# Patient Record
Sex: Male | Born: 1965 | Race: White | Hispanic: No | Marital: Married | Smoking: Current some day smoker
Health system: Southern US, Community
[De-identification: ages and names within clinical notes are randomized; demographics above are authoritative.]

## PROBLEM LIST (undated history)

## (undated) DIAGNOSIS — K759 Inflammatory liver disease, unspecified: Secondary | ICD-10-CM

## (undated) DIAGNOSIS — D709 Neutropenia, unspecified: Secondary | ICD-10-CM

## (undated) DIAGNOSIS — D693 Immune thrombocytopenic purpura: Secondary | ICD-10-CM

## (undated) DIAGNOSIS — K7460 Unspecified cirrhosis of liver: Secondary | ICD-10-CM

## (undated) DIAGNOSIS — E119 Type 2 diabetes mellitus without complications: Secondary | ICD-10-CM

## (undated) HISTORY — PX: LITHOTRIPSY: SUR834

---

## 1999-08-11 ENCOUNTER — Emergency Department (HOSPITAL_COMMUNITY): Admission: EM | Admit: 1999-08-11 | Discharge: 1999-08-11 | Payer: Self-pay | Admitting: Family Medicine

## 2000-01-25 ENCOUNTER — Emergency Department (HOSPITAL_COMMUNITY): Admission: EM | Admit: 2000-01-25 | Discharge: 2000-01-26 | Payer: Self-pay | Admitting: Emergency Medicine

## 2000-01-25 ENCOUNTER — Encounter: Payer: Self-pay | Admitting: Emergency Medicine

## 2004-05-16 ENCOUNTER — Emergency Department (HOSPITAL_COMMUNITY): Admission: EM | Admit: 2004-05-16 | Discharge: 2004-05-16 | Payer: Self-pay | Admitting: Emergency Medicine

## 2014-11-02 DIAGNOSIS — N200 Calculus of kidney: Secondary | ICD-10-CM | POA: Insufficient documentation

## 2016-05-01 ENCOUNTER — Encounter (HOSPITAL_COMMUNITY): Payer: Self-pay

## 2016-05-01 ENCOUNTER — Emergency Department (HOSPITAL_COMMUNITY): Payer: Medicaid Other

## 2016-05-01 ENCOUNTER — Emergency Department (EMERGENCY_DEPARTMENT_HOSPITAL)
Admission: EM | Admit: 2016-05-01 | Discharge: 2016-05-02 | Disposition: A | Discharge status: Home / Self Care | Payer: Medicaid Other | Source: Home / Self Care | Attending: Emergency Medicine | Admitting: Emergency Medicine

## 2016-05-01 DIAGNOSIS — F17200 Nicotine dependence, unspecified, uncomplicated: Secondary | ICD-10-CM

## 2016-05-01 DIAGNOSIS — Z79899 Other long term (current) drug therapy: Secondary | ICD-10-CM | POA: Diagnosis not present

## 2016-05-01 DIAGNOSIS — F1123 Opioid dependence with withdrawal: Secondary | ICD-10-CM | POA: Insufficient documentation

## 2016-05-01 DIAGNOSIS — R109 Unspecified abdominal pain: Secondary | ICD-10-CM | POA: Insufficient documentation

## 2016-05-01 DIAGNOSIS — E119 Type 2 diabetes mellitus without complications: Secondary | ICD-10-CM | POA: Diagnosis not present

## 2016-05-01 DIAGNOSIS — R443 Hallucinations, unspecified: Secondary | ICD-10-CM

## 2016-05-01 DIAGNOSIS — Z7984 Long term (current) use of oral hypoglycemic drugs: Secondary | ICD-10-CM | POA: Diagnosis not present

## 2016-05-01 DIAGNOSIS — F1120 Opioid dependence, uncomplicated: Secondary | ICD-10-CM | POA: Diagnosis present

## 2016-05-01 DIAGNOSIS — Z794 Long term (current) use of insulin: Secondary | ICD-10-CM | POA: Diagnosis not present

## 2016-05-01 DIAGNOSIS — R748 Abnormal levels of other serum enzymes: Secondary | ICD-10-CM | POA: Insufficient documentation

## 2016-05-01 DIAGNOSIS — D696 Thrombocytopenia, unspecified: Secondary | ICD-10-CM | POA: Diagnosis not present

## 2016-05-01 DIAGNOSIS — F1920 Other psychoactive substance dependence, uncomplicated: Secondary | ICD-10-CM | POA: Diagnosis present

## 2016-05-01 DIAGNOSIS — F19951 Other psychoactive substance use, unspecified with psychoactive substance-induced psychotic disorder with hallucinations: Secondary | ICD-10-CM | POA: Diagnosis present

## 2016-05-01 HISTORY — DX: Type 2 diabetes mellitus without complications: E11.9

## 2016-05-01 HISTORY — DX: Unspecified cirrhosis of liver: K74.60

## 2016-05-01 LAB — COMPREHENSIVE METABOLIC PANEL
ALBUMIN: 4.8 g/dL (ref 3.5–5.0)
ALT: 38 U/L (ref 17–63)
AST: 44 U/L — AB (ref 15–41)
Alkaline Phosphatase: 95 U/L (ref 38–126)
Anion gap: 12 (ref 5–15)
BILIRUBIN TOTAL: 1.7 mg/dL — AB (ref 0.3–1.2)
BUN: 22 mg/dL — AB (ref 6–20)
CHLORIDE: 104 mmol/L (ref 101–111)
CO2: 23 mmol/L (ref 22–32)
Calcium: 9.7 mg/dL (ref 8.9–10.3)
Creatinine, Ser: 0.86 mg/dL (ref 0.61–1.24)
GFR calc Af Amer: >60 mL/min (ref >60)
GFR calc non Af Amer: >60 mL/min (ref >60)
GLUCOSE: 99 mg/dL (ref 65–99)
POTASSIUM: 4 mmol/L (ref 3.5–5.1)
Sodium: 139 mmol/L (ref 135–145)
Total Protein: 8.7 g/dL — ABNORMAL HIGH (ref 6.5–8.1)

## 2016-05-01 LAB — CBC
HEMATOCRIT: 36.4 % — AB (ref 39.0–52.0)
Hemoglobin: 12.7 g/dL — ABNORMAL LOW (ref 13.0–17.0)
MCH: 30.8 pg (ref 26.0–34.0)
MCHC: 34.9 g/dL (ref 30.0–36.0)
MCV: 88.3 fL (ref 78.0–100.0)
Platelets: 81 10*3/uL — ABNORMAL LOW (ref 150–400)
RBC: 4.12 MIL/uL — AB (ref 4.22–5.81)
RDW: 13.8 % (ref 11.5–15.5)
WBC: 8.4 10*3/uL (ref 4.0–10.5)

## 2016-05-01 LAB — RAPID URINE DRUG SCREEN, HOSP PERFORMED
AMPHETAMINES: POSITIVE — AB
BARBITURATES: NOT DETECTED
BENZODIAZEPINES: POSITIVE — AB
Cocaine: POSITIVE — AB
Opiates: POSITIVE — AB
TETRAHYDROCANNABINOL: POSITIVE — AB

## 2016-05-01 LAB — ACETAMINOPHEN LEVEL: Acetaminophen (Tylenol), Serum: <10 ug/mL — ABNORMAL LOW (ref 10–30)

## 2016-05-01 LAB — ETHANOL: Alcohol, Ethyl (B): <5 mg/dL (ref <5)

## 2016-05-01 LAB — SALICYLATE LEVEL: Salicylate Lvl: <4 mg/dL (ref 2.8–30.0)

## 2016-05-01 LAB — CBG MONITORING, ED: GLUCOSE-CAPILLARY: 132 mg/dL — AB (ref 65–99)

## 2016-05-01 MED ORDER — LORAZEPAM 1 MG PO TABS
1.0000 mg | ORAL_TABLET | Freq: Once | ORAL | Status: AC
  Administered 2016-05-01: 1 mg via ORAL
  Filled 2016-05-01: qty 1

## 2016-05-01 MED ORDER — IBUPROFEN 200 MG PO TABS
400.0000 mg | ORAL_TABLET | Freq: Once | ORAL | Status: AC
  Administered 2016-05-01: 400 mg via ORAL
  Filled 2016-05-01: qty 2

## 2016-05-01 MED ORDER — INSULIN GLARGINE 100 UNIT/ML ~~LOC~~ SOLN
15.0000 [IU] | Freq: Every day | SUBCUTANEOUS | Status: DC
  Filled 2016-05-01: qty 0.15

## 2016-05-01 MED ORDER — METFORMIN HCL 500 MG PO TABS
1000.0000 mg | ORAL_TABLET | Freq: Two times a day (BID) | ORAL | Status: DC
  Filled 2016-05-01 (×2): qty 2

## 2016-05-01 MED ORDER — ONDANSETRON HCL 4 MG PO TABS
4.0000 mg | ORAL_TABLET | Freq: Once | ORAL | Status: DC
Start: 2016-05-01 — End: 2016-05-02

## 2016-05-01 MED ORDER — BUPROPION HCL 75 MG PO TABS
75.0000 mg | ORAL_TABLET | Freq: Two times a day (BID) | ORAL | Status: DC
Start: 2016-05-01 — End: 2016-05-02
  Administered 2016-05-01 – 2016-05-02 (×2): 75 mg via ORAL
  Filled 2016-05-01 (×2): qty 1

## 2016-05-01 MED ORDER — GABAPENTIN 300 MG PO CAPS: 300.0000 mg | ORAL_CAPSULE | Freq: Every day | ORAL | Status: DC

## 2016-05-01 MED ORDER — ATOMOXETINE HCL 18 MG PO CAPS: 36.0000 mg | ORAL_CAPSULE | Freq: Every day | ORAL | Status: DC

## 2016-05-01 MED ORDER — ATOMOXETINE HCL 18 MG PO CAPS: 18.0000 mg | ORAL_CAPSULE | ORAL | Status: DC

## 2016-05-01 MED ORDER — INSULIN ASPART 100 UNIT/ML ~~LOC~~ SOLN: 2.0000 [IU] | SUBCUTANEOUS | Status: DC

## 2016-05-01 MED ORDER — ATOMOXETINE HCL 18 MG PO CAPS
18.0000 mg | ORAL_CAPSULE | Freq: Every day | ORAL | Status: DC
  Filled 2016-05-01 (×2): qty 1

## 2016-05-01 MED ORDER — ATORVASTATIN CALCIUM 10 MG PO TABS
10.0000 mg | ORAL_TABLET | Freq: Every day | ORAL | Status: DC
  Administered 2016-05-01 – 2016-05-02 (×2): 10 mg via ORAL
  Filled 2016-05-01 (×2): qty 1

## 2016-05-01 MED ORDER — METHOCARBAMOL 500 MG PO TABS
1000.0000 mg | ORAL_TABLET | Freq: Once | ORAL | Status: AC
  Administered 2016-05-01: 1000 mg via ORAL
  Filled 2016-05-01: qty 2

## 2016-05-01 MED ORDER — LINAGLIPTIN 5 MG PO TABS
5.0000 mg | ORAL_TABLET | Freq: Every day | ORAL | Status: DC
  Administered 2016-05-01: 5 mg via ORAL
  Filled 2016-05-01 (×2): qty 1

## 2016-05-01 MED ORDER — DOXEPIN HCL 10 MG PO CAPS
10.0000 mg | ORAL_CAPSULE | Freq: Every day | ORAL | Status: DC
  Administered 2016-05-01: 10 mg via ORAL
  Filled 2016-05-01: qty 1

## 2016-05-01 MED ORDER — METHADONE HCL 10 MG/ML PO CONC
135.0000 mg | Freq: Every day | ORAL | Status: DC
Start: 2016-05-01 — End: 2016-05-01

## 2016-05-01 NOTE — ED Notes (Signed)
Bed: WTR5 Expected date:  Expected time:  Means of arrival:  Comments: EMS-missed methadone-hallucinating
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
## 2016-05-01 NOTE — ED Notes (Signed)
TTS at bedside. 
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
## 2016-05-01 NOTE — ED Notes (Signed)
Pt ambulatory from triage w/o difficulty to room 37
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
## 2016-05-02 ENCOUNTER — Emergency Department (HOSPITAL_COMMUNITY): Payer: Medicaid Other

## 2016-05-02 ENCOUNTER — Encounter (HOSPITAL_COMMUNITY): Payer: Self-pay | Admitting: Emergency Medicine

## 2016-05-02 ENCOUNTER — Emergency Department (HOSPITAL_COMMUNITY)
Admission: EM | Admit: 2016-05-02 | Discharge: 2016-05-03 | Disposition: A | Discharge status: Home / Self Care | Payer: Medicaid Other | Attending: Emergency Medicine | Admitting: Emergency Medicine

## 2016-05-02 DIAGNOSIS — F19951 Other psychoactive substance use, unspecified with psychoactive substance-induced psychotic disorder with hallucinations: Secondary | ICD-10-CM | POA: Diagnosis not present

## 2016-05-02 DIAGNOSIS — F1120 Opioid dependence, uncomplicated: Secondary | ICD-10-CM | POA: Diagnosis present

## 2016-05-02 DIAGNOSIS — F1920 Other psychoactive substance dependence, uncomplicated: Secondary | ICD-10-CM | POA: Diagnosis not present

## 2016-05-02 DIAGNOSIS — F1193 Opioid use, unspecified with withdrawal: Secondary | ICD-10-CM

## 2016-05-02 DIAGNOSIS — F1123 Opioid dependence with withdrawal: Secondary | ICD-10-CM

## 2016-05-02 DIAGNOSIS — R748 Abnormal levels of other serum enzymes: Secondary | ICD-10-CM | POA: Diagnosis present

## 2016-05-02 DIAGNOSIS — D696 Thrombocytopenia, unspecified: Secondary | ICD-10-CM

## 2016-05-02 LAB — COMPREHENSIVE METABOLIC PANEL
ALBUMIN: 4.5 g/dL (ref 3.5–5.0)
ALT: 33 U/L (ref 17–63)
ANION GAP: 11 (ref 5–15)
AST: 43 U/L — ABNORMAL HIGH (ref 15–41)
Alkaline Phosphatase: 88 U/L (ref 38–126)
BILIRUBIN TOTAL: 1.2 mg/dL (ref 0.3–1.2)
BUN: 20 mg/dL (ref 6–20)
CO2: 23 mmol/L (ref 22–32)
Calcium: 9.3 mg/dL (ref 8.9–10.3)
Chloride: 102 mmol/L (ref 101–111)
Creatinine, Ser: 0.71 mg/dL (ref 0.61–1.24)
GFR calc Af Amer: >60 mL/min (ref >60)
GFR calc non Af Amer: >60 mL/min (ref >60)
GLUCOSE: 132 mg/dL — AB (ref 65–99)
POTASSIUM: 3.7 mmol/L (ref 3.5–5.1)
SODIUM: 136 mmol/L (ref 135–145)
TOTAL PROTEIN: 8.1 g/dL (ref 6.5–8.1)

## 2016-05-02 LAB — CBC WITH DIFFERENTIAL/PLATELET
BASOS ABS: 0 10*3/uL (ref 0.0–0.1)
BASOS PCT: 0 %
Eosinophils Absolute: 0 10*3/uL (ref 0.0–0.7)
Eosinophils Relative: 0 %
HEMATOCRIT: 35.9 % — AB (ref 39.0–52.0)
HEMOGLOBIN: 12.6 g/dL — AB (ref 13.0–17.0)
Lymphocytes Relative: 11 %
Lymphs Abs: 0.7 10*3/uL (ref 0.7–4.0)
MCH: 30.5 pg (ref 26.0–34.0)
MCHC: 35.1 g/dL (ref 30.0–36.0)
MCV: 86.9 fL (ref 78.0–100.0)
MONOS PCT: 4 %
Monocytes Absolute: 0.2 10*3/uL (ref 0.1–1.0)
NEUTROS ABS: 5.8 10*3/uL (ref 1.7–7.7)
NEUTROS PCT: 86 %
Platelets: 49 10*3/uL — ABNORMAL LOW (ref 150–400)
RBC: 4.13 MIL/uL — ABNORMAL LOW (ref 4.22–5.81)
RDW: 13.4 % (ref 11.5–15.5)
WBC: 6.8 10*3/uL (ref 4.0–10.5)

## 2016-05-02 LAB — CBG MONITORING, ED
GLUCOSE-CAPILLARY: 93 mg/dL (ref 65–99)
GLUCOSE-CAPILLARY: 139 mg/dL — AB (ref 65–99)

## 2016-05-02 LAB — LIPASE, BLOOD: Lipase: 905 U/L — ABNORMAL HIGH (ref 11–51)

## 2016-05-02 MED ORDER — ONDANSETRON 4 MG PO TBDP
4.0000 mg | ORAL_TABLET | Freq: Once | ORAL | Status: AC
  Administered 2016-05-02: 4 mg via ORAL
  Filled 2016-05-02: qty 1

## 2016-05-02 MED ORDER — SITAGLIPTIN PHOSPHATE 100 MG PO TABS: 100.0000 mg | ORAL_TABLET | Freq: Every day | ORAL | Status: DC

## 2016-05-02 MED ORDER — DICYCLOMINE HCL 10 MG PO CAPS
10.0000 mg | ORAL_CAPSULE | Freq: Once | ORAL | Status: AC
  Administered 2016-05-02: 10 mg via ORAL
  Filled 2016-05-02: qty 1

## 2016-05-02 MED ORDER — INSULIN GLARGINE 100 UNIT/ML ~~LOC~~ SOLN: 15.0000 [IU] | Freq: Every day | SUBCUTANEOUS | Status: DC

## 2016-05-02 MED ORDER — GABAPENTIN 300 MG PO CAPS: 300.0000 mg | ORAL_CAPSULE | Freq: Every day | ORAL | Status: DC

## 2016-05-02 MED ORDER — ATORVASTATIN CALCIUM 10 MG PO TABS: 10.0000 mg | ORAL_TABLET | Freq: Every day | ORAL | Status: DC

## 2016-05-02 MED ORDER — IOPAMIDOL (ISOVUE-300) INJECTION 61%
100.0000 mL | Freq: Once | INTRAVENOUS | Status: AC | PRN
  Administered 2016-05-02: 100 mL via INTRAVENOUS

## 2016-05-02 MED ORDER — METFORMIN HCL 1000 MG PO TABS: 1000.0000 mg | ORAL_TABLET | Freq: Two times a day (BID) | ORAL | Status: DC

## 2016-05-02 MED ORDER — BUPROPION HCL 100 MG PO TABS: 100.0000 mg | ORAL_TABLET | Freq: Every day | ORAL | Status: DC

## 2016-05-02 MED ORDER — SODIUM CHLORIDE 0.9 % IV BOLUS (SEPSIS)
1000.0000 mL | Freq: Once | INTRAVENOUS | Status: AC
  Administered 2016-05-02: 1000 mL via INTRAVENOUS

## 2016-05-02 MED ORDER — BUPROPION HCL 75 MG PO TABS: 75.0000 mg | ORAL_TABLET | Freq: Two times a day (BID) | ORAL | Status: DC

## 2016-05-02 MED ORDER — METHADONE HCL 10 MG/ML PO CONC: 135.0000 mg | Freq: Every day | ORAL | Status: DC

## 2016-05-02 MED ORDER — INSULIN ASPART 100 UNIT/ML ~~LOC~~ SOLN: 2.0000 [IU] | SUBCUTANEOUS | Status: DC

## 2016-05-02 MED ORDER — DOXEPIN HCL 10 MG PO CAPS: 10.0000 mg | ORAL_CAPSULE | Freq: Every day | ORAL | Status: DC

## 2016-05-02 NOTE — ED Notes (Signed)
Pt was given Mal[place]key sandwich and sprite for po challenge.
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
## 2016-05-02 NOTE — ED Provider Notes (Signed)
CSN: [id number]     Arrival date & time 05/02/16  1839 History   First MD Initiated Contact with Patient 05/02/16 1956     Chief Complaint  Patient presents with  . Detox      The history is provided by the patient. No language interpreter was used.   Chase Reed is a 50 y.o. male who presents to the Emergency Department complaining of R[place]. He has a history of taking methadone. His last dose was 3 days ago and he now is in withdrawals. He reports vomiting, abdominal cramping, diarrhea. He tried suboxone today and that made his symptoms worse. He denies any fevers. Denies any SI, HI, hallucinations.  Past Medical History  Diagnosis Date  . Diabetes mellitus without complication (HCC)   . Cirrhosis [facility](HCC)    Past Surgical History  Procedure Laterality Date  . Lithotripsy     No family history on file. Social History  Substance Use Topics  . Smoking status: Current Every Day Smoker  . Smokeless tobacco: None  . Alcohol Use: Yes     Comment: rarely     Review of Systems  All other systems reviewed and are negative.     Allergies  Suboxone  Home Medications   Prior to Admission medications   Medication Sig Start Date End Date Taking? Authorizing Provider  atorvastatin (LIPITOR) 10 MG tablet Take 1 tablet (10 mg total) by mouth daily. 05/02/16  Yes Beau FannyJohn C Withrow, FNP  buprenorphine-naloxone (SUBOXONE) 8-2 MG SUBL SL tablet Place 1 tablet under the tongue daily.   Yes Historical Provider, MD  buPROPion (WELLBUTRIN) 75 MG tablet Take 1 tablet (75 mg total) by mouth 2 (two) times daily. 05/02/16  Yes Beau FannyJohn C Withrow, FNP  doxepin (SINEQUAN) 10 MG capsule Take 1 capsule (10 mg total) by mouth at bedtime. 05/02/16  Yes Beau FannyJohn C Withrow, FNP  gabapentin (NEURONTIN) 300 MG capsule Take 1 capsule (300 mg total) by mouth at bedtime. 05/02/16  Yes Beau FannyJohn C Withrow, FNP  metFORMIN (GLUCOPHAGE) 1000 MG tablet Take 1 tablet (1,000 mg total) by mouth 2 (two) times daily with a meal. 05/02/16   Yes Beau FannyJohn C Withrow, FNP  methadone (DOLOPHINE) 10 MG/ML solution Take 13.5 mLs (135 mg total) by mouth daily. Per methadone clinic 05/02/16  Yes Beau FannyJohn C Withrow, FNP  sitaGLIPtin (JANUVIA) 100 MG tablet Take 1 tablet (100 mg total) by mouth daily. 05/02/16  Yes Beau FannyJohn C Withrow, FNP  insulin aspart (NOVOLOG) 100 UNIT/ML injection Inject 2-8 Units into the skin See admin instructions. Sliding scale.over 250--2 units over 300--4 units over 350--6 units over 400--8 units Up to 50 units daily. 05/02/16   Beau FannyJohn C Withrow, FNP  insulin glargine (LANTUS) 100 UNIT/ML injection Inject 0.15 mLs (15 Units total) into the skin daily. (Maximum of 45 units daily.) 05/02/16   Beau FannyJohn C Withrow, FNP   BP 143/67 mmHg  Pulse 60  Temp(Src) 98.6 F (37 C) (Oral)  Resp 18  SpO2 99% Physical Exam  Constitutional: He is oriented to person, place, and time. He appears well-developed and well-nourished.  HENT:  Head: Normocephalic and atraumatic.  Cardiovascular: Normal rate and regular rhythm.   No murmur heard. Pulmonary/Chest: Effort normal and breath sounds normal. No respiratory distress.  Abdominal: Soft. There is no rebound and no guarding.  Mild abdominal tenderness  Musculoskeletal: He exhibits no edema or tenderness.  Neurological: He is alert and oriented to person, place, and time.  Skin: Skin is warm and dry.  Psychiatric: He has a normal mood and affect. His behavior is normal.  Nursing note and vitals reviewed.   ED Course  Procedures (including critical care time) Labs Review Labs Reviewed  COMPREHENSIVE METABOLIC PANEL - Abnormal; Notable for the following:    Glucose, Bld 132 (*)    AST 43 (*)    All other components within normal limits  CBC WITH DIFFERENTIAL/PLATELET - Abnormal; Notable for the following:    RBC 4.13 (*)    Hemoglobin 12.6 (*)    HCT 35.9 (*)    Platelets 49 (*)    All other components within normal limits  LIPASE, BLOOD    Imaging Review Ct Head Wo  Contrast  05/01/2016  CLINICAL DATA:  Confusion.  Hallucinations. EXAM: CT HEAD WITHOUT CONTRAST TECHNIQUE: Contiguous axial images were obtained from the base of the skull through the vertex without intravenous contrast. COMPARISON:  None. FINDINGS: Paranasal sinuses, mastoid air cells, and bones are normal. Extracranial soft tissues are within normal limits. No subdural, epidural, or subarachnoid hemorrhage. The cerebellum, brainstem, and basal cisterns are normal. Ventricles and sulci are normal. No acute cortical ischemia or infarct. No mass, mass effect, or midline shift. IMPRESSION: Normal.  No cause for hallucinations identified. Electronically Signed   By: Gerome Samavid  Williams III M.D   On: 05/01/2016 19:57   I have personally reviewed and evaluated these images and lab results as part of my medical decision-making.   EKG Interpretation None      MDM   Final diagnoses:  Narcotic withdrawal (HCC)  Elevated lipase  Thrombocytopenia (HCC)    Patient here for evaluation of symptoms of withdrawal from methadone. He has been having vomiting, abdominal cramping, diarrhea. He was recently seen and discharged from behavioral health today. He denies any SI, HI, hallucinations. He has thrombocytopenia. He states he has a history of such but does not know his baseline platelet count. Today's platelet count is slightly less than yesterday. He has no active bleeding on examination or by history. He denies any rashes, hematochezia, melena.  Lipase is elevated concerning for acute pancreatitis. Plan to obtain CT abdomen given his thrombocytopenia and elevated lipase.   CT scan without any pancreatic inflammation and pt without significant abdominal tenderness, tolerating oral fluids in the department.  Plan to d/c home with close outpatient follow up and return precautions.      Tilden FossaElizabeth Everest Hacking, MD 05/03/16 1123

## 2016-05-02 NOTE — ED Notes (Signed)
Pt is alert and oriented x4. Denies SI/HI. Denies AVH; does not appear to be responding to internal stimuli and is not delusional. States that he wants to go home, and has a friend picking him up with whom he will reside for now. Escorted by staff to the exit. All belongings returned to pt who signed for same.

## 2016-05-02 NOTE — Discharge Instructions (Signed)
For your ongoing mental health needs, you are advised to follow up with [facility].  If you do not currently have an appointment, new and returning patients are seen at their walk-in clinic.  Walk-in hours are Monday - Friday from 8:00 am - 3:00 pm.  Walk-in patients are seen on a first come, first served basis.  Try to arrive as early as possible for he best chance of being seen the same day: ° °     [facility] °     [address] °     [place], [place] [address] °     [phone] °

## 2016-05-02 NOTE — BH Assessment (Signed)
[facility] Assessment Progress Note  Per Thedore MinsMojeed Akintayo, MD, this pt does not require psychiatric hospitalization at this time.  [facility]  This has been included in pt's discharge instructions.  Pt's nurse, Diane, has been notified.  Doylene Canninghomas Latreshia Beauchaine, MA Triage Specialist [phone]

## 2016-05-02 NOTE — ED Notes (Addendum)
Per EMS pt request evaluation from methadone withdrawal last use 3 days ago; discharged today for same. Pt reports "threw up suboxone."

## 2016-05-02 NOTE — Progress Notes (Addendum)
Medicaid [place] access response hx indicates the [facility] [address] [place] [phone]   Entered in d/c instructions  [facility] Schedule an appointment as soon as possible for a visit This is your assigned Medicaid [place] access doctor If you prefer to see another Medicaid doctor other than the one on your Medicaid card PLEASE CALL YOUR DSS   Medicaid  access response hx indicates the [facility] [address] [place], [place] [phone]   Medicaid  Access Covered Patient On 05/02/2016 [email or web address]/ Use this website to assist with understanding your coverage & to renew application As a Medicaid client you MUST contact DSS/SSI each time you change address, move to another [place] county or another state to keep your address updated  [place] Medicaid Transportation to Dr appts if you are have full Medicaid: call your local DSS office

## 2016-05-02 NOTE — ED Notes (Signed)
This morning pt's CBG = 94 and he said that is low for him. Therefore he declined to take the metformin at this time. He said that he knows when is sugar is high and will take it then. He felt that he needs sugar at this time and asked for 4-5 sugar packs for his coffee. He is focused at present on his need to have methadone. Deneis SI/HI. Denies AVH. Has not expressed any paranoid thoughts.

## 2016-05-03 MED ORDER — PROMETHAZINE HCL 25 MG PO TABS: 25.0000 mg | ORAL_TABLET | Freq: Four times a day (QID) | ORAL | Status: DC | PRN

## 2016-05-03 MED ORDER — DICYCLOMINE HCL 20 MG PO TABS: 20.0000 mg | ORAL_TABLET | Freq: Two times a day (BID) | ORAL | Status: DC

## 2016-05-03 NOTE — Discharge Instructions (Signed)
Your lipase was elevated today (a pancreatic enzyme).  This will need to be rechecked by your family doctor in the next week.  Get rechecked if you develop any fever, severe abdominal pain, or new/worrisome symptoms.  Chemical Dependency Chemical dependency is an addiction to drugs or alcohol. It is characterized by the repeated behavior of seeking out and using drugs and alcohol despite harmful consequences to the health and safety of ones self and others.  RISK FACTORS There are certain situations or behaviors that increase a person's risk for chemical dependency. These include:  A family history of chemical dependency.  A history of mental health issues, including depression and anxiety.  A home environment where drugs and alcohol are easily available to you.  Drug or alcohol use at a young age. SYMPTOMS  The following symptoms can indicate chemical dependency:  Inability to limit the use of drugs or alcohol.  Nausea, sweating, shakiness, and anxiety that occurs when alcohol or drugs are not being used.  An increase in amount of drugs or alcohol that is necessary to get drunk or high. People who experience these symptoms can assess their use of drugs and alcohol by asking themselves the following questions:  Have you been told by friends or family that they are worried about your use of alcohol or drugs?  Do friends and family ever tell you about things you did while drinking alcohol or using drugs that you do not remember?  Do you lie about using alcohol or drugs or about the amounts you use?  Do you have difficulty completing daily tasks unless you use alcohol or drugs?  Is the level of your work or school performance lower because of your drug or alcohol use?  Do you get sick from using drugs or alcohol but keep using anyway?  Do you feel uncomfortable in social situations unless you use alcohol or drugs?  Do you use drugs or alcohol to help forget problems? An answer of  yes to any of these questions may indicate chemical dependency. Professional evaluation is suggested.   This information is not intended to replace advice given to you by your health care provider. Make sure you discuss any questions you have with your health care provider.   Document Released: 10/14/2001 Document Revised: 01/12/2012 Document Reviewed: 12/26/2010 Elsevier Interactive Patient Education 2016 [employer]  Thrombocytopenia Thrombocytopenia is a condition in which there is an abnormally small number of platelets in your blood. Platelets are also called thrombocytes. Platelets are needed for blood clotting. CAUSES Thrombocytopenia is caused by:   Decreased production of platelets. This can be caused by:  Aplastic anemia in which your bone marrow quits making blood cells.  Cancer in the bone marrow.  Use of certain medicines, including chemotherapy.  Infection in the bone marrow.  Heavy alcohol consumption.  Increased destruction of platelets. This can be caused by:  Certain immune diseases.  Use of certain drugs.  Certain blood clotting disorders.  Certain inherited disorders.  Certain bleeding disorders.  Pregnancy.  Having an enlarged spleen (hypersplenism). In hypersplenism, the spleen gathers up platelets from circulation. This means the platelets are not available to help with blood clotting. The spleen can enlarge due to cirrhosis or other conditions. SYMPTOMS  The symptoms of thrombocytopenia are side effects of poor blood clotting. Some of these are:  Abnormal bleeding.  Nosebleeds.  Heavy menstrual periods.  Blood in the urine or stools.  Purpura. This is a purplish discoloration in the skin produced by  small bleeding vessels near the surface of the skin.  Bruising.  A rash that may be petechial. This looks like pinpoint, purplish-red spots on the skin and mucous membranes. It is caused by bleeding from small blood vessels  (capillaries). DIAGNOSIS  Your caregiver will make this diagnosis based on your exam and blood tests. Sometimes, a bone marrow study is done to look for the original cells (megakaryocytes) that make platelets. TREATMENT  Treatment depends on the cause of the condition.  Medicines may be given to help protect your platelets from being destroyed.  In some cases, a replacement (transfusion) of platelets may be required to stop or prevent bleeding.  Sometimes, the spleen must be surgically removed. HOME CARE INSTRUCTIONS   Check the skin and linings inside your mouth for bruising or bleeding as directed by your caregiver.  Check your sputum, urine, and stool for blood as directed by your caregiver.  Do not return to any activities that could cause bumps or bruises until your caregiver says it is okay.  Take extra care not to cut yourself when shaving or when using scissors, needles, knives, and other tools.  Take extra care not to burn yourself when ironing or cooking.  Ask your caregiver if it is okay for you to drink alcohol.  Only take over-the-counter or prescription medicines as directed by your caregiver.  Notify all your caregivers, including dentists and eye doctors, about your condition. SEEK IMMEDIATE MEDICAL CARE IF:   You develop active bleeding from anywhere in your body.  You develop unexplained bruising or bleeding.  You have blood in your sputum, urine, or stool. MAKE SURE YOU:  Understand these instructions.  Will watch your condition.  Will get help right away if you are not doing well or get worse.   This information is not intended to replace advice given to you by your health care provider. Make sure you discuss any questions you have with your health care provider.   Document Released: 10/20/2005 Document Revised: 01/12/2012 Document Reviewed: 04/23/2015 Elsevier Interactive Patient Education [employer] Elsevier Inc.

## 2016-05-03 NOTE — ED Notes (Signed)
Pt able to tolerate po challenge---- able to drink sprite and eat graham crackers without vomiting.

## 2016-05-12 MED FILL — SUBOXONE 8 MG-2 MG SL FILM: 8-2 | 7 days supply | Qty: 14 | Fill #0

## 2016-05-19 MED FILL — SUBOXONE 8 MG-2 MG SL FILM: 8-2 | 14 days supply | Qty: 42 | Fill #0

## 2016-06-02 MED FILL — SUBOXONE 8 MG-2 MG SL FILM: 8-2 | 14 days supply | Qty: 42 | Fill #0

## 2016-06-13 MED FILL — SUBOXONE 8 MG-2 MG SL FILM: 8-2 | 14 days supply | Qty: 42 | Fill #0

## 2016-06-27 MED FILL — SUBOXONE 8 MG-2 MG SL FILM: 8-2 | 7 days supply | Qty: 21 | Fill #0

## 2016-07-04 MED FILL — SUBOXONE 8 MG-2 MG SL FILM: 8-2 | 14 days supply | Qty: 42 | Fill #0

## 2016-07-18 MED FILL — SUBOXONE 8 MG-2 MG SL FILM: 8-2 | 7 days supply | Qty: 21 | Fill #0

## 2016-07-25 MED FILL — SUBOXONE 8 MG-2 MG SL FILM: 8-2 | 7 days supply | Qty: 21 | Fill #0

## 2016-08-01 MED FILL — SUBOXONE 8 MG-2 MG SL FILM: 8-2 | 7 days supply | Qty: 21 | Fill #0

## 2016-08-08 MED FILL — NARCAN 4 MG NASAL SPRAY: 4 | 2 days supply | Qty: 2 | Fill #0

## 2016-08-08 MED FILL — SUBOXONE 8 MG-2 MG SL FILM: 8-2 | 7 days supply | Qty: 21 | Fill #0

## 2016-08-14 MED FILL — SUBOXONE 8 MG-2 MG SL FILM: 8-2 | 7 days supply | Qty: 21 | Fill #0

## 2016-08-21 MED FILL — SUBOXONE 8 MG-2 MG SL FILM: 8-2 | 7 days supply | Qty: 21 | Fill #0

## 2016-08-28 MED FILL — SUBOXONE 8 MG-2 MG SL FILM: 8-2 | 7 days supply | Qty: 21 | Fill #0

## 2016-09-04 MED FILL — SUBOXONE 8 MG-2 MG SL FILM: 8-2 | 7 days supply | Qty: 21 | Fill #0

## 2016-09-11 MED FILL — SUBOXONE 8 MG-2 MG SL FILM: 8-2 | 7 days supply | Qty: 21 | Fill #0

## 2016-12-20 ENCOUNTER — Encounter (HOSPITAL_COMMUNITY): Payer: Self-pay | Admitting: Emergency Medicine

## 2016-12-20 ENCOUNTER — Emergency Department (HOSPITAL_COMMUNITY)
Admission: EM | Admit: 2016-12-20 | Discharge: 2016-12-21 | Disposition: A | Discharge status: Home / Self Care | Payer: MEDICAID | Attending: Emergency Medicine | Admitting: Emergency Medicine

## 2016-12-20 DIAGNOSIS — Z794 Long term (current) use of insulin: Secondary | ICD-10-CM | POA: Diagnosis not present

## 2016-12-20 DIAGNOSIS — E119 Type 2 diabetes mellitus without complications: Secondary | ICD-10-CM | POA: Insufficient documentation

## 2016-12-20 DIAGNOSIS — Z79899 Other long term (current) drug therapy: Secondary | ICD-10-CM | POA: Diagnosis not present

## 2016-12-20 DIAGNOSIS — F17200 Nicotine dependence, unspecified, uncomplicated: Secondary | ICD-10-CM | POA: Diagnosis not present

## 2016-12-20 DIAGNOSIS — F329 Major depressive disorder, single episode, unspecified: Secondary | ICD-10-CM

## 2016-12-20 DIAGNOSIS — F32A Depression, unspecified: Secondary | ICD-10-CM

## 2016-12-20 LAB — COMPREHENSIVE METABOLIC PANEL
ALT: 20 U/L (ref 17–63)
AST: 22 U/L (ref 15–41)
Albumin: 3.6 g/dL (ref 3.5–5.0)
Alkaline Phosphatase: 93 U/L (ref 38–126)
Anion gap: 8 (ref 5–15)
BUN: 17 mg/dL (ref 6–20)
CHLORIDE: 100 mmol/L — AB (ref 101–111)
CO2: 27 mmol/L (ref 22–32)
CREATININE: 0.97 mg/dL (ref 0.61–1.24)
Calcium: 9.2 mg/dL (ref 8.9–10.3)
GFR calc Af Amer: >60 mL/min (ref >60)
Glucose, Bld: 382 mg/dL — ABNORMAL HIGH (ref 65–99)
Potassium: 4.4 mmol/L (ref 3.5–5.1)
Sodium: 135 mmol/L (ref 135–145)
Total Bilirubin: 0.6 mg/dL (ref 0.3–1.2)
Total Protein: 7 g/dL (ref 6.5–8.1)

## 2016-12-20 LAB — RAPID URINE DRUG SCREEN, HOSP PERFORMED
Amphetamines: NOT DETECTED
Barbiturates: NOT DETECTED
Benzodiazepines: NOT DETECTED
Cocaine: POSITIVE — AB
Opiates: POSITIVE — AB
TETRAHYDROCANNABINOL: NOT DETECTED

## 2016-12-20 LAB — CBC
HEMATOCRIT: 36.4 % — AB (ref 39.0–52.0)
HEMOGLOBIN: 12.9 g/dL — AB (ref 13.0–17.0)
MCH: 30.3 pg (ref 26.0–34.0)
MCHC: 35.4 g/dL (ref 30.0–36.0)
MCV: 85.4 fL (ref 78.0–100.0)
PLATELETS: 48 10*3/uL — AB (ref 150–400)
RBC: 4.26 MIL/uL (ref 4.22–5.81)
RDW: 13.1 % (ref 11.5–15.5)
WBC: 3 10*3/uL — ABNORMAL LOW (ref 4.0–10.5)

## 2016-12-20 LAB — ACETAMINOPHEN LEVEL: Acetaminophen (Tylenol), Serum: <10 ug/mL — ABNORMAL LOW (ref 10–30)

## 2016-12-20 LAB — SALICYLATE LEVEL: Salicylate Lvl: <7 mg/dL (ref 2.8–30.0)

## 2016-12-20 LAB — CBG MONITORING, ED: Glucose-Capillary: 225 mg/dL — ABNORMAL HIGH (ref 65–99)

## 2016-12-20 LAB — ETHANOL

## 2016-12-20 MED ORDER — CLONIDINE HCL 0.1 MG PO TABS
0.1000 mg | ORAL_TABLET | Freq: Once | ORAL | Status: AC
  Administered 2016-12-20: 0.1 mg via ORAL
  Filled 2016-12-20: qty 1

## 2016-12-20 MED ORDER — ONDANSETRON 4 MG PO TBDP
4.0000 mg | ORAL_TABLET | Freq: Once | ORAL | Status: AC
  Administered 2016-12-20: 4 mg via ORAL
  Filled 2016-12-20: qty 1

## 2016-12-20 MED ORDER — METHOCARBAMOL 500 MG PO TABS
750.0000 mg | ORAL_TABLET | Freq: Once | ORAL | Status: AC
  Administered 2016-12-20: 750 mg via ORAL
  Filled 2016-12-20: qty 2

## 2016-12-20 NOTE — ED Notes (Signed)
[facility] Expected date:  Expected time:  Means of arrival:  [facility]
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
## 2016-12-20 NOTE — ED Triage Notes (Signed)
Pt here for MEDICAL [facility]. Pt denies SI/HI or A/VH.

## 2016-12-20 NOTE — ED Notes (Signed)
Pt ambulated to bathroom without assistance 

## 2016-12-20 NOTE — ED Notes (Signed)
Pt ambulated to the bathroom, to attempt to get a urine  specimen

## 2016-12-20 NOTE — H&P (Signed)
Behavioral Health Medical Screening Exam  Chase Reed is an 51 y.o. male.  Total Time spent with patient: 20 minutes  Psychiatric Specialty Exam: Physical Exam  Constitutional: He is oriented to person, place, and time. He appears well-developed and well-nourished.  HENT:  Head: Normocephalic.  Eyes: Conjunctivae and EOM are normal.  Neck: Normal range of motion.  Cardiovascular: Normal rate and normal heart sounds.   Respiratory: Effort normal and breath sounds normal.  GI: Soft. Bowel sounds are normal.  Musculoskeletal: Normal range of motion.  Neurological: He is alert and oriented to person, place, and time.  Skin: Skin is warm and dry.    Review of Systems  Psychiatric/Behavioral: Positive for depression and substance abuse. Negative for hallucinations and memory loss. The patient is not nervous/anxious and does not have insomnia.   All other systems reviewed and are negative.   Blood pressure 132/85, pulse 87, temperature 97.8 F (36.6 C), temperature source Oral, resp. rate 16, SpO2 98 %.There is no height or weight on file to calculate BMI.  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  without intent/plan  Homicidal Thoughts:  No  Memory:  Immediate;   Good Recent;   Good Remote;   Fair  Judgement:  Good  Insight:  Good  Psychomotor Activity:  Normal  Concentration: Concentration: Good and Attention Span: Good  Recall:  Good  Fund of Knowledge:Good  Language: Good  Akathisia:  No  Handed:  Right  AIMS (if indicated):     Assets:  Communication Skills Desire for  Improvement Financial Resources/Insurance Resilience Social Support Vocational/Educational  Sleep:       Musculoskeletal: Strength & Muscle Tone: within normal limits Gait & Station: normal Patient leans: N/A  Blood pressure 132/85, pulse 87, temperature 97.8 F (36.6 C), temperature source Oral, resp. rate 16, SpO2 98 %.  Recommendations:  Based on my evaluation the patient does not appear to have an emergency medical condition.  Pt meets criteria for inpatient psychiatric admission pending medical clearance.  Laveda AbbeLaurie Britton Parks, NP 12/20/2016, 3:35 PM

## 2016-12-20 NOTE — Discharge Instructions (Signed)
[facility]

## 2016-12-20 NOTE — ED Provider Notes (Signed)
[facility]-EMERGENCY DEPT Provider Note   CSN: [id number] Arrival date & time: 12/20/16  1644     History   Chief Complaint Chief Complaint  Patient presents with  . Medical Clearance     HPI   Blood pressure 133/91, pulse 85, temperature 97.4 F (36.3 C), temperature source Oral, resp. rate 16, weight 90.7 kg, SpO2 98 %.  Chase Reed is a 51 y.o. male resenting for medical clearance. Patient was sent from behavioral health and they recommend inpatient treatment for heroin abuse and depression. Patient states he last injected heroin this morning. He states he feels slightly stiff but no other issues at this time. He is an insulin-dependent diabetic and has been compliant with his medications. He denies any suicidal ideation, homicidal ideation, auditory or visual hallucinations, history of alcohol abuse. Chart says there is a history of cirrhosis but patient denies this. Denies nausea, vomiting, focal chest or abdominal pain, change in bowel or bladder habits or muscle aches and pains.  Past Medical History:  Diagnosis Date  . Cirrhosis (HCC)   . Diabetes mellitus without complication [facility])     Patient Active Problem List   Diagnosis Date Noted  . Polysubstance (including opioids) dependence w/o physiol dependence (HCC) 05/02/2016  . 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 Elevated lipase 05/02/2016    Past Surgical History:  Procedure Laterality Date  . LITHOTRIPSY         Home Medications    Prior to Admission medications   Medication Sig Start Date End Date Taking? Authorizing Provider  atorvastatin (LIPITOR) 10 MG tablet Take 1 tablet (10 mg total) by mouth daily. 05/02/16   Beau Fanny, FNP  buprenorphine-naloxone (SUBOXONE) 8-2 MG SUBL SL tablet Place 1 tablet under the tongue daily.    Historical Provider, MD  buPROPion (WELLBUTRIN) 75 MG tablet Take 1 tablet (75 mg total) by mouth 2 (two) times daily. 05/02/16   Beau Fanny, FNP  dicyclomine (BENTYL) 20 MG tablet Take 1 tablet (20 mg total) by mouth 2 (two) times daily. 05/03/16   Tilden Fossa, MD  doxepin (SINEQUAN) 10 MG capsule Take 1 capsule (10 mg total) by mouth at bedtime. 05/02/16   Beau Fanny, FNP  gabapentin (NEURONTIN) 300 MG capsule Take 1 capsule (300 mg total) by mouth at bedtime. 05/02/16   Beau Fanny, FNP  insulin aspart (NOVOLOG) 100 UNIT/ML injection Inject 2-8 Units into the skin See admin instructions. Sliding scale.over 250--2 units over 300--4 units over 350--6 units over 400--8 units Up to 50 units daily. 05/02/16   Beau Fanny, FNP  insulin glargine (LANTUS) 100 UNIT/ML injection Inject 0.15 mLs (15 Units total) into the skin daily. (Maximum of 45 units daily.) 05/02/16   Beau Fanny, FNP  metFORMIN (GLUCOPHAGE) 1000 MG tablet Take 1 tablet (1,000 mg total) by mouth 2 (two) times daily with a meal. 05/02/16   Beau Fanny, FNP  methadone (DOLOPHINE) 10 MG/ML solution Take 13.5 mLs (135 mg total) by mouth daily. Per methadone clinic 05/02/16   Beau Fanny, FNP  promethazine (PHENERGAN) 25 MG tablet Take 1 tablet (25 mg total) by mouth every 6 (six) hours as needed for nausea or vomiting. 05/03/16   Tilden Fossa, MD  sitaGLIPtin (JANUVIA) 100 MG tablet Take 1 tablet (100 mg total) by mouth daily. 05/02/16   Beau Fanny, FNP    Family History No family history on file.  Social History Social History  Substance  Use Topics  . Smoking status: Current Every Day Smoker  . Smokeless tobacco: Not on file  . Alcohol use Yes     Comment: rarely      Allergies   Suboxone [buprenorphine hcl-naloxone hcl]   Review of Systems Review of Systems  10 systems reviewed and found to be negative, except as noted in the HPI.  Physical Exam Updated Vital Signs BP 133/91 (BP Location: Left Arm)   Pulse 85   Temp 97.4 F (36.3 C) (Oral)   Resp 16   Wt 90.7 kg   SpO2 98%   Physical Exam  Constitutional: He is oriented  to person, place, and time. He appears well-developed and well-nourished. No distress.  HENT:  Head: Normocephalic and atraumatic.  Mouth/Throat: Oropharynx is clear and moist.  Eyes: Conjunctivae and EOM are normal. Pupils are equal, round, and reactive to light.  Neck: Normal range of motion.  Cardiovascular: Normal rate, regular rhythm and intact distal pulses.   Pulmonary/Chest: Effort normal and breath sounds normal. No respiratory distress. He has no wheezes. He has no rales. He exhibits no tenderness.  Abdominal: Soft. He exhibits no distension and no mass. There is no tenderness. There is no rebound and no guarding. No hernia.  Musculoskeletal: Normal range of motion.  Neurological: He is alert and oriented to person, place, and time.  Skin: Capillary refill takes less than 2 seconds. He is not diaphoretic.  Psychiatric: He has a normal mood and affect.  Nursing note and vitals reviewed.    ED Treatments / Results  Labs (all labs ordered are listed, but only abnormal results are displayed) Labs Reviewed  COMPREHENSIVE METABOLIC PANEL - Abnormal; Notable for the following:       Result Value   Chloride 100 (*)    Glucose, Bld 382 (*)    All other components within normal limits  ACETAMINOPHEN LEVEL - Abnormal; Notable for the following:    Acetaminophen (Tylenol), Serum <10 (*)    All other components within normal limits  CBC - Abnormal; Notable for the following:    WBC 3.0 (*)    Hemoglobin 12.9 (*)    HCT 36.4 (*)    Platelets 48 (*)    All other components within normal limits  RAPID URINE DRUG SCREEN, HOSP PERFORMED - Abnormal; Notable for the following:    Opiates POSITIVE (*)    Cocaine POSITIVE (*)    All other components within normal limits  CBG MONITORING, ED - Abnormal; Notable for the following:    Glucose-Capillary 225 (*)    All other components within normal limits  ETHANOL  SALICYLATE LEVEL    EKG  EKG Interpretation None        Radiology No results found.  Procedures Procedures (including critical care time)  Medications Ordered in ED Medications  cloNIDine (CATAPRES) tablet 0.1 mg (not administered)  ondansetron (ZOFRAN-ODT) disintegrating tablet 4 mg (not administered)  methocarbamol (ROBAXIN) tablet 750 mg (not administered)     Initial Impression / Assessment and Plan / ED Course  I have reviewed the triage vital signs and the nursing notes.  Pertinent labs & imaging results that were available during my care of the patient were reviewed by me and considered in my medical decision making (see chart for details).     Vitals:   12/20/16 1652  BP: 133/91  Pulse: 85  Resp: 16  Temp: 97.4 F (36.3 C)  TempSrc: Oral  SpO2: 98%  Weight: 90.7 kg  Medications  cloNIDine (CATAPRES) tablet 0.1 mg (not administered)  ondansetron (ZOFRAN-ODT) disintegrating tablet 4 mg (not administered)  methocarbamol (ROBAXIN) tablet 750 mg (not administered)    Chase Reed is 51 y.o. male presenting with Depression and IV heroin use. [facility], patient is not grossly psychotic. Not actively withdrawing.  Glucose is elevated at 382 however he has a normal anion gap, Recheck of blood sugar has improved to 225.  Patient is medically cleared for psychiatric evaluation [facility]    At transfer it appears the before meals is not comfortable with managing her blood sugar of 225 via sliding scale, Dr. Freida BusmanAllen to discuss with psychiatrist on call. Patient feels that he may be withdrawing, will give him clonidine, Zofran and Robaxin.   Final Clinical Impressions(s) / ED Diagnoses   Final diagnoses:  Depression, unspecified depression type     New Prescriptions New Prescriptions   No medications on file     Wynetta Emeryicole Eliya Bubar, PA-C 12/20/16 2056    [facility]ole Sarye Kath, PA-C 12/20/16 2201    Lorre NickAnthony Allen, MD 12/20/16 [phone]

## 2016-12-20 NOTE — ED Notes (Signed)
[facility] dispatch called, [facility]

## 2016-12-20 NOTE — ED Notes (Addendum)
[facility] [place] [facility] Pt unable to transfer tonight due to elevated glucose of 382. Due to policy he is unable to be admitted at this time, may be eligible for transfer tomorrow.

## 2016-12-21 ENCOUNTER — Inpatient Hospital Stay (HOSPITAL_COMMUNITY)
Admission: RE | Admit: 2016-12-21 | Discharge: 2016-12-25 | DRG: 897 | Disposition: A | Discharge status: Home / Self Care | Payer: MEDICAID | Attending: Psychiatry | Admitting: Psychiatry

## 2016-12-21 ENCOUNTER — Encounter (HOSPITAL_COMMUNITY): Payer: Self-pay

## 2016-12-21 DIAGNOSIS — E1142 Type 2 diabetes mellitus with diabetic polyneuropathy: Secondary | ICD-10-CM | POA: Diagnosis present

## 2016-12-21 DIAGNOSIS — Z590 Homelessness: Secondary | ICD-10-CM | POA: Diagnosis not present

## 2016-12-21 DIAGNOSIS — E7800 Pure hypercholesterolemia, unspecified: Secondary | ICD-10-CM | POA: Diagnosis present

## 2016-12-21 DIAGNOSIS — F1429 Cocaine dependence with unspecified cocaine-induced disorder: Secondary | ICD-10-CM | POA: Diagnosis present

## 2016-12-21 DIAGNOSIS — I10 Essential (primary) hypertension: Secondary | ICD-10-CM | POA: Diagnosis present

## 2016-12-21 DIAGNOSIS — Z888 Allergy status to other drugs, medicaments and biological substances status: Secondary | ICD-10-CM | POA: Diagnosis not present

## 2016-12-21 DIAGNOSIS — Z811 Family history of alcohol abuse and dependence: Secondary | ICD-10-CM | POA: Diagnosis not present

## 2016-12-21 DIAGNOSIS — I7090 Unspecified atherosclerosis: Secondary | ICD-10-CM | POA: Diagnosis present

## 2016-12-21 DIAGNOSIS — F1193 Opioid use, unspecified with withdrawal: Secondary | ICD-10-CM | POA: Diagnosis present

## 2016-12-21 DIAGNOSIS — Z638 Other specified problems related to primary support group: Secondary | ICD-10-CM

## 2016-12-21 DIAGNOSIS — E1165 Type 2 diabetes mellitus with hyperglycemia: Secondary | ICD-10-CM | POA: Diagnosis present

## 2016-12-21 DIAGNOSIS — F17210 Nicotine dependence, cigarettes, uncomplicated: Secondary | ICD-10-CM | POA: Diagnosis present

## 2016-12-21 DIAGNOSIS — D61818 Other pancytopenia: Secondary | ICD-10-CM | POA: Diagnosis present

## 2016-12-21 DIAGNOSIS — G4700 Insomnia, unspecified: Secondary | ICD-10-CM | POA: Diagnosis present

## 2016-12-21 DIAGNOSIS — F1114 Opioid abuse with opioid-induced mood disorder: Secondary | ICD-10-CM | POA: Diagnosis present

## 2016-12-21 DIAGNOSIS — Z560 Unemployment, unspecified: Secondary | ICD-10-CM | POA: Diagnosis not present

## 2016-12-21 DIAGNOSIS — F314 Bipolar disorder, current episode depressed, severe, without psychotic features: Secondary | ICD-10-CM

## 2016-12-21 DIAGNOSIS — Z79899 Other long term (current) drug therapy: Secondary | ICD-10-CM

## 2016-12-21 DIAGNOSIS — Z794 Long term (current) use of insulin: Secondary | ICD-10-CM | POA: Diagnosis not present

## 2016-12-21 DIAGNOSIS — F1994 Other psychoactive substance use, unspecified with psychoactive substance-induced mood disorder: Secondary | ICD-10-CM | POA: Diagnosis present

## 2016-12-21 DIAGNOSIS — F1123 Opioid dependence with withdrawal: Secondary | ICD-10-CM | POA: Diagnosis present

## 2016-12-21 DIAGNOSIS — K7460 Unspecified cirrhosis of liver: Secondary | ICD-10-CM | POA: Diagnosis present

## 2016-12-21 DIAGNOSIS — B356 Tinea cruris: Secondary | ICD-10-CM | POA: Diagnosis present

## 2016-12-21 DIAGNOSIS — F411 Generalized anxiety disorder: Secondary | ICD-10-CM | POA: Diagnosis present

## 2016-12-21 HISTORY — DX: Inflammatory liver disease, unspecified: K75.9

## 2016-12-21 LAB — GLUCOSE, CAPILLARY
GLUCOSE-CAPILLARY: 165 mg/dL — AB (ref 65–99)
GLUCOSE-CAPILLARY: 205 mg/dL — AB (ref 65–99)
GLUCOSE-CAPILLARY: 259 mg/dL — AB (ref 65–99)
GLUCOSE-CAPILLARY: 195 mg/dL — AB (ref 65–99)
Glucose-Capillary: 188 mg/dL — ABNORMAL HIGH (ref 65–99)

## 2016-12-21 MED ORDER — INFLUENZA VAC SPLIT QUAD 0.5 ML IM SUSY
0.5000 mL | PREFILLED_SYRINGE | INTRAMUSCULAR | Status: DC
  Filled 2016-12-21: qty 0.5

## 2016-12-21 MED ORDER — NAPROXEN 500 MG PO TABS
500.0000 mg | ORAL_TABLET | Freq: Two times a day (BID) | ORAL | Status: DC | PRN
  Administered 2016-12-21 – 2016-12-24 (×5): 500 mg via ORAL
  Filled 2016-12-21 (×6): qty 1

## 2016-12-21 MED ORDER — CLONIDINE HCL 0.1 MG PO TABS
0.1000 mg | ORAL_TABLET | Freq: Every day | ORAL | Status: DC
  Filled 2016-12-21 (×2): qty 1

## 2016-12-21 MED ORDER — TRAZODONE HCL 50 MG PO TABS
50.0000 mg | ORAL_TABLET | Freq: Every evening | ORAL | Status: DC | PRN
  Administered 2016-12-23: 50 mg via ORAL
  Filled 2016-12-21 (×4): qty 1

## 2016-12-21 MED ORDER — DICYCLOMINE HCL 20 MG PO TABS
20.0000 mg | ORAL_TABLET | Freq: Four times a day (QID) | ORAL | Status: DC | PRN
  Administered 2016-12-21 – 2016-12-22 (×7): 20 mg via ORAL
  Filled 2016-12-21 (×7): qty 1

## 2016-12-21 MED ORDER — INSULIN ASPART 100 UNIT/ML ~~LOC~~ SOLN
0.0000 [IU] | Freq: Three times a day (TID) | SUBCUTANEOUS | Status: DC
  Administered 2016-12-21: 8 [IU] via SUBCUTANEOUS
  Administered 2016-12-21: 3 [IU] via SUBCUTANEOUS

## 2016-12-21 MED ORDER — CLONIDINE HCL 0.1 MG PO TABS
0.1000 mg | ORAL_TABLET | Freq: Four times a day (QID) | ORAL | Status: AC
  Administered 2016-12-21 – 2016-12-23 (×9): 0.1 mg via ORAL
  Filled 2016-12-21 (×10): qty 1

## 2016-12-21 MED ORDER — LOPERAMIDE HCL 2 MG PO CAPS
2.0000 mg | ORAL_CAPSULE | ORAL | Status: DC | PRN
  Administered 2016-12-21: 4 mg via ORAL
  Filled 2016-12-21: qty 2

## 2016-12-21 MED ORDER — MAGNESIUM HYDROXIDE 400 MG/5ML PO SUSP: 30.0000 mL | Freq: Every day | ORAL | Status: DC | PRN

## 2016-12-21 MED ORDER — ACETAMINOPHEN 325 MG PO TABS
650.0000 mg | ORAL_TABLET | Freq: Four times a day (QID) | ORAL | Status: DC | PRN
  Filled 2016-12-21: qty 2

## 2016-12-21 MED ORDER — METHOCARBAMOL 500 MG PO TABS
500.0000 mg | ORAL_TABLET | Freq: Three times a day (TID) | ORAL | Status: DC | PRN
  Administered 2016-12-21 – 2016-12-24 (×5): 500 mg via ORAL
  Filled 2016-12-21 (×7): qty 1

## 2016-12-21 MED ORDER — ALUM & MAG HYDROXIDE-SIMETH 200-200-20 MG/5ML PO SUSP: 30.0000 mL | ORAL | Status: DC | PRN

## 2016-12-21 MED ORDER — TERBINAFINE HCL 1 % EX CREA
TOPICAL_CREAM | Freq: Two times a day (BID) | CUTANEOUS | Status: DC
  Administered 2016-12-21 – 2016-12-25 (×7): via TOPICAL
  Filled 2016-12-21: qty 12

## 2016-12-21 MED ORDER — HYDROXYZINE HCL 25 MG PO TABS: 25.0000 mg | ORAL_TABLET | Freq: Four times a day (QID) | ORAL | Status: DC | PRN

## 2016-12-21 MED ORDER — CLONIDINE HCL 0.1 MG PO TABS
0.1000 mg | ORAL_TABLET | ORAL | Status: AC
  Administered 2016-12-23 – 2016-12-25 (×4): 0.1 mg via ORAL
  Filled 2016-12-21 (×4): qty 1

## 2016-12-21 MED ORDER — TERBINAFINE HCL 1 % EX CREA
TOPICAL_CREAM | Freq: Two times a day (BID) | CUTANEOUS | Status: DC
  Filled 2016-12-21: qty 12

## 2016-12-21 MED ORDER — NICOTINE 21 MG/24HR TD PT24
21.0000 mg | MEDICATED_PATCH | Freq: Every day | TRANSDERMAL | Status: DC
  Administered 2016-12-21 – 2016-12-25 (×5): 21 mg via TRANSDERMAL
  Filled 2016-12-21 (×7): qty 1

## 2016-12-21 MED ORDER — HYDROXYZINE HCL 25 MG PO TABS
25.0000 mg | ORAL_TABLET | Freq: Four times a day (QID) | ORAL | Status: DC | PRN
  Administered 2016-12-21 – 2016-12-24 (×8): 25 mg via ORAL
  Filled 2016-12-21 (×10): qty 1

## 2016-12-21 MED ORDER — ONDANSETRON 4 MG PO TBDP
4.0000 mg | ORAL_TABLET | Freq: Four times a day (QID) | ORAL | Status: DC | PRN
  Administered 2016-12-21 – 2016-12-22 (×5): 4 mg via ORAL
  Filled 2016-12-21 (×5): qty 1

## 2016-12-21 NOTE — Progress Notes (Addendum)
Patient ID: Chase Reed, male   DOB: 1966-03-24, 51 y.o.   MRN: [id number]   Erythematic rash noted on patients buttocks and R groin area. Pt states this is a common recurrence. States "I normally use what you treat jock itch with." Pt reports itching. Provider notified. See orders. Attempted to contact infection prevention, diagnosis needed. Will call back for further instructions.

## 2016-12-21 NOTE — H&P (Signed)
Psychiatric Admission Assessment Adult  Patient Identification: Chase Reed MRN:  [id number] Date of Evaluation:  12/21/2016 Chief Complaint:  Bipolar Substance Abuse Disorder Principal Diagnosis: Opiate withdrawals                                         Substance Induced Mood Disorder Diagnosis:   Patient Active Problem List   Diagnosis Date Noted  . Bipolar 1 disorder, depressed, severe (HCC) [F31.4] 12/21/2016  . Tinea cruris [B35.6] 12/21/2016  . 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 Elevated lipase [R74.8] 05/02/2016   History of Present Illness:  51 yo Caucasian male, separated, homeless and unemployed. Background history of Substance Use Disorder,  Mood Disorder and medical comorbidity as listed below. Self presented to the unit yesterday seeking help. Reports recent relapse of drug use. Reports worsening depression, poor sleep and lack of interest in usual activities. Reports losing about 40 pounds. Not expressing suicidal or homicidal thoughts. Significant labs at presentation includes UDS positive for cocaine and opiates. CBC shows pancytopenia.  Nursing staff reports that his blood pressure has been elevated. Likely due to cocaine use. At interview, patient is curled up in bed. Says he is not feeling good. Just wants help with withdrawals. Patient denies pounding headache. No hallucination in any modality. Says he is sick in his stomach and aching all over. No thoughts of suicide. No homicidal thoughts. Not expressing any stressors. No persecutory delusion. Not expressing any other delusion. Says he wants to get better and maybe get into rehab.  Very irritable as he feels he is being asked the same thing over and over.    Associated Signs/Symptoms: Depression Symptoms:  as above (Hypo) Manic Symptoms:  as above Anxiety Symptoms:   As above Psychotic Symptoms:  As above PTSD Symptoms: NA Total Time spent with patient: 45 minutes  Past Psychiatric History: Patient denies any past history of Bipolar Disorder as documented. Says he has never been admitted in the past. Says he has never been on psychotropic medications. Reports history of substance use disorder. Says did well on Methadone. He is no longer on Methadone. He denies any past suicidal behavior. Denies any past accidental Od. Says he was in rehab once many years ago  Is the patient at risk to self? No.  Has the patient been a risk to self in the past 6 months? No.  Has the patient been a risk to self within the distant past? No.  Is the patient a risk to others? No.  Has the patient been a risk to others in the past 6 months? No.  Has the patient been a risk to others within the distant past? No.   Prior Inpatient Therapy: Prior Inpatient Therapy: Yes Prior Therapy Dates:  (years ago) Prior Therapy Facilty/Provider(s): [facility] Reason for Treatment: SA Prior Outpatient Therapy: Prior Outpatient Therapy: Yes Prior Therapy Dates: ongoing Prior Therapy Facilty/Provider(s): [facility] [place], [place] Reason for Treatment: SA, Bipolar Does patient have an ACCT team?: No Does patient have Intensive In-House Services?  : No Does patient have [facility] services? : Yes Does patient have P4CC services?: No  Alcohol Screening: Patient refused Alcohol Screening Tool: Yes (I don't drink) Substance Abuse History in the last 12 months:  Yes.   Consequences of Substance Abuse: Medical Consequences:  Reported  history of liver disease. Hypertension likely related to cocaine use.  Family Consequences:  Has lost his job and housing. His family is living away from him and he is homeless.  Withdrawal Symptoms:   Cramps aches, nausea, inward restlessness.  Previous Psychotropic Medications: No  Psychological Evaluations: Yes  Past Medical History:  Past Medical  History:  Diagnosis Date  . Cirrhosis (HCC)   . Diabetes mellitus without complication (HCC)   . Hepatitis     Past Surgical History:  Procedure Laterality Date  . LITHOTRIPSY     Family History: History reviewed. No pertinent family history. Family Psychiatric  History: Addiction on both sides of his family.  Tobacco Screening: Have you used any form of tobacco in the last 30 days? (Cigarettes, Smokeless Tobacco, Cigars, and/or Pipes): Yes Tobacco use, Select all that apply: 5 or more cigarettes per day Are you interested in Tobacco Cessation Medications?: Yes, will notify MD for an order Counseled patient on smoking cessation including recognizing danger situations, developing coping skills and basic information about quitting provided: Refused/Declined practical counseling Social History:  History  Alcohol Use  . Yes    Comment: rarely      History  Drug Use  . Types: Heroin    Additional Social History: Marital status: Married    Pain Medications: denies Prescriptions: Novolog, Neurontin Over the Counter: denies History of alcohol / drug use?: Yes Longest period of sobriety (when/how long): 13 years Negative Consequences of Use: Financial, Personal relationships, Work / Programmer, multimedia Withdrawal Symptoms: Agitation, Diarrhea, Nausea / Vomiting, Tremors Name of Substance 1: heroin 1 - Age of First Use: 20s 1 - Amount (size/oz): 1/2-1 gram daily 1 - Frequency: daily 1 - Duration: since july 2017 1 - Last Use / Amount: 12/20/2016       Allergies:   Allergies  Allergen Reactions  . Suboxone [Buprenorphine Hcl-Naloxone Hcl] Nausea And Vomiting    withdrawals   Lab Results:  Results for orders placed or performed during the hospital encounter of 12/21/16 (from the past 48 hour(s))  Glucose, capillary     Status: Abnormal   Collection Time: 12/21/16  1:04 AM  Result Value Ref Range   Glucose-Capillary 165 (H) 65 - 99 mg/dL  Glucose, capillary     Status: Abnormal    Collection Time: 12/21/16  6:31 AM  Result Value Ref Range   Glucose-Capillary 205 (H) 65 - 99 mg/dL    Blood Alcohol level:  Lab Results  Component Value Date   ETH <5 12/20/2016   ETH <5 05/01/2016    Metabolic Disorder Labs:  No results found for: HGBA1C, MPG No results found for: PROLACTIN No results found for: CHOL, TRIG, HDL, CHOLHDL, VLDL, LDLCALC  Current Medications: Current Facility-Administered Medications  Medication Dose Route Frequency Provider Last Rate Last Dose  . alum & mag hydroxide-simeth (MAALOX/MYLANTA) 200-200-20 MG/5ML suspension 30 mL  30 mL Oral Q4H PRN Laveda Abbe, NP      . cloNIDine (CATAPRES) tablet 0.1 mg  0.1 mg Oral QID Laveda Abbe, NP   0.1 mg at 12/21/16 0834   Followed by  . [START ON 12/23/2016] cloNIDine (CATAPRES) tablet 0.1 mg  0.1 mg Oral BH-qamhs Laveda Abbe, NP       Followed by  . [START ON 12/26/2016] cloNIDine (CATAPRES) tablet 0.1 mg  0.1 mg Oral QAC breakfast Laveda Abbe, NP      . dicyclomine (BENTYL) tablet 20 mg  20 mg Oral Q6H PRN Dorise Hiss  Arville Care, NP   20 mg at 12/21/16 0834  . hydrOXYzine (ATARAX/VISTARIL) tablet 25 mg  25 mg Oral Q6H PRN Laveda Abbe, NP   25 mg at 12/21/16 0834  . [START ON 12/22/2016] Influenza vac split quadrivalent PF (FLUARIX) injection 0.5 mL  0.5 mL Intramuscular Tomorrow-1000 Jackelyn Poling, NP      . insulin aspart (novoLOG) injection 0-15 Units  0-15 Units Subcutaneous TID WC Laveda Abbe, NP   Stopped at 12/21/16 [phone]  . loperamide (IMODIUM) capsule 2-4 mg  2-4 mg Oral PRN Laveda Abbe, NP   4 mg at 12/21/16 0216  . magnesium hydroxide (MILK OF MAGNESIA) suspension 30 mL  30 mL Oral Daily PRN Laveda Abbe, NP      . methocarbamol (ROBAXIN) tablet 500 mg  500 mg Oral Q8H PRN Laveda Abbe, NP      . naproxen (NAPROSYN) tablet 500 mg  500 mg Oral BID PRN Laveda Abbe, NP      . nicotine (NICODERM CQ - dosed in mg/24  hours) patch 21 mg  21 mg Transdermal Daily Jackelyn Poling, NP   21 mg at 12/21/16 2956  . ondansetron (ZOFRAN-ODT) disintegrating tablet 4 mg  4 mg Oral Q6H PRN Laveda Abbe, NP   4 mg at 12/21/16 0834  . terbinafine (LAMISIL) 1 % cream   Topical BID Jackelyn Poling, NP      . traZODone (DESYREL) tablet 50 mg  50 mg Oral QHS PRN Laveda Abbe, NP       PTA Medications: Prescriptions Prior to Admission  Medication Sig Dispense Refill Last Dose  . atorvastatin (LIPITOR) 10 MG tablet Take 1 tablet (10 mg total) by mouth daily.   Past Week at Unknown time  . buprenorphine-naloxone (SUBOXONE) 8-2 MG SUBL SL tablet Place 1 tablet under the tongue daily.   05/02/2016 at Unknown time  . buPROPion (WELLBUTRIN) 75 MG tablet Take 1 tablet (75 mg total) by mouth 2 (two) times daily.   Past Week at Unknown time  . dicyclomine (BENTYL) 20 MG tablet Take 1 tablet (20 mg total) by mouth 2 (two) times daily. 20 tablet 0   . doxepin (SINEQUAN) 10 MG capsule Take 1 capsule (10 mg total) by mouth at bedtime.   Past Week at Unknown time  . gabapentin (NEURONTIN) 300 MG capsule Take 1 capsule (300 mg total) by mouth at bedtime.   Past Week at Unknown time  . insulin aspart (NOVOLOG) 100 UNIT/ML injection Inject 2-8 Units into the skin See admin instructions. Sliding scale.over 250--2 units over 300--4 units over 350--6 units over 400--8 units Up to 50 units daily. 1 mL 0 over a month ago  . insulin glargine (LANTUS) 100 UNIT/ML injection Inject 0.15 mLs (15 Units total) into the skin daily. (Maximum of 45 units daily.) 1 mL 0 over a month ago  . metFORMIN (GLUCOPHAGE) 1000 MG tablet Take 1 tablet (1,000 mg total) by mouth 2 (two) times daily with a meal.   Past Week at Unknown time  . methadone (DOLOPHINE) 10 MG/ML solution Take 13.5 mLs (135 mg total) by mouth daily. Per methadone clinic   Past Week at Unknown time  . promethazine (PHENERGAN) 25 MG tablet Take 1 tablet (25 mg total) by mouth every 6  (six) hours as needed for nausea or vomiting. 12 tablet 0   . sitaGLIPtin (JANUVIA) 100 MG tablet Take 1 tablet (100 mg total) by mouth daily.  Past Week at Unknown time    Musculoskeletal: Strength & Muscle Tone: Unable to assess at this time.  Gait & Station: Unable to assess at this time.  Patient leans: Unable to assess at this time.    Psychiatric Specialty Exam: Physical Exam Unable to assess at this time.   ROS as above  Blood pressure (!) 139/91, pulse 93, temperature 97.4 F (36.3 C), temperature source Oral, resp. rate 17, height 5\' 11"  (1.803 m), weight 88.9 kg (196 lb), SpO2 99 %.Body mass index is 27.34 kg/m.  General Appearance: In bed, in distress, poor grooming, irritable and dysphoric. Interview shortened as patient could not tolerate probes at this time. He was on edge and could have easily become explosive.   Eye Contact:  Absent  Speech:  Clear and Coherent. Not pressured. Normal prosody  Volume:  Normal  Mood:  Dysphoric  Affect:  Blunt and Congruent  Thought Process:  Coherent  Orientation:  Full (Time, Place, and Person)  Thought Content:  Focused on getting better. No delusional preoccupation. No thoughts of violence. No hallucination in any modality.   Suicidal Thoughts:  No  Homicidal Thoughts:  No  Memory:  Unable to assess at this time.   Judgement:  Impaired  Insight:  Good  Psychomotor Activity:  Restlessness  Concentration:  limited  Recall:  Unable to assess at this time.   Fund of Knowledge:  Unable to assess at this time.   Language:  Good  Akathisia:  NA  Handed:    AIMS (if indicated):     Assets:  Desire for Improvement  ADL's:  Impaired  Cognition:  Impaired,  Moderate  Sleep:  Number of Hours: 3.25    Treatment Plan Summary: Patient is coming off opiates and cocaine. He is very dysphoric and irritable. Psychosocial  stressors could not be explored at this time as he was about to get explosive. We agreed on symptomatic detox for now.  We would explore for any underlying major mental illness after detox.   Psychiatric: SUD ( opiates and cocaine). Currently intoxicated with cocaine and withdrawing from opiates.  Substance Induced mood disorder  Medical: DM :poor control. Off oral hypoglycemics. States that he sporadically uses sliding scale at home. Would continue with sliding scale and explore use of long              acting insulin tomorrow.  Cirrhosis : Normal LFT Diverticulitis Atherosclerosis Peripheral neuropathy.   Psychosocial:  Homelessness Unemployed Chaotic family dynamics   PLAN: 1. Symptomatic opiate withdrawal protocol 2. Would monitor hypertension and manage with Clonidine.  3. Not suitable for groups and unit activities at this time 4. Monitor mood, behavior and interaction with peers 5. Continue home medical medications.  6. SW would explore locally available resources with patient (when  he gets better)    Observation Level/Precautions:  Detox  Laboratory:    Psychotherapy:    Medications:    Consultations:    Discharge Concerns:    Estimated LOS:  Other:     Physician Treatment Plan for Primary Diagnosis: Bipolar 1 disorder, depressed, severe (HCC) Long Term Goal(s): Improvement in symptoms so as ready for discharge  Short Term Goals: Ability to identify changes in lifestyle to reduce recurrence of condition will improve, Ability to verbalize feelings will improve, Ability to disclose and discuss suicidal ideas, Ability to demonstrate self-control will improve, Ability to identify and develop effective coping behaviors will improve, Ability to maintain clinical measurements within normal limits will improve,  Compliance with prescribed medications will improve and Ability to identify triggers associated with substance abuse/mental health issues will improve  Physician Treatment Plan for Secondary Diagnosis: Principal Problem:   Bipolar 1 disorder, depressed, severe (HCC) Active  Problems:   Tinea cruris  Long Term Goal(s): Improvement in symptoms so as ready for discharge  Short Term Goals: Ability to identify changes in lifestyle to reduce recurrence of condition will improve, Ability to verbalize feelings will improve, Ability to disclose and discuss suicidal ideas, Ability to demonstrate self-control will improve, Ability to identify and develop effective coping behaviors will improve, Ability to maintain clinical measurements within normal limits will improve, Compliance with prescribed medications will improve and Ability to identify triggers associated with substance abuse/mental health issues will improve  I certify that inpatient services furnished can reasonably be expected to improve the patient's condition.    Georgiann CockerVincent A Norell Brisbin, MD 2/18/20189:54 AM

## 2016-12-21 NOTE — Progress Notes (Signed)
Patient ID: Chase Reed, male   DOB: [birth year], 51 y.o.   MRN: [id number]  Infection prevention RN contacted post diagnosis of tinea cruris. Instructions that after first treatment of cream, pt is able to have a roommate. Pt treated with Lamisil cream, see MAR. Tolerated well. Provided with and educated on using paper toilet seat covers while toileting, verbal understanding expressed. Will continue to monitor.

## 2016-12-21 NOTE — Progress Notes (Signed)
Patient reports reoccurring rash in groin area and buttocks that he treats with medication for "jock itch." Erythematous ringed lesions noted in groin area and on buttocks. Will treat for Tinea Cruris with terbinafine cream BID.

## 2016-12-21 NOTE — Tx Team (Addendum)
Initial Treatment Plan 12/21/2016 3:04 AM Chase Lisravis E Cattell XBJ:[id number]    PATIENT STRESSORS: Financial difficulties Loss of housing, family Occupational concerns Substance abuse   PATIENT STRENGTHS: Ability for insight Capable of independent living Communication skills Motivation for treatment/growth Supportive family/friends   PATIENT IDENTIFIED PROBLEMS: Substance abuse- "using heroin every day"  depression  Homelessness  "want to get into a long term treatment place like [facility]"  Ineffective individual coping             DISCHARGE CRITERIA:  Ability to meet basic life and health needs Improved stabilization in mood, thinking, and/or behavior Verbal commitment to aftercare and medication compliance Withdrawal symptoms are absent or subacute and managed without 24-hour nursing intervention  PRELIMINARY DISCHARGE PLAN: Attend aftercare/continuing care group Attend PHP/IOP Placement in alternative living arrangements  PATIENT/FAMILY INVOLVEMENT: This treatment plan has been presented to and reviewed with the patient, Chase Reed . The patient and family have been given the opportunity to ask questions and make suggestions.  Aurora Maskwyman, Branden Vine E, RN 12/21/2016, 3:04 AM

## 2016-12-21 NOTE — Assessment & Plan Note (Signed)
Erythematous ringed lesions noted on left groin and buttocks. Will treat with terbinafine cream BID.

## 2016-12-21 NOTE — Progress Notes (Addendum)
Patient ID: Chase Reed, male   DOB: [birth year], 51 y.o.   MRN: [id number]  51 [facility] requesting detox from heroin. Pt affect is blunted, speech is logical, behavior restless/fidgety. Past medical history of Hep C, cirrhosis, kidney/urethral stones, diabetes type 2 and bipolar disorder. 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 Reports that he reads lips to compensate for hearing loss. Pt refused any other disability aids. Pt has an erythematic rash on buttocks and right groin area. Endorses itching. Reports he has been taking Novolog and Neurontin as needed for the past couple of months.   Pt has a long ongoing history of heroin abuse, longest period of sobriety was for 13 years. Pt relapsed in June of 2017. Pt is now homeless, his car has broken down and has been missing the support of his wife and daughter. Pt requests "to go to a long term facility", suggest [facility] as it "worked well for me before." Pt reports increased anxiety about withdrawing. Endorses current withdrawal symptoms including restlessness/tremors, soft stools, generalized cold chills and agitation. Endorses symptoms of depression including anhedonia, worthlessness, fatigue, anxiety and crying spells. Pt currently denies SI/HI and A/V hallucinations. Pt verbally agrees to seek staff if SI/HI or A/VH occurs and to consult with staff before acting on any harmful thoughts.   Consents signed, skin/belongings search completed and pt oriented to unit. Pt stable at this time. Pt given the opportunity to express concerns and ask questions. Pt given toiletries, meal and medications. Will continue to monitor.

## 2016-12-21 NOTE — [facility] Group Notes (Signed)
[facility] LCSW Group Therapy  12/21/2016  At 10:15 AM  Type of Therapy:  Group Therapy ~ Preparing for Success  Participation Level:  Did Not Attend -invited to participate yet did not despite overhead announcement and encouragement by staff    Chase Reed Chase Aurther Harlin, LCSW    

## 2016-12-21 NOTE — [facility] Suicide Risk Assessment (Signed)
[facility]   Nursing information obtained from:    Demographic factors:    Current Mental Status:    Loss Factors:    Historical Factors:    Risk Reduction Factors:     Total Time spent with patient: 15 minutes Principal Problem: Opiate withdrawal (HCC) Diagnosis:   Patient Active Problem List   Diagnosis Date Noted  . Opiate withdrawal (HCC) [F11.23] 12/21/2016  . Tinea cruris [B35.6] 12/21/2016  . Substance induced mood disorder (HCC) [F19.94] 12/21/2016  . 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 Elevated lipase [R74.8] 05/02/2016   Subjective Data:  51 yo Caucasian male, separated, homeless and unemployed. Background history of Substance Use Disorder,  Mood Disorder and medical comorbidity as listed below. Self presented to the unit yesterday seeking help. Reports recent relapse of drug use. Reports worsening depression, poor sleep and lack of interest in usual activities. Reports losing about 40 pounds. Not expressing suicidal or homicidal thoughts. Significant labs at presentation includes UDS positive for cocaine and opiates. CBC shows pancytopenia.  Nursing staff reports that his blood pressure has been elevated. Likely due to cocaine use. At interview, patient is curled up in bed. Says he is not feeling good. Just wants help with withdrawals. Patient denies pounding headache. No hallucination in any modality. Says he is sick in his stomach and aching all over. No thoughts of suicide. No homicidal thoughts. Not expressing any stressors. No persecutory delusion. Not expressing any other delusion. Says he wants to get better and maybe get into rehab.  Very irritable as he feels he is being asked the same thing over and over.   Continued Clinical Symptoms:    The "Alcohol Use Disorders Identification Test",  Guidelines for Use in Primary Care, Second Edition.  World Science writerHealth Organization [facility](WHO). Score between 0-7:  no or low risk or alcohol related problems. Score between 8-15:  moderate risk of alcohol related problems. Score between 16-19:  high risk of alcohol related problems. Score 20 or above:  warrants further diagnostic evaluation for alcohol dependence and treatment.   CLINICAL FACTORS:  Substance use disorder Opiate withdrawals Cocaine intoxication Depression Agitation Insomnia.    Musculoskeletal: Strength & Muscle Tone: As in H&P Gait & Station: As in H&P Patient leans: As in H&P  Psychiatric Specialty Exam: Physical Exam As in H&P  ROS  Blood pressure (!) 139/91, pulse 93, temperature 97.4 F (36.3 C), temperature source Oral, resp. rate 17, height 5\' 11"  (1.803 m), weight 88.9 kg (196 lb), SpO2 99 %.Body mass index is 27.34 kg/m.  General Appearance: As in H&P  Eye Contact:  As in H&P  Speech:  As in H&P  Volume:  As in H&P  Mood:  As in H&P  Affect: As in H&P  Thought Process:  As in H&P  Orientation:  As in H&P  Thought Content:  As in H&P  Suicidal Thoughts:  As in H&P  Homicidal Thoughts:  As in H&P  Memory:  As in H&P  Judgement:  As in H&P  Insight:  As in H&P  Psychomotor Activity:  As in H&P  Concentration:  As in H&P  Recall:  As in H&P  Fund of Knowledge:  As in H&P  Language:  As in H&P  Akathisia:  As in H&P   Handed:    AIMS (if indicated):     Assets:  As in H&P  ADL's:  Impaired  Cognition:  Impaired,  Moderate  Sleep:  Number of Hours: 3.25      COGNITIVE FEATURES THAT CONTRIBUTE TO RISK:  Loss of executive function    SUICIDE RISK:   Moderate:  Frequent suicidal ideation with limited intensity, and duration, some specificity in terms of plans, no associated intent, good self-control, limited dysphoria/symptomatology, some risk factors present, and identifiable protective factors, including available and accessible social  support.  PLAN OF CARE:  As in H&P   I certify that inpatient services furnished can reasonably be expected to improve the patient's condition.   Georgiann Cocker, MD 12/21/2016, 10:49 AM

## 2016-12-21 NOTE — Progress Notes (Signed)
D: Feliz Beamravis reports multiple S&S of withdrawal, including chills, body aches, nausea and anxiety. Most recent COWS 14. He did not eat breakfast but ate some lunch. He's been hypertensive, which clonidine has been helping (see flowsheets). He's remained in his room due to feeling generally unwell.  A: Meds given as ordered, including multiple PRNs to manage withdrawal symptoms (see MAR). Q15 safety checks maintained. Support/encouragement offered. Reviewed fall precautions. Urged him to alert staff with any concerns/needs.  R: Pt remains free from harm and continues with treatment. Will continue to monitor for needs/safety.

## 2016-12-22 DIAGNOSIS — F1994 Other psychoactive substance use, unspecified with psychoactive substance-induced mood disorder: Secondary | ICD-10-CM

## 2016-12-22 DIAGNOSIS — Z79899 Other long term (current) drug therapy: Secondary | ICD-10-CM

## 2016-12-22 DIAGNOSIS — Z794 Long term (current) use of insulin: Secondary | ICD-10-CM

## 2016-12-22 DIAGNOSIS — Z811 Family history of alcohol abuse and dependence: Secondary | ICD-10-CM

## 2016-12-22 DIAGNOSIS — F1123 Opioid dependence with withdrawal: Principal | ICD-10-CM

## 2016-12-22 DIAGNOSIS — Z888 Allergy status to other drugs, medicaments and biological substances status: Secondary | ICD-10-CM

## 2016-12-22 DIAGNOSIS — B356 Tinea cruris: Secondary | ICD-10-CM

## 2016-12-22 LAB — GLUCOSE, CAPILLARY
GLUCOSE-CAPILLARY: 262 mg/dL — AB (ref 65–99)
GLUCOSE-CAPILLARY: 256 mg/dL — AB (ref 65–99)
GLUCOSE-CAPILLARY: 228 mg/dL — AB (ref 65–99)
Glucose-Capillary: 189 mg/dL — ABNORMAL HIGH (ref 65–99)

## 2016-12-22 MED ORDER — LINAGLIPTIN 5 MG PO TABS
5.0000 mg | ORAL_TABLET | Freq: Every day | ORAL | Status: DC
Start: 2016-12-22 — End: 2016-12-25
  Administered 2016-12-22 – 2016-12-25 (×4): 5 mg via ORAL
  Filled 2016-12-22 (×6): qty 1

## 2016-12-22 MED ORDER — INSULIN ASPART 100 UNIT/ML ~~LOC~~ SOLN: 2.0000 [IU] | SUBCUTANEOUS | Status: DC

## 2016-12-22 MED ORDER — ATORVASTATIN CALCIUM 10 MG PO TABS
10.0000 mg | ORAL_TABLET | Freq: Every day | ORAL | Status: DC
  Administered 2016-12-22 – 2016-12-24 (×3): 10 mg via ORAL
  Filled 2016-12-22 (×5): qty 1

## 2016-12-22 MED ORDER — INSULIN ASPART 100 UNIT/ML ~~LOC~~ SOLN: 0.0000 [IU] | Freq: Three times a day (TID) | SUBCUTANEOUS | Status: DC

## 2016-12-22 MED ORDER — LORAZEPAM 1 MG PO TABS
ORAL_TABLET | ORAL | Status: AC
  Filled 2016-12-22: qty 1

## 2016-12-22 MED ORDER — INSULIN ASPART 100 UNIT/ML ~~LOC~~ SOLN
0.0000 [IU] | Freq: Three times a day (TID) | SUBCUTANEOUS | Status: DC
  Administered 2016-12-22: 2 [IU] via SUBCUTANEOUS
  Administered 2016-12-23: 6 [IU] via SUBCUTANEOUS

## 2016-12-22 MED ORDER — LORAZEPAM 1 MG PO TABS
1.0000 mg | ORAL_TABLET | Freq: Once | ORAL | Status: AC
  Administered 2016-12-22: 1 mg via ORAL

## 2016-12-22 MED ORDER — BUPROPION HCL ER (SR) 150 MG PO TB12
150.0000 mg | ORAL_TABLET | Freq: Every day | ORAL | Status: DC
  Administered 2016-12-22 – 2016-12-25 (×4): 150 mg via ORAL
  Filled 2016-12-22 (×6): qty 1

## 2016-12-22 NOTE — Progress Notes (Signed)
Recreation Therapy Notes  Date: 12/22/16 Time: 0930 Location: 300 Hall Dayroom  Group Topic: Stress Management  Goal Area(s) Addresses:  Patient will verbalize importance of using healthy stress management.  Patient will identify positive emotions associated with healthy stress management.   Intervention: Stress Management  Activity :  Guided Imagery.  LRT introduced to thebstress management technique guided imagery.  LRT read a script that allowed patients to take a "mental vacation" from their surroundings.  Patients were to follow along as LRT read script to engage in the technique.   Education:  Stress Management, Discharge Planning.   Education Outcome: Acknowledges edcuation/In group clarification offered/Needs additional education  Clinical Observations/Feedback: Pt did not attend group.    Dakia Schifano, LRT/CTRS         Cillian Gwinner A 12/22/2016 12:13 PM 

## 2016-12-22 NOTE — Tx Team (Signed)
Interdisciplinary Treatment and Diagnostic Plan Update 12/22/2016 Time of Session: 9:30am  Chase Reed  MRN: [id number]  Principal Diagnosis: Opiate withdrawal [facility])  Secondary Diagnoses: Principal Problem:   Opiate withdrawal ([place]) Active Problems:   Tinea cruris   Substance induced mood disorder ([place])   Current Medications:  Current Facility-Administered Medications  Medication Dose Route Frequency Provider Last Rate Last Dose  . alum & mag hydroxide-simeth (MAALOX/MYLANTA) 200-200-20 MG/5ML suspension 30 mL  30 mL Oral Q4H PRN Ethelene Hal, NP      . cloNIDine (CATAPRES) tablet 0.1 mg  0.1 mg Oral QID Ethelene Hal, NP   0.1 mg at 12/22/16 0802   Followed by  . [START ON 12/23/2016] cloNIDine (CATAPRES) tablet 0.1 mg  0.1 mg Oral BH-qamhs Ethelene Hal, NP       Followed by  . [START ON 12/26/2016] cloNIDine (CATAPRES) tablet 0.1 mg  0.1 mg Oral QAC breakfast Ethelene Hal, NP      . dicyclomine (BENTYL) tablet 20 mg  20 mg Oral Q6H PRN Ethelene Hal, NP   20 mg at 12/22/16 1011  . hydrOXYzine (ATARAX/VISTARIL) tablet 25 mg  25 mg Oral Q6H PRN Ethelene Hal, NP   25 mg at 12/22/16 1011  . Influenza vac split quadrivalent PF (FLUARIX) injection 0.5 mL  0.5 mL Intramuscular Tomorrow-1000 Rozetta Nunnery, NP      . insulin aspart (novoLOG) injection 0-15 Units  0-15 Units Subcutaneous TID WC Ethelene Hal, NP   Stopped at 12/22/16 0700  . loperamide (IMODIUM) capsule 2-4 mg  2-4 mg Oral PRN Ethelene Hal, NP   4 mg at 12/21/16 0216  . magnesium hydroxide (MILK OF MAGNESIA) suspension 30 mL  30 mL Oral Daily PRN Ethelene Hal, NP      . methocarbamol (ROBAXIN) tablet 500 mg  500 mg Oral Q8H PRN Ethelene Hal, NP   500 mg at 12/22/16 5102  . naproxen (NAPROSYN) tablet 500 mg  500 mg Oral BID PRN Ethelene Hal, NP   500 mg at 12/22/16 0322  . nicotine (NICODERM CQ - dosed in mg/24 hours) patch 21 mg  21 mg  Transdermal Daily Rozetta Nunnery, NP   21 mg at 12/22/16 0807  . ondansetron (ZOFRAN-ODT) disintegrating tablet 4 mg  4 mg Oral Q6H PRN Ethelene Hal, NP   4 mg at 12/22/16 1011  . terbinafine (LAMISIL) 1 % cream   Topical BID Rozetta Nunnery, NP      . traZODone (DESYREL) tablet 50 mg  50 mg Oral QHS PRN Ethelene Hal, NP        PTA Medications: Prescriptions Prior to Admission  Medication Sig Dispense Refill Last Dose  . atorvastatin (LIPITOR) 10 MG tablet Take 1 tablet (10 mg total) by mouth daily.   Past Week at Unknown time  . buprenorphine-naloxone (SUBOXONE) 8-2 MG SUBL SL tablet Place 1 tablet under the tongue daily.   05/02/2016 at Unknown time  . buPROPion (WELLBUTRIN) 75 MG tablet Take 1 tablet (75 mg total) by mouth 2 (two) times daily.   Past Week at Unknown time  . dicyclomine (BENTYL) 20 MG tablet Take 1 tablet (20 mg total) by mouth 2 (two) times daily. 20 tablet 0   . doxepin (SINEQUAN) 10 MG capsule Take 1 capsule (10 mg total) by mouth at bedtime.   Past Week at Unknown time  . gabapentin (NEURONTIN) 300 MG capsule Take 1 capsule (300  mg total) by mouth at bedtime.   Past Week at Unknown time  . insulin aspart (NOVOLOG) 100 UNIT/ML injection Inject 2-8 Units into the skin See admin instructions. Sliding scale.over 250--2 units over 300--4 units over 350--6 units over 400--8 units Up to 50 units daily. 1 mL 0 over a month ago  . insulin glargine (LANTUS) 100 UNIT/ML injection Inject 0.15 mLs (15 Units total) into the skin daily. (Maximum of 45 units daily.) 1 mL 0 over a month ago  . metFORMIN (GLUCOPHAGE) 1000 MG tablet Take 1 tablet (1,000 mg total) by mouth 2 (two) times daily with a meal.   Past Week at Unknown time  . methadone (DOLOPHINE) 10 MG/ML solution Take 13.5 mLs (135 mg total) by mouth daily. Per methadone clinic   Past Week at Unknown time  . promethazine (PHENERGAN) 25 MG tablet Take 1 tablet (25 mg total) by mouth every 6 (six) hours as needed for  nausea or vomiting. 12 tablet 0   . sitaGLIPtin (JANUVIA) 100 MG tablet Take 1 tablet (100 mg total) by mouth daily.   Past Week at Unknown time    Treatment Modalities: Medication Management, Group therapy, Case management,  1 to 1 session with clinician, Psychoeducation, Recreational therapy.  Patient Stressors: Financial difficulties Loss of housing, family Occupational concerns Substance abuse Patient Strengths: Ability for insight Capable of independent living Agricultural engineer for treatment/growth Supportive family/friends  Physician Treatment Plan for Primary Diagnosis: Opiate withdrawal ([place]) Long Term Goal(s): Improvement in symptoms so as ready for discharge Short Term Goals: Ability to identify changes in lifestyle to reduce recurrence of condition will improve Ability to verbalize feelings will improve Ability to disclose and discuss suicidal ideas Ability to demonstrate self-control will improve Ability to identify and develop effective coping behaviors will improve Ability to maintain clinical measurements within normal limits will improve Compliance with prescribed medications will improve Ability to identify triggers associated with substance abuse/mental health issues will improve Ability to identify changes in lifestyle to reduce recurrence of condition will improve Ability to verbalize feelings will improve Ability to disclose and discuss suicidal ideas Ability to demonstrate self-control will improve Ability to identify and develop effective coping behaviors will improve Ability to maintain clinical measurements within normal limits will improve Compliance with prescribed medications will improve Ability to identify triggers associated with substance abuse/mental health issues will improve  Medication Management: Evaluate patient's response, side effects, and tolerance of medication regimen.  Therapeutic Interventions: 1 to 1 sessions, Unit  Group sessions and Medication administration.  Evaluation of Outcomes: Not Met  Physician Treatment Plan for Secondary Diagnosis: Principal Problem:   Opiate withdrawal ([place]) Active Problems:   Tinea cruris   Substance induced mood disorder ([place])  Long Term Goal(s): Improvement in symptoms so as ready for discharge  Short Term Goals: Ability to identify changes in lifestyle to reduce recurrence of condition will improve Ability to verbalize feelings will improve Ability to disclose and discuss suicidal ideas Ability to demonstrate self-control will improve Ability to identify and develop effective coping behaviors will improve Ability to maintain clinical measurements within normal limits will improve Compliance with prescribed medications will improve Ability to identify triggers associated with substance abuse/mental health issues will improve Ability to identify changes in lifestyle to reduce recurrence of condition will improve Ability to verbalize feelings will improve Ability to disclose and discuss suicidal ideas Ability to demonstrate self-control will improve Ability to identify and develop effective coping behaviors will improve Ability to maintain clinical measurements  within normal limits will improve Compliance with prescribed medications will improve Ability to identify triggers associated with substance abuse/mental health issues will improve  Medication Management: Evaluate patient's response, side effects, and tolerance of medication regimen.  Therapeutic Interventions: 1 to 1 sessions, Unit Group sessions and Medication administration.  Evaluation of Outcomes: Not Met  RN Treatment Plan for Primary Diagnosis: Opiate withdrawal ([place]) Long Term Goal(s): Knowledge of disease and therapeutic regimen to maintain health will improve  Short Term Goals: Ability to verbalize frustration and anger appropriately will improve, Ability to identify and develop effective  coping behaviors will improve and Compliance with prescribed medications will improve  Medication Management: RN will administer medications as ordered by provider, will assess and evaluate patient's response and provide education to patient for prescribed medication. RN will report any adverse and/or side effects to prescribing provider.  Therapeutic Interventions: 1 on 1 counseling sessions, Psychoeducation, Medication administration, Evaluate responses to treatment, Monitor vital signs and CBGs as ordered, Perform/monitor CIWA, COWS, AIMS and Fall Risk screenings as ordered, Perform wound care treatments as ordered.  Evaluation of Outcomes: Not Met  LCSW Treatment Plan for Primary Diagnosis: Opiate withdrawal ([place]) Long Term Goal(s): Safe transition to appropriate next level of care at discharge, Engage patient in therapeutic group addressing interpersonal concerns. Short Term Goals: Engage patient in aftercare planning with referrals and resources, Facilitate patient progression through stages of change regarding substance use diagnoses and concerns, Identify triggers associated with mental health/substance abuse issues and Increase skills for wellness and recovery  Therapeutic Interventions: Assess for all discharge needs, 1 to 1 time with Social worker, Explore available resources and support systems, Assess for adequacy in community support network, Educate family and significant other(s) on suicide prevention, Complete Psychosocial Assessment, Interpersonal group therapy.  Evaluation of Outcomes: Not Met  Progress in Treatment: Attending groups: Pt is new to milieu, continuing to assess  Participating in groups: Pt is new to milieu, continuing to assess  Taking medication as prescribed: Yes, MD continues to assess for medication changes as needed Toleration medication: Yes, no side effects reported at this time Family/Significant other contact made: No, pt declined at this time. Patient  understands diagnosis: Continuing to assess Discussing patient identified problems/goals with staff: Yes Medical problems stabilized or resolved: Yes Denies suicidal/homicidal ideation:  Issues/concerns per patient self-inventory: None Other: N/A  New problem(s) identified: None identified at this time.   New Short Term/Long Term Goal(s): None identified at this time.   Discharge Plan or Barriers:   Reason for Continuation of Hospitalization:  Depression Medication stabilization Withdrawal symptoms  Estimated Length of Stay: 3-5 days  Attendees: Patient: 12/22/2016 11:42 AM  Physician: Dr. Mallie Darting 12/22/2016 11:42 AM  Nursing: Linna Hoff RN; Gari Crown, RN 12/22/2016 11:42 AM  RN Care Manager: Lars Pinks, RN 12/22/2016 11:42 AM  Social Worker: Maxie Better, LCSW; Matthew Saras, [place] 12/22/2016 11:42 AM  Recreational Therapist:  12/22/2016 11:42 AM  Other: Lindell Spar, NP; Samuel Jester, NP 12/22/2016 11:42 AM  Other:  12/22/2016 11:42 AM  Other: 12/22/2016 11:42 AM  Scribe for Treatment Team: Georga Kaufmann, MSW,LCSWA 12/22/2016 11:42 AM

## 2016-12-22 NOTE — [facility] Suicide Risk Assessment (Signed)
[facility] Admission Suicide Risk [facility]   Nursing information obtained from:    Demographic factors:    Current Mental Status:    Loss Factors:    Historical Factors:    Risk Reduction Factors:     Total Time spent with patient: 45 minutes Principal Problem: Opiate withdrawal (HCC) Diagnosis:   Patient Active Problem List   Diagnosis Date Noted  . Opiate withdrawal (HCC) [F11.23] 12/21/2016  . Tinea cruris [B35.6] 12/21/2016  . Substance induced mood disorder (HCC) [F19.94] 12/21/2016  . 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 Elevated lipase [R74.8] 05/02/2016   Subjective Data: 51 YO male with opiate use disorder, severe  Continued Clinical Symptoms: restlessness, decreased mood, anxiety, insomnia, nausea   The "Alcohol Use Disorders Identification Test", Guidelines for Use in Primary Care, Second Edition.  World Science writerHealth Organization [facility](WHO). Score between 0-7:  no or low risk or alcohol related problems. Score between 8-15:  moderate risk of alcohol related problems. Score between 16-19:  high risk of alcohol related problems. Score 20 or above:  warrants further diagnostic evaluation for alcohol dependence and treatment.   CLINICAL FACTORS:   Alcohol/Substance Abuse/Dependencies   Musculoskeletal: Strength & Muscle Tone: abnormal Gait & Station: unsteady Patient leans: Right  Psychiatric Specialty Exam: Physical Exam  ROS  Blood pressure (!) 135/91, pulse 63, temperature 98 F (36.7 C), temperature source Oral, resp. rate 18, height 5\' 11"  (1.803 m), weight 88.9 kg (196 lb), SpO2 99 %.Body mass index is 27.34 kg/m.  General Appearance: Disheveled  Eye Contact:  Fair  Speech:  Clear and Coherent  Volume:  Normal  Mood:  Anxious  Affect:  Appropriate  Thought Process:  Coherent  Orientation:  Full (Time, Place, and Person)   Thought Content:  Logical  Suicidal Thoughts:  No  Homicidal Thoughts:  No  Memory:  Immediate;   Good  Judgement:  Impaired  Insight:  Fair  Psychomotor Activity:  Restlessness  Concentration:  Concentration: Fair  Recall:  Fis[employer]Fair  Fund of Knowledge:  Fair  Language:  Fair  Akathisia:  Negative  Handed:  Right  AIMS (if indicated):     Assets:  Communication Skills Desire for Improvement Financial Resources/Insurance Resilience  ADL's:  Intact  Cognition:  WNL  Sleep:  Number of Hours: 4.5      COGNITIVE FEATURES THAT CONTRIBUTE TO RISK:  None    SUICIDE RISK:   Minimal: No identifiable suicidal ideation.  Patients presenting with no risk factors but with morbid ruminations; may be classified as minimal risk based on the severity of the depressive symptoms  PLAN OF CARE: 1) detox protocol, 2) stabilize blood sugars, 3) seek out patient SA treatment after discharge  I certify that inpatient services furnished can reasonably be expected to improve the patient's condition.   Antonieta PertGreg Lawson Rawlin Reaume, MD 12/22/2016, 1:12 PM

## 2016-12-22 NOTE — H&P (Signed)
Psychiatric Admission Assessment Adult  Patient Identification: Chase Reed MRN:  [id number] Date of Evaluation:  12/22/2016 Chief Complaint:  Bipolar Substance Abuse Disorder Principal Diagnosis: Opiate withdrawal (HCC) Diagnosis:  Opiate use disorder, severe Patient Active Problem List   Diagnosis Date Noted  . Opiate withdrawal (HCC) [F11.23] 12/21/2016  . Tinea cruris [B35.6] 12/21/2016  . Substance induced mood disorder (HCC) [F19.94] 12/21/2016  . 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 Elevated lipase [R74.8] 05/02/2016   History of Present Illness: Patient is seen and examined. Patient is a 51 YO male with a long standing history of opiate dependence who was admitted for detox. The patient stated that he has a bused opiates for many years. He had been in a methadone program and remained sober x 12-13 years. He relapsed in July 2017 and has been using since that time. He stated that he has been using approximately $100 a day of IV heroin. He has wanted to get into a program, and decided that it was a good time now. He currently appears only to be having mild withdrawal symptoms. Biggest issue at this point is his poorly controlled diabetes mellitus 2. Associated Signs/Symptoms: Depression Symptoms:  depressed mood, anhedonia, insomnia, psychomotor agitation, fatigue, difficulty concentrating, hopelessness, anxiety, loss of energy/fatigue, disturbed sleep, weight loss, decreased appetite, (Hypo) Manic Symptoms:  Impulsivity, Anxiety Symptoms:  Excessive Worry, Psychotic Symptoms:  none PTSD Symptoms: Negative Total Time spent with patient: 45 minutes  Past Psychiatric History: substance abuse - opiates, cocaine, depression, anxiety  Is the patient at risk to self? No.  Has the patient been a risk to self in the past 6  months? No.  Has the patient been a risk to self within the distant past? No.  Is the patient a risk to others? No.  Has the patient been a risk to others in the past 6 months? No.  Has the patient been a risk to others within the distant past? No.   Prior Inpatient Therapy: Prior Inpatient Therapy: Yes Prior Therapy Dates:  (years ago) Prior Therapy Facilty/Provider(s): [facility] Reason for Treatment: SA Prior Outpatient Therapy: Prior Outpatient Therapy: Yes Prior Therapy Dates: ongoing Prior Therapy Facilty/Provider(s): [facility] [place] [place] Reason for Treatment: SA, Bipolar Does patient have an ACCT team?: No Does patient have Intensive In-House Services?  : No Does patient have [facility] services? : Yes Does patient have P4[facility] services?: No  Alcohol Screening: Patient refused Alcohol Screening Tool: Yes (I don't drink) Substance Abuse History in the last 12 months:  Yes.   Consequences of Substance Abuse: Family Consequences:  sent wife and daughter to live with family because of his homelessness Previous Psychotropic Medications: Yes  Psychological Evaluations: Yes  Past Medical History:  Past Medical History:  Diagnosis Date  . Cirrhosis (HCC)   . Diabetes mellitus without complication (HCC)   . Hepatitis     Past Surgical History:  Procedure Laterality Date  . LITHOTRIPSY     Family History: History reviewed. No pertinent family history. Family Psychiatric  History: alcoholism in father Tobacco Screening: Have you used any form of tobacco in the last 30 days? (Cigarettes, Smokeless Tobacco, Cigars, and/or Pipes): Yes Tobacco use, Select all that apply: 5 or more cigarettes per day Are you interested in Tobacco Cessation Medications?: Yes, will notify MD for an order Counseled patient on smoking cessation including recognizing danger situations, developing coping skills and basic  information about quitting provided: Refused/Declined practical  counseling Social History:  History  Alcohol Use  . Yes    Comment: rarely      History  Drug Use  . Types: Heroin    Additional Social History: Marital status: Married Number of Years Married: 16 What types of issues is patient dealing with in the relationship?: Pt is not staying with his wife at the moment because they lost their house and needed to split the family up to be able to be accomdated at friend's houses  Does patient have children?: Yes How many children?: 5 (4 step children and 1 of pt's own) How is patient's relationship with their children?: Fantastic with his youngest two but the other children do not like the pt and pt does not care for them either     Pain Medications: denies Prescriptions: Novolog, Neurontin Over the Counter: denies History of alcohol / drug use?: Yes Longest period of sobriety (when/how long): 13 years Negative Consequences of Use: Financial, Personal relationships, Work / Programmer, multimediachool Withdrawal Symptoms: Agitation, Diarrhea, Nausea / Vomiting, Tremors Name of Substance 1: heroin 1 - Age of First Use: 20s 1 - Amount (size/oz): 1/2-1 gram daily 1 - Frequency: daily 1 - Duration: since july 2017 1 - Last Use / Amount: 12/20/2016                  Allergies:   Allergies  Allergen Reactions  . Suboxone [Buprenorphine Hcl-Naloxone Hcl] Nausea And Vomiting    withdrawals   Lab Results:  Results for orders placed or performed during the hospital encounter of 12/21/16 (from the past 48 hour(s))  Glucose, capillary     Status: Abnormal   Collection Time: 12/21/16  1:04 AM  Result Value Ref Range   Glucose-Capillary 165 (H) 65 - 99 mg/dL  Glucose, capillary     Status: Abnormal   Collection Time: 12/21/16  6:31 AM  Result Value Ref Range   Glucose-Capillary 205 (H) 65 - 99 mg/dL  Glucose, capillary     Status: Abnormal   Collection Time: 12/21/16 11:43 AM  Result Value Ref Range   Glucose-Capillary 259 (H) 65 - 99 mg/dL  Glucose,  capillary     Status: Abnormal   Collection Time: 12/21/16  4:35 PM  Result Value Ref Range   Glucose-Capillary 195 (H) 65 - 99 mg/dL  Glucose, capillary     Status: Abnormal   Collection Time: 12/21/16  9:37 PM  Result Value Ref Range   Glucose-Capillary 188 (H) 65 - 99 mg/dL  Glucose, capillary     Status: Abnormal   Collection Time: 12/22/16  5:52 AM  Result Value Ref Range   Glucose-Capillary 189 (H) 65 - 99 mg/dL   Comment 1 Notify RN   Glucose, capillary     Status: Abnormal   Collection Time: 12/22/16 11:54 AM  Result Value Ref Range   Glucose-Capillary 262 (H) 65 - 99 mg/dL    Blood Alcohol level:  Lab Results  Component Value Date   ETH <5 12/20/2016   ETH <5 05/01/2016    Metabolic Disorder Labs:  No results found for: HGBA1C, MPG No results found for: PROLACTIN No results found for: CHOL, TRIG, HDL, CHOLHDL, VLDL, LDLCALC  Current Medications: Current Facility-Administered Medications  Medication Dose Route Frequency Provider Last Rate Last Dose  . alum & mag hydroxide-simeth (MAALOX/MYLANTA) 200-200-20 MG/5ML suspension 30 mL  30 mL Oral Q4H PRN Laveda AbbeLaurie Britton Parks, NP      . cloNIDine (  CATAPRES) tablet 0.1 mg  0.1 mg Oral QID Laveda Abbe, NP   0.1 mg at 12/22/16 1217   Followed by  . [START ON 12/23/2016] cloNIDine (CATAPRES) tablet 0.1 mg  0.1 mg Oral BH-qamhs Laveda Abbe, NP       Followed by  . [START ON 12/26/2016] cloNIDine (CATAPRES) tablet 0.1 mg  0.1 mg Oral QAC breakfast Laveda Abbe, NP      . dicyclomine (BENTYL) tablet 20 mg  20 mg Oral Q6H PRN Laveda Abbe, NP   20 mg at 12/22/16 1011  . hydrOXYzine (ATARAX/VISTARIL) tablet 25 mg  25 mg Oral Q6H PRN Laveda Abbe, NP   25 mg at 12/22/16 1011  . Influenza vac split quadrivalent PF (FLUARIX) injection 0.5 mL  0.5 mL Intramuscular Tomorrow-1000 Jackelyn Poling, NP      . insulin aspart (novoLOG) injection 0-15 Units  0-15 Units Subcutaneous TID WC Laveda Abbe, NP   Stopped at 12/22/16 0700  . loperamide (IMODIUM) capsule 2-4 mg  2-4 mg Oral PRN Laveda Abbe, NP   4 mg at 12/21/16 0216  . magnesium hydroxide (MILK OF MAGNESIA) suspension 30 mL  30 mL Oral Daily PRN Laveda Abbe, NP      . methocarbamol (ROBAXIN) tablet 500 mg  500 mg Oral Q8H PRN Laveda Abbe, NP   500 mg at 12/22/16 1216  . naproxen (NAPROSYN) tablet 500 mg  500 mg Oral BID PRN Laveda Abbe, NP   500 mg at 12/22/16 0322  . nicotine (NICODERM CQ - dosed in mg/24 hours) patch 21 mg  21 mg Transdermal Daily Jackelyn Poling, NP   21 mg at 12/22/16 0807  . ondansetron (ZOFRAN-ODT) disintegrating tablet 4 mg  4 mg Oral Q6H PRN Laveda Abbe, NP   4 mg at 12/22/16 1011  . terbinafine (LAMISIL) 1 % cream   Topical BID Jackelyn Poling, NP      . traZODone (DESYREL) tablet 50 mg  50 mg Oral QHS PRN Laveda Abbe, NP       PTA Medications: Prescriptions Prior to Admission  Medication Sig Dispense Refill Last Dose  . atorvastatin (LIPITOR) 10 MG tablet Take 1 tablet (10 mg total) by mouth daily.   Past Week at Unknown time  . buprenorphine-naloxone (SUBOXONE) 8-2 MG SUBL SL tablet Place 1 tablet under the tongue daily.   05/02/2016 at Unknown time  . buPROPion (WELLBUTRIN) 75 MG tablet Take 1 tablet (75 mg total) by mouth 2 (two) times daily.   Past Week at Unknown time  . dicyclomine (BENTYL) 20 MG tablet Take 1 tablet (20 mg total) by mouth 2 (two) times daily. 20 tablet 0   . doxepin (SINEQUAN) 10 MG capsule Take 1 capsule (10 mg total) by mouth at bedtime.   Past Week at Unknown time  . gabapentin (NEURONTIN) 300 MG capsule Take 1 capsule (300 mg total) by mouth at bedtime.   Past Week at Unknown time  . insulin aspart (NOVOLOG) 100 UNIT/ML injection Inject 2-8 Units into the skin See admin instructions. Sliding scale.over 250--2 units over 300--4 units over 350--6 units over 400--8 units Up to 50 units daily. 1 mL 0 over a month ago  .  insulin glargine (LANTUS) 100 UNIT/ML injection Inject 0.15 mLs (15 Units total) into the skin daily. (Maximum of 45 units daily.) 1 mL 0 over a month ago  . metFORMIN (GLUCOPHAGE) 1000 MG tablet Take 1  tablet (1,000 mg total) by mouth 2 (two) times daily with a meal.   Past Week at Unknown time  . methadone (DOLOPHINE) 10 MG/ML solution Take 13.5 mLs (135 mg total) by mouth daily. Per methadone clinic   Past Week at Unknown time  . promethazine (PHENERGAN) 25 MG tablet Take 1 tablet (25 mg total) by mouth every 6 (six) hours as needed for nausea or vomiting. 12 tablet 0   . sitaGLIPtin (JANUVIA) 100 MG tablet Take 1 tablet (100 mg total) by mouth daily.   Past Week at Unknown time    Musculoskeletal: Strength & Muscle Tone: decreased Gait & Station: unsteady Patient leans: Left  Psychiatric Specialty Exam: Physical Exam  ROS  Blood pressure (!) 135/91, pulse 63, temperature 98 F (36.7 C), temperature source Oral, resp. rate 18, height 5\' 11"  (1.803 m), weight 88.9 kg (196 lb), SpO2 99 %.Body mass index is 27.34 kg/m.  General Appearance: Disheveled  Eye Contact:  Fair  Speech:  Normal Rate  Volume:  Normal  Mood:  Depressed  Affect:  Blunt  Thought Process:  Coherent  Orientation:  Full (Time, Place, and Person)  Thought Content:  Logical  Suicidal Thoughts:  No  Homicidal Thoughts:  No  Memory:  Immediate;   Good  Judgement:  Impaired  Insight:  Fair  Psychomotor Activity:  Restlessness  Concentration:  Concentration: Fair  Recall:  Fair  Fund of Knowledge:  Fair  Language:  Good  Akathisia:  No  Handed:  Right  AIMS (if indicated):     Assets:  Communication Skills Desire for Improvement Financial Resources/Insurance Resilience  ADL's:  Impaired  Cognition:  WNL  Sleep:  Number of Hours: 4.5    Treatment Plan Summary: Daily contact with patient to assess and evaluate symptoms and progress in treatment, Medication management and Plan 1) detox from opiates by  protocol, 2) stablize blood sugar, 3) therapeutic mileau  Observation Level/Precautions:  Detox 15 minute checks  Laboratory:  CBC Chemistry Profile Folic Acid GGT HbAIC UDS UA  Psychotherapy:    Medications:    Consultations:    Discharge Concerns:    Estimated LOS:  Other:     Physician Treatment Plan for Primary Diagnosis: Opiate withdrawal (HCC) Long Term Goal(s): Improvement in symptoms so as ready for discharge  Short Term Goals: Ability to identify changes in lifestyle to reduce recurrence of condition will improve, Ability to verbalize feelings will improve, Ability to demonstrate self-control will improve, Ability to identify and develop effective coping behaviors will improve, Ability to maintain clinical measurements within normal limits will improve, Compliance with prescribed medications will improve and Ability to identify triggers associated with substance abuse/mental health issues will improve  Physician Treatment Plan for Secondary Diagnosis: Principal Problem:   Opiate withdrawal (HCC) Active Problems:   Tinea cruris   Substance induced mood disorder (HCC)  Long Term Goal(s): Improvement in symptoms so as ready for discharge  Short Term Goals: Ability to identify changes in lifestyle to reduce recurrence of condition will improve, Ability to verbalize feelings will improve, Ability to demonstrate self-control will improve, Ability to identify and develop effective coping behaviors will improve, Ability to maintain clinical measurements within normal limits will improve, Compliance with prescribed medications will improve and Ability to identify triggers associated with substance abuse/mental health issues will improve  I certify that inpatient services furnished can reasonably be expected to improve the patient's condition.    Antonieta Pert, MD 2/19/201812:59 PM

## 2016-12-22 NOTE — Progress Notes (Signed)
Nursing Note 12/22/2016 [id number]  Data Reports sleeping poorly.  Did not complete self-inventory.  Patient reports nausea, stomach cramping, muscle aches, runny nose, etc.   Affect depressed.  Denies HI, SI, AVH.    Action Spoke with patient 1:1, nurse offered support to patient throughout shift.  Patient given PRN's throughout day for above symptoms.  Diabetic orders changed to match home meds by MD.  Caren Hazyontinues to be monitored on 15 minute checks for safety.  Response PRN's effective.  Spent AM in room, resting, afternoon patient up in day area reporting improvement.

## 2016-12-22 NOTE — Progress Notes (Signed)
D.  Pt has remained in bed for duration of shift due to withdrawal symptoms.  Pt did not feel well enough to attend evening AA group.  Pt denies SI/HI/hallucinations at this time.  A. Support and encouragement offered, medication given as ordered for withdrawal.  Ginger ale provided as well.  R.  Pt remains safe on the unit, will continue to monitor.

## 2016-12-22 NOTE — Progress Notes (Signed)
D: Pt isolated to room majority of the evening.  A: Pt was offered support and encouragement. Pt was given scheduled medications.were done for safety.  R: safety maintained on unit.

## 2016-12-22 NOTE — Plan of Care (Signed)
Problem: Safety: Goal: Ability to remain free from injury will improve Outcome: Progressing Pt safe on the unit at this time   

## 2016-12-22 NOTE — [facility] Suicide Risk Assessment (Signed)
[facility] INPATIENT:  Family/Significant Other Suicide Prevention Education  Suicide Prevention Education:  Patient Refusal for Family/Significant Other Suicide Prevention Education: The patient Chase Reed has refused to provide written consent for family/significant other to be provided Family/Significant Other Suicide Prevention Education during admission and/or prior to discharge.  Physician notified.  Jonathon JordanLynn B Toryn Dewalt 12/22/2016, 11:46 AM

## 2016-12-22 NOTE — [facility] Counselor (Signed)
Adult Comprehensive Assessment  Patient ID: Chase Reed, male   DOB: October 30, 1966, 51 y.o.   MRN: [id number]  Information Source: Information source: Patient  Current Stressors:  Educational / Learning stressors: None reported  Employment / Job issues: Pt is currently unemployed  Family Relationships: Conflictual relationship with some family Engineer, drillingmemebrs  Financial / Lack of resources (include bankruptcy): Limited resources  Housing / Lack of housing: Pt does not have a stable place to stay. Pt stays with different friends  Physical health (include injuries & life threatening diseases): Pt has chronic pain and diabetes  Social relationships: Pt is currently not living with his spouse and is hoping to be reunited soon Substance abuse: Daily opiod use  Bereavement / Loss: None reported   Living/Environment/Situation:  Living Arrangements: Non-relatives/Friends Living conditions (as described by patient or guardian): Pt staysat different friend's houses  How long has patient lived in current situation?: About 6 mo What is atmosphere in current home: Temporary  Family History:  Marital status: Married Number of Years Married: 16 What types of issues is patient dealing with in the relationship?: Pt is not staying with his wife at the moment because they lost their house and needed to split the family up to be able to be accomdated at friend's houses  Does patient have children?: Yes How many children?: 5 (4 step children and 1 of pt's own) How is patient's relationship with their children?: Fantastic with his youngest two but the other children do not like the pt and pt does not care for them either   Childhood History:  By whom was/is the patient raised?: Mother/father and step-parent (Mother and stepfather ) Additional childhood history information: "Normal" Description of patient's relationship with caregiver when they were a child: "Good" Patient's description of current relationship  with people who raised him/her: "Good" How were you disciplined when you got in trouble as a child/adolescent?: Spanking and grouding  Does patient have siblings?: Yes Number of Siblings: 4 (1 brother and 3 sisters ) Description of patient's current relationship with siblings: Not close  Did patient suffer any verbal/emotional/physical/sexual abuse as a child?: No Did patient suffer from severe childhood neglect?: No Has patient ever been sexually abused/assaulted/raped as an adolescent or adult?: No Was the patient ever a victim of a crime or a disaster?: No Witnessed domestic violence?: No Has patient been effected by domestic violence as an adult?: No  Education:  Highest grade of school patient has completed: Graduated high school Currently a student?: No Learning disability?: No  Employment/Work Situation:   Employment situation: Unemployed (Pt has applied for disability) Patient's job has been impacted by current illness:  (NA) What is the longest time patient has a held a job?: 15 yrs  Where was the patient employed at that time?: [employer]  Has patient ever been in the Eli Lilly and Companymilitary?: No Has patient ever served in combat?: No Did You Receive Any Psychiatric Treatment/Services While in Equities traderthe Military?: No Are There Guns or Other Weapons in Your Home?: No Are These ComptrollerWeapons Safely Secured?:  (NA)  Financial Resources:   Financial resources: No income (Panhandling, borrowing etc.) Does patient have a Lawyerrepresentative payee or guardian?: No  Alcohol/Substance Abuse:   What has been your use of drugs/alcohol within the last 12 months?: Opiod use daily since June amount varies-up to a gram a day  If attempted suicide, did drugs/alcohol play a role in this?: No Alcohol/Substance Abuse Treatment Hx: Past Tx, Inpatient, Past Tx, Outpatient If yes, describe  treatment: [facility], and methadone program in [place] for 12 years (ended past Jan) Has alcohol/substance abuse ever caused legal  problems?: Yes (Drug possesion charges in 1997)  Social Support System:   Patient's Community Support System: Fair Museum/gallery exhibitions officer System: "No one knows it yet, but they're going to be a part of it." Type of faith/religion: Pt does not identify with a faith or religion  How does patient's faith help to cope with current illness?: NA  Leisure/Recreation:   Leisure and Hobbies: Play music   Strengths/Needs:   What things does the patient do well?: Playing music In what areas does patient struggle / problems for patient: Addiction "This is the biggest thing on me right now. It's horrible. Turns me into a robot"  Discharge Plan:   Does patient have access to transportation?: Yes Will patient be returning to same living situation after discharge?: Yes Currently receiving community mental health services: Yes (From Whom) (Was going to [facility] ) Does patient have financial barriers related to discharge medications?: Yes Patient description of barriers related to discharge medications: Limited resources   Summary/Recommendations:     Patient is a 51 yo male with a diagnosis of substance induced mood disorder. Pt presented to the hospital voluntarily for help with opoid use. Pt reports primary triggers for admission are family issues and financial concerns. Pt reports that he does not currently have a stable place for him and his family to stay which is a cause of stress for him. Pt now denies SI/HI/AVH. Pt has a wife and kids who he describes as supportive. Patient will benefit from crisis stabilization, medication evaluation, group therapy, and psycho education in addition to case management for discharge planning. At discharge it is recommended that patient remain compliant with established plan and continue treatment.  Joylene Draft .12/22/2016

## 2016-12-23 ENCOUNTER — Encounter (HOSPITAL_COMMUNITY): Payer: Self-pay | Admitting: Psychiatry

## 2016-12-23 LAB — CBC WITH DIFFERENTIAL/PLATELET
BASOS PCT: 0 %
Basophils Absolute: 0 10*3/uL (ref 0.0–0.1)
EOS PCT: 1 %
Eosinophils Absolute: 0.1 10*3/uL (ref 0.0–0.7)
HEMATOCRIT: 42.7 % (ref 39.0–52.0)
HEMOGLOBIN: 15.2 g/dL (ref 13.0–17.0)
LYMPHS ABS: 1.6 10*3/uL (ref 0.7–4.0)
LYMPHS PCT: 25 %
MCH: 30.3 pg (ref 26.0–34.0)
MCHC: 35.6 g/dL (ref 30.0–36.0)
MCV: 85.1 fL (ref 78.0–100.0)
MONOS PCT: 6 %
Monocytes Absolute: 0.4 10*3/uL (ref 0.1–1.0)
NEUTROS ABS: 4.2 10*3/uL (ref 1.7–7.7)
Neutrophils Relative %: 68 %
Platelets: 68 10*3/uL — ABNORMAL LOW (ref 150–400)
RBC: 5.02 MIL/uL (ref 4.22–5.81)
RDW: 13.2 % (ref 11.5–15.5)
WBC: 6.3 10*3/uL (ref 4.0–10.5)

## 2016-12-23 LAB — PROTIME-INR
INR: 0.99
PROTHROMBIN TIME: 13.1 s (ref 11.4–15.2)

## 2016-12-23 LAB — GLUCOSE, CAPILLARY
GLUCOSE-CAPILLARY: 173 mg/dL — AB (ref 65–99)
Glucose-Capillary: 368 mg/dL — ABNORMAL HIGH (ref 65–99)
Glucose-Capillary: 247 mg/dL — ABNORMAL HIGH (ref 65–99)
Glucose-Capillary: 248 mg/dL — ABNORMAL HIGH (ref 65–99)
Glucose-Capillary: 217 mg/dL — ABNORMAL HIGH (ref 65–99)

## 2016-12-23 LAB — AMYLASE: Amylase: 57 U/L (ref 28–100)

## 2016-12-23 LAB — LIPASE, BLOOD: LIPASE: 41 U/L (ref 11–51)

## 2016-12-23 MED ORDER — TOPIRAMATE 25 MG PO TABS
50.0000 mg | ORAL_TABLET | Freq: Two times a day (BID) | ORAL | Status: DC
  Administered 2016-12-23 – 2016-12-25 (×4): 50 mg via ORAL
  Filled 2016-12-23 (×10): qty 2

## 2016-12-23 MED ORDER — INSULIN ASPART 100 UNIT/ML ~~LOC~~ SOLN: 8.0000 [IU] | Freq: Once | SUBCUTANEOUS | Status: DC

## 2016-12-23 MED ORDER — RISPERIDONE 1 MG PO TBDP
1.0000 mg | ORAL_TABLET | Freq: Every day | ORAL | Status: DC
  Administered 2016-12-23 – 2016-12-24 (×2): 1 mg via ORAL
  Filled 2016-12-23 (×5): qty 1

## 2016-12-23 MED ORDER — DIPHENHYDRAMINE HCL 50 MG PO CAPS
50.0000 mg | ORAL_CAPSULE | Freq: Once | ORAL | Status: AC
  Administered 2016-12-23: 50 mg via ORAL
  Filled 2016-12-23: qty 1
  Filled 2016-12-23: qty 2

## 2016-12-23 MED ORDER — LORAZEPAM 2 MG/ML IJ SOLN
2.0000 mg | Freq: Once | INTRAMUSCULAR | Status: AC
  Administered 2016-12-23: 2 mg via INTRAMUSCULAR
  Filled 2016-12-23: qty 1

## 2016-12-23 MED ORDER — QUETIAPINE FUMARATE 100 MG PO TABS
100.0000 mg | ORAL_TABLET | Freq: Every day | ORAL | Status: DC
  Administered 2016-12-23: 100 mg via ORAL
  Filled 2016-12-23 (×2): qty 1

## 2016-12-23 MED ORDER — INSULIN ASPART 100 UNIT/ML ~~LOC~~ SOLN
0.0000 [IU] | Freq: Three times a day (TID) | SUBCUTANEOUS | Status: DC
  Administered 2016-12-24 (×2): 2 [IU] via SUBCUTANEOUS
  Administered 2016-12-24: 4 [IU] via SUBCUTANEOUS
  Administered 2016-12-25: 6 [IU] via SUBCUTANEOUS

## 2016-12-23 NOTE — Progress Notes (Signed)
D: Patient seen in his room lying in bed awake. Patient presents with irritable mood. Stated "My blood sugar is high. I can feel it. The other nurse didn't want to give me insulin". Reports symptoms of withdrawals like irritability, anxiety and restlessness. Denies pain, SI/HI, AH/VH at this time.  A: Blood sugar was 217 mg/dl. No coverage per sliding scale. Patient was later seen on day room playing card and interacting with peers. Accepted his bedtime med. Staff encouraged patient to verbalize needs to staff and to continue with the treatment plan. Routine safety checks maintained. Will continue to monitor patient.  R: Patient remains safe on unit.

## 2016-12-23 NOTE — [facility] Group Notes (Signed)
[facility] 12/23/2016 1:15pm  Type of Therapy: Mental Health Association Presentation  Participation Level: Pt invited. Did not attend.  Vito BackersLynn B. Beverely PaceBryant, MSW, [facility]WA 12/23/2016 2:49 PM

## 2016-12-23 NOTE — Progress Notes (Signed)
[facility] MD Progress Note  12/23/2016 11:29 AM TSUTOMU BARFOOT  MRN:  [id number] Subjective:  Patient is a 51 YO male with depression, anxiety and substance abuse Principal Problem: Opiate withdrawal (HCC) Diagnosis:   Patient Active Problem List   Diagnosis Date Noted  . Opiate withdrawal (HCC) [F11.23] 12/21/2016  . Tinea cruris [B35.6] 12/21/2016  . Substance induced mood disorder (HCC) [F19.94] 12/21/2016  . 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 Elevated lipase [R74.8] 05/02/2016   Total Time spent with patient: 30 minutes  Past Psychiatric History: see admission H&P  Past Medical History:  Past Medical History:  Diagnosis Date  . Cirrhosis (HCC)   . Diabetes mellitus without complication (HCC)   . Hepatitis     Past Surgical History:  Procedure Laterality Date  . LITHOTRIPSY     Family History: History reviewed. No pertinent family history. Family Psychiatric  History: see H&P Social History:  History  Alcohol Use  . Yes    Comment: rarely      History  Drug Use  . Types: Heroin    Social History   Social History  . Marital status: Married    Spouse name: N/A  . Number of children: N/A  . Years of education: N/A   Social History Main Topics  . Smoking status: Current Every Day Smoker    Packs/day: 0.50    Years: 30.00  . Smokeless tobacco: Former Neurosurgeon  . Alcohol use Yes     Comment: rarely   . Drug use: Yes    Types: Heroin  . Sexual activity: No   Other Topics Concern  . None   Social History Narrative  . None   Additional Social History:    Pain Medications: denies Prescriptions: Novolog, Neurontin Over the Counter: denies History of alcohol / drug use?: Yes Longest period of sobriety (when/how long): 13 years Negative Consequences of Use: Financial, Personal relationships, Work /  Programmer, multimedia Withdrawal Symptoms: Agitation, Diarrhea, Nausea / Vomiting, Tremors Name of Substance 1: heroin 1 - Age of First Use: 20s 1 - Amount (size/oz): 1/2-1 gram daily 1 - Frequency: daily 1 - Duration: since july 2017 1 - Last Use / Amount: 12/20/2016                  Sleep: Poor  Appetite:  Fair  Current Medications: Current Facility-Administered Medications  Medication Dose Route Frequency Provider Last Rate Last Dose  . alum & mag hydroxide-simeth (MAALOX/MYLANTA) 200-200-20 MG/5ML suspension 30 mL  30 mL Oral Q4H PRN Laveda Abbe, NP      . atorvastatin (LIPITOR) tablet 10 mg  10 mg Oral q1800 Antonieta Pert, MD   10 mg at 12/22/16 1717  . buPROPion (WELLBUTRIN SR) 12 hr tablet 150 mg  150 mg Oral Daily Antonieta Pert, MD   150 mg at 12/23/16 0813  . cloNIDine (CATAPRES) tablet 0.1 mg  0.1 mg Oral QID Laveda Abbe, NP   0.1 mg at 12/23/16 0150   Followed by  . cloNIDine (CATAPRES) tablet 0.1 mg  0.1 mg Oral BH-qamhs Laveda Abbe, NP       Followed by  . [START ON 12/26/2016] cloNIDine (CATAPRES) tablet 0.1 mg  0.1 mg Oral QAC breakfast Laveda Abbe, NP      . dicyclomine (BENTYL) tablet 20 mg  20 mg Oral Q6H PRN Laveda Abbe, NP  20 mg at 12/22/16 1722  . hydrOXYzine (ATARAX/VISTARIL) tablet 25 mg  25 mg Oral Q6H PRN Laveda AbbeLaurie Britton Parks, NP   25 mg at 12/23/16 0816  . Influenza vac split quadrivalent PF (FLUARIX) injection 0.5 mL  0.5 mL Intramuscular Tomorrow-1000 Jackelyn PolingJason A Berry, NP      . insulin aspart (novoLOG) injection 0-8 Units  0-8 Units Subcutaneous TID WC & HS Sanjuana KavaAgnes I Nwoko, NP   6 Units at 12/23/16 0818  . insulin aspart (novoLOG) injection 8 Units  8 Units Subcutaneous Once Antonieta PertGreg Lawson Trystyn Sitts, MD   Stopped at 12/23/16 0830  . linagliptin (TRADJENTA) tablet 5 mg  5 mg Oral Daily Antonieta PertGreg Lawson Teya Otterson, MD   5 mg at 12/23/16 0813  . loperamide (IMODIUM) capsule 2-4 mg  2-4 mg Oral PRN Laveda AbbeLaurie Britton Parks, NP   4 mg at  12/21/16 0216  . magnesium hydroxide (MILK OF MAGNESIA) suspension 30 mL  30 mL Oral Daily PRN Laveda AbbeLaurie Britton Parks, NP      . methocarbamol (ROBAXIN) tablet 500 mg  500 mg Oral Q8H PRN Laveda AbbeLaurie Britton Parks, NP   500 mg at 12/22/16 1216  . naproxen (NAPROSYN) tablet 500 mg  500 mg Oral BID PRN Laveda AbbeLaurie Britton Parks, NP   500 mg at 12/22/16 1722  . nicotine (NICODERM CQ - dosed in mg/24 hours) patch 21 mg  21 mg Transdermal Daily Jackelyn PolingJason A Berry, NP   21 mg at 12/23/16 [id number]  . ondansetron (ZOFRAN-ODT) disintegrating tablet 4 mg  4 mg Oral Q6H PRN Laveda AbbeLaurie Britton Parks, NP   4 mg at 12/22/16 1722  . QUEtiapine (SEROQUEL) tablet 100 mg  100 mg Oral QHS Antonieta PertGreg Lawson Olie Dibert, MD      . terbinafine (LAMISIL) 1 % cream   Topical BID Jackelyn PolingJason A Berry, NP      . traZODone (DESYREL) tablet 50 mg  50 mg Oral QHS PRN Laveda AbbeLaurie Britton Parks, NP   50 mg at 12/23/16 0158    Lab Results:  Results for orders placed or performed during the hospital encounter of 12/21/16 (from the past 48 hour(s))  Glucose, capillary     Status: Abnormal   Collection Time: 12/21/16 11:43 AM  Result Value Ref Range   Glucose-Capillary 259 (H) 65 - 99 mg/dL  Glucose, capillary     Status: Abnormal   Collection Time: 12/21/16  4:35 PM  Result Value Ref Range   Glucose-Capillary 195 (H) 65 - 99 mg/dL  Glucose, capillary     Status: Abnormal   Collection Time: 12/21/16  9:37 PM  Result Value Ref Range   Glucose-Capillary 188 (H) 65 - 99 mg/dL  Glucose, capillary     Status: Abnormal   Collection Time: 12/22/16  5:52 AM  Result Value Ref Range   Glucose-Capillary 189 (H) 65 - 99 mg/dL   Comment 1 Notify RN   Glucose, capillary     Status: Abnormal   Collection Time: 12/22/16 11:54 AM  Result Value Ref Range   Glucose-Capillary 262 (H) 65 - 99 mg/dL  Glucose, capillary     Status: Abnormal   Collection Time: 12/22/16  4:59 PM  Result Value Ref Range   Glucose-Capillary 256 (H) 65 - 99 mg/dL   Comment 1 Notify RN    Comment 2  Document in Chart   Glucose, capillary     Status: Abnormal   Collection Time: 12/22/16  8:20 PM  Result Value Ref Range   Glucose-Capillary 228 (H) 65 - 99 mg/dL  Glucose, capillary     Status: Abnormal   Collection Time: 12/23/16  8:08 AM  Result Value Ref Range   Glucose-Capillary 368 (H) 65 - 99 mg/dL  Glucose, capillary     Status: Abnormal   Collection Time: 12/23/16  9:18 AM  Result Value Ref Range   Glucose-Capillary 247 (H) 65 - 99 mg/dL   Comment 1 Notify RN     Blood Alcohol level:  Lab Results  Component Value Date   ETH <5 12/20/2016   ETH <5 05/01/2016    Metabolic Disorder Labs: No results found for: HGBA1C, MPG No results found for: PROLACTIN No results found for: CHOL, TRIG, HDL, CHOLHDL, VLDL, LDLCALC  Physical Findings: AIMS: Facial and Oral Movements Muscles of Facial Expression: None, normal Lips and Perioral Area: None, normal Jaw: None, normal Tongue: None, normal,Extremity Movements Upper (arms, wrists, hands, fingers): None, normal Lower (legs, knees, ankles, toes): None, normal, Trunk Movements Neck, shoulders, hips: None, normal, Overall Severity Severity of abnormal movements (highest score from questions above): None, normal Incapacitation due to abnormal movements: None, normal Patient's awareness of abnormal movements (rate only patient's report): No Awareness, Dental Status Current problems with teeth and/or dentures?: No Does patient usually wear dentures?: No  CIWA:  CIWA-Ar Total: 11 COWS:  COWS Total Score: 3  Musculoskeletal: Strength & Muscle Tone: within normal limits Gait & Station: normal Patient leans: N/A  Psychiatric Specialty Exam: Physical Exam  ROS  Blood pressure (!) 143/98, pulse 79, temperature 98.4 F (36.9 C), temperature source Oral, resp. rate 20, height 5\' 11"  (1.803 m), weight 88.9 kg (196 lb), SpO2 99 %.Body mass index is 27.34 kg/m.  General Appearance: Disheveled  Eye Contact:  Fair  Speech:  Clear  and Coherent  Volume:  Normal  Mood:  Anxious and Dysphoric  Affect:  Congruent  Thought Process:  Goal Directed  Orientation:  Full (Time, Place, and Person)  Thought Content:  Logical  Suicidal Thoughts:  No  Homicidal Thoughts:  No  Memory:  Immediate;   Fair  Judgement:  Impaired  Insight:  Fair  Psychomotor Activity:  Restlessness  Concentration:  Concentration: Fair  Recall:  [employer] of Knowledge:  Fair  Language:  Fair  Akathisia:  No  Handed:  Right  AIMS (if indicated):     Assets:  Desire for Improvement  ADL's:  Intact  Cognition:  WNL  Sleep:  Number of Hours: 4.5     Treatment Plan Summary: Daily contact with patient to assess and evaluate symptoms and progress in treatment, Medication management and Plan 1) patient at this point continues a stable withdrawal, does request additional medication for sleep. Will add seroquel 100 mg po q hs given liver issues, 2) no other changes to his psych meds at this point, 3) physical issues are also of concern. Amylase was significantly elevated on admission. Patient unaware of any history of pancreatitis. Previous CT in 2017 showed cirrhorsis, no mention of pancreatic disease. May be related to DM2 poor control. Also thrombocytopenia with platelets dropping from 85K to 48 K recently. Will check PT/INR and repeat platelet count. Patient had Harvoni for Hep C a couple of years ago and was reportedly a clinical cure. He has used IV drugs since then so will repeat liver panel. Will discuss possiblity of adding an HIV. BP is elevated but probably due to withdrawal at this point. Continue clonidine. BS control is slowly improving  Antonieta Pert, MD 12/23/2016, 11:29 AM

## 2016-12-23 NOTE — Progress Notes (Signed)
Recreation Therapy Notes  Animal-Assisted Activity (AAA) Program Checklist/Progress Notes Patient Eligibility Criteria Checklist & Daily Group note for Rec TxIntervention  Date: 02.20.2018 Time: 2:45pm [address]    AAA/T Program Assumption of Risk Form signed by Patient/ or Parent Legal Guardian Yes  Patient is free of allergies or sever asthma Yes  Patient reports no fear of animals Yes  Patient reports no history of cruelty to animals Yes  Patient understands his/her participation is voluntary Yes  Patient washes hands before animal contact Yes  Patient washes hands after animal contact Yes  Behavioral Response: Engaged, Appropriate   Education:Hand Washing, Appropriate Animal Interaction   Education Outcome: Acknowledges education.   Clinical Observations/Feedback: Patient attended session and interacted appropriately with therapy dog and peers.    Marykay Lexenise L Poet Hineman, LRT/CTRS        Pamelyn Bancroft L 12/23/2016 3:00 PM

## 2016-12-23 NOTE — Progress Notes (Signed)
Inpatient Diabetes Program Recommendations  AACE/ADA: New Consensus Statement on Inpatient Glycemic Control (2015)  Target Ranges:  Prepandial:   less than 140 mg/dL      Peak postprandial:   less than 180 mg/dL (1-2 hours)      Critically ill patients:  140 - 180 mg/dL   Lab Results  Component Value Date   GLUCAP 368 (H) 12/23/2016    Review of Glycemic Control  Diabetes history: DM2 Outpatient Diabetes medications: Novolog 2-8 units tidwc, previously on metformin, tradjenta and [place]. Current orders for Inpatient glycemic control: Novolog 0-8 units tidwc, tradjenta 5 mg QD HgbA1C pending  Inpatient Diabetes Program Recommendations:    Add Lantus 12 units QHS Change Novolog s/s to the Glycemic Control Orders - Novolog 0-9 units tidwc and hs.  Add meal coverage insulin if post-prandials > 180 mg/dL - Novolog 3 units tidwc  Will continue to follow.  Thank you. Ailene Ardshonda Messiah Rovira, RD, LDN, CDE Inpatient Diabetes Coordinator [phone]

## 2016-12-23 NOTE — Progress Notes (Signed)
Pt mentioned he did not have a good day because he got bad medical news. Pt wants to get sleep tonight because he have not been able to sleep the past 4 days.

## 2016-12-23 NOTE — [facility] Group Notes (Signed)
Adult Psychoeducational Group Note  Date:  12/23/2016 Time:  4:19 AM  Group Topic/Focus:  Wrap-Up Group:   The focus of this group is to help patients review their daily goal of treatment and discuss progress on daily workbooks.  Participation Level:  Did Not Attend  Participation Quality:  DNA  Affect:  DNA  Cognitive:  DNA  Insight: None  Engagement in Group:  DNA  Modes of Intervention:  DNA  Additional Comments:  DNA  Jamas LavBailey, Reeya Bound W 12/23/2016, 4:19 AM

## 2016-12-23 NOTE — Progress Notes (Signed)
DAR NOTE: Patient presents with irritable affect and mood.  Denies pain, auditory and visual hallucinations.  Reports poor night sleep due to withdrawal symptoms.  Rates depression at 8, hopelessness at 0, and anxiety at 8.  Maintained on routine safety checks.  Medications given as prescribed.  Support and encouragement offered as needed.  Patient withdrawn and isolate to his room most of this shift.  Reports withdrawal symptoms of cravings, irritability and agitation.  Vistaril 25 mg given for complain of anxiety with good effect.  Food and fluid intake encouraged due to poor appetite.

## 2016-12-24 ENCOUNTER — Encounter (HOSPITAL_COMMUNITY): Payer: Self-pay | Admitting: Psychiatry

## 2016-12-24 LAB — GLUCOSE, CAPILLARY
GLUCOSE-CAPILLARY: 273 mg/dL — AB (ref 65–99)
GLUCOSE-CAPILLARY: 293 mg/dL — AB (ref 65–99)
Glucose-Capillary: 331 mg/dL — ABNORMAL HIGH (ref 65–99)
Glucose-Capillary: 207 mg/dL — ABNORMAL HIGH (ref 65–99)

## 2016-12-24 LAB — HEMOGLOBIN A1C
Hgb A1c MFr Bld: 8 % — ABNORMAL HIGH (ref 4.8–5.6)
MEAN PLASMA GLUCOSE: 183

## 2016-12-24 MED ORDER — LORAZEPAM 2 MG/ML IJ SOLN
2.0000 mg | Freq: Once | INTRAMUSCULAR | Status: AC
  Administered 2016-12-24: 2 mg via INTRAMUSCULAR
  Filled 2016-12-24: qty 1

## 2016-12-24 MED ORDER — DIPHENHYDRAMINE HCL 50 MG PO CAPS
50.0000 mg | ORAL_CAPSULE | Freq: Once | ORAL | Status: AC
  Administered 2016-12-24: 50 mg via ORAL
  Filled 2016-12-24: qty 2
  Filled 2016-12-24: qty 1

## 2016-12-24 NOTE — Progress Notes (Signed)
Recreation Therapy Notes  Date: 12/24/16 Time: 0930 Location: [address] Group Room  Group Topic: Stress Management  Goal Area(s) Addresses:  Patient will verbalize importance of using healthy stress management.  Patient will identify positive emotions associated with healthy stress management.   Intervention: Stress Management  Activity :  Meditation.  LRT introduced the stress management technique of meditation.  LRT played a meditation from the Calm app to allows patients to engage and learn the benefits of meditation.  Patiens were to follow along as the meditation played to engage in the technique.  Education:  Stress Management, Discharge Planning.   Education Outcome: Acknowledges edcuation/In group clarification offered/Needs additional education  Clinical Observations/Feedback: Pt did not attend group.    Chase Reed, LRT/CTRS         Chase Reed A 12/24/2016 12:41 PM 

## 2016-12-24 NOTE — Progress Notes (Addendum)
D:  Patient's self inventory sheet, patient has poor sleep, sleep medication not helpful.  Poor appetite, low energy level, poor concentration.  Rated depression 8, rated hopeless and anxiety #10.  Withdrawals, tremors, sedation, diarrhea, chilling, cravings, cramping, agitation, nausea, runny nose, irritability, sleep deprivation, evil.  Denied SI.  Physical problems, diabetes, crihossis, pancreatitis.  Low platelets. A:  Medications administered per MD orders.  Emotional support and encouragement given patient. R:  Patient stated he does sees swizzley things, not really SI and HI, contracts for safety.  Denied voices.  Safety maintained with 15 minute checks.

## 2016-12-24 NOTE — Progress Notes (Signed)
  [facility] :  Will you be returning to the same living situation after discharge:  Yes,  returning home with friend. given [facility] as well. not interested in inpatient treatment. At discharge, do you have transportation home?: Yes,  bus or friend--pt scheduled for discharge on Thursday 12/25/16 after lunch. Do you have the ability to pay for your medications: Yes,  [employer] Medicaid  Release of information consent forms completed and submitted to medical records by CSW.  Patient to Follow up at: Follow-up Information    [facility]. Go to.   Specialty:  Behavioral Health Why:  Please go for a walk-in appointment within 1-3 days of discharge to be established for outpatient services. Walk-in hours are Mon-Fri 8-3:30p. Please arrive as early as possible to be sure you are seen. Contact information: [address] [place] [address] [phone]           Next level of care provider has [facility] Link:no  Safety Planning and Suicide Prevention discussed: Yes,  SPE completed with pt; pt declined to consent to family contact. SPI pamphlet and Mobile Crisis information provided to pt.   Have you used any form of tobacco in the last 30 days? (Cigarettes, Smokeless Tobacco, Cigars, and/or Pipes): Yes  Has patient been referred to the Quitline?: Patient refused referral  Patient has been referred for addiction treatment: Yes  Austyn Perriello N Smart LCSW 12/24/2016, 4:06 PM

## 2016-12-24 NOTE — [facility] Group Notes (Signed)
[facility] LCSW Group Therapy  12/24/2016 4:05 PM  Type of Therapy:  Group Therapy  Participation Level:  Did Not Attend-pt invited. Chose to remain in bed.   Summary of Progress/Problems: Today's Topic: Overcoming Obstacles. Patients identified one short term goal and potential obstacles in reaching this goal. Patients processed barriers involved in overcoming these obstacles. Patients identified steps necessary for overcoming these obstacles and explored motivation (internal and external) for facing these difficulties head on.   Chase Schetter N Smart LCSW 12/24/2016, 4:05 PM

## 2016-12-24 NOTE — Tx Team (Signed)
Interdisciplinary Treatment and Diagnostic Plan Update 12/24/2016 Time of Session: 9:30am  Chase Reed  MRN: [id number]  Principal Diagnosis: Opiate withdrawal [facility])  Secondary Diagnoses: Principal Problem:   Opiate withdrawal ([place]) Active Problems:   Tinea cruris   Substance induced mood disorder ([place])   Current Medications:  Current Facility-Administered Medications  Medication Dose Route Frequency Provider Last Rate Last Dose  . alum & mag hydroxide-simeth (MAALOX/MYLANTA) 200-200-20 MG/5ML suspension 30 mL  30 mL Oral Q4H PRN Ethelene Hal, NP      . atorvastatin (LIPITOR) tablet 10 mg  10 mg Oral q1800 Sharma Covert, MD   10 mg at 12/23/16 1823  . buPROPion (WELLBUTRIN SR) 12 hr tablet 150 mg  150 mg Oral Daily Sharma Covert, MD   150 mg at 12/24/16 0759  . cloNIDine (CATAPRES) tablet 0.1 mg  0.1 mg Oral BH-qamhs Ethelene Hal, NP   0.1 mg at 12/24/16 0801   Followed by  . [START ON 12/26/2016] cloNIDine (CATAPRES) tablet 0.1 mg  0.1 mg Oral QAC breakfast Ethelene Hal, NP      . dicyclomine (BENTYL) tablet 20 mg  20 mg Oral Q6H PRN Ethelene Hal, NP   20 mg at 12/22/16 1722  . hydrOXYzine (ATARAX/VISTARIL) tablet 25 mg  25 mg Oral Q6H PRN Ethelene Hal, NP   25 mg at 12/24/16 1055  . Influenza vac split quadrivalent PF (FLUARIX) injection 0.5 mL  0.5 mL Intramuscular Tomorrow-1000 Rozetta Nunnery, NP      . insulin aspart (novoLOG) injection 0-8 Units  0-8 Units Subcutaneous TID PC & HS Sharma Covert, MD   2 Units at 12/24/16 1317  . linagliptin (TRADJENTA) tablet 5 mg  5 mg Oral Daily Sharma Covert, MD   5 mg at 12/24/16 0800  . loperamide (IMODIUM) capsule 2-4 mg  2-4 mg Oral PRN Ethelene Hal, NP   4 mg at 12/21/16 0216  . magnesium hydroxide (MILK OF MAGNESIA) suspension 30 mL  30 mL Oral Daily PRN Ethelene Hal, NP      . methocarbamol (ROBAXIN) tablet 500 mg  500 mg Oral Q8H PRN Ethelene Hal, NP   500  mg at 12/24/16 1055  . naproxen (NAPROSYN) tablet 500 mg  500 mg Oral BID PRN Ethelene Hal, NP   500 mg at 12/24/16 1053  . nicotine (NICODERM CQ - dosed in mg/24 hours) patch 21 mg  21 mg Transdermal Daily Rozetta Nunnery, NP   21 mg at 12/24/16 0802  . ondansetron (ZOFRAN-ODT) disintegrating tablet 4 mg  4 mg Oral Q6H PRN Ethelene Hal, NP   4 mg at 12/22/16 1722  . risperiDONE (RISPERDAL M-TABS) disintegrating tablet 1 mg  1 mg Oral QHS Laverle Hobby, PA-C   1 mg at 12/23/16 2325  . terbinafine (LAMISIL) 1 % cream   Topical BID Rozetta Nunnery, NP      . topiramate (TOPAMAX) tablet 50 mg  50 mg Oral BID Laverle Hobby, PA-C   50 mg at 12/24/16 0803    PTA Medications: Prescriptions Prior to Admission  Medication Sig Dispense Refill Last Dose  . gabapentin (NEURONTIN) 300 MG capsule Take 1 capsule (300 mg total) by mouth at bedtime. (Patient taking differently: Take 300 mg by mouth 4 (four) times daily as needed. )   unknown  . insulin aspart (NOVOLOG) 100 UNIT/ML injection Inject 2-8 Units into the skin See admin instructions. Sliding scale.over  250--2 units over 300--4 units over 350--6 units over 400--8 units Up to 50 units daily. 1 mL 0 unknown  . atorvastatin (LIPITOR) 10 MG tablet Take 1 tablet (10 mg total) by mouth daily. (Patient not taking: Reported on 12/22/2016)   Not Taking at Unknown time  . dicyclomine (BENTYL) 20 MG tablet Take 1 tablet (20 mg total) by mouth 2 (two) times daily. (Patient not taking: Reported on 12/22/2016) 20 tablet 0 Not Taking at Unknown time  . doxepin (SINEQUAN) 10 MG capsule Take 1 capsule (10 mg total) by mouth at bedtime. (Patient not taking: Reported on 12/22/2016)   Not Taking at Unknown time  . insulin glargine (LANTUS) 100 UNIT/ML injection Inject 0.15 mLs (15 Units total) into the skin daily. (Maximum of 45 units daily.) (Patient not taking: Reported on 12/22/2016) 1 mL 0 Not Taking at Unknown time  . metFORMIN (GLUCOPHAGE) 1000 MG  tablet Take 1 tablet (1,000 mg total) by mouth 2 (two) times daily with a meal. (Patient not taking: Reported on 12/22/2016)   Not Taking at Unknown time  . methadone (DOLOPHINE) 10 MG/ML solution Take 13.5 mLs (135 mg total) by mouth daily. Per methadone clinic (Patient not taking: Reported on 12/22/2016)   Not Taking at Unknown time  . promethazine (PHENERGAN) 25 MG tablet Take 1 tablet (25 mg total) by mouth every 6 (six) hours as needed for nausea or vomiting. (Patient not taking: Reported on 12/22/2016) 12 tablet 0 Not Taking at Unknown time  . sitaGLIPtin (JANUVIA) 100 MG tablet Take 1 tablet (100 mg total) by mouth daily. (Patient not taking: Reported on 12/22/2016)   Not Taking at Unknown time    Treatment Modalities: Medication Management, Group therapy, Case management,  1 to 1 session with clinician, Psychoeducation, Recreational therapy.  Patient Stressors: Financial difficulties Loss of housing, family Occupational concerns Substance abuse Patient Strengths: Ability for insight Capable of independent living Agricultural engineer for treatment/growth Supportive family/friends  Physician Treatment Plan for Primary Diagnosis: Opiate withdrawal ([place]) Long Term Goal(s): Improvement in symptoms so as ready for discharge Short Term Goals: Ability to identify changes in lifestyle to reduce recurrence of condition will improve Ability to verbalize feelings will improve Ability to demonstrate self-control will improve Ability to identify and develop effective coping behaviors will improve Ability to maintain clinical measurements within normal limits will improve Compliance with prescribed medications will improve Ability to identify triggers associated with substance abuse/mental health issues will improve Ability to identify changes in lifestyle to reduce recurrence of condition will improve Ability to verbalize feelings will improve Ability to demonstrate self-control will  improve Ability to identify and develop effective coping behaviors will improve Ability to maintain clinical measurements within normal limits will improve Compliance with prescribed medications will improve Ability to identify triggers associated with substance abuse/mental health issues will improve  Medication Management: Evaluate patient's response, side effects, and tolerance of medication regimen.  Therapeutic Interventions: 1 to 1 sessions, Unit Group sessions and Medication administration.  Evaluation of Outcomes: Not Met  Physician Treatment Plan for Secondary Diagnosis: Principal Problem:   Opiate withdrawal ([place]) Active Problems:   Tinea cruris   Substance induced mood disorder ([place])  Long Term Goal(s): Improvement in symptoms so as ready for discharge  Short Term Goals: Ability to identify changes in lifestyle to reduce recurrence of condition will improve Ability to verbalize feelings will improve Ability to demonstrate self-control will improve Ability to identify and develop effective coping behaviors will improve Ability to maintain clinical  measurements within normal limits will improve Compliance with prescribed medications will improve Ability to identify triggers associated with substance abuse/mental health issues will improve Ability to identify changes in lifestyle to reduce recurrence of condition will improve Ability to verbalize feelings will improve Ability to demonstrate self-control will improve Ability to identify and develop effective coping behaviors will improve Ability to maintain clinical measurements within normal limits will improve Compliance with prescribed medications will improve Ability to identify triggers associated with substance abuse/mental health issues will improve  Medication Management: Evaluate patient's response, side effects, and tolerance of medication regimen.  Therapeutic Interventions: 1 to 1 sessions, Unit Group sessions  and Medication administration.  Evaluation of Outcomes: Met/Adequate for discharge   RN Treatment Plan for Primary Diagnosis: Opiate withdrawal ([place]) Long Term Goal(s): Knowledge of disease and therapeutic regimen to maintain health will improve  Short Term Goals: Ability to verbalize frustration and anger appropriately will improve, Ability to identify and develop effective coping behaviors will improve and Compliance with prescribed medications will improve  Medication Management: RN will administer medications as ordered by provider, will assess and evaluate patient's response and provide education to patient for prescribed medication. RN will report any adverse and/or side effects to prescribing provider.  Therapeutic Interventions: 1 on 1 counseling sessions, Psychoeducation, Medication administration, Evaluate responses to treatment, Monitor vital signs and CBGs as ordered, Perform/monitor CIWA, COWS, AIMS and Fall Risk screenings as ordered, Perform wound care treatments as ordered.  Evaluation of Outcomes: Met  LCSW Treatment Plan for Primary Diagnosis: Opiate withdrawal ([place]) Long Term Goal(s): Safe transition to appropriate next level of care at discharge, Engage patient in therapeutic group addressing interpersonal concerns. Short Term Goals: Engage patient in aftercare planning with referrals and resources, Facilitate patient progression through stages of change regarding substance use diagnoses and concerns, Identify triggers associated with mental health/substance abuse issues and Increase skills for wellness and recovery  Therapeutic Interventions: Assess for all discharge needs, 1 to 1 time with Social worker, Explore available resources and support systems, Assess for adequacy in community support network, Educate family and significant other(s) on suicide prevention, Complete Psychosocial Assessment, Interpersonal group therapy.  Evaluation of Outcomes: Met  Progress in  Treatment: Attending groups: No  Participating in groups: No  Taking medication as prescribed: Yes,  Toleration medication: Yes, no side effects reported at this time Family/Significant other contact made: No, pt declined at this time. Patient understands diagnosis: Continuing to assess Discussing patient identified problems/goals with staff: Yes Medical problems stabilized or resolved: Yes Denies suicidal/homicidal ideation: Yes Issues/concerns per patient self-inventory: None Other: N/A  New problem(s) identified: None identified at this time.   New Short Term/Long Term Goal(s): None identified at this time.   Discharge Plan or Barriers: Pt plans to return to friend's home and has [facility] list. Pt declined referral for inpatient treatment and chose to follow-up at [facility]. Given list of NA/AA meetings and [place] information.   Reason for Continuation of Hospitalization:  Medication management   Estimated Length of Stay: 1 day (pt scheduled for discharge on Thursday 12/25/16 after lunch).   Attendees: Patient: 12/24/2016 4:08 PM  Physician: Dr. Mallie Darting MD 12/24/2016 4:08 PM  Nursing: Quintin Alto RN 12/24/2016 4:08 PM  RN Care Manager: Lars Pinks, RN 12/24/2016 4:08 PM  Social Worker: Press photographer, LCSW; Matthew Saras, [place] 12/24/2016 4:08 PM  Recreational Therapist:  12/24/2016 4:08 PM  Other: Lindell Spar, NP 12/24/2016 4:08 PM  Other:  12/24/2016 4:08 PM  Other: 12/24/2016  4:08 PM  Scribe for Treatment Team: Georga Kaufmann, MSW,LCSWA 12/24/2016 4:08 PM

## 2016-12-24 NOTE — Progress Notes (Signed)
[facility] MD Progress Note  12/24/2016 12:00 PM Chase Reed  MRN:  [id number] Subjective:  Patient is a 51 YO male Principal Problem: Opiate withdrawal (HCC) Diagnosis:   Patient Active Problem List   Diagnosis Date Noted  . Opiate withdrawal (HCC) [F11.23] 12/21/2016  . Tinea cruris [B35.6] 12/21/2016  . Substance induced mood disorder (HCC) [F19.94] 12/21/2016  . 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 Elevated lipase [R74.8] 05/02/2016   Total Time spent with patient: 20 minutes  Past Psychiatric History: see H&P  Past Medical History:  Past Medical History:  Diagnosis Date  . Cirrhosis (HCC)   . Diabetes mellitus without complication (HCC)   . Hepatitis     Past Surgical History:  Procedure Laterality Date  . LITHOTRIPSY     Family History: History reviewed. No pertinent family history. Family Psychiatric  History: see H&P Social History:  History  Alcohol Use  . Yes    Comment: rarely      History  Drug Use  . Types: Heroin    Social History   Social History  . Marital status: Married    Spouse name: N/A  . Number of children: N/A  . Years of education: N/A   Social History Main Topics  . Smoking status: Current Every Day Smoker    Packs/day: 0.50    Years: 30.00  . Smokeless tobacco: Former Neurosurgeon  . Alcohol use Yes     Comment: rarely   . Drug use: Yes    Types: Heroin  . Sexual activity: No   Other Topics Concern  . None   Social History Narrative  . None   Additional Social History:    Pain Medications: denies Prescriptions: Novolog, Neurontin Over the Counter: denies History of alcohol / drug use?: Yes Longest period of sobriety (when/how long): 13 years Negative Consequences of Use: Financial, Personal relationships, Work / Programmer, multimedia Withdrawal Symptoms: Agitation, Diarrhea, Nausea / Vomiting,  Tremors Name of Substance 1: heroin 1 - Age of First Use: 20s 1 - Amount (size/oz): 1/2-1 gram daily 1 - Frequency: daily 1 - Duration: since july 2017 1 - Last Use / Amount: 12/20/2016                  Sleep: Fair  Appetite:  Fair  Current Medications: Current Facility-Administered Medications  Medication Dose Route Frequency Provider Last Rate Last Dose  . alum & mag hydroxide-simeth (MAALOX/MYLANTA) 200-200-20 MG/5ML suspension 30 mL  30 mL Oral Q4H PRN Laveda Abbe, NP      . atorvastatin (LIPITOR) tablet 10 mg  10 mg Oral q1800 Antonieta Pert, MD   10 mg at 12/23/16 1823  . buPROPion (WELLBUTRIN SR) 12 hr tablet 150 mg  150 mg Oral Daily Antonieta Pert, MD   150 mg at 12/24/16 0759  . cloNIDine (CATAPRES) tablet 0.1 mg  0.1 mg Oral BH-qamhs Laveda Abbe, NP   0.1 mg at 12/24/16 0801   Followed by  . [START ON 12/26/2016] cloNIDine (CATAPRES) tablet 0.1 mg  0.1 mg Oral QAC breakfast Laveda Abbe, NP      . dicyclomine (BENTYL) tablet 20 mg  20 mg Oral Q6H PRN Laveda Abbe, NP   20 mg at 12/22/16 1722  . hydrOXYzine (ATARAX/VISTARIL) tablet 25 mg  25 mg Oral Q6H PRN Laveda Abbe, NP   25 mg at 12/24/16 1055  .  Influenza vac split quadrivalent PF (FLUARIX) injection 0.5 mL  0.5 mL Intramuscular Tomorrow-1000 Jackelyn Poling, NP      . insulin aspart (novoLOG) injection 0-8 Units  0-8 Units Subcutaneous TID PC & HS Antonieta Pert, MD   4 Units at 12/24/16 0900  . linagliptin (TRADJENTA) tablet 5 mg  5 mg Oral Daily Antonieta Pert, MD   5 mg at 12/24/16 0800  . loperamide (IMODIUM) capsule 2-4 mg  2-4 mg Oral PRN Laveda Abbe, NP   4 mg at 12/21/16 0216  . magnesium hydroxide (MILK OF MAGNESIA) suspension 30 mL  30 mL Oral Daily PRN Laveda Abbe, NP      . methocarbamol (ROBAXIN) tablet 500 mg  500 mg Oral Q8H PRN Laveda Abbe, NP   500 mg at 12/24/16 1055  . naproxen (NAPROSYN) tablet 500 mg  500 mg Oral BID  PRN Laveda Abbe, NP   500 mg at 12/24/16 1053  . nicotine (NICODERM CQ - dosed in mg/24 hours) patch 21 mg  21 mg Transdermal Daily Jackelyn Poling, NP   21 mg at 12/24/16 0802  . ondansetron (ZOFRAN-ODT) disintegrating tablet 4 mg  4 mg Oral Q6H PRN Laveda Abbe, NP   4 mg at 12/22/16 1722  . risperiDONE (RISPERDAL M-TABS) disintegrating tablet 1 mg  1 mg Oral QHS Kerry Hough, PA-C   1 mg at 12/23/16 2325  . terbinafine (LAMISIL) 1 % cream   Topical BID Jackelyn Poling, NP      . topiramate (TOPAMAX) tablet 50 mg  50 mg Oral BID Kerry Hough, PA-C   50 mg at 12/24/16 1610    Lab Results:  Results for orders placed or performed during the hospital encounter of 12/21/16 (from the past 48 hour(s))  Glucose, capillary     Status: Abnormal   Collection Time: 12/22/16  4:59 PM  Result Value Ref Range   Glucose-Capillary 256 (H) 65 - 99 mg/dL   Comment 1 Notify RN    Comment 2 Document in Chart   Hemoglobin A1c     Status: Abnormal   Collection Time: 12/22/16  6:23 PM  Result Value Ref Range   Hgb A1c MFr Bld 8.0 (H) 4.8 - 5.6 %    Comment: (NOTE)         Pre-diabetes: 5.7 - 6.4         Diabetes: >6.4         Glycemic control for adults with diabetes: <7.0    Mean Plasma Glucose 183     Comment: (NOTE) Performed At: [facility] [address] [place], [place] [id number] Mila Homer MD [id number] Performed at [facility], [address] [place], [place] [address]   Glucose, capillary     Status: Abnormal   Collection Time: 12/22/16  8:20 PM  Result Value Ref Range   Glucose-Capillary 228 (H) 65 - 99 mg/dL  Glucose, capillary     Status: Abnormal   Collection Time: 12/23/16  8:08 AM  Result Value Ref Range   Glucose-Capillary 368 (H) 65 - 99 mg/dL  Glucose, capillary     Status: Abnormal   Collection Time: 12/23/16  9:18 AM  Result Value Ref Range   Glucose-Capillary 247 (H) 65 - 99 mg/dL   Comment 1 Notify RN   Glucose,  capillary     Status: Abnormal   Collection Time: 12/23/16 12:06 PM  Result Value Ref Range  Glucose-Capillary 173 (H) 65 - 99 mg/dL   Comment 1 Notify RN    Comment 2 Document in Chart   Glucose, capillary     Status: Abnormal   Collection Time: 12/23/16  4:51 PM  Result Value Ref Range   Glucose-Capillary 248 (H) 65 - 99 mg/dL  CBC with Differential/Platelet     Status: Abnormal   Collection Time: 12/23/16  6:17 PM  Result Value Ref Range   WBC 6.3 4.0 - 10.5 K/uL   RBC 5.02 4.22 - 5.81 MIL/uL   Hemoglobin 15.2 13.0 - 17.0 g/dL   HCT 16.142.7 09.639.0 - 04.552.0 %   MCV 85.1 78.0 - 100.0 fL   MCH 30.3 26.0 - 34.0 pg   MCHC 35.6 30.0 - 36.0 g/dL   RDW 40.913.2 81.111.5 - 91.415.5 %   Platelets 68 (L) 150 - 400 K/uL    Comment: REPEATED TO VERIFY SPECIMEN CHECKED FOR CLOTS PLATELET COUNT CONFIRMED BY SMEAR    Neutrophils Relative % 68 %   Lymphocytes Relative 25 %   Monocytes Relative 6 %   Eosinophils Relative 1 %   Basophils Relative 0 %   Neutro Abs 4.2 1.7 - 7.7 K/uL   Lymphs Abs 1.6 0.7 - 4.0 K/uL   Monocytes Absolute 0.4 0.1 - 1.0 K/uL   Eosinophils Absolute 0.1 0.0 - 0.7 K/uL   Basophils Absolute 0.0 0.0 - 0.1 K/uL   Smear Review MORPHOLOGY UNREMARKABLE     Comment: Performed at [facility], [address] [place], [place] [address]  Amylase     Status: None   Collection Time: 12/23/16  6:17 PM  Result Value Ref Range   Amylase 57 28 - 100 U/L    Comment: Performed at [facility], [address] [place], [place] [address]  Lipase, blood     Status: None   Collection Time: 12/23/16  6:17 PM  Result Value Ref Range   Lipase 41 11 - 51 U/L    Comment: Performed at [facility], [address] [place], [place] [address]  Protime-INR     Status: None   Collection Time: 12/23/16  6:17 PM  Result Value Ref Range   Prothrombin Time 13.1 11.4 - 15.2 seconds   INR 0.99     Comment: Performed at [facility],  [address] [place], [place] [address]  Glucose, capillary     Status: Abnormal   Collection Time: 12/23/16  8:44 PM  Result Value Ref Range   Glucose-Capillary 217 (H) 65 - 99 mg/dL   Comment 1 Notify RN   Glucose, capillary     Status: Abnormal   Collection Time: 12/24/16  8:54 AM  Result Value Ref Range   Glucose-Capillary 331 (H) 65 - 99 mg/dL   Comment 1 Notify RN     Blood Alcohol level:  Lab Results  Component Value Date   ETH <5 12/20/2016   ETH <5 05/01/2016    Metabolic Disorder Labs: Lab Results  Component Value Date   HGBA1C 8.0 (H) 12/22/2016   MPG 183 12/22/2016   No results found for: PROLACTIN No results found for: CHOL, TRIG, HDL, CHOLHDL, VLDL, LDLCALC  Physical Findings: AIMS: Facial and Oral Movements Muscles of Facial Expression: None, normal Lips and Perioral Area: None, normal Jaw: None, normal Tongue: None, normal,Extremity Movements Upper (arms, wrists, hands, fingers): None, normal Lower (legs, knees, ankles, toes): None, normal, Trunk Movements Neck, shoulders, hips: None, normal, Overall  Severity Severity of abnormal movements (highest score from questions above): None, normal Incapacitation due to abnormal movements: None, normal Patient's awareness of abnormal movements (rate only patient's report): No Awareness, Dental Status Current problems with teeth and/or dentures?: No Does patient usually wear dentures?: No  CIWA:  CIWA-Ar Total: 5 COWS:  COWS Total Score: 3  Musculoskeletal: Strength & Muscle Tone: within normal limits Gait & Station: normal Patient leans: Right and N/A  Psychiatric Specialty Exam: Physical Exam  ROS  Blood pressure (!) 140/94, pulse 68, temperature 98.2 F (36.8 C), temperature source Oral, resp. rate 17, height 5\' 11"  (1.803 m), weight 88.9 kg (196 lb), SpO2 99 %.Body mass index is 27.34 kg/m.  General Appearance: Disheveled  Eye Contact:  Fair  Speech:  Clear and Coherent  Volume:  Normal   Mood:  Anxious  Affect:  Appropriate  Thought Process:  Coherent  Orientation:  Full (Time, Place, and Person)  Thought Content:  Logical  Suicidal Thoughts:  No  Homicidal Thoughts:  No  Memory:  Immediate;   Fair  Judgement:  Fair  Insight:  Fair  Psychomotor Activity:  Normal  Concentration:  Concentration: Good  Recall:  Good  Fund of Knowledge:  Fair  Language:  Good  Akathisia:  No  Handed:  Right  AIMS (if indicated):     Assets:  Communication Skills Desire for Improvement  ADL's:  Intact  Cognition:  WNL  Sleep:  Number of Hours: 0     Treatment Plan Summary: Daily contact with patient to assess and evaluate symptoms and progress in treatment, Medication management and Plan 1) patient's mood is improving and he reports minimal withdrawal symptoms. Blood sugar is improving and his amylase and lipase are going down as well as his platelets increasing. No change in his current medications at this point and hopefully he will be able to be discharged in the next 1-2 days.  Antonieta Pert, MD 12/24/2016, 12:00 PM

## 2016-12-24 NOTE — Plan of Care (Signed)
Problem: Education: Goal: Utilization of techniques to improve thought processes will improve Outcome: Progressing Nurse discussed depression/coping skills with patient.    

## 2016-12-24 NOTE — Progress Notes (Signed)
Patient became more irritable and agitated during bedtime. Patient yelled out a loud sound which attracted the provider on call. The provider talked with the patient and ordered new med for the patient. (See MAR). Medication administered with good effect. Patient sleeping at this time. Will continue to monitor patient for safety and stability.

## 2016-12-25 LAB — GLUCOSE, CAPILLARY
GLUCOSE-CAPILLARY: 222 mg/dL — AB (ref 65–99)
Glucose-Capillary: 386 mg/dL — ABNORMAL HIGH (ref 65–99)
Glucose-Capillary: 276 mg/dL — ABNORMAL HIGH (ref 65–99)

## 2016-12-25 LAB — HEPATITIS PANEL, ACUTE
HEP A IGM: NEGATIVE
HEP B S AG: NEGATIVE
Hep B C IgM: NEGATIVE

## 2016-12-25 MED ORDER — INSULIN ASPART 100 UNIT/ML ~~LOC~~ SOLN: 2.0000 [IU] | SUBCUTANEOUS | 0 refills | Status: DC

## 2016-12-25 MED ORDER — GABAPENTIN 300 MG PO CAPS
300.0000 mg | ORAL_CAPSULE | Freq: Three times a day (TID) | ORAL | 0 refills | Status: DC
Start: 2016-12-25 — End: 2017-05-11

## 2016-12-25 MED ORDER — QUETIAPINE FUMARATE 200 MG PO TABS: 200.0000 mg | ORAL_TABLET | Freq: Every day | ORAL | 0 refills | Status: DC

## 2016-12-25 MED ORDER — SITAGLIPTIN PHOSPHATE 100 MG PO TABS: 100.0000 mg | ORAL_TABLET | Freq: Every day | ORAL | 0 refills | Status: DC

## 2016-12-25 MED ORDER — ATORVASTATIN CALCIUM 10 MG PO TABS
10.0000 mg | ORAL_TABLET | Freq: Every day | ORAL | 0 refills | Status: DC
Start: 2016-12-25 — End: 2016-12-25

## 2016-12-25 MED ORDER — TERBINAFINE HCL 1 % EX CREA: TOPICAL_CREAM | Freq: Two times a day (BID) | CUTANEOUS | 0 refills | Status: DC

## 2016-12-25 MED ORDER — GABAPENTIN 300 MG PO CAPS
ORAL_CAPSULE | ORAL | Status: DC
Start: 2016-12-25 — End: 2016-12-25
  Filled 2016-12-25: qty 1

## 2016-12-25 MED ORDER — TOPIRAMATE 50 MG PO TABS: 50.0000 mg | ORAL_TABLET | Freq: Two times a day (BID) | ORAL | 0 refills | Status: DC

## 2016-12-25 MED ORDER — GABAPENTIN 300 MG PO CAPS
300.0000 mg | ORAL_CAPSULE | Freq: Three times a day (TID) | ORAL | Status: DC
  Filled 2016-12-25 (×7): qty 1

## 2016-12-25 MED ORDER — HYDROXYZINE HCL 25 MG PO TABS: 25.0000 mg | ORAL_TABLET | Freq: Four times a day (QID) | ORAL | 0 refills | Status: DC | PRN

## 2016-12-25 MED ORDER — QUETIAPINE FUMARATE 200 MG PO TABS
200.0000 mg | ORAL_TABLET | Freq: Every day | ORAL | Status: DC
  Filled 2016-12-25 (×2): qty 1

## 2016-12-25 MED ORDER — ATORVASTATIN CALCIUM 10 MG PO TABS: 10.0000 mg | ORAL_TABLET | Freq: Every day | ORAL | 0 refills | Status: DC

## 2016-12-25 MED ORDER — NICOTINE 21 MG/24HR TD PT24: 21.0000 mg | MEDICATED_PATCH | Freq: Every day | TRANSDERMAL | 0 refills | Status: DC

## 2016-12-25 MED ORDER — BUPROPION HCL ER (SR) 150 MG PO TB12
150.0000 mg | ORAL_TABLET | Freq: Every day | ORAL | 0 refills | Status: DC
Start: 2016-12-26 — End: 2017-02-07

## 2016-12-25 NOTE — [facility] Group Notes (Signed)
The focus of this group is to educate the patient on the purpose and policies of crisis stabilization and provide a format to answer questions about their admission.  The group details unit policies and expectations of patients while admitted.  Patient did not attend group. 

## 2016-12-25 NOTE — Discharge Summary (Signed)
Physician Discharge Summary Note  Patient:  Chase Reed is an 51 y.o., male MRN:  [id number] DOB:  Sep 16, 1966 Patient phone:  [phone] (home)  Patient address:   [address] [place] [address],  Total Time spent with patient: Greater than 30 minutes  Date of Admission:  12/21/2016 Date of Discharge: 12-25-16  Reason for Admission: Opioid withdrawal symptoms.  Principal Problem: Polysubstance (including opioid drugs) dependence.  Discharge Diagnoses: Patient Active Problem List   Diagnosis Date Noted  . Opiate withdrawal (HCC) [F11.23] 12/21/2016  . Tinea cruris [B35.6] 12/21/2016  . Substance induced mood disorder (HCC) [F19.94] 12/21/2016  . 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 Elevated lipase [R74.8] 05/02/2016   Past Psychiatric History: Polysubstance dependence, Substance induced mood disorder.  Past Medical History:  Past Medical History:  Diagnosis Date  . Cirrhosis (HCC)   . Diabetes mellitus without complication (HCC)   . Hepatitis     Past Surgical History:  Procedure Laterality Date  . LITHOTRIPSY     Family History: History reviewed. No pertinent family history.  Family Psychiatric  History: See H&P  Social History:  History  Alcohol Use  . Yes    Comment: rarely      History  Drug Use  . Types: Heroin    Social History   Social History  . Marital status: Married    Spouse name: N/A  . Number of children: N/A  . Years of education: N/A   Social History Main Topics  . Smoking status: Current Every Day Smoker    Packs/day: 0.50    Years: 30.00  . Smokeless tobacco: Former Neurosurgeon  . Alcohol use Yes     Comment: rarely   . Drug use: Yes    Types: Heroin  . Sexual activity: No   Other Topics Concern  . None   Social History Narrative  . None   Hospital Course:  Patient is a 51 YO male with a long standing history of opiate dependence who was admitted for detox. The patient stated that he has a bused opiates for many years. He had been in a methadone program and remained sober x 12-13 years. He relapsed in July 2017 and has been using since that time. He stated that he has been using approximately $100 a day of IV heroin. He has wanted to get into a program, and decided that it was a good time now. He currently appears only to be having mild withdrawal symptoms. Biggest issue at this point is his poorly controlled diabetes mellitus 2.  Orvile was admitted to the [facility] adult unit with his UDS test results positive for opioid & Cocaine. He did admit to having been using IV heroin. He was also presenting with symptoms of depression, possibly substance induced. He was in need of opioid detox as well as mood stabilization treatments.   After admission assessment/ evaluation, Ottis's presenting symptoms were identified. The medication regimen targeting those symptoms were discussed & initiated. He received Clonidine detoxification treatment protocols to combat the withdrawal symptoms of opioid. He was also medicated & discharged on; Wellbutrin SR 150 mg for depression, Gabapentin 300 mg for agitation/substance withdrawal symptoms, Hydroxyzine 25 mg prn for anxiety, Nicotine 21 mg patch for smoking cessation, Seroquel 200 mg for mood control & Topamax 50 mg for mood stabilization. He was also enrolled & participated in the group counseling sessions being offered &  held on this unit. He learned coping skills that should help him further to cope better & manage his depression/substance abuse issues after discharge. Part of his treatment regimen & discharge plans is for a placement to a long term substance abuse treatment center for further substance abuse treatment. But, Feliz Beamravis chose to go to his friend's home after discharge. He was provided with information on [facility] in  the event that he changes his mind.  Feliz Beamravis has completed detox treatment & his mood is now stable. This is evidenced by his reports of improved mood, absence of suicidal ideations & or substance withdrawal symptoms. He is currently being discharged to continue further substance abuse treatment on outpatient basis as noted below. He is provided with all the pertinent information needed to make this appointment without problems.  Upon discharge, Feliz Beamravis adamantly denies any SIHI, AVH, delusional thoughts, paranoia or substance withdrawal symptoms. He is provided with a 7 days worth, supply [facility]. [facility] in no apparent distress. Transportation per friend .  Physical Findings: AIMS: Facial and Oral Movements Muscles of Facial Expression: None, normal Lips and Perioral Area: None, normal Jaw: None, normal Tongue: None, normal,Extremity Movements Upper (arms, wrists, hands, fingers): None, normal Lower (legs, knees, ankles, toes): None, normal, Trunk Movements Neck, shoulders, hips: None, normal, Overall Severity Severity of abnormal movements (highest score from questions above): None, normal Incapacitation due to abnormal movements: None, normal Patient's awareness of abnormal movements (rate only patient's report): No Awareness, Dental Status Current problems with teeth and/or dentures?: No Does patient usually wear dentures?: No  CIWA:  CIWA-Ar Total: 5 COWS:  COWS Total Score: 0  Musculoskeletal: Strength & Muscle Tone: within normal limits Gait & Station: normal Patient leans: N/A  Psychiatric Specialty Exam: Physical Exam  Constitutional: He is oriented to person, place, and time. He appears well-developed.  HENT:  Head: Normocephalic.  Eyes: Pupils are equal, round, and reactive to light.  Neck: Normal range of motion.  Cardiovascular: Normal rate.   Hx. High blood pressure  Respiratory: Effort normal.  GI:  Soft.  Genitourinary:  Genitourinary Comments: Deferred  Musculoskeletal: Normal range of motion.  Neurological: He is alert and oriented to person, place, and time.  Skin: Skin is warm and dry.    Review of Systems  Constitutional: Negative.   HENT: Negative.   Eyes: Negative.   Respiratory: Negative.   Cardiovascular: Negative.   Gastrointestinal: Negative.   Genitourinary: Negative.   Musculoskeletal: Negative.   Skin: Negative.   Neurological: Negative.   Endo/Heme/Allergies: Negative.   Psychiatric/Behavioral: Positive for depression (Stable) and substance abuse (Hx. Opioid & Cocaine dependence). Negative for hallucinations, memory loss and suicidal ideas. The patient has insomnia (Stable). The patient is not nervous/anxious.     Blood pressure (!) 136/100, pulse 94, temperature 98.1 F (36.7 C), temperature source Oral, resp. rate 16, height 5\' 11"  (1.803 m), weight 88.9 kg (196 lb), SpO2 99 %.Body mass index is 27.34 kg/m.  See Md's SRA   Have you used any form of tobacco in the last 30 days? (Cigarettes, Smokeless Tobacco, Cigars, and/or Pipes): Yes  Has this patient used any form of tobacco in the last 30 days? (Cigarettes, Smokeless Tobacco, Cigars, and/or Pipes): Yes, provided with Nicotine patch prescription upon discharge.  Blood Alcohol level:  Lab Results  Component Value Date   [facility] 12/20[facility]   ETH <5 05/01/2016   Metabolic Disorder Labs:  Lab Results  Component Value Date   HGBA1C 8.0 (H) 12/22/2016   MPG 183 12/22/2016   No results found for: PROLACTIN No results found for: CHOL, TRIG, HDL, CHOLHDL, VLDL, LDLCALC  See Psychiatric Specialty Exam and Suicide Risk Assessment completed by Attending Physician prior to discharge.  Discharge destination:  Home  Is patient on multiple antipsychotic therapies at discharge:  No   Has Patient had three or more failed trials of antipsychotic monotherapy by history:  No  Recommended Plan for Multiple  Antipsychotic Therapies: NA  Allergies as of 12/25/2016      Reactions   Suboxone [buprenorphine Hcl-naloxone Hcl] Nausea And Vomiting   Hyper- withdrawals      Medication List    STOP taking these medications   dicyclomine 20 MG tablet Commonly known as:  BENTYL   doxepin 10 MG capsule Commonly known as:  SINEQUAN   insulin glargine 100 UNIT/ML injection Commonly known as:  LANTUS   metFORMIN 1000 MG tablet Commonly known as:  GLUCOPHAGE   methadone 10 MG/ML solution Commonly known as:  DOLOPHINE   promethazine 25 MG tablet Commonly known as:  PHENERGAN     TAKE these medications     Indication  atorvastatin 10 MG tablet Commonly known as:  LIPITOR Take 1 tablet (10 mg total) by mouth daily. For high cholesterol What changed:  additional instructions  Indication:  Elevation of Both Cholesterol and Triglycerides in Blood   buPROPion 150 MG 12 hr tablet Commonly known as:  WELLBUTRIN SR Take 1 tablet (150 mg total) by mouth daily. For depression Start taking on:  12/26/2016  Indication:  Major Depressive Disorder   gabapentin 300 MG capsule Commonly known as:  NEURONTIN Take 1 capsule (300 mg total) by mouth 3 (three) times daily. For agitation What changed:  when to take this  additional instructions  Indication:  Agitation   hydrOXYzine 25 MG tablet Commonly known as:  ATARAX/VISTARIL Take 1 tablet (25 mg total) by mouth every 6 (six) hours as needed for anxiety.  Indication:  Anxiety Neurosis   insulin aspart 100 UNIT/ML injection Commonly known as:  novoLOG Inject 2-8 Units into the skin See admin instructions. Sliding scale.over 250--2 units over 300--4 units over 350--6 units over 400--8 units Up to 50 units daily: For diabetes management What changed:  additional instructions  Indication:  Type 2 Diabetes, Per outpatient provider only   nicotine 21 mg/24hr patch Commonly known as:  NICODERM CQ - dosed in mg/24 hours Place 1 patch (21 mg total)  onto the skin daily. For smoking cessation Start taking on:  12/26/2016  Indication:  Nicotine Addiction   QUEtiapine 200 MG tablet Commonly known as:  SEROQUEL Take 1 tablet (200 mg total) by mouth at bedtime. For mood control  Indication:  Mood control   sitaGLIPtin 100 MG tablet Commonly known as:  JANUVIA Take 1 tablet (100 mg total) by mouth daily. For diabetes management What changed:  additional instructions  Indication:  Type 2 Diabetes   terbinafine 1 % cream Commonly known as:  LAMISIL Apply topically 2 (two) times daily. For fungal infection  Indication:  Ringworm of the Body   topiramate 50 MG tablet Commonly known as:  TOPAMAX Take 1 tablet (50 mg total) by mouth 2 (two) times daily. For mood stabilization  Indication:  Mood control      Follow-up Information    [facility]. Go to.   Specialty:  Behavioral Health Why:  Please go for a walk-in appointment within 1-3 days  of discharge to be established for outpatient services. Walk-in hours are Mon-Fri 8-3:30p. Please arrive as early as possible to be sure you are seen. Contact information: [address] [place] [address] [phone]        [facility] Follow up.   Why:  Please this is a WALK-IN APPOINTMENT: Go to to this FREE medical clinic on any week days Monday thru Friday for the treatment of your diabetes & high blood pressure Contact information: [address] [place] [address] [phone]         Follow-up recommendations: Activity:  As tolerated Diet: As recommended by your primary care doctor. Keep all scheduled follow-up appointments as recommended.   Comments: Patient is instructed prior to discharge to: Take all medications as prescribed by his/her mental healthcare provider. Report any adverse effects and or reactions from the medicines to his/her outpatient provider promptly. Patient has been instructed & cautioned: To not engage  in alcohol and or illegal drug use while on prescription medicines. In the event of worsening symptoms, patient is instructed to call the crisis hotline, 911 and or go to the nearest ED for appropriate evaluation and treatment of symptoms. To follow-up with his/her primary care provider for your other medical issues, concerns and or health care needs.   Signed: Sanjuana Kava, NP, PMHNP, FNP-BC 12/25/2016, 11:33 AM

## 2016-12-25 NOTE — Progress Notes (Signed)
D: Pt at the time of assessment was alert and oriented x 4. Pt at this time endorses moderate depression, anxiety and insomnia; states, "I have not been sleeping, I get very anxious at night because I feel I may not be able to sleep; I will be going home tomorrow; I feel a little anxious about that too." Pt however denied pain, SI/HI or AVH. Pt remained flat and some somewhat agitated. A: Medications offered as prescribed.  Support, encouragement, and safe environment provided.  15-minute safety checks continue. R: Pt was med compliant.  Pt attended AA group. Safety checks continue.

## 2016-12-25 NOTE — [facility] Suicide Risk Assessment (Signed)
[facility]   Principal Problem: Opiate withdrawal [facility](HCC) Discharge Diagnoses:  Patient Active Problem List   Diagnosis Date Noted  . Opiate withdrawal (HCC) [F11.23] 12/21/2016  . Tinea cruris [B35.6] 12/21/2016  . Substance induced mood disorder (HCC) [F19.94] 12/21/2016  . 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 Elevated lipase [R74.8] 05/02/2016    Total Time spent with patient: 20 minutes  Musculoskeletal: Strength & Muscle Tone: within normal limits Gait & Station: normal Patient leans: N/A  Psychiatric Specialty Exam: ROS  Blood pressure (!) 136/100, pulse 94, temperature 98.1 F (36.7 C), temperature source Oral, resp. rate 16, height 5\' 11"  (1.803 m), weight 88.9 kg (196 lb), SpO2 99 %.Body mass index is 27.34 kg/m.  General Appearance: Casual  Eye Contact::  Good  Speech:  Clear and Coherent409  Volume:  Normal  Mood:  Euthymic  Affect:  Appropriate  Thought Process:  Coherent  Orientation:  Full (Time, Place, and Person)  Thought Content:  Logical  Suicidal Thoughts:  No  Homicidal Thoughts:  No  Memory:  Immediate;   Good  Judgement:  Intact  Insight:  Lacking  Psychomotor Activity:  Normal  Concentration:  Fair  Recall:  Fair  Fund of Knowledge:Good  Language: Good  Akathisia:  No  Handed:  Right  AIMS (if indicated):     Assets:  Communication Skills Desire for Improvement  Sleep:  Number of Hours: 4.75  Cognition: WNL  ADL's:  Intact   Mental Status Per Nursing Assessment::   On Admission:     Demographic Factors:  Male, Low socioeconomic status, Living alone and Unemployed  Loss Factors: Decline in physical health and Financial problems/change in socioeconomic status  Historical Factors: Impulsivity  Risk Reduction Factors:   Responsible for children under 18 years of  age and Sense of responsibility to family  Continued Clinical Symptoms:  Depression:   Impulsivity Alcohol/Substance Abuse/Dependencies  Cognitive Features That Contribute To Risk:  None    Suicide Risk:  Minimal: No identifiable suicidal ideation.  Patients presenting with no risk factors but with morbid ruminations; may be classified as minimal risk based on the severity of the depressive symptoms  [facility] Information    [facility]   Specialty:  Behavioral Health Why:  Please go for a walk-in appointment within 1-3 days of discharge to be established for outpatient services. Walk-in hours are Mon-Fri 8-3:30p. Please arrive as early as possible to be sure you are seen. Contact information: [address] [place] [address] [phone]           Plan Of Care/Follow-up recommendations:  Activity:  ad lib  Antonieta PertGreg Lawson Page Lancon, MD 12/25/2016, 10:10 AM

## 2016-12-25 NOTE — Progress Notes (Signed)
Patient ID: Chase Reed, male   DOB: [birth year], 51 y.o.   MRN: [id number] Patient was discharged to home/self care in the presence of his wife.   Patient acknowledged understanding of discharge instructions and follow up care.  Patient also acknowledged the receipt of his personal belongings upon discharge.  Patient was in a bright mood and stated that he was ready to maintain his care in a community setting.  Patient was able to report a reduction in symptoms and a desire for sobriety upon discharge.

## 2017-01-31 ENCOUNTER — Encounter (HOSPITAL_COMMUNITY): Payer: Self-pay | Admitting: Emergency Medicine

## 2017-01-31 ENCOUNTER — Inpatient Hospital Stay (HOSPITAL_COMMUNITY)
Admission: RE | Admit: 2017-01-31 | Discharge: 2017-02-07 | DRG: 885 | Disposition: A | Discharge status: Home / Self Care | Payer: MEDICAID | Attending: Psychiatry | Admitting: Psychiatry

## 2017-01-31 ENCOUNTER — Emergency Department (HOSPITAL_COMMUNITY)
Admission: EM | Admit: 2017-01-31 | Discharge: 2017-01-31 | Disposition: A | Discharge status: Other Acute Inpatient Hospital | Payer: MEDICAID | Attending: Emergency Medicine | Admitting: Emergency Medicine

## 2017-01-31 ENCOUNTER — Encounter (HOSPITAL_COMMUNITY): Payer: Self-pay

## 2017-01-31 DIAGNOSIS — F332 Major depressive disorder, recurrent severe without psychotic features: Secondary | ICD-10-CM | POA: Diagnosis not present

## 2017-01-31 DIAGNOSIS — F1123 Opioid dependence with withdrawal: Secondary | ICD-10-CM | POA: Diagnosis present

## 2017-01-31 DIAGNOSIS — R451 Restlessness and agitation: Secondary | ICD-10-CM | POA: Diagnosis present

## 2017-01-31 DIAGNOSIS — F1920 Other psychoactive substance dependence, uncomplicated: Secondary | ICD-10-CM | POA: Diagnosis present

## 2017-01-31 DIAGNOSIS — F1124 Opioid dependence with opioid-induced mood disorder: Secondary | ICD-10-CM | POA: Diagnosis present

## 2017-01-31 DIAGNOSIS — F3130 Bipolar disorder, current episode depressed, mild or moderate severity, unspecified: Secondary | ICD-10-CM | POA: Diagnosis not present

## 2017-01-31 DIAGNOSIS — E7800 Pure hypercholesterolemia, unspecified: Secondary | ICD-10-CM | POA: Diagnosis present

## 2017-01-31 DIAGNOSIS — D649 Anemia, unspecified: Secondary | ICD-10-CM | POA: Insufficient documentation

## 2017-01-31 DIAGNOSIS — F1110 Opioid abuse, uncomplicated: Secondary | ICD-10-CM | POA: Insufficient documentation

## 2017-01-31 DIAGNOSIS — Z590 Homelessness: Secondary | ICD-10-CM

## 2017-01-31 DIAGNOSIS — F339 Major depressive disorder, recurrent, unspecified: Secondary | ICD-10-CM

## 2017-01-31 DIAGNOSIS — R110 Nausea: Secondary | ICD-10-CM | POA: Diagnosis present

## 2017-01-31 DIAGNOSIS — Z635 Disruption of family by separation and divorce: Secondary | ICD-10-CM

## 2017-01-31 DIAGNOSIS — D696 Thrombocytopenia, unspecified: Secondary | ICD-10-CM | POA: Diagnosis not present

## 2017-01-31 DIAGNOSIS — Z794 Long term (current) use of insulin: Secondary | ICD-10-CM | POA: Insufficient documentation

## 2017-01-31 DIAGNOSIS — E119 Type 2 diabetes mellitus without complications: Secondary | ICD-10-CM | POA: Diagnosis not present

## 2017-01-31 DIAGNOSIS — Z638 Other specified problems related to primary support group: Secondary | ICD-10-CM

## 2017-01-31 DIAGNOSIS — G629 Polyneuropathy, unspecified: Secondary | ICD-10-CM | POA: Diagnosis not present

## 2017-01-31 DIAGNOSIS — E1142 Type 2 diabetes mellitus with diabetic polyneuropathy: Secondary | ICD-10-CM | POA: Diagnosis present

## 2017-01-31 DIAGNOSIS — F411 Generalized anxiety disorder: Secondary | ICD-10-CM | POA: Diagnosis present

## 2017-01-31 DIAGNOSIS — E1165 Type 2 diabetes mellitus with hyperglycemia: Secondary | ICD-10-CM | POA: Diagnosis present

## 2017-01-31 DIAGNOSIS — K7460 Unspecified cirrhosis of liver: Secondary | ICD-10-CM | POA: Diagnosis present

## 2017-01-31 DIAGNOSIS — F1910 Other psychoactive substance abuse, uncomplicated: Secondary | ICD-10-CM

## 2017-01-31 DIAGNOSIS — F314 Bipolar disorder, current episode depressed, severe, without psychotic features: Secondary | ICD-10-CM | POA: Diagnosis present

## 2017-01-31 DIAGNOSIS — Z008 Encounter for other general examination: Secondary | ICD-10-CM

## 2017-01-31 DIAGNOSIS — G43909 Migraine, unspecified, not intractable, without status migrainosus: Secondary | ICD-10-CM | POA: Diagnosis present

## 2017-01-31 DIAGNOSIS — Z560 Unemployment, unspecified: Secondary | ICD-10-CM

## 2017-01-31 DIAGNOSIS — Z79899 Other long term (current) drug therapy: Secondary | ICD-10-CM

## 2017-01-31 DIAGNOSIS — Z888 Allergy status to other drugs, medicaments and biological substances status: Secondary | ICD-10-CM

## 2017-01-31 DIAGNOSIS — I10 Essential (primary) hypertension: Secondary | ICD-10-CM | POA: Diagnosis present

## 2017-01-31 DIAGNOSIS — F17210 Nicotine dependence, cigarettes, uncomplicated: Secondary | ICD-10-CM | POA: Diagnosis present

## 2017-01-31 DIAGNOSIS — G4700 Insomnia, unspecified: Secondary | ICD-10-CM | POA: Diagnosis present

## 2017-01-31 DIAGNOSIS — F1120 Opioid dependence, uncomplicated: Secondary | ICD-10-CM | POA: Diagnosis present

## 2017-01-31 DIAGNOSIS — R45851 Suicidal ideations: Secondary | ICD-10-CM | POA: Diagnosis present

## 2017-01-31 DIAGNOSIS — F17200 Nicotine dependence, unspecified, uncomplicated: Secondary | ICD-10-CM | POA: Diagnosis not present

## 2017-01-31 DIAGNOSIS — I7090 Unspecified atherosclerosis: Secondary | ICD-10-CM | POA: Diagnosis present

## 2017-01-31 DIAGNOSIS — F1193 Opioid use, unspecified with withdrawal: Secondary | ICD-10-CM

## 2017-01-31 DIAGNOSIS — Z720 Tobacco use: Secondary | ICD-10-CM

## 2017-01-31 LAB — RAPID URINE DRUG SCREEN, HOSP PERFORMED
AMPHETAMINES: NOT DETECTED
BARBITURATES: NOT DETECTED
BENZODIAZEPINES: NOT DETECTED
Cocaine: NOT DETECTED
Opiates: POSITIVE — AB
Tetrahydrocannabinol: NOT DETECTED

## 2017-01-31 LAB — CBC WITH DIFFERENTIAL/PLATELET
Basophils Absolute: 0 10*3/uL (ref 0.0–0.1)
Basophils Relative: 0 %
Eosinophils Absolute: 0 10*3/uL (ref 0.0–0.7)
Eosinophils Relative: 2 %
HEMATOCRIT: 36.8 % — AB (ref 39.0–52.0)
HEMOGLOBIN: 12.8 g/dL — AB (ref 13.0–17.0)
LYMPHS PCT: 29 %
Lymphs Abs: 0.7 10*3/uL (ref 0.7–4.0)
MCH: 29.7 pg (ref 26.0–34.0)
MCHC: 34.8 g/dL (ref 30.0–36.0)
MCV: 85.4 fL (ref 78.0–100.0)
MONOS PCT: 10 %
Monocytes Absolute: 0.2 10*3/uL (ref 0.1–1.0)
NEUTROS ABS: 1.4 10*3/uL — AB (ref 1.7–7.7)
Neutrophils Relative %: 59 %
Platelets: 46 10*3/uL — ABNORMAL LOW (ref 150–400)
RBC: 4.31 MIL/uL (ref 4.22–5.81)
RDW: 13.5 % (ref 11.5–15.5)
WBC: 2.3 10*3/uL — AB (ref 4.0–10.5)

## 2017-01-31 LAB — GLUCOSE, CAPILLARY
GLUCOSE-CAPILLARY: 225 mg/dL — AB (ref 65–99)
GLUCOSE-CAPILLARY: 182 mg/dL — AB (ref 65–99)

## 2017-01-31 LAB — ACETAMINOPHEN LEVEL

## 2017-01-31 LAB — SALICYLATE LEVEL

## 2017-01-31 LAB — COMPREHENSIVE METABOLIC PANEL
ALK PHOS: 108 U/L (ref 38–126)
ALT: 22 U/L (ref 17–63)
ANION GAP: 8 (ref 5–15)
AST: 28 U/L (ref 15–41)
Albumin: 4.1 g/dL (ref 3.5–5.0)
BILIRUBIN TOTAL: 0.7 mg/dL (ref 0.3–1.2)
BUN: 13 mg/dL (ref 6–20)
CALCIUM: 9.4 mg/dL (ref 8.9–10.3)
CO2: 25 mmol/L (ref 22–32)
CREATININE: 0.82 mg/dL (ref 0.61–1.24)
Chloride: 100 mmol/L — ABNORMAL LOW (ref 101–111)
GFR calc non Af Amer: >60 mL/min (ref >60)
Glucose, Bld: 311 mg/dL — ABNORMAL HIGH (ref 65–99)
Potassium: 4.3 mmol/L (ref 3.5–5.1)
Sodium: 133 mmol/L — ABNORMAL LOW (ref 135–145)
TOTAL PROTEIN: 7.9 g/dL (ref 6.5–8.1)

## 2017-01-31 LAB — ETHANOL: Alcohol, Ethyl (B): <5 mg/dL (ref <5)

## 2017-01-31 LAB — CBG MONITORING, ED: GLUCOSE-CAPILLARY: 282 mg/dL — AB (ref 65–99)

## 2017-01-31 MED ORDER — HYDROXYZINE HCL 25 MG PO TABS: 25.0000 mg | ORAL_TABLET | Freq: Four times a day (QID) | ORAL | Status: DC | PRN

## 2017-01-31 MED ORDER — METHOCARBAMOL 500 MG PO TABS
500.0000 mg | ORAL_TABLET | Freq: Three times a day (TID) | ORAL | Status: AC | PRN
  Administered 2017-01-31 – 2017-02-02 (×5): 500 mg via ORAL
  Filled 2017-01-31 (×5): qty 1

## 2017-01-31 MED ORDER — TRAZODONE HCL 50 MG PO TABS
50.0000 mg | ORAL_TABLET | Freq: Every evening | ORAL | Status: DC | PRN
  Administered 2017-01-31 – 2017-02-02 (×3): 50 mg via ORAL
  Filled 2017-01-31 (×3): qty 1

## 2017-01-31 MED ORDER — LINAGLIPTIN 5 MG PO TABS
5.0000 mg | ORAL_TABLET | Freq: Every day | ORAL | Status: DC
  Administered 2017-01-31: 5 mg via ORAL
  Filled 2017-01-31: qty 1

## 2017-01-31 MED ORDER — QUETIAPINE FUMARATE 200 MG PO TABS
200.0000 mg | ORAL_TABLET | Freq: Every day | ORAL | Status: DC
  Administered 2017-01-31 – 2017-02-03 (×4): 200 mg via ORAL
  Filled 2017-01-31 (×9): qty 1

## 2017-01-31 MED ORDER — DICYCLOMINE HCL 10 MG PO CAPS
20.0000 mg | ORAL_CAPSULE | Freq: Two times a day (BID) | ORAL | Status: DC | PRN
  Filled 2017-01-31: qty 2

## 2017-01-31 MED ORDER — ATORVASTATIN CALCIUM 10 MG PO TABS
10.0000 mg | ORAL_TABLET | Freq: Every day | ORAL | Status: DC
Start: 2017-01-31 — End: 2017-01-31
  Administered 2017-01-31: 10 mg via ORAL
  Filled 2017-01-31: qty 1

## 2017-01-31 MED ORDER — ACETAMINOPHEN 325 MG PO TABS: 650.0000 mg | ORAL_TABLET | ORAL | Status: DC | PRN

## 2017-01-31 MED ORDER — LOPERAMIDE HCL 2 MG PO CAPS: 2.0000 mg | ORAL_CAPSULE | ORAL | Status: AC | PRN

## 2017-01-31 MED ORDER — LINAGLIPTIN 5 MG PO TABS
5.0000 mg | ORAL_TABLET | Freq: Every day | ORAL | Status: DC
  Administered 2017-02-01 – 2017-02-07 (×6): 5 mg via ORAL
  Filled 2017-01-31 (×10): qty 1

## 2017-01-31 MED ORDER — IBUPROFEN 200 MG PO TABS: 600.0000 mg | ORAL_TABLET | Freq: Three times a day (TID) | ORAL | Status: DC | PRN

## 2017-01-31 MED ORDER — ZOLPIDEM TARTRATE 5 MG PO TABS: 5.0000 mg | ORAL_TABLET | Freq: Every evening | ORAL | Status: DC | PRN

## 2017-01-31 MED ORDER — ONDANSETRON 8 MG PO TBDP
8.0000 mg | ORAL_TABLET | Freq: Once | ORAL | Status: AC
  Administered 2017-01-31: 8 mg via ORAL
  Filled 2017-01-31: qty 1

## 2017-01-31 MED ORDER — CLONIDINE HCL 0.1 MG PO TABS
0.1000 mg | ORAL_TABLET | Freq: Four times a day (QID) | ORAL | Status: AC
  Administered 2017-01-31 – 2017-02-02 (×6): 0.1 mg via ORAL
  Filled 2017-01-31 (×11): qty 1

## 2017-01-31 MED ORDER — INSULIN ASPART 100 UNIT/ML ~~LOC~~ SOLN
2.0000 [IU] | SUBCUTANEOUS | Status: DC
  Administered 2017-01-31: 2 [IU] via SUBCUTANEOUS
  Filled 2017-01-31: qty 1

## 2017-01-31 MED ORDER — DICYCLOMINE HCL 20 MG PO TABS
20.0000 mg | ORAL_TABLET | Freq: Four times a day (QID) | ORAL | Status: AC | PRN
  Administered 2017-01-31 – 2017-02-02 (×3): 20 mg via ORAL
  Filled 2017-01-31 (×3): qty 1

## 2017-01-31 MED ORDER — NICOTINE 21 MG/24HR TD PT24
21.0000 mg | MEDICATED_PATCH | Freq: Every day | TRANSDERMAL | Status: DC
  Administered 2017-02-01 – 2017-02-06 (×6): 21 mg via TRANSDERMAL
  Filled 2017-01-31 (×11): qty 1

## 2017-01-31 MED ORDER — BUPROPION HCL ER (SR) 150 MG PO TB12
150.0000 mg | ORAL_TABLET | Freq: Every day | ORAL | Status: DC
  Administered 2017-01-31: 150 mg via ORAL
  Filled 2017-01-31: qty 1

## 2017-01-31 MED ORDER — CLONIDINE HCL 0.1 MG PO TABS
0.1000 mg | ORAL_TABLET | Freq: Every day | ORAL | Status: AC
  Administered 2017-02-05: 0.1 mg via ORAL
  Filled 2017-01-31: qty 2
  Filled 2017-01-31 (×3): qty 1

## 2017-01-31 MED ORDER — GABAPENTIN 300 MG PO CAPS
300.0000 mg | ORAL_CAPSULE | Freq: Three times a day (TID) | ORAL | Status: DC
Start: 2017-01-31 — End: 2017-01-31

## 2017-01-31 MED ORDER — BUPROPION HCL ER (XL) 150 MG PO TB24
150.0000 mg | ORAL_TABLET | Freq: Every day | ORAL | Status: DC
  Administered 2017-02-01 – 2017-02-04 (×4): 150 mg via ORAL
  Filled 2017-01-31 (×8): qty 1

## 2017-01-31 MED ORDER — QUETIAPINE FUMARATE 100 MG PO TABS: 200.0000 mg | ORAL_TABLET | Freq: Every day | ORAL | Status: DC

## 2017-01-31 MED ORDER — TOPIRAMATE 25 MG PO TABS
50.0000 mg | ORAL_TABLET | Freq: Two times a day (BID) | ORAL | Status: DC
  Administered 2017-01-31: 50 mg via ORAL
  Filled 2017-01-31: qty 2

## 2017-01-31 MED ORDER — ATORVASTATIN CALCIUM 10 MG PO TABS
10.0000 mg | ORAL_TABLET | Freq: Every day | ORAL | Status: DC
Start: 2017-01-31 — End: 2017-02-07
  Administered 2017-02-01 – 2017-02-06 (×6): 10 mg via ORAL
  Filled 2017-01-31 (×10): qty 1

## 2017-01-31 MED ORDER — GABAPENTIN 300 MG PO CAPS
300.0000 mg | ORAL_CAPSULE | Freq: Three times a day (TID) | ORAL | Status: DC
  Administered 2017-01-31 – 2017-02-05 (×15): 300 mg via ORAL
  Filled 2017-01-31 (×28): qty 1

## 2017-01-31 MED ORDER — HYDROXYZINE HCL 25 MG PO TABS
25.0000 mg | ORAL_TABLET | Freq: Four times a day (QID) | ORAL | Status: AC | PRN
  Administered 2017-01-31 – 2017-02-05 (×7): 25 mg via ORAL
  Filled 2017-01-31 (×7): qty 1

## 2017-01-31 MED ORDER — TOPIRAMATE 25 MG PO TABS
50.0000 mg | ORAL_TABLET | Freq: Two times a day (BID) | ORAL | Status: DC
  Administered 2017-01-31 – 2017-02-07 (×13): 50 mg via ORAL
  Filled 2017-01-31 (×13): qty 2
  Filled 2017-01-31: qty 1
  Filled 2017-01-31 (×6): qty 2

## 2017-01-31 MED ORDER — ONDANSETRON HCL 4 MG PO TABS
4.0000 mg | ORAL_TABLET | Freq: Three times a day (TID) | ORAL | Status: DC | PRN
Start: 2017-01-31 — End: 2017-01-31

## 2017-01-31 MED ORDER — ALUM & MAG HYDROXIDE-SIMETH 200-200-20 MG/5ML PO SUSP: 30.0000 mL | ORAL | Status: DC | PRN

## 2017-01-31 MED ORDER — INSULIN ASPART 100 UNIT/ML ~~LOC~~ SOLN
2.0000 [IU] | Freq: Three times a day (TID) | SUBCUTANEOUS | Status: DC
  Administered 2017-02-01 – 2017-02-02 (×2): 6 [IU] via SUBCUTANEOUS

## 2017-01-31 MED ORDER — NAPROXEN 500 MG PO TABS
500.0000 mg | ORAL_TABLET | Freq: Two times a day (BID) | ORAL | Status: AC | PRN
  Administered 2017-01-31 – 2017-02-04 (×2): 500 mg via ORAL
  Filled 2017-01-31 (×3): qty 1

## 2017-01-31 MED ORDER — MAGNESIUM HYDROXIDE 400 MG/5ML PO SUSP
30.0000 mL | Freq: Every day | ORAL | Status: DC | PRN
  Administered 2017-02-03 – 2017-02-04 (×2): 30 mL via ORAL
  Filled 2017-01-31 (×2): qty 30

## 2017-01-31 MED ORDER — ACETAMINOPHEN 325 MG PO TABS: 650.0000 mg | ORAL_TABLET | Freq: Four times a day (QID) | ORAL | Status: DC | PRN

## 2017-01-31 MED ORDER — CLONIDINE HCL 0.1 MG PO TABS
0.1000 mg | ORAL_TABLET | ORAL | Status: AC
  Administered 2017-02-02 – 2017-02-04 (×4): 0.1 mg via ORAL
  Filled 2017-01-31 (×5): qty 1

## 2017-01-31 MED ORDER — LORAZEPAM 1 MG PO TABS: 1.0000 mg | ORAL_TABLET | Freq: Three times a day (TID) | ORAL | Status: DC | PRN

## 2017-01-31 MED ORDER — NICOTINE 21 MG/24HR TD PT24
21.0000 mg | MEDICATED_PATCH | Freq: Every day | TRANSDERMAL | Status: DC
  Administered 2017-01-31: 21 mg via TRANSDERMAL
  Filled 2017-01-31: qty 1

## 2017-01-31 MED ORDER — ONDANSETRON 4 MG PO TBDP
4.0000 mg | ORAL_TABLET | Freq: Four times a day (QID) | ORAL | Status: AC | PRN
  Administered 2017-01-31 – 2017-02-02 (×4): 4 mg via ORAL
  Filled 2017-01-31 (×4): qty 1

## 2017-01-31 MED ORDER — INSULIN ASPART 100 UNIT/ML ~~LOC~~ SOLN: 0.0000 [IU] | Freq: Three times a day (TID) | SUBCUTANEOUS | Status: DC

## 2017-01-31 MED ORDER — ENSURE ENLIVE PO LIQD: 237.0000 mL | Freq: Two times a day (BID) | ORAL | Status: DC

## 2017-01-31 NOTE — ED Provider Notes (Signed)
[facility]-EMERGENCY DEPT Provider Note   CSN: [id number] Arrival date & time: 01/31/17  1200     History   Chief Complaint Chief Complaint  Patient presents with  . Medical Clearance    HPI Chase Reed is a 51 y.o. male with a PMHx of depression, substance abuse, cirrhosis, DM2, and hepatitis, who presents to the ED from [facility] for medical clearance and then has a bed at behavioral health for inpatient treatment. Their notes are listed below, and summarized as follows: pt is requesting psychiatric admission for relapse on heroin and severe depression, reporting he relapsed on drugs after having been clean and in a methadone clinic until ~8 months ago and hasn't been able to get into a detox facility; last use of IV fentanyl and heroin was at 10p last night, and he is now having withdrawal symptoms of chills, generalized abd cramps, tremors, body aches, nausea, and diarrhea (1 episode of nonbloody looser than normal stool, not watery). States these symptoms are currently mild, and similar to prior withdrawal symptoms; no tx tried, no known alleviating factors, aggravated by not using drugs. He is having suicidal ideations with a plan to run into traffic to harm himself; doesn't feel safe going home. Denies HI/AVH, EtOH use, or other illicit drug use. +Smoker. He confirms his medications as listed in the [facility], states he last took most of them ~2 days ago, except for his novolog which he is compliant with (sliding scale). Additionally he reports he's also on Bentyl, but can't recall the dosage. He denies fevers, CP, SOB, vomiting, constipation, obstipation, melena, hematochezia, hematuria, dysuria, arthralgias, numbness, tingling, focal weakness, or any other complaints at this time. Here voluntarily; has a bed at [facility] after he's medically cleared.   PER [facility] NOTES: Chase Reed is an 51 y.o. male who came to [facility] as a walk in seeking inpatient admission for relapse on heroin and severe depressive  symptoms. Pt states that he was on a methadone program for 12 years until a relapse 9 months ago. Pt states that he started using heroin and fentanyl intervenously daily 4 days after he discharged from [facility] last month. He states that he was going to follow up with an [facility] but that "fell through" because he was living in [place] without transportation. Pt states that he wants to get clean so that he can be a part of his wife and daughters life who currently are estranged from him and living with his sister. Pt was tearful and states that he we to [facility] in [place] to detox but was turned away due to his insurance not being accepted. He also went to [facility] and they said they couldn't detox him because they didn't see anything in his drug screen. He states he doesn't know how that is possible unless the heroin he has been using is actually "synthetic opiates". He states that they have been calling it "grey ghost" and he feels it may not show up on all drug panels. Pt states that he last used at 10p last night and is starting to have cramps, tremors, chills, body aches and loose stool. He states that he feels if he were to leave here he would "run into traffic" and kill himself because he can't live like this anymore. Pt was tearful and states that he would not be safe if he left. He denies HI or AVH and no history of this. No other drug use reported at this time. Pt will  be sent to [facility] ED for medical clearance and will be admitted to [facility] when medically clear per [facility]. [name] NP did a medical screening exam and states that he meets inpatient criteria.    The history is provided by the patient and medical records. No language interpreter was used.  Mental Health Problem  Presenting symptoms: depression and suicidal thoughts   Presenting symptoms: no hallucinations and no homicidal ideas   Onset quality:  Gradual Duration:  8 months Timing:  Constant Progression:   Worsening Chronicity:  Recurrent Context: drug abuse and noncompliance   Treatment compliance:  Some of the time Time since last psychoactive medication taken:  2 days Relieved by:  None tried Worsened by:  Drugs Ineffective treatments:  None tried Associated symptoms: abdominal pain (cramps)   Associated symptoms: no chest pain   Risk factors: hx of mental illness and recent psychiatric admission     Past Medical History:  Diagnosis Date  . Cirrhosis (HCC)   . Diabetes mellitus without complication (HCC)   . Hepatitis     Patient Active Problem List   Diagnosis Date Noted  . Opiate withdrawal (HCC) 12/21/2016  . Tinea cruris 12/21/2016  . Substance induced mood disorder (HCC) 12/21/2016  . Polysubstance (including opioids) dependence w/o physiol dependence (HCC) 05/02/2016  . 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 Elevated lipase 05/02/2016    Past Surgical History:  Procedure Laterality Date  . LITHOTRIPSY         Home Medications    Prior to Admission medications   Medication Sig Start Date End Date Taking? Authorizing Provider  atorvastatin (LIPITOR) 10 MG tablet Take 1 tablet (10 mg total) by mouth daily. For high cholesterol 12/25/16   Sanjuana Kava, NP  buPROPion [facility] SR) 150 MG 12 hr tablet Take 1 tablet (150 mg total) by mouth daily. For depression 12/26/16   Sanjuana Kava, NP  gabapentin (NEURONTIN) 300 MG capsule Take 1 capsule (300 mg total) by mouth 3 (three) times daily. For agitation 12/25/16   Sanjuana Kava, NP  hydrOXYzine (ATARAX/VISTARIL) 25 MG tablet Take 1 tablet (25 mg total) by mouth every 6 (six) hours as needed for anxiety. 12/25/16   Sanjuana Kava, NP  insulin aspart (NOVOLOG) 100 UNIT/ML injection Inject 2-8 Units into the skin See admin instructions. Sliding scale.over 250--2 units over 300--4 units over 350--6 units over 400--8 units Up to 50 units daily: For diabetes management 12/25/16   Sanjuana Kava, NP  nicotine (NICODERM CQ - DOSED IN MG/24 HOURS) 21 mg/24hr patch Place 1 patch (21 mg total) onto the skin daily. For smoking cessation 12/26/16   Sanjuana Kava, NP  QUEtiapine (SEROQUEL) 200 MG tablet Take 1 tablet (200 mg total) by mouth at bedtime. For mood control 12/25/16   Sanjuana Kava, NP  sitaGLIPtin (JANUVIA) 100 MG tablet Take 1 tablet (100 mg total) by mouth daily. For diabetes management 12/25/16   Sanjuana Kava, NP  terbinafine (LAMISIL) 1 % cream Apply topically 2 (two) times daily. For fungal infection 12/25/16   Sanjuana Kava, NP  topiramate (TOPAMAX) 50 MG tablet Take 1 tablet (50 mg total) by mouth 2 (two) times daily. For mood stabilization 12/25/16   Sanjuana Kava, NP    Family History No family history on file.  Social History Social History  Substance Use Topics  . Smoking status: Current Every Day Smoker    Packs/day: 0.50  Years: 30.00  . Smokeless tobacco: Former Neurosurgeon  . Alcohol use Yes     Comment: rarely      Allergies   Suboxone [buprenorphine hcl-naloxone hcl]   Review of Systems Review of Systems  Constitutional: Positive for chills. Negative for fever.  Respiratory: Negative for shortness of breath.   Cardiovascular: Negative for chest pain.  Gastrointestinal: Positive for abdominal pain (cramps), diarrhea (once this morning, looser than nl, nonbloody and not watery) and nausea. Negative for blood in stool, constipation and vomiting.  Genitourinary: Negative for dysuria and hematuria.  Musculoskeletal: Positive for myalgias (body aches). Negative for arthralgias.  Skin: Negative for color change.  Allergic/Immunologic: Positive for immunocompromised state (DM2).  Neurological: Positive for tremors. Negative for weakness and numbness.  Psychiatric/Behavioral: Positive for suicidal ideas. Negative for confusion, hallucinations and homicidal ideas.   10 Systems reviewed and are negative for acute change except as noted in the HPI.    Physical Exam Updated Vital Signs BP 137/88 (BP Location: Left Arm)   Pulse 70   Temp 98.4 F (36.9 C) (Oral)   Resp 18   Ht 6' (1.829 m)   Wt 90.3 kg   SpO2 98%   BMI 26.99 kg/m   Physical Exam  Constitutional: He is oriented to person, place, and time. Vital signs are normal. He appears well-developed and well-nourished.  Non-toxic appearance. No distress.  Afebrile, nontoxic, NAD  HENT:  Head: Normocephalic and atraumatic.  Mouth/Throat: Oropharynx is clear and moist and mucous membranes are normal.  Eyes: Conjunctivae and EOM are normal. Right eye exhibits no discharge. Left eye exhibits no discharge.  Neck: Normal range of motion. Neck supple.  Cardiovascular: Normal rate, regular rhythm, normal heart sounds and intact distal pulses.  Exam reveals no gallop and no friction rub.   No murmur heard. Pulmonary/Chest: Effort normal and breath sounds normal. No respiratory distress. He has no decreased breath sounds. He has no wheezes. He has no rhonchi. He has no rales.  Abdominal: Soft. Normal appearance and bowel sounds are normal. He exhibits no distension. There is tenderness in the right lower quadrant, suprapubic area and left lower quadrant. There is no rigidity, no rebound, no guarding, no CVA tenderness, no tenderness at McBurney's point and negative Murphy's sign.  Soft, nondistended, +BS throughout, with very mild lower abd TTP, no r/g/r, neg murphy's, neg mcburney's, no CVA TTP   Musculoskeletal: Normal range of motion.  Neurological: He is alert and oriented to person, place, and time. He has normal strength. No sensory deficit.  Skin: Skin is warm, dry and intact. No rash noted.  Psychiatric: His speech is normal and behavior is normal. He is not actively hallucinating. He exhibits a depressed mood. He expresses suicidal ideation. He expresses no homicidal ideation. He expresses suicidal plans. He expresses no homicidal plans.  Depressed affect, pleasant and  cooperative. Reporting SI with a plan, denies HI/AVH. No tremors noted. Not seeming to respond to internal stimuli.   Nursing note and vitals reviewed.    ED Treatments / Results  Labs (all labs ordered are listed, but only abnormal results are displayed) Labs Reviewed  CBC WITH DIFFERENTIAL/PLATELET - Abnormal; Notable for the following:       Result Value   WBC 2.3 (*)    Hemoglobin 12.8 (*)    HCT 36.8 (*)    Platelets 46 (*)    Neutro Abs 1.4 (*)    All other components within normal limits  COMPREHENSIVE METABOLIC PANEL - Abnormal; Notable  for the following:    Sodium 133 (*)    Chloride 100 (*)    Glucose, Bld 311 (*)    All other components within normal limits  ACETAMINOPHEN LEVEL - Abnormal; Notable for the following:    Acetaminophen (Tylenol), Serum <10 (*)    All other components within normal limits  RAPID URINE DRUG SCREEN, HOSP PERFORMED - Abnormal; Notable for the following:    Opiates POSITIVE (*)    All other components within normal limits  CBG MONITORING, ED - Abnormal; Notable for the following:    Glucose-Capillary 282 (*)    All other components within normal limits  ETHANOL  SALICYLATE LEVEL    EKG  EKG Interpretation None       Radiology No results found.  Procedures Procedures (including critical care time)  Medications Ordered in ED Medications  atorvastatin (LIPITOR) tablet 10 mg (not administered)  buPROPion (WELLBUTRIN SR) 12 hr tablet 150 mg (150 mg Oral Given 01/31/17 1304)  gabapentin (NEURONTIN) capsule 300 mg (not administered)  hydrOXYzine (ATARAX/VISTARIL) tablet 25 mg (not administered)  insulin aspart (novoLOG) injection 2-8 Units (2 Units Subcutaneous Given 01/31/17 1304)  nicotine (NICODERM CQ - dosed in mg/24 hours) patch 21 mg (21 mg Transdermal Patch Applied 01/31/17 1305)  QUEtiapine (SEROQUEL) tablet 200 mg (not administered)  linagliptin (TRADJENTA) tablet 5 mg (not administered)  topiramate (TOPAMAX) tablet 50 mg  (50 mg Oral Given 01/31/17 1304)  alum & mag hydroxide-simeth (MAALOX/MYLANTA) 200-200-20 MG/5ML suspension 30 mL (not administered)  ondansetron (ZOFRAN) tablet 4 mg (not administered)  zolpidem (AMBIEN) tablet 5 mg (not administered)  ibuprofen (ADVIL,MOTRIN) tablet 600 mg (not administered)  acetaminophen (TYLENOL) tablet 650 mg (not administered)  LORazepam (ATIVAN) tablet 1 mg (not administered)  dicyclomine (BENTYL) capsule 20 mg (not administered)  ondansetron (ZOFRAN-ODT) disintegrating tablet 8 mg (8 mg Oral Given 01/31/17 1303)     Initial Impression / Assessment and Plan / ED Course  I have reviewed the triage vital signs and the nursing notes.  Pertinent labs & imaging results that were available during my care of the patient were reviewed by me and considered in my medical decision making (see chart for details).     51 y.o. male here for med clearance, has a bed at [facility]; needing detox from heroin and fentanyl, and having SI with plan to run into traffic. Denies HI/AVH, other drug use, EtOH use. +Smoker, cessation encouraged. Having some withdrawal symptoms of abd cramps, 1 episode of diarrhea, nausea, chills/aches/tremors. On exam, very mild lower abd tenderness, nonperitoneal. Will obtain clearance labs, give zofran, and reassess. Doubt need for imaging, abd tenderness/pain is likely from withdrawal (pt reports similar symptoms in the past).  1:34 PM EtOH undetectable. CBC w/diff showing chronic anemia at baseline, and chronic thrombocytopenia consistent with baseline; also with mild leukopenia, also at baseline; no evidence of bleeding on exam, doubt need for acute intervention of these chronic findings; pt will need to f/up outpatient for these findings, pt made aware-- states he hasn't followed up with his GI specialist in a while so he will follow through once out of here. CMP with gluc 311 but otherwise unremarkable. Salicylate and acetaminophen levels WNL. UDS with +opiates as  expected. Nausea improved with zofran, symptoms improving; pt medically cleared at this time. Psych hold orders and home med orders placed. Please see TTS notes for further documentation of care/dispo. PLEASE NOTE THAT PT IS HERE VOLUNTARILY AT THIS TIME, IF PT TRIES TO LEAVE THEY WOULD NEED  IVC PAPERWORK TAKEN OUT. Pt stable at time of med clearance.     Final Clinical Impressions(s) / ED Diagnoses   Final diagnoses:  Polysubstance abuse  Suicidal ideation  Medical clearance for psychiatric admission  Episode of recurrent major depressive disorder, unspecified depression episode severity (HCC)  Opioid withdrawal (HCC)  Tobacco user  Chronic anemia  Thrombocytopenia (HCC)  Type 2 diabetes mellitus with hyperglycemia, with long-term current use of insulin [facility])    New Prescriptions New Prescriptions   No medications on file     [address] Yaasir Menken, PA-C 01/31/17 1339    Tilden Fossa, MD 02/01/17 [phone]

## 2017-01-31 NOTE — BH Assessment (Signed)
Tele Assessment Note   Chase Reed is an 51 y.o. male who came to [facility] as a walk in seeking inpatient admission for relapse on heroin and severe depressive symptoms. Pt states that he was on a methadone program for 12 years until a relapse 9 months ago. Pt states that he started using heroin and fentanyl intervenously daily 4 days after he discharged from [facility] last month. He states that he was going to follow up with an [facility] but that "fell through" because he was living in [place] without transportation. Pt states that he wants to get clean so that he can be a part of his wife and daughters life who currently are estranged from him and living with his sister. Pt was tearful and states that he we to [facility] in [place] to detox but was turned away due to his insurance not being accepted. He also went to [facility] and they said they couldn't detox him because they didn't see anything in his drug screen. He states he doesn't know how that is possible unless the heroin he has been using is actually "synthetic opiates". He states that they have been calling it "grey ghost" and he feels it may not show up on all drug panels. Pt states that he last used at 10p last night and is starting to have cramps, tremors, chills, body aches and loose stool. He states that he feels if he were to leave here he would "run into traffic" and kill himself because he can't live like this anymore. Pt was tearful and states that he would not be safe if he left. He denies HI or AVH and no history of this. No other drug use reported at this time. Pt will be sent to [facility] ED for medical clearance and will be admitted to [facility] when medically clear per [facility]. [name] NP did a medical screening exam and states that he meets inpatient criteria.   Diagnosis: Bipolar 1 Disorder most recent episode depressed, Opiate use disorder severe    Past Medical History:  Past Medical History:  Diagnosis Date  .  Cirrhosis (HCC)   . Diabetes mellitus without complication (HCC)   . Hepatitis     Past Surgical History:  Procedure Laterality Date  . LITHOTRIPSY      Family History: No family history on file.  Social History:  reports that he has been smoking.  He has a 15.00 pack-year smoking history. He has quit using smokeless tobacco. He reports that he drinks alcohol. He reports that he uses drugs, including Heroin.  Additional Social History:  Alcohol / Drug Use History of alcohol / drug use?: Yes Longest period of sobriety (when/how long): On Methadone for 12 years before 9 months ago Negative Consequences of Use: Financial, Personal relationships, Legal Withdrawal Symptoms: Tremors, Diarrhea, Fever / Chills Substance #1 Name of Substance 1: Heroin 1 - Age of First Use: unknown 1 - Amount (size/oz): unspecified (IV use)  1 - Frequency: daily 1 - Duration: 9 months  1 - Last Use / Amount: yesterday at 10p  CIWA: CIWA-Ar BP: 133/87 Pulse Rate: 72 COWS:    PATIENT STRENGTHS: (choose at least two) Average or above average intelligence Motivation for treatment/growth  Allergies:  Allergies  Allergen Reactions  . Suboxone [Buprenorphine Hcl-Naloxone Hcl] Nausea And Vomiting    Hyper- withdrawals    Home Medications:  (Not in a hospital admission)  OB/GYN Status:  No LMP for male patient.  General Assessment Data Location of Assessment: [facility] Assessment Services TTS Assessment: In system Is this a Tele or Face-to-Face Assessment?: Face-to-Face Is this an Initial Assessment or a Re-assessment for this encounter?: Initial Assessment Marital status: Married Living Arrangements: Non-relatives/Friends Can pt return to current living arrangement?: Yes Admission Status: Voluntary Is patient capable of signing voluntary admission?: Yes Referral Source: Self/Family/Friend Insurance type: MCD  Medical Screening Exam [facility] Walk-in ONLY) Medical Exam completed: Yes [name] NP  )  Crisis Care Plan Living Arrangements: Non-relatives/Friends Name of Psychiatrist: Transport planner Name of Therapist: Transport planner-  Education Status Is patient currently in school?: No Highest grade of school patient has completed: Graduated HS   Risk to self with the past 6 months Suicidal Ideation: Yes-Currently Present Has patient been a risk to self within the past 6 months prior to admission? : Yes Suicidal Intent: No Has patient had any suicidal intent within the past 6 months prior to admission? : No Is patient at risk for suicide?: Yes Suicidal Plan?: Yes-Currently Present Has patient had any suicidal plan within the past 6 months prior to admission? : Yes Specify Current Suicidal Plan: run into traffic Access to Means: Yes Specify Access to Suicidal Means: access to cars What has been your use of drugs/alcohol within the last 12 months?: using heroin daily Previous Attempts/Gestures: No How many times?: 0 Intentional Self Injurious Behavior: None Family Suicide History: No Recent stressful life event(s): Job Loss, Financial Problems, Recent negative physical changes Persecutory voices/beliefs?: No Depression: Yes Depression Symptoms: Despondent, Insomnia, Tearfulness, Isolating, Loss of interest in usual pleasures, Feeling worthless/self pity Substance abuse history and/or treatment for substance abuse?: Yes Suicide prevention information given to non-admitted patients: Not applicable  Risk to Others within the past 6 months Homicidal Ideation: No Does patient have any lifetime risk of violence toward others beyond the six months prior to admission? : No Thoughts of Harm to Others: No Current Homicidal Intent: No Current Homicidal Plan: No Access to Homicidal Means: No Identified Victim: none History of harm to others?: No Assessment of Violence: None Noted Violent Behavior Description: none Does patient have access to weapons?: No Criminal Charges Pending?: No Does  patient have a court date: No Is patient on probation?: No  Psychosis Hallucinations: None noted Delusions: None noted  Mental Status Report Appearance/Hygiene: Unremarkable Eye Contact: Fair Motor Activity: Unremarkable Speech: Logical/coherent Level of Consciousness: Alert Mood: Depressed Affect: Appropriate to circumstance Anxiety Level: Moderate Thought Processes: Coherent Judgement: Unimpaired Orientation: Person, Place, Time, Situation Obsessive Compulsive Thoughts/Behaviors: None  Cognitive Functioning Concentration: Decreased Memory: Recent Intact, Remote Intact IQ: Average Insight: Good Impulse Control: Good Appetite: Poor Weight Loss: 40 Weight Gain: 0 Sleep: Decreased Total Hours of Sleep: 4 Vegetative Symptoms: Decreased grooming  ADLScreening [facility] Assessment Services) Patient's cognitive ability adequate to safely complete daily activities?: Yes Patient able to express need for assistance with ADLs?: Yes Independently performs ADLs?: Yes (appropriate for developmental age)  Prior Inpatient Therapy Prior Inpatient Therapy: Yes Prior Therapy Dates: Feb 2018  Prior Therapy Facilty/Provider(s): [facility] Reason for Treatment: SA  Prior Outpatient Therapy Prior Outpatient Therapy: Yes Prior Therapy Dates: ongoing Prior Therapy Facilty/Provider(s): [facility] Reason for Treatment: SA, Bipolar Does patient have an ACCT team?: No Does patient have Intensive In-House Services?  : No Does patient have [facility] services? : Yes Does patient have P4CC services?: No  ADL Screening (condition at time of admission) Patient's cognitive ability adequate to safely complete daily activities?: Yes Is the patient deaf or have difficulty hearing?: No  Does the patient have difficulty seeing, even when wearing glasses/contacts?: No Does the patient have difficulty concentrating, remembering, or making decisions?: No Patient able to express need for assistance with ADLs?:  Yes Does the patient have difficulty dressing or bathing?: No Independently performs ADLs?: Yes (appropriate for developmental age) Does the patient have difficulty walking or climbing stairs?: No Weakness of Legs: None Weakness of Arms/Hands: None  Home Assistive Devices/Equipment Home Assistive Devices/Equipment: None  Therapy Consults (therapy consults require a physician order) PT Evaluation Needed: No OT Evalulation Needed: No SLP Evaluation Needed: No Abuse/Neglect Assessment (Assessment to be complete while patient is alone) Physical Abuse: Denies Verbal Abuse: Denies Sexual Abuse: Denies Exploitation of patient/patient's resources: Denies Self-Neglect: Denies Values / Beliefs Cultural Requests During Hospitalization: None Spiritual Requests During Hospitalization: None Consults Spiritual Care Consult Needed: No Social Work Consult Needed: No Merchant navy officer (For Healthcare) Does Patient Have a Medical Advance Directive?: No Nutrition Screen- [facility] Adult/WL/AP Patient's home diet: Regular Has the patient recently lost weight without trying?: No Has the patient been eating poorly because of a decreased appetite?: No Malnutrition Screening Tool Score: 0  Additional Information 1:1 In Past 12 Months?: No CIRT Risk: No Elopement Risk: No Does patient have medical clearance?: No     Disposition:  Disposition Initial Assessment Completed for this Encounter: Yes Disposition of Patient: Inpatient treatment program Type of inpatient treatment program: Adult  Hershy Flenner 01/31/2017 11:58 AM

## 2017-01-31 NOTE — ED Notes (Signed)
Patient deemed medically stable and ready for discharge and has bed available at [facility].  Reported called and questions answered. [facility] notified for transport.

## 2017-01-31 NOTE — H&P (Signed)
Behavioral Health Medical Screening Exam  Chase Reed is an 51 y.o. male.  Total Time spent with patient: 20 minutes  Psychiatric Specialty Exam: Physical Exam  Constitutional: He is oriented to person, place, and time. He appears well-developed and well-nourished. He appears distressed.  HENT:  Head: Normocephalic.  Right Ear: External ear normal.  Left Ear: External ear normal.  Neck: Normal range of motion.  Cardiovascular: Normal rate, normal heart sounds and intact distal pulses.   Respiratory: Effort normal and breath sounds normal.  GI: Soft. Bowel sounds are normal.  Musculoskeletal: Normal range of motion.  Neurological: He is alert and oriented to person, place, and time.  Skin: Skin is warm and dry.    Review of Systems  Constitutional: Negative.   HENT: Negative.   Respiratory: Negative.   Cardiovascular: Negative.   Gastrointestinal: Positive for nausea.  Genitourinary: Negative.   Musculoskeletal: Positive for myalgias.  Skin: Negative.   Neurological: Negative.   Endo/Heme/Allergies: Negative.   Psychiatric/Behavioral: Positive for depression, substance abuse and suicidal ideas. Negative for hallucinations and memory loss. The patient is not nervous/anxious and does not have insomnia.     BP 133/87 (BP Location: Left Arm)   Pulse 72   Temp 98.4 F (36.9 C) (Oral)   Resp 18   SpO2 100%    General Appearance: Disheveled  Eye Contact:  Good  Speech:  Clear and Coherent  Volume:  Normal  Mood:  Anxious, Depressed, Hopeless and Worthless  Affect:  Congruent and Depressed  Thought Process:  Coherent, Goal Directed and Linear  Orientation:  Full (Time, Place, and Person)  Thought Content:  Logical  Suicidal Thoughts:  Yes.  with intent/plan  Homicidal Thoughts:  No  Memory:  Immediate;   Good Recent;   Fair Remote;   Fair  Judgement:  Fair  Insight:  Good  Psychomotor Activity:  Normal  Concentration: Concentration: Fair and Attention Span: Good   Recall:  Good  Fund of Knowledge:Good  Language: Good  Akathisia:  No  Handed:  Right  AIMS (if indicated):     Assets:  Communication Skills Desire for Improvement Financial Resources/Insurance Resilience Social Support Vocational/Educational  Sleep:       Musculoskeletal: Strength & Muscle Tone: within normal limits Gait & Station: normal Patient leans: N/A  BP 133/87 (BP Location: Left Arm)   Pulse 72   Temp 98.4 F (36.9 C) (Oral)   Resp 18   SpO2 100%   Recommendations:  Based on my evaluation the patient appears to have an emergency medical condition for which I recommend the patient be transferred to the emergency department for further evaluation.  Pt meets criteria for inpatient psychiatric admission.   Laveda Abbe, NP 01/31/2017, 11:00 AM

## 2017-01-31 NOTE — BHH Counselor (Signed)
Adult Comprehensive Assessment  Patient ID: Chase Reed, male   DOB: 10/01/1966, 51 y.o.   MRN: [id number]  Information Source: Information source: Patient  Current Stressors:  Educational / Learning stressors: Denies stressors Employment / Job issues: Pt is currently unemployed  Family Relationships: Conflictual relationship with some family members.   Pt is currently not living with his spouse and is hoping to be reunited soon. Financial / Lack of resources (include bankruptcy): Limited resources, has been denied disability, but is in appeal process Housing / Lack of housing: Pt does not have a stable place to stay. Pt stays with various acquaintances. Physical health (include injuries & life threatening diseases): Pt has chronic pain and diabetes  Social relationships: Denies stressors Substance abuse: Daily opiod use, stayed sober only 2-3 days after last discharge in 12/2016. Bereavement / Loss: Denies stressors  Living/Environment/Situation:  Living Arrangements: Non-relatives/Friends Living conditions (as described by patient or guardian): Pt staysat different people's houses  How long has patient lived in current situation?: About 6 mo What is atmosphere in current home: Temporary  Family History:  Marital status: Married Number of Years Married: 16 What types of issues is patient dealing with in the relationship?: Pt is not staying with his wife at the moment because they lost their house and needed to split the family up to be able to be accommodated at friend's houses  Does patient have children?: Yes How many children?: 5 (4 step children and 1 of pt's own) How is patient's relationship with their children?: Fantastic with his youngest two but the other children do not like the pt and pt does not care for them either   Childhood History:  By whom was/is the patient raised?: Mother/father and step-parent (Mother and stepfather ) Additional childhood history  information: "Normal" Description of patient's relationship with caregiver when they were a child: "Good" Patient's description of current relationship with people who raised him/her: "Good" How were you disciplined when you got in trouble as a child/adolescent?: Spanking and grouding  Does patient have siblings?: Yes Number of Siblings: 4 (1 brother and 3 sisters ) Description of patient's current relationship with siblings: Not close  Did patient suffer any verbal/emotional/physical/sexual abuse as a child?: No Did patient suffer from severe childhood neglect?: No Has patient ever been sexually abused/assaulted/raped as an adolescent or adult?: No Was the patient ever a victim of a crime or a disaster?: No Witnessed domestic violence?: No Has patient been effected by domestic violence as an adult?: No  Education:  Highest grade of school patient has completed: Graduated high school Currently a student?: No Learning disability?: No  Employment/Work Situation:   Employment situation: Unemployed (Pt has applied for disability) Patient's job has been impacted by current illness:  (NA) What is the longest time patient has a held a job?: 15 yrs  Where was the patient employed at that time?: [employer]  Has patient ever been in the [employer]?: No Has patient ever served in combat?: No Did You Receive Any Psychiatric Treatment/Services While in Equities trader?: No Are There Guns or Other Weapons in Your Home?: No Are These Comptroller?:  (NA)  Financial Resources:   Financial resources: No income (Panhandling, borrowing etc.), has Medicaid, has appealed his disability case. Does patient have a representative payee or guardian?: No  Alcohol/Substance Abuse:   What has been your use of drugs/alcohol within the last 12 months?: IntravenousFentanyl use daily since June amount varies-up to a gram a day -  Relapsed 2-3 days after last discharge in 12/2016 If attempted suicide, did  drugs/alcohol play a role in this?: No Alcohol/Substance Abuse Treatment Hx: Past Tx, Inpatient, Past Tx, Outpatient If yes, describe treatment: [facility] in 90[facility], and methadone program in [place] for 12 years (ended past Jan) Has alcohol/substance abuse ever caused legal problems?: Yes (Drug possesion charges in 1997)  Social Support System:   Patient's Community Support System: Fair Museum/gallery exhibitions officer System: "No one knows it yet, but they're going to be a part of it." Type of faith/religion: Pt does not identify with a faith or religion  How does patient's faith help to cope with current illness?: NA  Leisure/Recreation:   Leisure and Hobbies: Play music   Strengths/Needs:   What things does the patient do well?: Playing music In what areas does patient struggle / problems for patient:  States he is struggling with "everything in my life right now.  Getting through the day."  Discharge Plan:   Does patient have access to transportation?: Yes Will patient be returning to same living situation after discharge?: No What is plan?  Would like to go to either a long-term program such as [facility] and/or [facility] directly from hospital.  Wants to stay in [place] area. Currently receiving community mental health services: Yes (From Whom) (Is open to [facility] in [place] - does medication management, has a Engineer, petroleum, will want to transfer to [facility]) Does patient have financial barriers related to discharge medications?: Yes Patient description of barriers related to discharge medications: Limited resources, has Medicaid  Summary/Recommendations:   Patient is a 51yo male readmitted with overdose on opiates, severe depression, suicidal ideation with a plan to run into traffic.  Primary stressors include relapse 4 days after last hospital discharge in Feb 2018, using heroin and fentanyl intravenously.  His follow-up plans fell through and he remains estranged  from wife and daughters.  Patient will benefit from crisis stabilization, medication evaluation, group therapy and psychoeducation, in addition to case management for discharge planning. At discharge it is recommended that Patient adhere to the established discharge plan and continue in treatment.  Ambrose Mantle, LCSW 01/31/2017, 4:08 PM

## 2017-01-31 NOTE — Tx Team (Signed)
Initial Treatment Plan 01/31/2017 4:06 PM TIRRELL BUCHBERGER ZOX:[id number]    PATIENT STRESSORS: Financial difficulties Substance abuse   PATIENT STRENGTHS: Average or above average intelligence Capable of independent living   PATIENT IDENTIFIED PROBLEMS: Substance Abuse  Ineffectual Individual Coping  "Mental Stability"  "Emotional Stability"               DISCHARGE CRITERIA:  Ability to meet basic life and health needs Adequate post-discharge living arrangements Improved stabilization in mood, thinking, and/or behavior Medical problems require only outpatient monitoring  PRELIMINARY DISCHARGE PLAN: Attend 12-step recovery group  PATIENT/FAMILY INVOLVEMENT: This treatment plan has been presented to and reviewed with the patient, WHIT BRUNI.  The patient and family have been given the opportunity to ask questions and make suggestions.  Almira Bar, RN 01/31/2017, 4:06 PM

## 2017-01-31 NOTE — ED Triage Notes (Signed)
Patient arrived to be medically cleared to go to [facility] where he reports he already has a bed.  Patient is going for detox from opiates and depression.  Specifically he takes Fentanyl and last used last night at 10pm.  Patient denies SI or HI.  Alert, calm, and cooperative he is a diabetic and is on a sliding scale.

## 2017-01-31 NOTE — BHH Suicide Risk Assessment (Signed)
BHH INPATIENT:  Family/Significant Other Suicide Prevention Education  Suicide Prevention Education:  Patient Refusal for Family/Significant Other Suicide Prevention Education: The patient Chase Reed has refused to provide written consent for family/significant other to be provided Family/Significant Other Suicide Prevention Education during admission and/or prior to discharge.  Physician notified.  Carloyn Jaeger Grossman-Orr 01/31/2017, 4:09 PM

## 2017-01-31 NOTE — Progress Notes (Signed)
Patient admitted voluntarily, after walking in to [facility] and then going to [facility] for medical clearance.  Patient states his, "Relapsed on heroine about three days after I left the last time."Was last at [facility]  12/25/16. He reports going from facility to facility since that time, often stating, "But they wouldn't keep me, because I had been there recently." Reports feeling depressed and having thoughts of, "Running into traffic."  Patient reports he has not been taking care of his diabetes. On skin assessment bruises noted to bilateral lower abdomen from insulin injections, per patient. Bilateral feet are dry, flaky and patient complained of neuropathy. Bruises to right inner upper arm and bilateral back of hands. PAtient denies AVH/HI. Paperwork completed. Personal belongings secured in locker. Oriented to unit. Q 15 minute checks initiated

## 2017-02-01 DIAGNOSIS — Z794 Long term (current) use of insulin: Secondary | ICD-10-CM

## 2017-02-01 DIAGNOSIS — Z79899 Other long term (current) drug therapy: Secondary | ICD-10-CM

## 2017-02-01 DIAGNOSIS — F1123 Opioid dependence with withdrawal: Secondary | ICD-10-CM

## 2017-02-01 DIAGNOSIS — F3130 Bipolar disorder, current episode depressed, mild or moderate severity, unspecified: Secondary | ICD-10-CM

## 2017-02-01 LAB — GLUCOSE, CAPILLARY
GLUCOSE-CAPILLARY: 199 mg/dL — AB (ref 65–99)
GLUCOSE-CAPILLARY: 365 mg/dL — AB (ref 65–99)
GLUCOSE-CAPILLARY: 172 mg/dL — AB (ref 65–99)

## 2017-02-01 NOTE — H&P (Signed)
Psychiatric Admission Assessment Adult  Patient Identification: Chase Reed MRN:  [id number] Date of Evaluation:  02/01/2017 Chief Complaint:  Bipolar Principal Diagnosis: Opiate withdrawals                                         Substance Induced Mood Disorder Diagnosis:   Patient Active Problem List   Diagnosis Date Noted  . Bipolar I disorder, most recent episode depressed ([place]) [F31.30] 01/31/2017  . Bipolar 1 disorder, depressed, severe ([place]) [F31.4] 01/31/2017  . Opiate withdrawal ([place]) [F11.23] 12/21/2016  . Tinea cruris [B35.6] 12/21/2016  . Substance induced mood disorder ([place]) [F19.94] 12/21/2016  . Polysubstance (including opioids) dependence w/o physiol dependence ([place]) [F19.20] 05/02/2016  . Substance-induced psychotic disorder with hallucinations ([place]) [F19.951] 05/02/2016  . Elevated lipase [R74.8] 05/02/2016   History of Present Illness:Per assessment note-Chase Reed is an 51 y.o. male who came to [facility] as a walk in seeking inpatient admission for relapse on heroin and severe depressive symptoms. Pt states that he was on a methadone program for 12 years until a relapse 9 months ago. Pt states that he started using heroin and fentanyl intervenously daily 4 days after he discharged from [facility] last month. He states that he was going to follow up with an [facility] but that "fell through" because he was living in [place] without transportation. Pt states that he wants to get clean so that he can be a part of his wife and daughters life who currently are estranged from him and living with his sister. Pt was tearful and states that he we to [facility] in [place] to detox but was turned away due to his insurance not being accepted. He also went to [facility] and they said they couldn't detox him because they didn't see anything in his drug screen. He states he doesn't know how that is possible unless the heroin he has been using is actually "synthetic opiates". He  states that they have been calling it "grey ghost" and he feels it may not show up on all drug panels. Pt states that he last used at 10p last night and is starting to have cramps, tremors, chills, body aches and loose stool. He states that he feels if he were to leave here he would "run into traffic" and kill himself because he can't live like this anymore. Pt was tearful and states that he would not be safe if he left. He denies HI or AVH and no history of this. No other drug use reported at this time.   Associated Signs/Symptoms: Depression Symptoms:  depressed mood, feelings of worthlessness/guilt, difficulty concentrating, (Hypo) Manic Symptoms:  Distractibility, Impulsivity, Anxiety Symptoms:  As above Psychotic Symptoms:  Hallucinations: None PTSD Symptoms: NA Total Time spent with patient: 45 minutes  Past Psychiatric History:noted on 12/2015 admission- Patient denies any past history of Bipolar Disorder as documented. Says he has never been admitted in the past. Says he has never been on psychotropic medications. Reports history of substance use disorder. Says did well on Methadone. He is no longer on Methadone. He denies any past suicidal behavior. Denies any past accidental Od. Says he was in rehab once many years ago  Is the patient at risk to self? No.  Has the patient been a risk to self in the past 6 months? No.  Has the patient been a risk to self  within the distant past? No.  Is the patient a risk to others? No.  Has the patient been a risk to others in the past 6 months? No.  Has the patient been a risk to others within the distant past? No.   Prior Inpatient Therapy: Prior Inpatient Therapy: Yes Prior Therapy Dates: Feb 2018  Prior Therapy Facilty/Provider(s): [facility] Reason for Treatment: SA Prior Outpatient Therapy: Prior Outpatient Therapy: Yes Prior Therapy Dates: ongoing Prior Therapy Facilty/Provider(s): [facility] Reason for Treatment: SA, Bipolar Does patient have an  ACCT team?: No Does patient have Intensive In-House Services?  : No Does patient have [facility] services? : Yes Does patient have P4CC services?: No  Alcohol Screening: Patient refused Alcohol Screening Tool: Yes 1. How often do you have a drink containing alcohol?: Never 9. Have you or someone else been injured as a result of your drinking?: No 10. Has a relative or friend or a doctor or another health worker been concerned about your drinking or suggested you cut down?: No Alcohol Use Disorder Identification Test Final Score (AUDIT): 0 Brief Intervention: AUDIT score less than 7 or less-screening does not suggest unhealthy drinking-brief intervention not indicated Substance Abuse History in the last 12 months:  Yes.   Consequences of Substance Abuse: Medical Consequences:  Reported history of liver disease. Hypertension likely related to cocaine use.  Family Consequences:  Has lost his job and housing. His family is living away from him and he is homeless.  Withdrawal Symptoms:   Cramps aches, nausea, inward restlessness.  Previous Psychotropic Medications: No  Psychological Evaluations: Yes  Past Medical History:  Past Medical History:  Diagnosis Date  . Cirrhosis ([place])   . Diabetes mellitus without complication ([place])   . Hepatitis     Past Surgical History:  Procedure Laterality Date  . LITHOTRIPSY     Family History: History reviewed. No pertinent family history. Family Psychiatric  History: Addiction on both sides of his family.  Tobacco Screening: Have you used any form of tobacco in the last 30 days? (Cigarettes, Smokeless Tobacco, Cigars, and/or Pipes): Yes Tobacco use, Select all that apply: 5 or more cigarettes per day Are you interested in Tobacco Cessation Medications?: Yes, will notify MD for an order Counseled patient on smoking cessation including recognizing danger situations, developing coping skills and basic information about quitting provided: Refused/Declined  practical counseling Social History:  History  Alcohol Use  . Yes    Comment: rarely      History  Drug Use  . Types: Heroin    Additional Social History: Marital status: Married    History of alcohol / drug use?: Yes Longest period of sobriety (when/how long): On Methadone for 12 years before 9 months ago Negative Consequences of Use: Financial, Personal relationships, Legal Withdrawal Symptoms: Tremors, Diarrhea, Fever / Chills Name of Substance 1: Heroin 1 - Age of First Use: unknown 1 - Amount (size/oz): unspecified (IV use)  1 - Frequency: daily 1 - Duration: 9 months  1 - Last Use / Amount: yesterday at 10p       Allergies:   Allergies  Allergen Reactions  . Suboxone [Buprenorphine Hcl-Naloxone Hcl] Nausea And Vomiting    Hyper- withdrawals   Lab Results:  Results for orders placed or performed during the hospital encounter of 01/31/17 (from the past 48 hour(s))  Glucose, capillary     Status: Abnormal   Collection Time: 01/31/17  4:47 PM  Result Value Ref Range   Glucose-Capillary 225 (H) 65 - 99  mg/dL  Glucose, capillary     Status: Abnormal   Collection Time: 01/31/17  9:22 PM  Result Value Ref Range   Glucose-Capillary 182 (H) 65 - 99 mg/dL  Glucose, capillary     Status: Abnormal   Collection Time: 02/01/17  6:02 AM  Result Value Ref Range   Glucose-Capillary 199 (H) 65 - 99 mg/dL  Glucose, capillary     Status: Abnormal   Collection Time: 02/01/17 12:09 PM  Result Value Ref Range   Glucose-Capillary 365 (H) 65 - 99 mg/dL   Comment 1 Notify RN     Blood Alcohol level:  Lab Results  Component Value Date   ETH <5 01/31/2017   ETH <5 78/46/9629    Metabolic Disorder Labs:  Lab Results  Component Value Date   HGBA1C 8.0 (H) 12/22/2016   MPG 183 12/22/2016   No results found for: PROLACTIN No results found for: CHOL, TRIG, HDL, CHOLHDL, VLDL, LDLCALC  Current Medications: Current Facility-Administered Medications  Medication Dose Route  Frequency Provider Last Rate Last Dose  . acetaminophen (TYLENOL) tablet 650 mg  650 mg Oral Q6H PRN Ethelene Hal, NP      . alum & mag hydroxide-simeth (MAALOX/MYLANTA) 200-200-20 MG/5ML suspension 30 mL  30 mL Oral Q4H PRN Ethelene Hal, NP      . atorvastatin (LIPITOR) tablet 10 mg  10 mg Oral q1800 Ethelene Hal, NP      . buPROPion (WELLBUTRIN XL) 24 hr tablet 150 mg  150 mg Oral Daily Ethelene Hal, NP   150 mg at 02/01/17 0847  . cloNIDine (CATAPRES) tablet 0.1 mg  0.1 mg Oral QID Ethelene Hal, NP   0.1 mg at 02/01/17 0846   Followed by  . [START ON 02/02/2017] cloNIDine (CATAPRES) tablet 0.1 mg  0.1 mg Oral BH-qamhs Ethelene Hal, NP       Followed by  . [START ON 02/05/2017] cloNIDine (CATAPRES) tablet 0.1 mg  0.1 mg Oral QAC breakfast Ethelene Hal, NP      . dicyclomine (BENTYL) tablet 20 mg  20 mg Oral Q6H PRN Ethelene Hal, NP   20 mg at 02/01/17 0851  . gabapentin (NEURONTIN) capsule 300 mg  300 mg Oral TID Ethelene Hal, NP   300 mg at 02/01/17 1153  . hydrOXYzine (ATARAX/VISTARIL) tablet 25 mg  25 mg Oral Q6H PRN Ethelene Hal, NP   25 mg at 02/01/17 0851  . insulin aspart (novoLOG) injection 2-8 Units  2-8 Units Subcutaneous TID WC Ethelene Hal, NP   6 Units at 02/01/17 1220  . linagliptin (TRADJENTA) tablet 5 mg  5 mg Oral Daily Ethelene Hal, NP   5 mg at 02/01/17 0846  . loperamide (IMODIUM) capsule 2-4 mg  2-4 mg Oral PRN Ethelene Hal, NP      . magnesium hydroxide (MILK OF MAGNESIA) suspension 30 mL  30 mL Oral Daily PRN Ethelene Hal, NP      . methocarbamol (ROBAXIN) tablet 500 mg  500 mg Oral Q8H PRN Ethelene Hal, NP   500 mg at 02/01/17 0851  . naproxen (NAPROSYN) tablet 500 mg  500 mg Oral BID PRN Ethelene Hal, NP   500 mg at 01/31/17 1725  . nicotine (NICODERM CQ - dosed in mg/24 hours) patch 21 mg  21 mg Transdermal Q0600 Ethelene Hal, NP   21 mg at  02/01/17 0800  . ondansetron (ZOFRAN-ODT) disintegrating tablet 4  mg  4 mg Oral Q6H PRN Ethelene Hal, NP   4 mg at 02/01/17 0851  . QUEtiapine (SEROQUEL) tablet 200 mg  200 mg Oral QHS Ethelene Hal, NP   200 mg at 01/31/17 2132  . topiramate (TOPAMAX) tablet 50 mg  50 mg Oral BID Ethelene Hal, NP   50 mg at 02/01/17 0846  . traZODone (DESYREL) tablet 50 mg  50 mg Oral QHS PRN Ethelene Hal, NP   50 mg at 01/31/17 2132   PTA Medications: Prescriptions Prior to Admission  Medication Sig Dispense Refill Last Dose  . atorvastatin (LIPITOR) 10 MG tablet Take 1 tablet (10 mg total) by mouth daily. For high cholesterol 30 tablet 0 Past Week at Unknown time  . buPROPion (WELLBUTRIN SR) 150 MG 12 hr tablet Take 1 tablet (150 mg total) by mouth daily. For depression 30 tablet 0 Past Week at Unknown time  . gabapentin (NEURONTIN) 300 MG capsule Take 1 capsule (300 mg total) by mouth 3 (three) times daily. For agitation 90 capsule 0 Past Week at Unknown time  . insulin aspart (NOVOLOG) 100 UNIT/ML injection Inject 2-8 Units into the skin See admin instructions. Sliding scale.over 250--2 units over 300--4 units over 350--6 units over 400--8 units Up to 50 units daily: For diabetes management 10 mL 0 01/31/2017 at Unknown time  . nicotine (NICODERM CQ - DOSED IN MG/24 HOURS) 21 mg/24hr patch Place 1 patch (21 mg total) onto the skin daily. For smoking cessation 28 patch 0 Past Month at Unknown time  . sitaGLIPtin (JANUVIA) 100 MG tablet Take 1 tablet (100 mg total) by mouth daily. For diabetes management 30 tablet 0 Past Week at Unknown time    Musculoskeletal: Strength & Muscle Tone: Unable to assess at this time.  Gait & Station: Unable to assess at this time.  Patient leans: Unable to assess at this time.    Psychiatric Specialty Exam: Physical Exam  Vitals reviewed. Constitutional: He is oriented to person, place, and time.  Cardiovascular: Normal rate.    Neurological: He is alert and oriented to person, place, and time.  Psychiatric: He has a normal mood and affect. His behavior is normal.   Unable to assess at this time.   Review of Systems  Psychiatric/Behavioral: Positive for depression and substance abuse.   as above  Blood pressure (!) 97/59, pulse 70, temperature 98 F (36.7 C), temperature source Oral, resp. rate 16, height 6' (1.829 m), weight 85.3 kg (188 lb), SpO2 100 %.Body mass index is 25.5 kg/m.  General Appearance: Disheveled and Guarded  Eye Contact:  Minimal  Speech:  Clear and Coherent  Volume:  Normal  Mood:  Dysphoric  Affect:  Blunt and Congruent  Thought Process:  Coherent  Orientation:  Full (Time, Place, and Person)  Thought Content:  Paranoid Ideation  Suicidal Thoughts:  No  Homicidal Thoughts:  No  Memory:  Remote: fair, Recent: fair  Judgement:  Poor  Insight:  Good  Psychomotor Activity:  Restlessness  Concentration:  limited  Recall:  afa  Fund of Knowledge:  fair  Language:  Good  Akathisia:  NA  Handed:  right  AIMS (if indicated):     Assets:  Communication Skills Desire for Improvement Resilience  ADL's:  Impaired  Cognition:  Impaired,  Moderate  Sleep:  Number of Hours: 6.75     I agree with current treatment plan on 02/01/2017, Patient seen face-to-face for psychiatric evaluation follow-up, chart reviewed and case  discussed with the MD Izediuno.Reviewed the information documented and agree with the treatment plan.  Treatment Plan Summary: Daily contact with patient to assess and evaluate symptoms and progress in treatment and Medication management  Continue with Wellbutrin 150 mg, Neurontin 300 mg, Seroquel 200 mg and Topamax 50 mg  for mood stabilization. Continue with Trazodone 50 mg for insomnia Started on COWs [place] Protocol Will continue to monitor vitals ,medication compliance and treatment side effects while patient is here.  Reviewed labs Glucose 199 elevated ,BAL -  , UDS - pos for opiates CSW will start working on disposition.  Patient to participate in therapeutic milieu   Observation Level/Precautions:  Detox  Laboratory:  CBC, CMP, TSH, lipid and UDS  Psychotherapy:  Individual and group session  Medications:  See Above  Consultations:  Psychiatry   Discharge Concerns:  Safety, stabilization, and risk of access to medication and medication stabilization   Estimated LOS:5-7days  Other:     Physician Treatment Plan for Primary Diagnosis: Bipolar I disorder, most recent episode depressed ([place]) Long Term Goal(s): Improvement in symptoms so as ready for discharge  Short Term Goals: Ability to identify changes in lifestyle to reduce recurrence of condition will improve and Ability to verbalize feelings will improve  Physician Treatment Plan for Secondary Diagnosis: Principal Problem:   Bipolar I disorder, most recent episode depressed ([place]) Active Problems:   Polysubstance (including opioids) dependence w/o physiol dependence ([place])   Bipolar 1 disorder, depressed, severe ([place])  Long Term Goal(s): Improvement in symptoms so as ready for discharge  Short Term Goals: Ability to identify changes in lifestyle to reduce recurrence of condition will improve, Ability to verbalize feelings will improve and Ability to identify and develop effective coping behaviors will improve  I certify that inpatient services furnished can reasonably be expected to improve the patient's condition.    Derrill Center, NP 4/1/20182:16 PM

## 2017-02-01 NOTE — Plan of Care (Signed)
Problem: Safety: Goal: Ability to remain free from injury will improve Outcome: Progressing Pt denies self harm thoughts. Pt denies suicidal thoughts. No self harm behaviors observed. Pt verbally contracts for safety. Pt safety maintained.

## 2017-02-01 NOTE — [facility] Group Notes (Signed)
[facility] LCSW Group Therapy Note   02/01/2017  10:10 - 11:10 AM   Type of Therapy and Topic: Group Therapy: Feelings Around Returning Home & Establishing a Supportive Framework and Activity to Identify signs of Improvement or Decompensation   Participation Level: Did Not Attend; invited to participate yet did not despite overhead announcement and encouragement by staff   Description of Group:  Patients first processed thoughts and feelings about up coming discharge. These included fears of upcoming changes, lack of change, new living environments, judgements and expectations from others and overall stigma of MH issues. We then discussed what is a supportive framework? What does it look like feel like and how do I discern it from and unhealthy non-supportive network? Learn how to cope when supports are not helpful and don't support you. Discuss what to do when your family/friends are not supportive.   Catherine C Harrill, LCSW    

## 2017-02-01 NOTE — Progress Notes (Signed)
Date:  02/01/2017 Time:  1130 am  Group Topic/Focus:  Coping With Mental Health Crisis:   The purpose of this group is to help patients identify strategies for coping with mental health crisis.  Group discusses possible causes of crisis and ways to manage them effectively.  Participation Level:  Did Not Attend  Participation Quality:    Affect:    Cognitive:    Insight:   Engagement in Group:    Modes of Intervention:    Additional Comments:    [name] L 02/01/2017, 3:02 PM  

## 2017-02-01 NOTE — [facility] Group Notes (Signed)
Patient did not attend AA group. 

## 2017-02-01 NOTE — Progress Notes (Addendum)
Nursing Progress Note 7p-7a  D) Patient presents calm and cooperative but is isolative to his room. Patient appears diaphoretic and nauseous. Patient provided with a pitcher of water, ginger ale and snacks. Patient states he "feels awful" but is cooperative with Clinical research associate. COW score 8. Patient denies SI/HI/AVH or pain. Patient contracts for safety on the unit. Patient did not attend group.  A) Emotional support given. 1:1 interaction and active listening provided. Patient medicated with PM orders as prescribed. Medications reviewed with patient. Patient verbalized understanding of medications without further questions. Opportunities for questions or concerns presented to patient. Patient encouraged to continue to work on treatment goals. Labs, vital signs and patient behavior monitored throughout shift. Patient safety maintained with q15 min safety checks.  R) Patient receptive to interaction with nurse. Patient remains safe on the unit at this time. Patient denies any adverse medication reactions at this time. Patient is resting in bed without complaints. Will continue to monitor.

## 2017-02-01 NOTE — [facility] Group Notes (Signed)
Adult Psychoeducational Group Note  Date:01/31/2017 Time:  8:00 PM  Group Topic/Focus:  Wrap-Up Group:   The focus of this group is to help patients review their daily goal of treatment and discuss progress on daily workbooks.  Participation Level:  Did Not Attend  Participation Quality:  Did not attend.  Affect:  Did not attend.  Cognitive:  Did not attend.  Insight: None  Engagement in Group:  Did not attend.  Modes of Intervention:  Did not attend.  Additional Comments:  Did not attend.  Elmore Guise N 02/01/2017, 12:59 AM

## 2017-02-01 NOTE — Progress Notes (Signed)
Pt declined CBG 

## 2017-02-01 NOTE — Plan of Care (Signed)
Problem: Safety: Goal: Periods of time without injury will increase Outcome: Progressing Patient contracts for safety on the unit. Patient is on q15 minute safety checks.   

## 2017-02-01 NOTE — [facility] Suicide Risk Assessment (Signed)
[facility] Admission Suicide Risk Assessment   Nursing information obtained from:    Demographic factors:    Current Mental Status:    Loss Factors:    Historical Factors:    Risk Reduction Factors:     Total Time spent with patient: 15 minutes Principal Problem: Bipolar I disorder, most recent episode depressed ([facility]) Diagnosis:   Patient Active Problem List   Diagnosis Date Noted  . Bipolar I disorder, most recent episode depressed ([facility]) [F31.30] 01/31/2017  . Bipolar 1 disorder, depressed, severe ([facility]) [F31.4] 01/31/2017  . Opiate withdrawal ([facility]) [F11.23] 12/21/2016  . Tinea cruris [B35.6] 12/21/2016  . Substance induced mood disorder ([facility]) [F19.94] 12/21/2016  . 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 Elevated lipase [R74.8] 05/02/2016   Subjective Data:  51 yo Caucasian male, separated, homeless and unemployed. Background history of Substance Use Disorder and Mood Disorder. Recently discharged from our unit. Self presented to the ER seeking detox. Expressed suicidal thoughts but tells me that he just wants help. UDS is positive for opiates.  No current thoughts of suicide. No homicidal thoughts. Not expressing any stressors. No persecutory delusion. Not expressing any other delusion. Has been in bed as he is "dope sick"   Continued Clinical Symptoms:  Alcohol Use Disorder Identification Test Final Score (AUDIT): 0 The "Alcohol Use Disorders Identification Test", Guidelines for Use in Primary Care, Second Edition.  World Science writer [facility]). Score between 0-7:  no or low risk or alcohol related problems. Score between 8-15:  moderate risk of alcohol related problems. Score between 16-19:  high risk of alcohol related problems. Score 20 or above:  warrants further diagnostic evaluation for alcohol dependence and treatment.   CLINICAL  FACTORS:  Substance use disorder Opiate withdrawals Depression    Musculoskeletal: Strength & Muscle Tone: Normal Gait & Station: Normal Patient leans: Normal  Psychiatric Specialty Exam: Physical Exam  As in H&P  ROS   Blood pressure (!) 97/59, pulse 70, temperature 98 F (36.7 C), temperature source Oral, resp. rate 16, height 6' (1.829 m), weight 85.3 kg (188 lb), SpO2 100 %.Body mass index is 25.5 kg/m.  General Appearance: In bed, mild distress. Minimal engagement. Not internally distracted.   Eye Contact:  Minimal  Speech:  Soft spoken. Not pressured.   Volume: low  Mood:  In pain and feels down  Affect: Blunted and mood congruent  Thought Process:  Logical and linear  Orientation:  Fully oriented to person, place and time.   Thought Content: No delusional theme. No preoccupation with violent thoughts. No negative ruminations. No obsession.  No hallucination in any modality.   Suicidal Thoughts:  None  Homicidal Thoughts:  None  Memory:  Unable to assess at this time  Judgement:  Poor  Insight:  Limited  Psychomotor Activity:  Decreased   Concentration:  Impaired  Recall:  Unable to assess at this time  Fund of Knowledge:  Fair  Language:  Good  Akathisia:  None   Handed:    AIMS (if indicated):     Assets:  As in H&P  ADL's:  Impaired  Cognition:  Impaired,  Moderate  Sleep:  Number of Hours: 6.75      COGNITIVE FEATURES THAT CONTRIBUTE TO RISK:  Loss of executive function    SUICIDE RISK:   Moderate:  Frequent suicidal ideation with limited intensity, and duration, some specificity in terms of plans, no associated intent,  good self-control, limited dysphoria/symptomatology, some risk factors present, and identifiable protective factors, including available and accessible social support.  PLAN OF CARE:  1. Detox regimen 2. Recommence home medications   I certify that inpatient services furnished can reasonably be expected to improve the patient's  condition.   Georgiann Cocker, MD 02/01/2017, 3:27 PM

## 2017-02-01 NOTE — Progress Notes (Signed)
D: Pt presents with flat affect. Pt denies depression and anxiety. Pt denies suicidal thoughts. Pt reports withdrawal symptoms of sweats, tremors, abd cramps, muscle spasms, crawling skin sensation, anxiety and irritability. Pt given clonidine per protocol and prns at his request for discomfort. Pt have been unable to attend meals and groups due to withdrawal symptoms.  A: Medications reviewed with pt. Medications administered as ordered per MD. Verbal support provided. Pt encouraged to attend groups. 15 minute checks performed for safety.  R: Pt receptive to tx.

## 2017-02-02 LAB — GLUCOSE, CAPILLARY
GLUCOSE-CAPILLARY: 357 mg/dL — AB (ref 65–99)
Glucose-Capillary: 211 mg/dL — ABNORMAL HIGH (ref 65–99)
Glucose-Capillary: 188 mg/dL — ABNORMAL HIGH (ref 65–99)
Glucose-Capillary: 235 mg/dL — ABNORMAL HIGH (ref 65–99)

## 2017-02-02 MED ORDER — ENSURE ENLIVE PO LIQD
237.0000 mL | Freq: Two times a day (BID) | ORAL | Status: DC | PRN
  Administered 2017-02-02: 237 mL via ORAL

## 2017-02-02 NOTE — Progress Notes (Signed)
NUTRITION ASSESSMENT  Pt identified as at risk on the Malnutrition Screen Tool  INTERVENTION: 1. Supplements: Provide Ensure Enlive po PRN, each supplement provides 350 kcal and 20 grams of protein  NUTRITION DIAGNOSIS: Unintentional weight loss related to sub-optimal intake as evidenced by pt report.   Goal: Pt to meet >/= 90% of their estimated nutrition needs.  Monitor:  PO intake  Assessment:  Pt admitted with polysubstance abuse (heroin). Pt has lost 12 lb since 2/17 (6% wt loss x 1.5 months ,significant for time frame). Pt would benefit from nutritional supplementation if pt is not eating well at [facility]. Will order Ensure supplements PRN.   Height: Ht Readings from Last 1 Encounters:  01/31/17 6' (1.829 m)    Weight: Wt Readings from Last 1 Encounters:  01/31/17 188 lb (85.3 kg)    Weight Hx: Wt Readings from Last 10 Encounters:  01/31/17 188 lb (85.3 kg)  01/31/17 199 lb (90.3 kg)  12/21/16 196 lb (88.9 kg)  12/20/16 200 lb (90.7 kg)    BMI:  Body mass index is 25.5 kg/m. Pt meets criteria for overweight based on current BMI.  Estimated Nutritional Needs: Kcal: 25-30 kcal/kg Protein: > 1 gram protein/kg Fluid: 1 ml/kcal  Diet Order: Diet Carb Modified Fluid consistency: Thin; Room service appropriate? No Pt is also offered choice of unit snacks mid-morning and mid-afternoon.  Pt is eating as desired.   Lab results and medications reviewed.   Tilda Franco, MS, RD, LDN Pager: [phone] After Hours Pager: [phone]

## 2017-02-02 NOTE — Progress Notes (Signed)
D: Patient is isolative to his room.  He complains of severe withdrawal symptoms such as chills, body aches and overall malaise.  Patient declines to go to group, stating he "doesn't feel well."  He denies any thoughts of self harm.  He denies HI and does not appear to be responding to internal stimuli.  Patient refused to fill out self inventory for staff.  He presents with flat, blunted affect and depressed mood.  He is pleasant upon approach. A: Continue to monitor medication management and MD orders.  Safety checks completed every 15 minutes per protocol.  Offer support and encouragement as needed. R: Patient is receptive to staff; his behavior is appropriate.

## 2017-02-02 NOTE — [facility] Group Notes (Signed)
[facility] LCSW Group Therapy  02/02/2017 2:35 PM  Type of Therapy:  Group Therapy  Participation Level:  Did Not Attend-pt invited. Chose to remain in bed. Pt reports withdrawals/drowsiness.   Summary of Progress/Problems: Today's Topic: Overcoming Obstacles. Patients identified one short term goal and potential obstacles in reaching this goal. Patients processed barriers involved in overcoming these obstacles. Patients identified steps necessary for overcoming these obstacles and explored motivation (internal and external) for facing these difficulties head on.   Chase Reed N Smart LCSW 02/02/2017, 2:35 PM

## 2017-02-02 NOTE — Tx Team (Signed)
Interdisciplinary Treatment and Diagnostic Plan Update  02/02/2017 Time of Session: 0930 Chase Reed MRN: [id number]  Principal Diagnosis: Bipolar I disorder, most recent episode depressed (HCC)  Secondary Diagnoses: Principal Problem:   Bipolar I disorder, most recent episode depressed (HCC) Active Problems:   Polysubstance (including opioids) dependence w/o physiol dependence (HCC)   Bipolar 1 disorder, depressed, severe (HCC)   Current Medications:  Current Facility-Administered Medications  Medication Dose Route Frequency Provider Last Rate Last Dose  . acetaminophen (TYLENOL) tablet 650 mg  650 mg Oral Q6H PRN Laveda Abbe, NP      . alum & mag hydroxide-simeth (MAALOX/MYLANTA) 200-200-20 MG/5ML suspension 30 mL  30 mL Oral Q4H PRN Laveda Abbe, NP      . atorvastatin (LIPITOR) tablet 10 mg  10 mg Oral q1800 Laveda Abbe, NP   10 mg at 02/01/17 1719  . buPROPion (WELLBUTRIN XL) 24 hr tablet 150 mg  150 mg Oral Daily Laveda Abbe, NP   150 mg at 02/01/17 0847  . cloNIDine (CATAPRES) tablet 0.1 mg  0.1 mg Oral QID Laveda Abbe, NP   0.1 mg at 02/01/17 2147   Followed by  . cloNIDine (CATAPRES) tablet 0.1 mg  0.1 mg Oral BH-qamhs Laveda Abbe, NP       Followed by  . [START ON 02/05/2017] cloNIDine (CATAPRES) tablet 0.1 mg  0.1 mg Oral QAC breakfast Laveda Abbe, NP      . dicyclomine (BENTYL) tablet 20 mg  20 mg Oral Q6H PRN Laveda Abbe, NP   20 mg at 02/01/17 0851  . gabapentin (NEURONTIN) capsule 300 mg  300 mg Oral TID Laveda Abbe, NP   300 mg at 02/01/17 1719  . hydrOXYzine (ATARAX/VISTARIL) tablet 25 mg  25 mg Oral Q6H PRN Laveda Abbe, NP   25 mg at 02/01/17 1722  . insulin aspart (novoLOG) injection 2-8 Units  2-8 Units Subcutaneous TID WC Laveda Abbe, NP   6 Units at 02/01/17 1220  . linagliptin (TRADJENTA) tablet 5 mg  5 mg Oral Daily Laveda Abbe, NP   5 mg at 02/01/17 0846   . loperamide (IMODIUM) capsule 2-4 mg  2-4 mg Oral PRN Laveda Abbe, NP      . magnesium hydroxide (MILK OF MAGNESIA) suspension 30 mL  30 mL Oral Daily PRN Laveda Abbe, NP      . methocarbamol (ROBAXIN) tablet 500 mg  500 mg Oral Q8H PRN Laveda Abbe, NP   500 mg at 02/01/17 1722  . naproxen (NAPROSYN) tablet 500 mg  500 mg Oral BID PRN Laveda Abbe, NP   500 mg at 01/31/17 1725  . nicotine (NICODERM CQ - dosed in mg/24 hours) patch 21 mg  21 mg Transdermal Q0600 Laveda Abbe, NP   21 mg at 02/01/17 0800  . ondansetron (ZOFRAN-ODT) disintegrating tablet 4 mg  4 mg Oral Q6H PRN Laveda Abbe, NP   4 mg at 02/01/17 1722  . QUEtiapine (SEROQUEL) tablet 200 mg  200 mg Oral QHS Laveda Abbe, NP   200 mg at 02/01/17 2147  . topiramate (TOPAMAX) tablet 50 mg  50 mg Oral BID Laveda Abbe, NP   50 mg at 02/01/17 1719  . traZODone (DESYREL) tablet 50 mg  50 mg Oral QHS PRN Laveda Abbe, NP   50 mg at 02/01/17 2147   PTA Medications: Prescriptions Prior to Admission  Medication Sig Dispense  Refill Last Dose  . atorvastatin (LIPITOR) 10 MG tablet Take 1 tablet (10 mg total) by mouth daily. For high cholesterol 30 tablet 0 Past Week at Unknown time  . buPROPion (WELLBUTRIN SR) 150 MG 12 hr tablet Take 1 tablet (150 mg total) by mouth daily. For depression 30 tablet 0 Past Week at Unknown time  . gabapentin (NEURONTIN) 300 MG capsule Take 1 capsule (300 mg total) by mouth 3 (three) times daily. For agitation 90 capsule 0 Past Week at Unknown time  . insulin aspart (NOVOLOG) 100 UNIT/ML injection Inject 2-8 Units into the skin See admin instructions. Sliding scale.over 250--2 units over 300--4 units over 350--6 units over 400--8 units Up to 50 units daily: For diabetes management 10 mL 0 01/31/2017 at Unknown time  . nicotine (NICODERM CQ - DOSED IN MG/24 HOURS) 21 mg/24hr patch Place 1 patch (21 mg total) onto the skin daily. For  smoking cessation 28 patch 0 Past Month at Unknown time  . sitaGLIPtin (JANUVIA) 100 MG tablet Take 1 tablet (100 mg total) by mouth daily. For diabetes management 30 tablet 0 Past Week at Unknown time    Patient Stressors: Financial difficulties Substance abuse  Patient Strengths: Average or above average intelligence Capable of independent living  Treatment Modalities: Medication Management, Group therapy, Case management,  1 to 1 session with clinician, Psychoeducation, Recreational therapy.   Physician Treatment Plan for Primary Diagnosis: Bipolar I disorder, most recent episode depressed (HCC) Long Term Goal(s): Improvement in symptoms so as ready for discharge Improvement in symptoms so as ready for discharge   Short Term Goals: Ability to identify changes in lifestyle to reduce recurrence of condition will improve Ability to verbalize feelings will improve Ability to identify changes in lifestyle to reduce recurrence of condition will improve Ability to verbalize feelings will improve Ability to identify and develop effective coping behaviors will improve  Medication Management: Evaluate patient's response, side effects, and tolerance of medication regimen.  Therapeutic Interventions: 1 to 1 sessions, Unit Group sessions and Medication administration.  Evaluation of Outcomes: Progressing  Physician Treatment Plan for Secondary Diagnosis: Principal Problem:   Bipolar I disorder, most recent episode depressed (HCC) Active Problems:   Polysubstance (including opioids) dependence w/o physiol dependence (HCC)   Bipolar 1 disorder, depressed, severe (HCC)  Long Term Goal(s): Improvement in symptoms so as ready for discharge Improvement in symptoms so as ready for discharge   Short Term Goals: Ability to identify changes in lifestyle to reduce recurrence of condition will improve Ability to verbalize feelings will improve Ability to identify changes in lifestyle to reduce  recurrence of condition will improve Ability to verbalize feelings will improve Ability to identify and develop effective coping behaviors will improve     Medication Management: Evaluate patient's response, side effects, and tolerance of medication regimen.  Therapeutic Interventions: 1 to 1 sessions, Unit Group sessions and Medication administration.  Evaluation of Outcomes: Progressing   RN Treatment Plan for Primary Diagnosis: Bipolar I disorder, most recent episode depressed (HCC) Long Term Goal(s): Knowledge of disease and therapeutic regimen to maintain health will improve  Short Term Goals: Ability to remain free from injury will improve, Ability to verbalize feelings will improve and Ability to disclose and discuss suicidal ideas  Medication Management: RN will administer medications as ordered by provider, will assess and evaluate patient's response and provide education to patient for prescribed medication. RN will report any adverse and/or side effects to prescribing provider.  Therapeutic Interventions: 1 on 1 counseling  sessions, Psychoeducation, Medication administration, Evaluate responses to treatment, Monitor vital signs and CBGs as ordered, Perform/monitor CIWA, COWS, AIMS and Fall Risk screenings as ordered, Perform wound care treatments as ordered.  Evaluation of Outcomes: Progressing   LCSW Treatment Plan for Primary Diagnosis: Bipolar I disorder, most recent episode depressed (HCC) Long Term Goal(s): Safe transition to appropriate next level of care at discharge, Engage patient in therapeutic group addressing interpersonal concerns.  Short Term Goals: Engage patient in aftercare planning with referrals and resources, Facilitate patient progression through stages of change regarding substance use diagnoses and concerns and Identify triggers associated with mental health/substance abuse issues  Therapeutic Interventions: Assess for all discharge needs, 1 to 1 time  with Social worker, Explore available resources and support systems, Assess for adequacy in community support network, Educate family and significant other(s) on suicide prevention, Complete Psychosocial Assessment, Interpersonal group therapy.  Evaluation of Outcomes: Progressing   Progress in Treatment: Attending groups: Yes. Participating in groups: Yes. Taking medication as prescribed: Yes. Toleration medication: Yes. Family/Significant other contact made: SPE completed with pt; pt declined to consent to family contact.  Patient understands diagnosis: Yes. Discussing patient identified problems/goals with staff: Yes. Medical problems stabilized or resolved: Yes. Denies suicidal/homicidal ideation: Yes. Issues/concerns per patient self-inventory: No. Other: n/a  New problem(s) identified: No, Describe:  n/a  New Short Term/Long Term Goal(s): detox; medication stabilizaton; development of comprehensive mental wellness/sobriety plan.   Discharge Plan or Barriers: CSW assessing for appropriate referrals. Pt interested in long term rehab or [facility].   Reason for Continuation of Hospitalization: Anxiety Depression Medication stabilization Withdrawal symptoms   Estimated Length of Stay: 3-5 days   Attendees: Patient: 02/02/2017 8:39 AM  Physician: Dr. Jackquline Berlin MD 02/02/2017 8:39 AM  Nursing: Wilhelmenia Blase RN 02/02/2017 8:39 AM  RN Care Manager: Onnie Boer CM 02/02/2017 8:39 AM  Social Worker: Trula Slade, LCSW 02/02/2017 8:39 AM  Recreational Therapist: Juliann Pares 02/02/2017 8:39 AM  Other: Armandina Stammer NP; Hillery Jacks NP 02/02/2017 8:39 AM  Other:  02/02/2017 8:39 AM  Other: 02/02/2017 8:39 AM    Scribe for Treatment Team: [name] Smart, LCSW 02/02/2017 8:39 AM

## 2017-02-02 NOTE — Progress Notes (Signed)
D: Patient pleasant and cooperative with care this shift, but he remained isolative to his room and with no peer interaction. The patient states he is tired and just wants to rest; he declined to go to group session. A: Encourage staff/peer interaction, medication compliance, and group participation. Administer medications as ordered, maintain Q 15 minute safety checks.R: Pt compliant with medications this shift. Pt denies SI at this time and verbally contracts for safety. No signs/symptoms of distress noted.

## 2017-02-02 NOTE — [facility] Group Notes (Addendum)
Chase Reed was invited to attend group.  He did not attend.       spiritual care group on grief and loss facilitated by chaplain Chase Reed   Group opened with brief discussion and psycho-social ed around grief and loss in relationships and in relation to self - identifying life patterns, circumstances, changes that cause losses. Established group norm of speaking from own life experience. Group goal of establishing open and affirming space for members to share loss and experience with grief, normalize grief experience and provide psycho social education and grief support.       Belva Crome, MDiv

## 2017-02-02 NOTE — Progress Notes (Signed)
[facility] referral faxed per pt request.   Trula Slade, MSW, LCSW Clinical Social Worker 02/02/2017 1:11 PM

## 2017-02-02 NOTE — Progress Notes (Signed)
Recreation Therapy Notes  Date: 02/02/17 Time: 0930 Location: [address] Dayroom  Group Topic: Stress Management  Goal Area(s) Addresses:  Patient will verbalize importance of using healthy stress management.  Patient will identify positive emotions associated with healthy stress management.   Intervention: Stress Management  Activity :  LRT introduced the stress management technique of guided imagery.  LRT read a script to engage patients in guided imagery.  Patients were to follow along as the script was read in order to fully participate in the technique.  Education:  Stress Management, Discharge Planning.   Education Outcome: Acknowledges edcuation/In group clarification offered/Needs additional education  Clinical Observations/Feedback: Pt did not attend group.   Ceria Suminski, LRT/CTRS         Chase Reed A 02/02/2017 12:03 PM 

## 2017-02-02 NOTE — Progress Notes (Signed)
[facility] MD Progress Note  02/02/2017 3:21 PM Chase Reed  MRN:  [id number] Subjective:   51 yo Caucasian male, separated, homeless and unemployed. Background history of Substance Use Disorder and Mood Disorder. Recently discharged from our unit. Self presented to the ER seeking detox. Expressed suicidal thoughts but tells me that he just wants help. UDS is positive for opiates.   Nursing staff reports that he has been isolating self in his room. Minimal interaction with pairs. He complains of aches and pains. He has been having nausea. He has not expressed any futility thoughts.   Seen today. Still in withdrawals. No hallucinations. No suicidal thoughts. No thoughts of violence. He is tolerating his medications well. He is hopeful.   Principal Problem: Bipolar I disorder, most recent episode depressed (HCC) Diagnosis:   Patient Active Problem List   Diagnosis Date Noted  . Bipolar I disorder, most recent episode depressed (HCC) [F31.30] 01/31/2017  . Bipolar 1 disorder, depressed, severe (HCC) [F31.4] 01/31/2017  . Opiate withdrawal (HCC) [F11.23] 12/21/2016  . Tinea cruris [B35.6] 12/21/2016  . Substance induced mood disorder (HCC) [F19.94] 12/21/2016  . 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 Elevated lipase [R74.8] 05/02/2016   Total Time spent with patient: 30 minutes  Past Psychiatric History: As in H&P  Past Medical History:  Past Medical History:  Diagnosis Date  . Cirrhosis (HCC)   . Diabetes mellitus without complication (HCC)   . Hepatitis     Past Surgical History:  Procedure Laterality Date  . LITHOTRIPSY     Family History: History reviewed. No pertinent family history. Family Psychiatric  History: As in H&P Social History:  History  Alcohol Use  . Yes    Comment: rarely      History  Drug Use  . Types: Heroin     Social History   Social History  . Marital status: Married    Spouse name: N/A  . Number of children: N/A  . Years of education: N/A   Social History Main Topics  . Smoking status: Current Every Day Smoker    Packs/day: 0.50    Years: 30.00  . Smokeless tobacco: Former Neurosurgeon  . Alcohol use Yes     Comment: rarely   . Drug use: Yes    Types: Heroin  . Sexual activity: No   Other Topics Concern  . None   Social History Narrative  . None   Additional Social History:    History of alcohol / drug use?: Yes Longest period of sobriety (when/how long): On Methadone for 12 years before 9 months ago Negative Consequences of Use: Financial, Personal relationships, Legal Withdrawal Symptoms: Tremors, Diarrhea, Fever / Chills Name of Substance 1: Heroin 1 - Age of First Use: unknown 1 - Amount (size/oz): unspecified (IV use)  1 - Frequency: daily 1 - Duration: 9 months  1 - Last Use / Amount: yesterday at 10p       Sleep: Good  Appetite:  Good  Current Medications: Current Facility-Administered Medications  Medication Dose Route Frequency Provider Last Rate Last Dose  . acetaminophen (TYLENOL) tablet 650 mg  650 mg Oral Q6H PRN Laveda Abbe, NP      . alum & mag hydroxide-simeth (MAALOX/MYLANTA) 200-200-20 MG/5ML suspension 30 mL  30 mL Oral Q4H PRN Laveda Abbe, NP      . atorvastatin (LIPITOR) tablet 10 mg  10 mg Oral  [id number] Laveda Abbe, NP   10 mg at 02/01/17 1719  . buPROPion (WELLBUTRIN XL) 24 hr tablet 150 mg  150 mg Oral Daily Laveda Abbe, NP   150 mg at 02/02/17 0959  . cloNIDine (CATAPRES) tablet 0.1 mg  0.1 mg Oral QID Laveda Abbe, NP   0.1 mg at 02/02/17 1000   Followed by  . cloNIDine (CATAPRES) tablet 0.1 mg  0.1 mg Oral BH-qamhs Laveda Abbe, NP       Followed by  . [START ON 02/05/2017] cloNIDine (CATAPRES) tablet 0.1 mg  0.1 mg Oral QAC breakfast Laveda Abbe, NP      . dicyclomine (BENTYL) tablet 20  mg  20 mg Oral Q6H PRN Laveda Abbe, NP   20 mg at 02/02/17 1111  . feeding supplement (ENSURE ENLIVE) (ENSURE ENLIVE) liquid 237 mL  237 mL Oral BID PRN Nelly Rout, MD   237 mL at 02/02/17 1001  . gabapentin (NEURONTIN) capsule 300 mg  300 mg Oral TID Laveda Abbe, NP   300 mg at 02/02/17 1209  . hydrOXYzine (ATARAX/VISTARIL) tablet 25 mg  25 mg Oral Q6H PRN Laveda Abbe, NP   25 mg at 02/02/17 1111  . insulin aspart (novoLOG) injection 2-8 Units  2-8 Units Subcutaneous TID WC Laveda Abbe, NP   6 Units at 02/02/17 1209  . linagliptin (TRADJENTA) tablet 5 mg  5 mg Oral Daily Laveda Abbe, NP   5 mg at 02/02/17 1000  . loperamide (IMODIUM) capsule 2-4 mg  2-4 mg Oral PRN Laveda Abbe, NP      . magnesium hydroxide (MILK OF MAGNESIA) suspension 30 mL  30 mL Oral Daily PRN Laveda Abbe, NP      . methocarbamol (ROBAXIN) tablet 500 mg  500 mg Oral Q8H PRN Laveda Abbe, NP   500 mg at 02/02/17 1111  . naproxen (NAPROSYN) tablet 500 mg  500 mg Oral BID PRN Laveda Abbe, NP   500 mg at 01/31/17 1725  . nicotine (NICODERM CQ - dosed in mg/24 hours) patch 21 mg  21 mg Transdermal Q0600 Laveda Abbe, NP   21 mg at 02/02/17 1002  . ondansetron (ZOFRAN-ODT) disintegrating tablet 4 mg  4 mg Oral Q6H PRN Laveda Abbe, NP   4 mg at 02/02/17 1111  . QUEtiapine (SEROQUEL) tablet 200 mg  200 mg Oral QHS Laveda Abbe, NP   200 mg at 02/01/17 2147  . topiramate (TOPAMAX) tablet 50 mg  50 mg Oral BID Laveda Abbe, NP   50 mg at 02/02/17 1000  . traZODone (DESYREL) tablet 50 mg  50 mg Oral QHS PRN Laveda Abbe, NP   50 mg at 02/01/17 2147    Lab Results:  Results for orders placed or performed during the hospital encounter of 01/31/17 (from the past 48 hour(s))  Glucose, capillary     Status: Abnormal   Collection Time: 01/31/17  4:47 PM  Result Value Ref Range   Glucose-Capillary 225 (H) 65 - 99 mg/dL   Glucose, capillary     Status: Abnormal   Collection Time: 01/31/17  9:22 PM  Result Value Ref Range   Glucose-Capillary 182 (H) 65 - 99 mg/dL  Glucose, capillary     Status: Abnormal   Collection Time: 02/01/17  6:02 AM  Result Value Ref Range   Glucose-Capillary 199 (H) 65 - 99 mg/dL  Glucose, capillary  Status: Abnormal   Collection Time: 02/01/17 12:09 PM  Result Value Ref Range   Glucose-Capillary 365 (H) 65 - 99 mg/dL   Comment 1 Notify RN   Glucose, capillary     Status: Abnormal   Collection Time: 02/01/17  5:13 PM  Result Value Ref Range   Glucose-Capillary 172 (H) 65 - 99 mg/dL   Comment 1 Notify RN   Glucose, capillary     Status: Abnormal   Collection Time: 02/02/17  6:20 AM  Result Value Ref Range   Glucose-Capillary 211 (H) 65 - 99 mg/dL   Comment 1 Notify RN   Glucose, capillary     Status: Abnormal   Collection Time: 02/02/17 12:01 PM  Result Value Ref Range   Glucose-Capillary 357 (H) 65 - 99 mg/dL    Blood Alcohol level:  Lab Results  Component Value Date   ETH <5 01/31/2017   ETH <5 12/20/2016    Metabolic Disorder Labs: Lab Results  Component Value Date   HGBA1C 8.0 (H) 12/22/2016   MPG 183 12/22/2016   No results found for: PROLACTIN No results found for: CHOL, TRIG, HDL, CHOLHDL, VLDL, LDLCALC  Physical Findings: AIMS: Facial and Oral Movements Muscles of Facial Expression: None, normal Lips and Perioral Area: None, normal Jaw: None, normal Tongue: None, normal,Extremity Movements Upper (arms, wrists, hands, fingers): None, normal Lower (legs, knees, ankles, toes): None, normal, Trunk Movements Neck, shoulders, hips: None, normal, Overall Severity Severity of abnormal movements (highest score from questions above): None, normal Incapacitation due to abnormal movements: None, normal Patient's awareness of abnormal movements (rate only patient's report): No Awareness, Dental Status Current problems with teeth and/or dentures?: No Does  patient usually wear dentures?: No  CIWA:  CIWA-Ar Total: 11 COWS:  COWS Total Score: 10  Musculoskeletal: Strength & Muscle Tone: within normal limits Gait & Station: normal Patient leans: N/A  Psychiatric Specialty Exam: Physical Exam  Constitutional: He is oriented to person, place, and time. He appears well-developed and well-nourished.  HENT:  Head: Normocephalic and atraumatic.  Eyes: Conjunctivae and EOM are normal. Pupils are equal, round, and reactive to light.  Neck: Normal range of motion. Neck supple.  Cardiovascular: Normal rate and regular rhythm.   Respiratory: Effort normal and breath sounds normal.  GI: Soft. Bowel sounds are normal.  Musculoskeletal: Normal range of motion.  Neurological: He is alert and oriented to person, place, and time. He has normal reflexes.  Skin: Skin is warm and dry.  Psychiatric:  As above    ROS  Blood pressure 103/63, pulse 71, temperature 97.5 F (36.4 C), temperature source Oral, resp. rate 16, height 6' (1.829 m), weight 85.3 kg (188 lb), SpO2 100 %.Body mass index is 25.5 kg/m.  General Appearance: In bed, in distress. Not internally stimulated.   Eye Contact:  Poor  Speech:  Slow  Volume:  Decreased  Mood:  Dysphoric  Affect:  Blunted  Thought Process:  Linear  Orientation:  Full (Time, Place, and Person)  Thought Content:  Poverty of thought.   Suicidal Thoughts:  No  Homicidal Thoughts:  No  Memory:  Immediate;   Poor Recent;   Poor Remote;   Poor  Judgement:  Fair  Insight:  Fair  Psychomotor Activity:  Decreased  Concentration:  Concentration: Fair and Attention Span: Fair  Recall:  [employer] of Knowledge:  Fair  Language:  Good  Akathisia:  No  Handed:    AIMS (if indicated):     Assets:  Desire for Improvement  ADL's:  Impaired  Cognition:  Impaired,  Mild  Sleep:  Number of Hours: 6     Treatment Plan Summary: Psychiatric: SUD ( opiates and cocaine). Currently intoxicated with cocaine and  withdrawing from opiates.  Substance Induced mood disorder  Medical: DM  Cirrhosis  Diverticulitis Atherosclerosis Peripheral neuropathy.   Psychosocial:  Homelessness Unemployed Chaotic family dynamics   PLAN: 1. Continue current regimen 2. Continue to monitor mood, behavior and interaction with peers.   Georgiann Cocker, MD 02/02/2017, 3:21 PM

## 2017-02-03 DIAGNOSIS — E119 Type 2 diabetes mellitus without complications: Secondary | ICD-10-CM

## 2017-02-03 DIAGNOSIS — K7460 Unspecified cirrhosis of liver: Secondary | ICD-10-CM

## 2017-02-03 DIAGNOSIS — F17210 Nicotine dependence, cigarettes, uncomplicated: Secondary | ICD-10-CM

## 2017-02-03 DIAGNOSIS — G629 Polyneuropathy, unspecified: Secondary | ICD-10-CM

## 2017-02-03 DIAGNOSIS — I7090 Unspecified atherosclerosis: Secondary | ICD-10-CM

## 2017-02-03 LAB — GLUCOSE, CAPILLARY
GLUCOSE-CAPILLARY: 292 mg/dL — AB (ref 65–99)
GLUCOSE-CAPILLARY: 254 mg/dL — AB (ref 65–99)
GLUCOSE-CAPILLARY: 169 mg/dL — AB (ref 65–99)
Glucose-Capillary: 203 mg/dL — ABNORMAL HIGH (ref 65–99)

## 2017-02-03 MED ORDER — INSULIN ASPART 100 UNIT/ML ~~LOC~~ SOLN
0.0000 [IU] | Freq: Three times a day (TID) | SUBCUTANEOUS | Status: DC
  Administered 2017-02-03 – 2017-02-04 (×5): 8 [IU] via SUBCUTANEOUS
  Administered 2017-02-05: 3 [IU] via SUBCUTANEOUS
  Administered 2017-02-05: 8 [IU] via SUBCUTANEOUS
  Administered 2017-02-05: 3 [IU] via SUBCUTANEOUS
  Administered 2017-02-06 (×2): 5 [IU] via SUBCUTANEOUS
  Administered 2017-02-06 – 2017-02-07 (×2): 3 [IU] via SUBCUTANEOUS
  Administered 2017-02-07: 5 [IU] via SUBCUTANEOUS

## 2017-02-03 MED ORDER — TRAZODONE HCL 100 MG PO TABS
100.0000 mg | ORAL_TABLET | Freq: Every evening | ORAL | Status: DC | PRN
  Administered 2017-02-03: 100 mg via ORAL
  Filled 2017-02-03: qty 1

## 2017-02-03 MED ORDER — INSULIN ASPART 100 UNIT/ML ~~LOC~~ SOLN: 0.0000 [IU] | Freq: Three times a day (TID) | SUBCUTANEOUS | Status: DC

## 2017-02-03 MED ORDER — INSULIN ASPART 100 UNIT/ML ~~LOC~~ SOLN
0.0000 [IU] | Freq: Every day | SUBCUTANEOUS | Status: DC
  Administered 2017-02-05: 2 [IU] via SUBCUTANEOUS

## 2017-02-03 NOTE — Progress Notes (Signed)
DAR NOTE: Pt present with flat affect and depressed mood in the unit. Pt has been isolating himself and has been bed most of the am. Pt complained of feeling dizzy and withdrawal symptoms, and left lower abdomen pain. Pt took all his meds as scheduled. As per self inventory, pt had a good night sleep, good appetite, normal energy, and good concentration. Pt rate depression at 6, hopeless ness at 3, and anxiety 9. Pt's safety ensured with 15 minute and environmental checks. Pt currently denies SI/HI and A/V hallucinations. Pt verbally agrees to seek staff if SI/HI or A/VH occurs and to consult with staff before acting on these thoughts. Will continue POC.

## 2017-02-03 NOTE — Progress Notes (Signed)
Nursing Progress Note 7p-7a  D) Patient presents anxious but is pleasant and cooperative. Patient states that his withdrawal symptoms are "improving". Patient complains of constipation and states his last bowel movement was 3/31. No distention noted. Patient is seen interactive in the milieu. Patient denies SI/HI/AVH or pain. Patient contracts for safety on the unit. Current CIWA = 1. CBG 169.  A) Emotional support given. 1:1 interaction and active listening provided. Patient medicated with PM orders as prescribed. Medications reviewed with patient. Patient verbalized understanding of medications without further questions. No insulin coverage needed this evening according to MD order. Snacks and fluids provided. Opportunities for questions or concerns presented to patient. Patient encouraged to continue to work on treatment goals. Labs, vital signs, CBG and patient behavior monitored throughout shift. Patient safety maintained with q15 min safety checks.  R) Patient receptive to interaction with nurse. Patient remains safe on the unit at this time. Patient denies any adverse medication reactions at this time. Patient is resting in bed without complaints. Will continue to monitor.

## 2017-02-03 NOTE — [facility] Group Notes (Signed)
[facility] Group Notes:  (Nursing/MHT/Case Management/Adjunct)  Date:  02/03/2017  Time:  1:17 PM  Type of Therapy:  Psychoeducational Skills  Participation Level:  Did Not Attend  Participation Quality:  DID NOT ATTEND  Affect:  DID NOT ATTEND  Cognitive:  DID NOT ATTEND  Insight:  None  Engagement in Group:  DID NOT ATTEND  Modes of Intervention:  DID NOT ATTEND  Summary of Progress/Problems: Pt did not attend patient self inventory group.    Bethann Punches 02/03/2017, 1:17 PM

## 2017-02-03 NOTE — Progress Notes (Signed)
D: Patient pleasant and cooperative with care this shift and is noted to interact well with peers in the milieu, but he does endorse depression and anxiety at times. A: Encourage staff/peer interaction, medication compliance, and group participation. Administer medications as ordered, maintain Q 15 minute safety checks. R: Pt compliant with medications and attended group session. Pt denies SI at this time and verbally contracts for safety. No signs/symptoms of distress noted.

## 2017-02-03 NOTE — Progress Notes (Signed)
[facility] MD Progress Note  02/03/2017 11:19 AM Chase Reed  MRN:  [id number] Subjective:  "I feel like my sugar is high." Objective:   51 yo Caucasian male with history of Substance Use Disorder and Mood Disorder. Recently discharged from our unit and returned to  ER seeking detox.  He reports thath he is back here because of detox from opiates.  He reported trying to get in to an [facility] but they could not find room when he left here and he relapsed.    Patient was feeling dizzy, a blood glucose fingerstick was checked and patient was hyperglycemic, covered with ss insulin.  Will continue to monitor.  Patient also reported poor sleep.  Seen today. Still in withdrawals. No hallucinations. No suicidal thoughts.   Principal Problem: Bipolar I disorder, most recent episode depressed (HCC) Diagnosis:   Patient Active Problem List   Diagnosis Date Noted  . Bipolar I disorder, most recent episode depressed (HCC) [F31.30] 01/31/2017  . Bipolar 1 disorder, depressed, severe (HCC) [F31.4] 01/31/2017  . Opiate withdrawal (HCC) [F11.23] 12/21/2016  . Tinea cruris [B35.6] 12/21/2016  . Substance induced mood disorder (HCC) [F19.94] 12/21/2016  . 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 Elevated lipase [R74.8] 05/02/2016   Total Time spent with patient: 30 minutes  Past Psychiatric History: As in H&P  Past Medical History:  Past Medical History:  Diagnosis Date  . Cirrhosis (HCC)   . Diabetes mellitus without complication (HCC)   . Hepatitis     Past Surgical History:  Procedure Laterality Date  . LITHOTRIPSY     Family History: History reviewed. No pertinent family history. Family Psychiatric  History: As in H&P Social History:  History  Alcohol Use  . Yes    Comment: rarely      History  Drug Use  . Types: Heroin    Social History    Social History  . Marital status: Married    Spouse name: N/A  . Number of children: N/A  . Years of education: N/A   Social History Main Topics  . Smoking status: Current Every Day Smoker    Packs/day: 0.50    Years: 30.00  . Smokeless tobacco: Former Neurosurgeon  . Alcohol use Yes     Comment: rarely   . Drug use: Yes    Types: Heroin  . Sexual activity: No   Other Topics Concern  . None   Social History Narrative  . None   Additional Social History:    History of alcohol / drug use?: Yes Longest period of sobriety (when/how long): On Methadone for 12 years before 9 months ago Negative Consequences of Use: Financial, Personal relationships, Legal Withdrawal Symptoms: Tremors, Diarrhea, Fever / Chills Name of Substance 1: Heroin 1 - Age of First Use: unknown 1 - Amount (size/oz): unspecified (IV use)  1 - Frequency: daily 1 - Duration: 9 months  1 - Last Use / Amount: yesterday at 10p       Sleep: Good  Appetite:  Good  Current Medications: Current Facility-Administered Medications  Medication Dose Route Frequency Provider Last Rate Last Dose  . acetaminophen (TYLENOL) tablet 650 mg  650 mg Oral Q6H PRN Laveda Abbe, NP      . alum & mag hydroxide-simeth (MAALOX/MYLANTA) 200-200-20 MG/5ML suspension 30 mL  30 mL Oral Q4H PRN Laveda Abbe, NP      . atorvastatin (  LIPITOR) tablet 10 mg  10 mg Oral q1800 Laveda Abbe, NP   10 mg at 02/02/17 1726  . buPROPion (WELLBUTRIN XL) 24 hr tablet 150 mg  150 mg Oral Daily Laveda Abbe, NP   150 mg at 02/03/17 0837  . cloNIDine (CATAPRES) tablet 0.1 mg  0.1 mg Oral BH-qamhs Laveda Abbe, NP   0.1 mg at 02/03/17 1610   Followed by  . [START ON 02/05/2017] cloNIDine (CATAPRES) tablet 0.1 mg  0.1 mg Oral QAC breakfast Laveda Abbe, NP      . dicyclomine (BENTYL) tablet 20 mg  20 mg Oral Q6H PRN Laveda Abbe, NP   20 mg at 02/02/17 1111  . feeding supplement (ENSURE ENLIVE)  (ENSURE ENLIVE) liquid 237 mL  237 mL Oral BID PRN Nelly Rout, MD   237 mL at 02/02/17 1001  . gabapentin (NEURONTIN) capsule 300 mg  300 mg Oral TID Laveda Abbe, NP   300 mg at 02/03/17 0836  . hydrOXYzine (ATARAX/VISTARIL) tablet 25 mg  25 mg Oral Q6H PRN Laveda Abbe, NP   25 mg at 02/02/17 2109  . insulin aspart (novoLOG) injection 0-15 Units  0-15 Units Subcutaneous TID WC Adonis Brook, NP      . insulin aspart (novoLOG) injection 0-5 Units  0-5 Units Subcutaneous QHS Adonis Brook, NP      . linagliptin (TRADJENTA) tablet 5 mg  5 mg Oral Daily Laveda Abbe, NP   5 mg at 02/03/17 0837  . loperamide (IMODIUM) capsule 2-4 mg  2-4 mg Oral PRN Laveda Abbe, NP      . magnesium hydroxide (MILK OF MAGNESIA) suspension 30 mL  30 mL Oral Daily PRN Laveda Abbe, NP      . methocarbamol (ROBAXIN) tablet 500 mg  500 mg Oral Q8H PRN Laveda Abbe, NP   500 mg at 02/02/17 2109  . naproxen (NAPROSYN) tablet 500 mg  500 mg Oral BID PRN Laveda Abbe, NP   500 mg at 01/31/17 1725  . nicotine (NICODERM CQ - dosed in mg/24 hours) patch 21 mg  21 mg Transdermal Q0600 Laveda Abbe, NP   21 mg at 02/03/17 0839  . ondansetron (ZOFRAN-ODT) disintegrating tablet 4 mg  4 mg Oral Q6H PRN Laveda Abbe, NP   4 mg at 02/02/17 1111  . QUEtiapine (SEROQUEL) tablet 200 mg  200 mg Oral QHS Laveda Abbe, NP   200 mg at 02/02/17 2110  . topiramate (TOPAMAX) tablet 50 mg  50 mg Oral BID Laveda Abbe, NP   50 mg at 02/03/17 0837  . traZODone (DESYREL) tablet 100 mg  100 mg Oral QHS PRN Adonis Brook, NP        Lab Results:  Results for orders placed or performed during the hospital encounter of 01/31/17 (from the past 48 hour(s))  Glucose, capillary     Status: Abnormal   Collection Time: 02/01/17 12:09 PM  Result Value Ref Range   Glucose-Capillary 365 (H) 65 - 99 mg/dL   Comment 1 Notify RN   Glucose, capillary     Status:  Abnormal   Collection Time: 02/01/17  5:13 PM  Result Value Ref Range   Glucose-Capillary 172 (H) 65 - 99 mg/dL   Comment 1 Notify RN   Glucose, capillary     Status: Abnormal   Collection Time: 02/02/17  6:20 AM  Result Value Ref Range   Glucose-Capillary 211 (H) 65 -  99 mg/dL   Comment 1 Notify RN   Glucose, capillary     Status: Abnormal   Collection Time: 02/02/17 12:01 PM  Result Value Ref Range   Glucose-Capillary 357 (H) 65 - 99 mg/dL  Glucose, capillary     Status: Abnormal   Collection Time: 02/02/17  5:15 PM  Result Value Ref Range   Glucose-Capillary 188 (H) 65 - 99 mg/dL  Glucose, capillary     Status: Abnormal   Collection Time: 02/02/17  9:09 PM  Result Value Ref Range   Glucose-Capillary 235 (H) 65 - 99 mg/dL   Comment 1 Notify RN   Glucose, capillary     Status: Abnormal   Collection Time: 02/03/17  6:03 AM  Result Value Ref Range   Glucose-Capillary 203 (H) 65 - 99 mg/dL  Glucose, capillary     Status: Abnormal   Collection Time: 02/03/17 10:48 AM  Result Value Ref Range   Glucose-Capillary 292 (H) 65 - 99 mg/dL    Blood Alcohol level:  Lab Results  Component Value Date   ETH <5 01/31/2017   ETH <5 12/20/2016    Metabolic Disorder Labs: Lab Results  Component Value Date   HGBA1C 8.0 (H) 12/22/2016   MPG 183 12/22/2016   No results found for: PROLACTIN No results found for: CHOL, TRIG, HDL, CHOLHDL, VLDL, LDLCALC  Physical Findings: AIMS: Facial and Oral Movements Muscles of Facial Expression: None, normal Lips and Perioral Area: None, normal Jaw: None, normal Tongue: None, normal,Extremity Movements Upper (arms, wrists, hands, fingers): None, normal Lower (legs, knees, ankles, toes): None, normal, Trunk Movements Neck, shoulders, hips: None, normal, Overall Severity Severity of abnormal movements (highest score from questions above): None, normal Incapacitation due to abnormal movements: None, normal Patient's awareness of abnormal  movements (rate only patient's report): No Awareness, Dental Status Current problems with teeth and/or dentures?: No Does patient usually wear dentures?: No  CIWA:  CIWA-Ar Total: 11 COWS:  COWS Total Score: 6  Musculoskeletal: Strength & Muscle Tone: within normal limits Gait & Station: normal Patient leans: N/A  Psychiatric Specialty Exam: Physical Exam  Nursing note and vitals reviewed. Constitutional: He is oriented to person, place, and time. He appears well-developed and well-nourished.  HENT:  Head: Normocephalic and atraumatic.  Eyes: Conjunctivae and EOM are normal. Pupils are equal, round, and reactive to light.  Neck: Normal range of motion. Neck supple.  Cardiovascular: Normal rate and regular rhythm.   Respiratory: Effort normal and breath sounds normal.  GI: Soft. Bowel sounds are normal.  Musculoskeletal: Normal range of motion.  Neurological: He is alert and oriented to person, place, and time. He has normal reflexes.  Skin: Skin is warm and dry.  Psychiatric:  As above    Review of Systems  All other systems reviewed and are negative.   Blood pressure 113/70, pulse (!) 59, temperature 98.4 F (36.9 C), temperature source Oral, resp. rate 16, height 6' (1.829 m), weight 85.3 kg (188 lb), SpO2 100 %.Body mass index is 25.5 kg/m.  General Appearance: In bed, in distress. Not internally stimulated.   Eye Contact:  Poor  Speech:  Slow  Volume:  Decreased  Mood:  Dysphoric  Affect:  Blunted  Thought Process:  Linear  Orientation:  Full (Time, Place, and Person)  Thought Content:  Poverty of thought.   Suicidal Thoughts:  No  Homicidal Thoughts:  No  Memory:  Immediate;   Poor Recent;   Poor Remote;   Poor  Judgement:  Fair  Insight:  Fair  Psychomotor Activity:  Decreased  Concentration:  Concentration: Fair and Attention Span: Fair  Recall:  [employer] of Knowledge:  Fair  Language:  Good  Akathisia:  No  Handed:    AIMS (if indicated):      Assets:  Desire for Improvement  ADL's:  Impaired  Cognition:  Impaired,  Mild  Sleep:  Number of Hours: 6.75   Treatment Plan Summary: Psychiatric: SUD ( opiates and cocaine). Currently intoxicated with cocaine and withdrawing from opiates.  Substance Induced mood disorder  Medical: DM  Cirrhosis  Diverticulitis Atherosclerosis Peripheral neuropathy.   Psychosocial:  Homelessness Unemployed Chaotic family dynamics   PLAN: 1. Continue current regimen.  Clonidine protocol, Increased Trazodone to 100 mg bedtiem insomnia, cont Seroquel 200 mg for mood stabilization.   2. Continue to monitor mood, behavior and interaction with peers.   Lindwood Qua, NP [facility] 02/03/2017, 11:19 AM   Agree with NP Progress Note

## 2017-02-03 NOTE — Plan of Care (Signed)
Problem: Activity: Goal: Interest or engagement in activities will improve Outcome: Progressing Patient is interactive in the milieu, patient is attending groups and assertive with staff with his needs.

## 2017-02-03 NOTE — [facility] Group Notes (Signed)
[facility] LCSW Group Therapy  02/03/2017 1:49 PM  Type of Therapy:  Group Therapy  Participation Level:  Active  Participation Quality:  Attentive  Affect:  Appropriate  Cognitive:  Alert and Oriented  Insight:  Improving  Engagement in Therapy:  Improving  Modes of Intervention:  Discussion, Education, Exploration, Socialization and Support  Summary of Progress/Problems: [facility] Speaker came to talk about his personal journey with substance abuse and addiction. The pt processed ways by which to relate to the speaker. [facility] speaker provided handouts and educational information pertaining to groups and services offered by the [facility].   Shyheim Tanney N Smart LCSW 02/03/2017, 1:49 PM

## 2017-02-03 NOTE — Progress Notes (Signed)
Adult Psychoeducational Group Note  Date:  02/03/2017 Time:  1:43 AM  Group Topic/Focus:  Wrap-Up Group:   The focus of this group is to help patients review their daily goal of treatment and discuss progress on daily workbooks.  Participation Level:  Active  Participation Quality:  Appropriate  Affect:  Appropriate  Cognitive:  Appropriate  Insight: Good  Engagement in Group:  Developing/Improving  Modes of Intervention:  Discussion, Socialization and Support  Additional Comments:  Pt attended wrap up group. Pt stated his goal was to get up and be active in his treatment. Stated he was able to get up and attend meals and talk with peers which helped his day.  Karleen Hampshire Brittini 02/03/2017, 1:43 AM

## 2017-02-04 LAB — GLUCOSE, CAPILLARY
GLUCOSE-CAPILLARY: 272 mg/dL — AB (ref 65–99)
GLUCOSE-CAPILLARY: 253 mg/dL — AB (ref 65–99)
GLUCOSE-CAPILLARY: 296 mg/dL — AB (ref 65–99)
GLUCOSE-CAPILLARY: 187 mg/dL — AB (ref 65–99)

## 2017-02-04 MED ORDER — MIRTAZAPINE 7.5 MG PO TABS
7.5000 mg | ORAL_TABLET | Freq: Every day | ORAL | Status: DC
  Administered 2017-02-04 – 2017-02-06 (×3): 7.5 mg via ORAL
  Filled 2017-02-04 (×6): qty 1

## 2017-02-04 NOTE — Progress Notes (Signed)
D: Patient denies SI/HI and A/V hallucinations; patient reports that he slept well; patient reports that he is glad to be off of the seroquel  A: Monitored q 15 minutes; patient encouraged to attend groups; patient educated about medications; patient given medications per physician orders; patient encouraged to express feelings and/or concerns  R: Patient is cooperative but underlying irritable; patient is flat and blunted; patient has attended groups; patient is appropriate with his peers

## 2017-02-04 NOTE — Progress Notes (Signed)
Patient CBG 272 this morning. Per MD order, Patient to receive 8 units of insulin novolog. Writer brought patient his dose of insulin and reviewed the regimen with patient. Patient reluctant to take the dose stating, "8 units of insulin is what I take when my blood sugar is 400". Patient educated about sliding scale dosing. Patient stated "my normal blood sugar runs 170-180". Patient educated about therapeutic blood sugar levels being between 90-120. Patient stated "if my blood sugar is 120 I start to have a headache". Patient educated about the effects of prolonged elevated blood sugars including neuropathy. Patient stated "I have neuropathy". Writer reviewed with patient his recent CBG readings ranging from 160-370. Patient educated that he had the right to refuse his medication. Patient verbalized understanding. Patient educated about benefits of taking insulin. Patient agreed to take medication. Patient encouraged to speak with provider about changes in medication dosing. Signs and symptoms of hypoglycemia reviewed with patient. Patient verbalized understanding.

## 2017-02-04 NOTE — Progress Notes (Signed)
Recreation Therapy Notes  Date: 02/04/17 Time: 0930 Location: 300 Hall Dayroom  Group Topic: Stress Management  Goal Area(s) Addresses:  Patient will verbalize importance of using healthy stress management.  Patient will identify positive emotions associated with healthy stress management.   Intervention: Stress Management  Activity :  Progressive Muscle Relaxation.  LRT introduced the stress management technique of progressive muscle relaxation.  LRT read Reed script to allow patients to tense and relax each muscle group individually.  Patients were to follow along as LRT read script to engage in activity.  Education:  Stress Management, Discharge Planning.   Education Outcome: Acknowledges edcuation/In group clarification offered/Needs additional education  Clinical Observations/Feedback: Pt did not attend group.   Chase Reed, LRT/CTRS         Chase Reed 02/04/2017 12:16 PM 

## 2017-02-04 NOTE — Progress Notes (Addendum)
[facility] MD Progress Note  02/04/2017 4:32 PM Chase Reed  MRN:  [id number] Subjective:   Patient reports that he is feeling better than on admission, and feels his opiate withdrawal symptoms have improved/subsided. He reports his mood is partially improved as well. Reports chronic pain, mainly related to peripheral neuropathy. Patient has long history of DM. Patient reports he has been on Wellbutrin for a long period of time , but unsure if it is helping . Objective:   51 year old male, history of Opiate Dependence, history of mood disorder, improving from significant opiate withdrawal symptoms earlier on this admission. Recent prior admission , relapsed after discharge. As above, at this time endorsing improving symptoms of opiate WDL and not presenting in any acute distress of discomfort . Denies vomiting or diarrhea, and is tolerating PO well . Of note, patient has been on Wellbutrin with partial response- we discussed options- patient reports a history of several seizures in the past ( none recent, last time was years ago) which he states were grand mal and likely associated to cocaine and /or withdrawal.  He reports fair sleep- recently started on Seroquel. Gradually becoming more visible on unit , starting to be more active in groups. Patient is interested in going to a rehab after discharge, as states that risk of relapse is " pretty high if I go back out right now ".   Principal Problem: Bipolar I disorder, most recent episode depressed (HCC) Diagnosis:   Patient Active Problem List   Diagnosis Date Noted  . Bipolar I disorder, most recent episode depressed (HCC) [F31.30] 01/31/2017  . Bipolar 1 disorder, depressed, severe (HCC) [F31.4] 01/31/2017  . Opiate withdrawal (HCC) [F11.23] 12/21/2016  . Tinea cruris [B35.6] 12/21/2016  . Substance induced mood disorder (HCC) [F19.94] 12/21/2016  . Polysubstance (including opioids) dependence w/o physiol dependence (HCC) [F19.20] 05/02/2016   . 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 Elevated lipase [R74.8] 05/02/2016   Total Time spent with patient: 20 minutes  Past Psychiatric History: As in H&P  Past Medical History:  Past Medical History:  Diagnosis Date  . Cirrhosis (HCC)   . Diabetes mellitus without complication (HCC)   . Hepatitis     Past Surgical History:  Procedure Laterality Date  . LITHOTRIPSY     Family History: History reviewed. No pertinent family history. Family Psychiatric  History: As in H&P Social History:  History  Alcohol Use  . Yes    Comment: rarely      History  Drug Use  . Types: Heroin    Social History   Social History  . Marital status: Married    Spouse name: N/A  . Number of children: N/A  . Years of education: N/A   Social History Main Topics  . Smoking status: Current Every Day Smoker    Packs/day: 0.50    Years: 30.00  . Smokeless tobacco: Former Neurosurgeon  . Alcohol use Yes     Comment: rarely   . Drug use: Yes    Types: Heroin  . Sexual activity: No   Other Topics Concern  . None   Social History Narrative  . None   Additional Social History:    History of alcohol / drug use?: Yes Longest period of sobriety (when/how long): On Methadone for 12 years before 9 months ago Negative Consequences of Use: Financial, Personal relationships, Legal Withdrawal Symptoms: Tremors, Diarrhea, Fever / Chills Name of Substance 1: Heroin 1 - Age of First  Use: unknown 1 - Amount (size/oz): unspecified (IV use)  1 - Frequency: daily 1 - Duration: 9 months  1 - Last Use / Amount: yesterday at 10p       Sleep: fair- improving   Appetite:  improving   Current Medications: Current Facility-Administered Medications  Medication Dose Route Frequency Provider Last Rate Last Dose  . acetaminophen (TYLENOL) tablet 650 mg  650 mg Oral Q6H PRN Laveda Abbe, NP      . alum & mag hydroxide-simeth (MAALOX/MYLANTA) 200-200-20  MG/5ML suspension 30 mL  30 mL Oral Q4H PRN Laveda Abbe, NP      . atorvastatin (LIPITOR) tablet 10 mg  10 mg Oral q1800 Laveda Abbe, NP   10 mg at 02/03/17 1743  . [START ON 02/05/2017] cloNIDine (CATAPRES) tablet 0.1 mg  0.1 mg Oral QAC breakfast Laveda Abbe, NP      . dicyclomine (BENTYL) tablet 20 mg  20 mg Oral Q6H PRN Laveda Abbe, NP   20 mg at 02/02/17 1111  . feeding supplement (ENSURE ENLIVE) (ENSURE ENLIVE) liquid 237 mL  237 mL Oral BID PRN Nelly Rout, MD   237 mL at 02/02/17 1001  . gabapentin (NEURONTIN) capsule 300 mg  300 mg Oral TID Laveda Abbe, NP   300 mg at 02/04/17 1216  . hydrOXYzine (ATARAX/VISTARIL) tablet 25 mg  25 mg Oral Q6H PRN Laveda Abbe, NP   25 mg at 02/03/17 2129  . insulin aspart (novoLOG) injection 0-15 Units  0-15 Units Subcutaneous TID WC Adonis Brook, NP   8 Units at 02/04/17 1217  . insulin aspart (novoLOG) injection 0-5 Units  0-5 Units Subcutaneous QHS Adonis Brook, NP      . linagliptin (TRADJENTA) tablet 5 mg  5 mg Oral Daily Laveda Abbe, NP   5 mg at 02/04/17 0840  . loperamide (IMODIUM) capsule 2-4 mg  2-4 mg Oral PRN Laveda Abbe, NP      . magnesium hydroxide (MILK OF MAGNESIA) suspension 30 mL  30 mL Oral Daily PRN Laveda Abbe, NP   30 mL at 02/03/17 2130  . methocarbamol (ROBAXIN) tablet 500 mg  500 mg Oral Q8H PRN Laveda Abbe, NP   500 mg at 02/02/17 2109  . mirtazapine (REMERON) tablet 7.5 mg  7.5 mg Oral QHS Airiel Oblinger A Justinian Miano, MD      . naproxen (NAPROSYN) tablet 500 mg  500 mg Oral BID PRN Laveda Abbe, NP   500 mg at 01/31/17 1725  . nicotine (NICODERM CQ - dosed in mg/24 hours) patch 21 mg  21 mg Transdermal Q0600 Laveda Abbe, NP   21 mg at 02/04/17 0840  . ondansetron (ZOFRAN-ODT) disintegrating tablet 4 mg  4 mg Oral Q6H PRN Laveda Abbe, NP   4 mg at 02/02/17 1111  . topiramate (TOPAMAX) tablet 50 mg  50 mg Oral BID Laveda Abbe, NP   50 mg at 02/04/17 0840    Lab Results:  Results for orders placed or performed during the hospital encounter of 01/31/17 (from the past 48 hour(s))  Glucose, capillary     Status: Abnormal   Collection Time: 02/02/17  5:15 PM  Result Value Ref Range   Glucose-Capillary 188 (H) 65 - 99 mg/dL  Glucose, capillary     Status: Abnormal   Collection Time: 02/02/17  9:09 PM  Result Value Ref Range   Glucose-Capillary 235 (H) 65 - 99 mg/dL  Comment 1 Notify RN   Glucose, capillary     Status: Abnormal   Collection Time: 02/03/17  6:03 AM  Result Value Ref Range   Glucose-Capillary 203 (H) 65 - 99 mg/dL  Glucose, capillary     Status: Abnormal   Collection Time: 02/03/17 10:48 AM  Result Value Ref Range   Glucose-Capillary 292 (H) 65 - 99 mg/dL  Glucose, capillary     Status: Abnormal   Collection Time: 02/03/17  5:08 PM  Result Value Ref Range   Glucose-Capillary 254 (H) 65 - 99 mg/dL  Glucose, capillary     Status: Abnormal   Collection Time: 02/03/17  9:16 PM  Result Value Ref Range   Glucose-Capillary 169 (H) 65 - 99 mg/dL  Glucose, capillary     Status: Abnormal   Collection Time: 02/04/17  6:22 AM  Result Value Ref Range   Glucose-Capillary 272 (H) 65 - 99 mg/dL  Glucose, capillary     Status: Abnormal   Collection Time: 02/04/17 12:08 PM  Result Value Ref Range   Glucose-Capillary 253 (H) 65 - 99 mg/dL    Blood Alcohol level:  Lab Results  Component Value Date   ETH <5 01/31/2017   ETH <5 12/20/2016    Metabolic Disorder Labs: Lab Results  Component Value Date   HGBA1C 8.0 (H) 12/22/2016   MPG 183 12/22/2016   No results found for: PROLACTIN No results found for: CHOL, TRIG, HDL, CHOLHDL, VLDL, LDLCALC  Physical Findings: AIMS: Facial and Oral Movements Muscles of Facial Expression: None, normal Lips and Perioral Area: None, normal Jaw: None, normal Tongue: None, normal,Extremity Movements Upper (arms, wrists, hands, fingers): None,  normal Lower (legs, knees, ankles, toes): None, normal, Trunk Movements Neck, shoulders, hips: None, normal, Overall Severity Severity of abnormal movements (highest score from questions above): None, normal Incapacitation due to abnormal movements: None, normal Patient's awareness of abnormal movements (rate only patient's report): No Awareness, Dental Status Current problems with teeth and/or dentures?: No Does patient usually wear dentures?: No  CIWA:  CIWA-Ar Total: 11 COWS:  COWS Total Score: 1  Musculoskeletal: Strength & Muscle Tone: within normal limits Gait & Station: normal Patient leans: N/A  Psychiatric Specialty Exam: Physical Exam  Nursing note and vitals reviewed. Constitutional: He is oriented to person, place, and time. He appears well-developed and well-nourished.  HENT:  Head: Normocephalic and atraumatic.  Eyes: Conjunctivae and EOM are normal. Pupils are equal, round, and reactive to light.  Neck: Normal range of motion. Neck supple.  Cardiovascular: Normal rate and regular rhythm.   Respiratory: Effort normal and breath sounds normal.  GI: Soft. Bowel sounds are normal.  Musculoskeletal: Normal range of motion.  Neurological: He is alert and oriented to person, place, and time. He has normal reflexes.  Skin: Skin is warm and dry.  Psychiatric:  As above    Review of Systems  All other systems reviewed and are negative. denies vomiting or diarrhea at this time. No fever or chills, chronic lower extremity symptoms related to peripheral neuropathy. Denies any melenas or any bleeding   Blood pressure 118/81, pulse 61, temperature 98.3 F (36.8 C), temperature source Oral, resp. rate 16, height 6' (1.829 m), weight 85.3 kg (188 lb), SpO2 100 %.Body mass index is 25.5 kg/m.  General Appearance: improved grooming    Eye Contact:  Good  Speech:  Normal Rate  Volume:  Normal  Mood:  reports mood is improving   Affect:  more reactive ed  Thought Process:  Goal Directed, Linear and Descriptions of Associations: Intact  Orientation:  Other:  fully alert and attentive   Thought Content:  Poverty of thought.   Suicidal Thoughts:  No denies any suicidal or self injurious ideations   Homicidal Thoughts:  No denies any homicidal or violent ideations  Memory:   Recent and remote grossly intact   Judgement:  Other:  improving  Insight:  improving  Psychomotor Activity:  Normal  Concentration:  Concentration: Good and Attention Span: Good  Recall:  Good  Fund of Knowledge:  Good  Language:  Good  Akathisia:  No  Handed:  right  AIMS (if indicated):     Assets:  Communication Skills Desire for Improvement Resilience  ADL's:  Improving   Cognition:  WNL  Sleep:  Number of Hours: 6.75    Assessment - Opiate WDL symptoms are abating, and patient is feeling better. Denies suicidal ideations. He is future oriented and interested in going to a residential, rehab setting at discharge.  Although denies any side effects on Wellbutrin , I am concerned about titrating further due to history of seizures in the past. We discussed Cymbalta as an option as well, but patient has history of thrombocytopenia ( chronic, states etiology not clear) , so would avoid medication that can contribute to platelet dysfunction. Agrees to Remeron for depression, insomnia, anxiety Treatment Plan Summary:  PLAN: Encourage ongoing group and milieu participation to work on Pharmacologist and symptom reduction D/C Seroquel due to potential for negative impact on underlying DM D/C Trazodone  D/C Wellbutrin due to concern regarding reported history of seizures in the past  Start Remeron 7.5 mgrs QHS for depression, insomnia Treatment team working on disposition planning, patient interested in going to Rehab   Repeat CBC in AM    Craige Cotta, MD  02/04/2017, 4:32 PM   Patient ID: Chase Reed, male   DOB: 06-03-1966, 51 y.o.   MRN: [id number]

## 2017-02-04 NOTE — Progress Notes (Signed)
Adult Psychoeducational Group Note  Date:  02/04/2017 Time:  2:24 AM  Group Topic/Focus:  Wrap-Up Group:   The focus of this group is to help patients review their daily goal of treatment and discuss progress on daily workbooks.  Participation Level:  Did Not Attend  Additional Comments:  Pt did not attend group. Pt remained in bed.  Karleen Hampshire Brittini 02/04/2017, 2:24 AM

## 2017-02-04 NOTE — Progress Notes (Signed)
CSW contacted [facility] admissions this afternoon. Pt completed phone screening this morning but there are no beds available. CSW will contact admissions in the morning.  Trula Slade, MSW, LCSW Clinical Social Worker 02/04/2017 3:21 PM

## 2017-02-04 NOTE — [facility] Group Notes (Signed)
[facility] LCSW Group Therapy  02/04/2017 3:20 PM  Type of Therapy:  Group Therapy  Participation Level:  Did Not Attend-pt invited. Chose to remain in bed.   Summary of Progress/Problems:  Finding Balance in Life. Today's group focused on defining balance in one's own words, identifying things that can knock one off balance, and exploring healthy ways to maintain balance in life. Group members were asked to provide an example of a time when they felt off balance, describe how they handled that situation,and process healthier ways to regain balance in the future. Group members were asked to share the most important tool for maintaining balance that they learned while at [facility] and how they plan to apply this method after discharge.   Mayjor Ager N Smart LCSW 02/04/2017, 3:20 PM

## 2017-02-04 NOTE — Progress Notes (Signed)
D: Patient complained of constipation stated "the last time I went was on Saturday, had little today but I know that its pack in there. I need it to get rid of it". Accepted Milk of Mag ordered. CBG at bedtime was 187 mg/dl. No coverage per sliding scale. Denies pain, SI/HI, AH/VH at this time. Endorses mild depression. Remains visible on dayroom. Cooperative and pleasant on unit. No behavioral issues noted. A: Staff offered support and encouragement as needed. Due med given as ordered. Routine safety checks maintained. Will continue to monitor patient. R: Patient remains safe

## 2017-02-05 LAB — CBC WITH DIFFERENTIAL/PLATELET
BASOS ABS: 0 10*3/uL (ref 0.0–0.1)
Basophils Relative: 0 %
EOS ABS: 0.1 10*3/uL (ref 0.0–0.7)
EOS PCT: 2 %
HCT: 42.9 % (ref 39.0–52.0)
Hemoglobin: 14.9 g/dL (ref 13.0–17.0)
Lymphocytes Relative: 26 %
Lymphs Abs: 1.4 10*3/uL (ref 0.7–4.0)
MCH: 30.5 pg (ref 26.0–34.0)
MCHC: 34.7 g/dL (ref 30.0–36.0)
MCV: 87.7 fL (ref 78.0–100.0)
MONO ABS: 0.5 10*3/uL (ref 0.1–1.0)
Monocytes Relative: 10 %
Neutro Abs: 3.2 10*3/uL (ref 1.7–7.7)
Neutrophils Relative %: 61 %
PLATELETS: 66 10*3/uL — AB (ref 150–400)
RBC: 4.89 MIL/uL (ref 4.22–5.81)
RDW: 14 % (ref 11.5–15.5)
WBC: 5.2 10*3/uL (ref 4.0–10.5)

## 2017-02-05 LAB — GLUCOSE, CAPILLARY
GLUCOSE-CAPILLARY: 198 mg/dL — AB (ref 65–99)
GLUCOSE-CAPILLARY: 280 mg/dL — AB (ref 65–99)
GLUCOSE-CAPILLARY: 191 mg/dL — AB (ref 65–99)
Glucose-Capillary: 225 mg/dL — ABNORMAL HIGH (ref 65–99)

## 2017-02-05 NOTE — Progress Notes (Addendum)
[facility] MD Progress Note  02/05/2017 12:05 PM LEV CERVONE  MRN:  [id number] Subjective:   Patient states he has been feeling better overall , but states that he has continued to have episodes of feeling significantly anxious, " jittery". He is unsure if this is related to residual WDL symptoms or if it is related to having increased anxiety and emotions now that he is sober and is starting to " feel more "  Objective:   I have discussed case with treatment team and have met with patient. Overall patient is improved compared to admission and symptoms of Opiate WDL have abated . He reports ongoing intermittent anxiety and feeling labile, emotional. Denies suicidal ideations. He is future oriented, and expresses a desire to go to a rehab setting after discharge , as he feels he is still  at high risk of relapse if not in a supervised residential setting  No disruptive or agitated behaviors on unit Does not endorse medication side effects CBC reviewed- WBC normalized. PLT count trending up ( 66 K)   Principal Problem: Bipolar I disorder, most recent episode depressed ([place]) Diagnosis:   Patient Active Problem List   Diagnosis Date Noted  . Bipolar I disorder, most recent episode depressed ([place]) [F31.30] 01/31/2017  . Bipolar 1 disorder, depressed, severe ([place]) [F31.4] 01/31/2017  . Opiate withdrawal ([place]) [F11.23] 12/21/2016  . Tinea cruris [B35.6] 12/21/2016  . Substance induced mood disorder ([place]) [F19.94] 12/21/2016  . Polysubstance (including opioids) dependence w/o physiol dependence ([place]) [F19.20] 05/02/2016  . Substance-induced psychotic disorder with hallucinations ([place]) [F19.951] 05/02/2016  . Elevated lipase [R74.8] 05/02/2016   Total Time spent with patient: 20 minutes  Past Psychiatric History: As in H&P  Past Medical History:  Past Medical History:  Diagnosis Date  . Cirrhosis ([place])   . Diabetes mellitus without complication ([place])   . Hepatitis     Past Surgical History:   Procedure Laterality Date  . LITHOTRIPSY     Family History: History reviewed. No pertinent family history. Family Psychiatric  History: As in H&P Social History:  History  Alcohol Use  . Yes    Comment: rarely      History  Drug Use  . Types: Heroin    Social History   Social History  . Marital status: Married    Spouse name: N/A  . Number of children: N/A  . Years of education: N/A   Social History Main Topics  . Smoking status: Current Every Day Smoker    Packs/day: 0.50    Years: 30.00  . Smokeless tobacco: Former Systems developer  . Alcohol use Yes     Comment: rarely   . Drug use: Yes    Types: Heroin  . Sexual activity: No   Other Topics Concern  . None   Social History Narrative  . None   Additional Social History:    History of alcohol / drug use?: Yes Longest period of sobriety (when/how long): On Methadone for 12 years before 9 months ago Negative Consequences of Use: Financial, Personal relationships, Legal Withdrawal Symptoms: Tremors, Diarrhea, Fever / Chills Name of Substance 1: Heroin 1 - Age of First Use: unknown 1 - Amount (size/oz): unspecified (IV use)  1 - Frequency: daily 1 - Duration: 9 months  1 - Last Use / Amount: yesterday at 10p       Sleep: improving - states he slept better on Remeron  Appetite:  improving   Current Medications: Current Facility-Administered Medications  Medication Dose  Route Frequency Provider Last Rate Last Dose  . acetaminophen (TYLENOL) tablet 650 mg  650 mg Oral Q6H PRN Ethelene Hal, NP      . alum & mag hydroxide-simeth (MAALOX/MYLANTA) 200-200-20 MG/5ML suspension 30 mL  30 mL Oral Q4H PRN Ethelene Hal, NP      . atorvastatin (LIPITOR) tablet 10 mg  10 mg Oral q1800 Ethelene Hal, NP   10 mg at 02/04/17 1731  . cloNIDine (CATAPRES) tablet 0.1 mg  0.1 mg Oral QAC breakfast Ethelene Hal, NP   0.1 mg at 02/05/17 0947  . dicyclomine (BENTYL) tablet 20 mg  20 mg Oral Q6H PRN Ethelene Hal, NP   20 mg at 02/02/17 1111  . feeding supplement (ENSURE ENLIVE) (ENSURE ENLIVE) liquid 237 mL  237 mL Oral BID PRN Hampton Abbot, MD   237 mL at 02/02/17 1001  . gabapentin (NEURONTIN) capsule 300 mg  300 mg Oral TID Ethelene Hal, NP   300 mg at 02/05/17 0836  . hydrOXYzine (ATARAX/VISTARIL) tablet 25 mg  25 mg Oral Q6H PRN Ethelene Hal, NP   25 mg at 02/05/17 1114  . insulin aspart (novoLOG) injection 0-15 Units  0-15 Units Subcutaneous TID WC Kerrie Buffalo, NP   8 Units at 02/05/17 1156  . insulin aspart (novoLOG) injection 0-5 Units  0-5 Units Subcutaneous QHS Kerrie Buffalo, NP      . linagliptin (TRADJENTA) tablet 5 mg  5 mg Oral Daily Ethelene Hal, NP   5 mg at 02/05/17 0836  . loperamide (IMODIUM) capsule 2-4 mg  2-4 mg Oral PRN Ethelene Hal, NP      . magnesium hydroxide (MILK OF MAGNESIA) suspension 30 mL  30 mL Oral Daily PRN Ethelene Hal, NP   30 mL at 02/04/17 2136  . methocarbamol (ROBAXIN) tablet 500 mg  500 mg Oral Q8H PRN Ethelene Hal, NP   500 mg at 02/02/17 2109  . mirtazapine (REMERON) tablet 7.5 mg  7.5 mg Oral QHS Myer Peer Marsalis Beaulieu, MD   7.5 mg at 02/04/17 2138  . naproxen (NAPROSYN) tablet 500 mg  500 mg Oral BID PRN Ethelene Hal, NP   500 mg at 02/04/17 2138  . nicotine (NICODERM CQ - dosed in mg/24 hours) patch 21 mg  21 mg Transdermal Q0600 Ethelene Hal, NP   21 mg at 02/05/17 0962  . ondansetron (ZOFRAN-ODT) disintegrating tablet 4 mg  4 mg Oral Q6H PRN Ethelene Hal, NP   4 mg at 02/02/17 1111  . topiramate (TOPAMAX) tablet 50 mg  50 mg Oral BID Ethelene Hal, NP   50 mg at 02/05/17 8366    Lab Results:  Results for orders placed or performed during the hospital encounter of 01/31/17 (from the past 48 hour(s))  Glucose, capillary     Status: Abnormal   Collection Time: 02/03/17  5:08 PM  Result Value Ref Range   Glucose-Capillary 254 (H) 65 - 99 mg/dL  Glucose, capillary      Status: Abnormal   Collection Time: 02/03/17  9:16 PM  Result Value Ref Range   Glucose-Capillary 169 (H) 65 - 99 mg/dL  Glucose, capillary     Status: Abnormal   Collection Time: 02/04/17  6:22 AM  Result Value Ref Range   Glucose-Capillary 272 (H) 65 - 99 mg/dL  Glucose, capillary     Status: Abnormal   Collection Time: 02/04/17 12:08 PM  Result Value Ref Range  Glucose-Capillary 253 (H) 65 - 99 mg/dL  Glucose, capillary     Status: Abnormal   Collection Time: 02/04/17  5:27 PM  Result Value Ref Range   Glucose-Capillary 296 (H) 65 - 99 mg/dL  Glucose, capillary     Status: Abnormal   Collection Time: 02/04/17  9:14 PM  Result Value Ref Range   Glucose-Capillary 187 (H) 65 - 99 mg/dL   Comment 1 Notify RN   Glucose, capillary     Status: Abnormal   Collection Time: 02/05/17  6:15 AM  Result Value Ref Range   Glucose-Capillary 198 (H) 65 - 99 mg/dL   Comment 1 Notify RN   CBC with Differential/Platelet     Status: Abnormal   Collection Time: 02/05/17  6:16 AM  Result Value Ref Range   WBC 5.2 4.0 - 10.5 K/uL   RBC 4.89 4.22 - 5.81 MIL/uL   Hemoglobin 14.9 13.0 - 17.0 g/dL   HCT 42.9 39.0 - 52.0 %   MCV 87.7 78.0 - 100.0 fL   MCH 30.5 26.0 - 34.0 pg   MCHC 34.7 30.0 - 36.0 g/dL   RDW 14.0 11.5 - 15.5 %   Platelets 66 (L) 150 - 400 K/uL    Comment: REPEATED TO VERIFY SPECIMEN CHECKED FOR CLOTS PLATELET COUNT CONFIRMED BY SMEAR    Neutrophils Relative % 61 %   Neutro Abs 3.2 1.7 - 7.7 K/uL   Lymphocytes Relative 26 %   Lymphs Abs 1.4 0.7 - 4.0 K/uL   Monocytes Relative 10 %   Monocytes Absolute 0.5 0.1 - 1.0 K/uL   Eosinophils Relative 2 %   Eosinophils Absolute 0.1 0.0 - 0.7 K/uL   Basophils Relative 0 %   Basophils Absolute 0.0 0.0 - 0.1 K/uL    Comment: Performed at [facility], [place] [address], [place], [place] [address]  Glucose, capillary     Status: Abnormal   Collection Time: 02/05/17 11:03 AM  Result Value Ref Range    Glucose-Capillary 280 (H) 65 - 99 mg/dL    Blood Alcohol level:  Lab Results  Component Value Date   ETH <5 01/31/2017   ETH <5 11/94/1740    Metabolic Disorder Labs: Lab Results  Component Value Date   HGBA1C 8.0 (H) 12/22/2016   MPG 183 12/22/2016   No results found for: PROLACTIN No results found for: CHOL, TRIG, HDL, CHOLHDL, VLDL, LDLCALC  Physical Findings: AIMS: Facial and Oral Movements Muscles of Facial Expression: None, normal Lips and Perioral Area: None, normal Jaw: None, normal Tongue: None, normal,Extremity Movements Upper (arms, wrists, hands, fingers): None, normal Lower (legs, knees, ankles, toes): None, normal, Trunk Movements Neck, shoulders, hips: None, normal, Overall Severity Severity of abnormal movements (highest score from questions above): None, normal Incapacitation due to abnormal movements: None, normal Patient's awareness of abnormal movements (rate only patient's report): No Awareness, Dental Status Current problems with teeth and/or dentures?: No Does patient usually wear dentures?: No  CIWA:  CIWA-Ar Total: 11 COWS:  COWS Total Score: 11  Musculoskeletal: Strength & Muscle Tone: within normal limits Gait & Station: normal Patient leans: N/A  Psychiatric Specialty Exam: Physical Exam  Nursing note and vitals reviewed. Constitutional: He is oriented to person, place, and time. He appears well-developed and well-nourished.  HENT:  Head: Normocephalic and atraumatic.  Eyes: Conjunctivae and EOM are normal. Pupils are equal, round, and reactive to light.  Neck: Normal range of motion. Neck supple.  Cardiovascular: Normal rate and regular rhythm.  Respiratory: Effort normal and breath sounds normal.  GI: Soft. Bowel sounds are normal.  Musculoskeletal: Normal range of motion.  Neurological: He is alert and oriented to person, place, and time. He has normal reflexes.  Skin: Skin is warm and dry.  Psychiatric:  As above    Review of  Systems  All other systems reviewed and are negative. no vomiting, no diarrhea, no bleeding   Blood pressure 124/88, pulse 86, temperature 97.5 F (36.4 C), temperature source Oral, resp. rate 16, height 6' (1.829 m), weight 85.3 kg (188 lb), SpO2 100 %.Body mass index is 25.5 kg/m.  General Appearance: improved grooming , good eye contact, pleasant on approach  Eye Contact:  Good  Speech:  Normal Rate  Volume:  Normal  Mood:  improving, but states he continues to feel " emotional" and endorses mood lability   Affect:  reactive - vaguely anxious   Thought Process:  Linear and Descriptions of Associations: Intact  Orientation:  Full (Time, Place, and Person)  Thought Content:  Poverty of thought.   Suicidal Thoughts:  No no suicidal or self injurious ideations   Homicidal Thoughts:  No denies any homicidal or violent ideations  Memory:   Recent and remote grossly intact   Judgement:  Other:  improving   Insight:  improving   Psychomotor Activity:  Normal  Concentration:  Concentration: Good and Attention Span: Good  Recall:  Good  Fund of Knowledge:  Good  Language:  Good  Akathisia:  Negative  Handed:  right  AIMS (if indicated):     Assets:  Desire for Improvement Resilience  ADL's:  Improving   Cognition:  WNL  Sleep:  Number of Hours: 6.5    Assessment - patient reports improving symptoms of opiate WDL. He reports feeling better, but states he continues to have episodes of feeling anxious, emotional, and feels that his moods are still variable. This may be related to increased ability to process and identify affective states now that he is sober, or also to residual WDL symptoms. Denies suicidal ideations. Remains future oriented , wanting to go to a rehab on discharge.  Tolerating Remeron trial well, slept better last night.  Treatment Plan Summary:  PLAN: treatment plan reviewed as below Encourage ongoing group and milieu participation to work on coping skills and  symptom reduction Continue  Remeron 7.5 mgrs QHS for depression, insomnia Completing Clonidine detox protocol to manage opiate WDL symptoms Continue Neurontin 300 mgrs TID for pain and anxiety Continue Diabetic management   Treatment team working on disposition planning, patient interested in going to [place], [place]  02/05/2017, 12:05 PM   Patient ID: Trula Slade, male   DOB: June 29, 1966, 51 y.o.   MRN: [id number]

## 2017-02-05 NOTE — Progress Notes (Signed)
Patient ID: Chase Reed, male   DOB: Mar 07, 1966, 51 y.o.   MRN: [id number]   D  ---  Pt agrees to contract for safeta nd denies pain.  He complains of overall body aches , headaches and stomach  up-set.  After 100 hrs NA gtroup. Pt became highly anxious with slurred speech and a studder he said has never occurred.  Pt was medicated, see MAR.  Pt thinks it is due to Remeron that he started last night.  Dr. To be notified .Marland Kitchen  Pt continues to show signs of withdrawal/de-tox.  ---  A  --  Provide support  --  R --  Pt safe on unit

## 2017-02-05 NOTE — Progress Notes (Signed)
CSW spoke with pt's [facility] Coordinator, Davina Poke. CSW informed her that pt is hoping to transfer his Medicaid to [facility] and is planning to go for screening at [facility] Care coordinator to call back on Monday for update.   Trula Slade, MSW, LCSW Clinical Social Worker 02/05/2017 3:08 PM

## 2017-02-05 NOTE — Progress Notes (Signed)
Nursing Progress Note 7p-7a  D) Patient presents pleasant and cooperative. Patient is seen interactive in the milieu and attended karaoke this evening. Patient states "they fixed my insulin scale back to how I take it at home." Patient states "the Remeron works so much better for me than the Seroquel. I didn't sleep a wink with that. With the Remeron I am out in an instant". Patient denies SI/HI/AVH or pain. Patient states "I only take Neurontin at home if I am having pain. I don't like taking it regularly here". Patient contracts for safety on the unit.   A) Emotional support given. 1:1 interaction and active listening provided. Patient medicated with PM orders as prescribed. Medications reviewed with patient. Patient verbalized understanding of medications without further questions.  Snacks and fluids provided. Opportunities for questions or concerns presented to patient. Patient encouraged to continue to work on treatment goals. Labs, vital signs, CBG and patient behavior monitored throughout shift. Patient safety maintained with q15 min safety checks.  R) Patient receptive to interaction with nurse. Patient remains safe on the unit at this time. Patient denies any adverse medication reactions at this time. Will continue to monitor.

## 2017-02-05 NOTE — [facility] Group Notes (Signed)
[facility] LCSW Group Therapy  02/05/2017 3:44 PM  Type of Therapy:  Group Therapy  Participation Level:  Active  Participation Quality:  Attentive  Affect:  Appropriate  Cognitive:  Oriented  Insight:  Improving  Engagement in Therapy:  Improving  Modes of Intervention:  Discussion, Education, Exploration, Problem-solving, Socialization and Support  Summary of Progress/Problems: Emotion Regulation: This group focused on both positive and negative emotion identification and allowed group members to process ways to identify feelings, regulate negative emotions, and find healthy ways to manage internal/external emotions. Group members were asked to reflect on a time when their reaction to an emotion led to a negative outcome and explored how alternative responses using emotion regulation would have benefited them. Group members were also asked to discuss a time when emotion regulation was utilized when a negative emotion was experienced.   Quamere Mussell N Smart LCSW 02/05/2017, 3:44 PM

## 2017-02-06 LAB — GLUCOSE, CAPILLARY
GLUCOSE-CAPILLARY: 291 mg/dL — AB (ref 65–99)
GLUCOSE-CAPILLARY: 217 mg/dL — AB (ref 65–99)
GLUCOSE-CAPILLARY: 147 mg/dL — AB (ref 65–99)
Glucose-Capillary: 212 mg/dL — ABNORMAL HIGH (ref 65–99)
Glucose-Capillary: 164 mg/dL — ABNORMAL HIGH (ref 65–99)

## 2017-02-06 MED ORDER — TRAZODONE HCL 50 MG PO TABS
50.0000 mg | ORAL_TABLET | Freq: Every evening | ORAL | Status: DC | PRN
  Administered 2017-02-06: 50 mg via ORAL
  Filled 2017-02-06: qty 1

## 2017-02-06 MED ORDER — CLONIDINE HCL 0.2 MG PO TABS
0.2000 mg | ORAL_TABLET | Freq: Once | ORAL | Status: AC
  Administered 2017-02-06: 0.2 mg via ORAL
  Filled 2017-02-06: qty 1

## 2017-02-06 MED ORDER — HYDROXYZINE HCL 25 MG PO TABS
25.0000 mg | ORAL_TABLET | Freq: Once | ORAL | Status: DC
  Filled 2017-02-06: qty 1

## 2017-02-06 MED ORDER — TRAZODONE HCL 100 MG PO TABS
100.0000 mg | ORAL_TABLET | Freq: Once | ORAL | Status: AC
  Administered 2017-02-06: 100 mg via ORAL
  Filled 2017-02-06 (×2): qty 1

## 2017-02-06 NOTE — [facility] Group Notes (Signed)
[facility] LCSW Group Therapy  02/06/2017 3:17 PM  Type of Therapy:  Group Therapy  Participation Level:  Active  Participation Quality:  Attentive  Affect:  Appropriate  Cognitive:  Alert and Oriented  Insight:  Improving  Engagement in Therapy:  Engaged  Modes of Intervention:  Confrontation, Discussion, Education, Problem-solving, Socialization and Support  Summary of Progress/Problems: Feelings around Relapse. Group members discussed the meaning of relapse and shared personal stories of relapse, how it affected them and others, and how they perceived themselves during this time. Group members were encouraged to identify triggers, warning signs and coping skills used when facing the possibility of relapse. Social supports were discussed and explored in detail.   Aviah Sorci N Smart LCSW 02/06/2017, 3:17 PM

## 2017-02-06 NOTE — Progress Notes (Signed)
D: Patient up and visible in the milieu. Restlessness observed, frequent needs voiced. Initially angry this morning that his sleep was poor however acknowledges he did not alert staff until late in the night. "I'm done with pills! I'm not taking anything today." Patient only took insulin. Rating depression at a "MANIC"/10, hopelessness at a 5/10 and anxiety at a 10/10. Rates sleep as "not at all," appetite as poor, energy as low and concentration as poor. Patient's affect animated, labile, anxious with congruent mood. States goal for today is to "make some calls to transfer medicaid so I can further pursue my recovery." Denies pain, physical problems during assessment however BP became elevated shortly after lunch due to concern regarding a peer. Patient became anxious and stated, "I think this is a panic attack." Complained of numbness and tingling in his extremities, stabbing chest pain.   A: NP made aware of patient's status and order received to admin clonidine 0.2mg  which was done without difficulty. Coached patient through panic episode, deep breathing. Discussed with patient the importance of taking medications. Emotional support given throughout the shift, self inventory reviewed. Encouraged completion of Suicide Safety Plan. Discussed POC with MD, SW.   R: On reassess, patient's VS stabilized, chest pain and tingling resolved within minutes. Patient verbalizes understanding of POC and stated, "I should have taken my meds. It's just the neurontin. It makes me feel dopey." Patient did take 1700 topamax, lipitor and insulin.  Patient denies SI/HI and remains safe on level III obs.

## 2017-02-06 NOTE — Tx Team (Signed)
Interdisciplinary Treatment and Diagnostic Plan Update  02/06/2017 Time of Session: [place] MRN: [id number]  Principal Diagnosis: Bipolar I disorder, most recent episode depressed ([place])  Secondary Diagnoses: Principal Problem:   Bipolar I disorder, most recent episode depressed ([place]) Active Problems:   Polysubstance (including opioids) dependence w/o physiol dependence ([place])   Bipolar 1 disorder, depressed, severe ([place])   Current Medications:  Current Facility-Administered Medications  Medication Dose Route Frequency Provider Last Rate Last Dose  . acetaminophen (TYLENOL) tablet 650 mg  650 mg Oral Q6H PRN Ethelene Hal, NP      . alum & mag hydroxide-simeth (MAALOX/MYLANTA) 200-200-20 MG/5ML suspension 30 mL  30 mL Oral Q4H PRN Ethelene Hal, NP      . atorvastatin (LIPITOR) tablet 10 mg  10 mg Oral q1800 Ethelene Hal, NP   10 mg at 02/05/17 1837  . cloNIDine (CATAPRES) tablet 0.1 mg  0.1 mg Oral QAC breakfast Ethelene Hal, NP   0.1 mg at 02/05/17 5852  . feeding supplement (ENSURE ENLIVE) (ENSURE ENLIVE) liquid 237 mL  237 mL Oral BID PRN Hampton Abbot, MD   237 mL at 02/02/17 1001  . gabapentin (NEURONTIN) capsule 300 mg  300 mg Oral TID Ethelene Hal, NP   300 mg at 02/05/17 1218  . hydrOXYzine (ATARAX/VISTARIL) tablet 25 mg  25 mg Oral Once Rozetta Nunnery, NP      . insulin aspart (novoLOG) injection 0-15 Units  0-15 Units Subcutaneous TID WC Kerrie Buffalo, NP   5 Units at 02/06/17 [phone]  . insulin aspart (novoLOG) injection 0-5 Units  0-5 Units Subcutaneous QHS Kerrie Buffalo, NP   2 Units at 02/05/17 2156  . linagliptin (TRADJENTA) tablet 5 mg  5 mg Oral Daily Ethelene Hal, NP   5 mg at 02/05/17 0836  . magnesium hydroxide (MILK OF MAGNESIA) suspension 30 mL  30 mL Oral Daily PRN Ethelene Hal, NP   30 mL at 02/04/17 2136  . mirtazapine (REMERON) tablet 7.5 mg  7.5 mg Oral QHS Myer Peer Cobos, MD   7.5 mg at 02/05/17  2157  . nicotine (NICODERM CQ - dosed in mg/24 hours) patch 21 mg  21 mg Transdermal Q0600 Ethelene Hal, NP   21 mg at 02/06/17 0804  . topiramate (TOPAMAX) tablet 50 mg  50 mg Oral BID Ethelene Hal, NP   50 mg at 02/05/17 1720   PTA Medications: Prescriptions Prior to Admission  Medication Sig Dispense Refill Last Dose  . atorvastatin (LIPITOR) 10 MG tablet Take 1 tablet (10 mg total) by mouth daily. For high cholesterol 30 tablet 0 Past Week at Unknown time  . buPROPion (WELLBUTRIN SR) 150 MG 12 hr tablet Take 1 tablet (150 mg total) by mouth daily. For depression 30 tablet 0 Past Week at Unknown time  . gabapentin (NEURONTIN) 300 MG capsule Take 1 capsule (300 mg total) by mouth 3 (three) times daily. For agitation 90 capsule 0 Past Week at Unknown time  . insulin aspart (NOVOLOG) 100 UNIT/ML injection Inject 2-8 Units into the skin See admin instructions. Sliding scale.over 250--2 units over 300--4 units over 350--6 units over 400--8 units Up to 50 units daily: For diabetes management 10 mL 0 01/31/2017 at Unknown time  . nicotine (NICODERM CQ - DOSED IN MG/24 HOURS) 21 mg/24hr patch Place 1 patch (21 mg total) onto the skin daily. For smoking cessation 28 patch 0 Past Month at Unknown time  . sitaGLIPtin (  JANUVIA) 100 MG tablet Take 1 tablet (100 mg total) by mouth daily. For diabetes management 30 tablet 0 Past Week at Unknown time    Patient Stressors: Financial difficulties Substance abuse  Patient Strengths: Average or above average intelligence Capable of independent living  Treatment Modalities: Medication Management, Group therapy, Case management,  1 to 1 session with clinician, Psychoeducation, Recreational therapy.   Physician Treatment Plan for Primary Diagnosis: Bipolar I disorder, most recent episode depressed ([place]) Long Term Goal(s): Improvement in symptoms so as ready for discharge Improvement in symptoms so as ready for discharge   Short Term  Goals: Ability to identify changes in lifestyle to reduce recurrence of condition will improve Ability to verbalize feelings will improve Ability to identify changes in lifestyle to reduce recurrence of condition will improve Ability to verbalize feelings will improve Ability to identify and develop effective coping behaviors will improve  Medication Management: Evaluate patient's response, side effects, and tolerance of medication regimen.  Therapeutic Interventions: 1 to 1 sessions, Unit Group sessions and Medication administration.  Evaluation of Outcomes: Met  Physician Treatment Plan for Secondary Diagnosis: Principal Problem:   Bipolar I disorder, most recent episode depressed ([place]) Active Problems:   Polysubstance (including opioids) dependence w/o physiol dependence ([place])   Bipolar 1 disorder, depressed, severe ([place])  Long Term Goal(s): Improvement in symptoms so as ready for discharge Improvement in symptoms so as ready for discharge   Short Term Goals: Ability to identify changes in lifestyle to reduce recurrence of condition will improve Ability to verbalize feelings will improve Ability to identify changes in lifestyle to reduce recurrence of condition will improve Ability to verbalize feelings will improve Ability to identify and develop effective coping behaviors will improve     Medication Management: Evaluate patient's response, side effects, and tolerance of medication regimen.  Therapeutic Interventions: 1 to 1 sessions, Unit Group sessions and Medication administration.  Evaluation of Outcomes: Met   RN Treatment Plan for Primary Diagnosis: Bipolar I disorder, most recent episode depressed ([place]) Long Term Goal(s): Knowledge of disease and therapeutic regimen to maintain health will improve  Short Term Goals: Ability to remain free from injury will improve, Ability to verbalize feelings will improve and Ability to disclose and discuss suicidal  ideas  Medication Management: RN will administer medications as ordered by provider, will assess and evaluate patient's response and provide education to patient for prescribed medication. RN will report any adverse and/or side effects to prescribing provider.  Therapeutic Interventions: 1 on 1 counseling sessions, Psychoeducation, Medication administration, Evaluate responses to treatment, Monitor vital signs and CBGs as ordered, Perform/monitor CIWA, COWS, AIMS and Fall Risk screenings as ordered, Perform wound care treatments as ordered.  Evaluation of Outcomes: Met  LCSW Treatment Plan for Primary Diagnosis: Bipolar I disorder, most recent episode depressed ([place]) Long Term Goal(s): Safe transition to appropriate next level of care at discharge, Engage patient in therapeutic group addressing interpersonal concerns.  Short Term Goals: Engage patient in aftercare planning with referrals and resources, Facilitate patient progression through stages of change regarding substance use diagnoses and concerns and Identify triggers associated with mental health/substance abuse issues  Therapeutic Interventions: Assess for all discharge needs, 1 to 1 time with Social worker, Explore available resources and support systems, Assess for adequacy in community support network, Educate family and significant other(s) on suicide prevention, Complete Psychosocial Assessment, Interpersonal group therapy.  Evaluation of Outcomes: Met  Progress in Treatment: Attending groups: Yes. Participating in groups: Yes. Taking medication as prescribed: Yes.  Toleration medication: Yes. Family/Significant other contact made: SPE completed with pt; pt declined to consent to family contact.  Patient understands diagnosis: Yes. Discussing patient identified problems/goals with staff: Yes. Medical problems stabilized or resolved: Yes. Denies suicidal/homicidal ideation: Yes. Issues/concerns per patient self-inventory:  No. Other: n/a  New problem(s) identified: No, Describe:  n/a  New Short Term/Long Term Goal(s): detox; medication stabilizaton; development of comprehensive mental wellness/sobriety plan.   Discharge Plan or Barriers: Pt completed [facility] referral this week but no beds available. Pt has screening at [facility] on Wed 4/11 at 7;45AM and plans to follow-up at [facility] for outpatient services. He plans to stay with his parents until admission. Pt and CSW worked with his care coordinator Rozelle Logan through [place] [phone]) to get his Medicaid transferred to [facility] by early next week to make him eligible for [facility]. Marjory Lies [facility] admissions aware.   Reason for Continuation of Hospitalization: medication management    Estimated Length of Stay: 1 day (discharge scheduled for Saturday afternoon).   Attendees: Patient: 02/06/2017 10:31 AM  Physician: Dr. Parke Poisson MD 02/06/2017 10:31 AM  Nursing: Jinny Sanders RN; [place] RN 02/06/2017 10:31 AM  RN Care Manager: Lars Pinks CM 02/06/2017 10:31 AM  Social Worker: Maxie Better, LCSW 02/06/2017 10:31 AM  Recreational Therapist: Rhunette Croft 02/06/2017 10:31 AM  Other: Lindell Spar NP; May Augustin NP 02/06/2017 10:31 AM  Other:  02/06/2017 10:31 AM  Other: 02/06/2017 10:31 AM    Scribe for Treatment Team: Kimber Relic Smart, LCSW 02/06/2017 10:31 AM

## 2017-02-06 NOTE — Progress Notes (Signed)
Recreation Therapy Notes  Date:  02/06/17    Time: 0930 Location: 300 Hall Dayroom  Group Topic: Stress Management  Goal Area(s) Addresses:  Patient will verbalize importance of using healthy stress management.  Patient will identify positive emotions associated with healthy stress management.   Behavioral Response: Engaged  Intervention: Stress Management  Activity :  Gratitude Meditation.  LRT introduced the stress management technique of meditation to patients.  LRT played a meditation that allowed patients to focus on breathing and being grateful for people who have been supportive in their lives.  Patients were to follow along as the meditation played to fully participate and engage in the activity.  Education:  Stress Management, Discharge Planning.   Education Outcome: Acknowledges edcuation/In group clarification offered/Needs additional education  Clinical Observations/Feedback: Pt attended group.    Caroll Rancher, LRT/CTRS         Caroll Rancher A 02/06/2017 11:28 AM

## 2017-02-06 NOTE — Progress Notes (Signed)
  [facility] Adult Case Management Discharge Plan :  Will you be returning to the same living situation after discharge:  Yes,  home with parents until able to get into [facility] At discharge, do you have transportation home?: Yes,  parents coming on Saturday afternoon to pick him up (02/07/17). Do you have the ability to pay for your medications: Yes,  [facility] medicaid-in process of getting transferred to [facility]  Release of information consent forms completed and submitted to medical records by CSW.  Patient to Follow up at: Follow-up Information    [facility] Follow up on 02/11/2017.   Why:  You have a screening for admission scheduled for Wednesday at 7:45AM. Please bring photo ID/proof of [facility] residency, 14 day supply of mediations and prescriptions provided to you from hospital.  Thank you.  Contact information: [address] [place] [address] [phone]        [facility] Follow up.   Specialty:  Behavioral Health Why:  Walk in within 7 days hospital discharge or discharge from treatment to be assessed for outpatient services including medication management/counseling/substance abuse groups. Thank you.  Contact information: [address] [place] [address] [phone]           Next level of care provider has access to [facility] Link: No.   Safety Planning and Suicide Prevention discussed: Yes,  SPE completed with pt; pt declined to consent to family contact. [facility] pamphlet and Mobile Crisis information provided.  Have you used any form of tobacco in the last 30 days? (Cigarettes, Smokeless Tobacco, Cigars, and/or Pipes): Yes  Has patient been referred to the Quitline?: Patient refused referral  Patient has been referred for addiction treatment: Yes  Antion Andres N Smart LCSW 02/06/2017, 10:30 AM

## 2017-02-06 NOTE — Progress Notes (Addendum)
[facility] MD Progress Note  02/06/2017 2:16 PM Chase Reed  MRN:  [id number] Subjective:  Reports he continues to feel vaguely anxious and labile, but that overall he is feeling better. Denies any suicidal ideations. Reports his sleep was fair last night and that Remeron " did not seem to help me sleep last night". He reports a long history of insomnia. Future oriented, and states he is planning on staying with his parents for a few days after discharge, expresses that relationship with parents is improved as they see his is motivated in treatment at this time. No medication side effects. Objective:   I have discussed case with treatment team and have met with patient. At this time patient is presenting improved compared to admission- remains vaguely anxious, but affect is more reactive today. He presents future oriented, and expresses interest in going to rehab in the near future . Reports, as above, that his relationship with his parents has improved. Plans to stay with them for a few days before going to [facility]. Denies medication side effects, but states he did not sleep well last night in spite of Remeron. Visible on unit, going to some groups  Principal Problem: Bipolar I disorder, most recent episode depressed ([place]) Diagnosis:   Patient Active Problem List   Diagnosis Date Noted  . Bipolar I disorder, most recent episode depressed ([place]) [F31.30] 01/31/2017  . Bipolar 1 disorder, depressed, severe ([place]) [F31.4] 01/31/2017  . Opiate withdrawal ([place]) [F11.23] 12/21/2016  . Tinea cruris [B35.6] 12/21/2016  . Substance induced mood disorder ([place]) [F19.94] 12/21/2016  . Polysubstance (including opioids) dependence w/o physiol dependence ([place]) [F19.20] 05/02/2016  . Substance-induced psychotic disorder with hallucinations ([place]) [F19.951] 05/02/2016  . Elevated lipase [R74.8] 05/02/2016   Total Time spent with patient: 20 minutes  Past Psychiatric History: As in H&P  Past  Medical History:  Past Medical History:  Diagnosis Date  . Cirrhosis ([place])   . Diabetes mellitus without complication ([place])   . Hepatitis     Past Surgical History:  Procedure Laterality Date  . LITHOTRIPSY     Family History: History reviewed. No pertinent family history. Family Psychiatric  History: As in H&P Social History:  History  Alcohol Use  . Yes    Comment: rarely      History  Drug Use  . Types: Heroin    Social History   Social History  . Marital status: Married    Spouse name: N/A  . Number of children: N/A  . Years of education: N/A   Social History Main Topics  . Smoking status: Current Every Day Smoker    Packs/day: 0.50    Years: 30.00  . Smokeless tobacco: Former Systems developer  . Alcohol use Yes     Comment: rarely   . Drug use: Yes    Types: Heroin  . Sexual activity: No   Other Topics Concern  . None   Social History Narrative  . None   Additional Social History:    History of alcohol / drug use?: Yes Longest period of sobriety (when/how long): On Methadone for 12 years before 9 months ago Negative Consequences of Use: Financial, Personal relationships, Legal Withdrawal Symptoms: Tremors, Diarrhea, Fever / Chills Name of Substance 1: Heroin 1 - Age of First Use: unknown 1 - Amount (size/oz): unspecified (IV use)  1 - Frequency: daily 1 - Duration: 9 months  1 - Last Use / Amount: yesterday at 10p       Sleep: Fair  Appetite:  Good  Current Medications: Current Facility-Administered Medications  Medication Dose Route Frequency Provider Last Rate Last Dose  . acetaminophen (TYLENOL) tablet 650 mg  650 mg Oral Q6H PRN Ethelene Hal, NP      . alum & mag hydroxide-simeth (MAALOX/MYLANTA) 200-200-20 MG/5ML suspension 30 mL  30 mL Oral Q4H PRN Ethelene Hal, NP      . atorvastatin (LIPITOR) tablet 10 mg  10 mg Oral q1800 Ethelene Hal, NP   10 mg at 02/05/17 1837  . cloNIDine (CATAPRES) tablet 0.1 mg  0.1 mg Oral QAC  breakfast Ethelene Hal, NP   0.1 mg at 02/05/17 5631  . feeding supplement (ENSURE ENLIVE) (ENSURE ENLIVE) liquid 237 mL  237 mL Oral BID PRN Hampton Abbot, MD   237 mL at 02/02/17 1001  . gabapentin (NEURONTIN) capsule 300 mg  300 mg Oral TID Ethelene Hal, NP   300 mg at 02/05/17 1218  . hydrOXYzine (ATARAX/VISTARIL) tablet 25 mg  25 mg Oral Once Rozetta Nunnery, NP      . insulin aspart (novoLOG) injection 0-15 Units  0-15 Units Subcutaneous TID WC Kerrie Buffalo, NP   3 Units at 02/06/17 1207  . insulin aspart (novoLOG) injection 0-5 Units  0-5 Units Subcutaneous QHS Kerrie Buffalo, NP   2 Units at 02/05/17 2156  . linagliptin (TRADJENTA) tablet 5 mg  5 mg Oral Daily Ethelene Hal, NP   5 mg at 02/05/17 0836  . magnesium hydroxide (MILK OF MAGNESIA) suspension 30 mL  30 mL Oral Daily PRN Ethelene Hal, NP   30 mL at 02/04/17 2136  . mirtazapine (REMERON) tablet 7.5 mg  7.5 mg Oral QHS Myer Peer Cobos, MD   7.5 mg at 02/05/17 2157  . nicotine (NICODERM CQ - dosed in mg/24 hours) patch 21 mg  21 mg Transdermal Q0600 Ethelene Hal, NP   21 mg at 02/06/17 0804  . topiramate (TOPAMAX) tablet 50 mg  50 mg Oral BID Ethelene Hal, NP   50 mg at 02/05/17 1720  . traZODone (DESYREL) tablet 50 mg  50 mg Oral QHS PRN Jenne Campus, MD        Lab Results:  Results for orders placed or performed during the hospital encounter of 01/31/17 (from the past 48 hour(s))  Glucose, capillary     Status: Abnormal   Collection Time: 02/04/17  5:27 PM  Result Value Ref Range   Glucose-Capillary 296 (H) 65 - 99 mg/dL  Glucose, capillary     Status: Abnormal   Collection Time: 02/04/17  9:14 PM  Result Value Ref Range   Glucose-Capillary 187 (H) 65 - 99 mg/dL   Comment 1 Notify RN   Glucose, capillary     Status: Abnormal   Collection Time: 02/05/17  6:15 AM  Result Value Ref Range   Glucose-Capillary 198 (H) 65 - 99 mg/dL   Comment 1 Notify RN   CBC with  Differential/Platelet     Status: Abnormal   Collection Time: 02/05/17  6:16 AM  Result Value Ref Range   WBC 5.2 4.0 - 10.5 K/uL   RBC 4.89 4.22 - 5.81 MIL/uL   Hemoglobin 14.9 13.0 - 17.0 g/dL   HCT 42.9 39.0 - 52.0 %   MCV 87.7 78.0 - 100.0 fL   MCH 30.5 26.0 - 34.0 pg   MCHC 34.7 30.0 - 36.0 g/dL   RDW 14.0 11.5 - 15.5 %   Platelets 66 (L)  150 - 400 K/uL    Comment: REPEATED TO VERIFY SPECIMEN CHECKED FOR CLOTS PLATELET COUNT CONFIRMED BY SMEAR    Neutrophils Relative % 61 %   Neutro Abs 3.2 1.7 - 7.7 K/uL   Lymphocytes Relative 26 %   Lymphs Abs 1.4 0.7 - 4.0 K/uL   Monocytes Relative 10 %   Monocytes Absolute 0.5 0.1 - 1.0 K/uL   Eosinophils Relative 2 %   Eosinophils Absolute 0.1 0.0 - 0.7 K/uL   Basophils Relative 0 %   Basophils Absolute 0.0 0.0 - 0.1 K/uL    Comment: Performed at [facility], [place] [address] [place], [place] [address]  Glucose, capillary     Status: Abnormal   Collection Time: 02/05/17 11:03 AM  Result Value Ref Range   Glucose-Capillary 280 (H) 65 - 99 mg/dL  Glucose, capillary     Status: Abnormal   Collection Time: 02/05/17  5:16 PM  Result Value Ref Range   Glucose-Capillary 191 (H) 65 - 99 mg/dL  Glucose, capillary     Status: Abnormal   Collection Time: 02/05/17  9:54 PM  Result Value Ref Range   Glucose-Capillary 225 (H) 65 - 99 mg/dL  Glucose, capillary     Status: Abnormal   Collection Time: 02/06/17  6:33 AM  Result Value Ref Range   Glucose-Capillary 212 (H) 65 - 99 mg/dL   Comment 1 Notify RN   Glucose, capillary     Status: Abnormal   Collection Time: 02/06/17 11:59 AM  Result Value Ref Range   Glucose-Capillary 164 (H) 65 - 99 mg/dL   Comment 1 Notify RN   Glucose, capillary     Status: Abnormal   Collection Time: 02/06/17  1:15 PM  Result Value Ref Range   Glucose-Capillary 291 (H) 65 - 99 mg/dL   Comment 1 Notify RN     Blood Alcohol level:  Lab Results  Component Value Date   ETH <5 01/31/2017    ETH <5 22/63/3354    Metabolic Disorder Labs: Lab Results  Component Value Date   HGBA1C 8.0 (H) 12/22/2016   MPG 183 12/22/2016   No results found for: PROLACTIN No results found for: CHOL, TRIG, HDL, CHOLHDL, VLDL, LDLCALC  Physical Findings: AIMS: Facial and Oral Movements Muscles of Facial Expression: None, normal Lips and Perioral Area: None, normal Jaw: None, normal Tongue: None, normal,Extremity Movements Upper (arms, wrists, hands, fingers): None, normal Lower (legs, knees, ankles, toes): None, normal, Trunk Movements Neck, shoulders, hips: None, normal, Overall Severity Severity of abnormal movements (highest score from questions above): None, normal Incapacitation due to abnormal movements: None, normal Patient's awareness of abnormal movements (rate only patient's report): No Awareness, Dental Status Current problems with teeth and/or dentures?: No Does patient usually wear dentures?: No  CIWA:  CIWA-Ar Total: 11 COWS:  COWS Total Score: 1  Musculoskeletal: Strength & Muscle Tone: within normal limits Gait & Station: normal Patient leans: N/A  Psychiatric Specialty Exam: Physical Exam  Nursing note and vitals reviewed. Constitutional: He is oriented to person, place, and time. He appears well-developed and well-nourished.  HENT:  Head: Normocephalic and atraumatic.  Eyes: Conjunctivae and EOM are normal. Pupils are equal, round, and reactive to light.  Neck: Normal range of motion. Neck supple.  Cardiovascular: Normal rate and regular rhythm.   Respiratory: Effort normal and breath sounds normal.  GI: Soft. Bowel sounds are normal.  Musculoskeletal: Normal range of motion.  Neurological: He is alert and oriented to person, place,  and time. He has normal reflexes.  Skin: Skin is warm and dry.  Psychiatric:  As above    Review of Systems  All other systems reviewed and are negative. denies any vomiting or diarrhea No bleeding or bruising reported     Blood pressure (!) 160/118, pulse 91, temperature 98 F (36.7 C), temperature source Oral, resp. rate 16, height 6' (1.829 m), weight 85.3 kg (188 lb), SpO2 100 %.Body mass index is 25.5 kg/m.  General Appearance: improved grooming   Eye Contact:  Good  Speech:  Normal Rate  Volume:  Normal  Mood:  improving mood, states he feels better than on admission  Affect:  reactive, less labile, remains anxious   Thought Process:  Goal Directed and Descriptions of Associations: Intact  Orientation:  Full (Time, Place, and Person)  Thought Content:  Denies any hallucinations, no delusions, not internally preoccupied   Suicidal Thoughts:  No no suicidal or self injurious ideations   Homicidal Thoughts:  No denies any homicidal or violent ideations  Memory:   Recent and remote grossly intact   Judgement:  Other:  improving   Insight:  improving   Psychomotor Activity:  Normal  Concentration:  Concentration: Good and Attention Span: Good  Recall:  Good  Fund of Knowledge:  Good  Language:  Good  Akathisia:  Negative  Handed:  Right  AIMS (if indicated):     Assets:  Desire for Improvement Resilience Social Support  ADL's: improved  Cognition:  WNL  Sleep:  Number of Hours: 3.75    Assessment - patient reports partial improvement- feels better overall and less emotionally labile. Today reports worsened sleep , in spite of  Remeron. He is future oriented, and hoping to discharge soon to parents' home, but states he plans to go to an inpatient rehab later next week. * Of note, patient reports history of Hep C (+) , treated with Harvoni several years ago. AB titers done in 2/18 were reported as > 11. He also has chronically low platelets, although number improving compared to prior test. I have spoken with ID consultant :  RNA testing indicated to see if patient could have been reinfected. ( Patient denies RUQ pain, no jaundice, no choluria, no acholia. He has had no abnormal  bleeding , no  bruising) I have discussed this with patient, states he has an established PCP and Gastroenterologist whom he plans to follow up with for further monitoring /treatment .  Treatment Plan Summary: Treatment plan reviewed as below today 4/6 .  PLAN: treatment plan reviewed as below Encourage ongoing group and milieu participation to work on coping skills and symptom reduction Continue  Remeron 7.5 mgrs QHS for depression, insomnia Start Trazodone 50 mgrs QHS PRN for insomnia  Completing Clonidine detox protocol to manage opiate WDL symptoms Continue Neurontin 300 mgrs TID for pain and anxiety Continue Diabetic management   Treatment team working on disposition planning- see above  Patient encouraged to follow up with PCP for ongoing medical management and work up- see above .    Jenne Campus, MD  02/06/2017, 2:16 PM   Patient ID: Chase Reed, male   DOB: Sep 13, 1966, 51 y.o.   MRN: [id number]

## 2017-02-06 NOTE — Plan of Care (Signed)
Problem: Activity: Goal: Sleeping patterns will improve Outcome: Not Progressing Patient is not sleeping; patient awake during most safety checks and complaining he cannot sleep.

## 2017-02-06 NOTE — Progress Notes (Signed)
Patient is irritable and loud this morning telling staff "I didn't get any sleep last night, and I'm so angry I'm gritting my teeth". Patient agitated during blood pressure reading and moving his arm. Reading was 140/94. Patient states "see, I'm so mad my blood pressure is rising". Patient offered a vistaril and his scheduled clonidine by Clinical research associate. Patient states "no, I am done with that shit. I am done with pills. I will just suffer. The only thing I will take is my insulin". Patient educated about benefits of taking clonidine to reduce blood pressure but patient refused. Patient took 5 units of insulin per sliding scale for CBG 212. Patient observed laughing and talking with peers in the day room after medication was given in no acute distress.

## 2017-02-07 LAB — GLUCOSE, CAPILLARY
Glucose-Capillary: 201 mg/dL — ABNORMAL HIGH (ref 65–99)
Glucose-Capillary: 205 mg/dL — ABNORMAL HIGH (ref 65–99)

## 2017-02-07 MED ORDER — HYDROXYZINE HCL 25 MG PO TABS: 25.0000 mg | ORAL_TABLET | Freq: Once | ORAL | 0 refills | Status: AC

## 2017-02-07 MED ORDER — TRAZODONE HCL 50 MG PO TABS: 50.0000 mg | ORAL_TABLET | Freq: Every evening | ORAL | 0 refills | Status: DC | PRN

## 2017-02-07 MED ORDER — CLONIDINE HCL 0.1 MG PO TABS: 0.1000 mg | ORAL_TABLET | Freq: Every day | ORAL | 11 refills | Status: DC

## 2017-02-07 MED ORDER — TOPIRAMATE 50 MG PO TABS: 50.0000 mg | ORAL_TABLET | Freq: Two times a day (BID) | ORAL | 0 refills | Status: DC

## 2017-02-07 MED ORDER — LINAGLIPTIN 5 MG PO TABS: 5.0000 mg | ORAL_TABLET | Freq: Every day | ORAL | 0 refills | Status: DC

## 2017-02-07 MED ORDER — MIRTAZAPINE 7.5 MG PO TABS: 7.5000 mg | ORAL_TABLET | Freq: Every day | ORAL | 0 refills | Status: DC

## 2017-02-07 NOTE — Progress Notes (Signed)
D: Patient up and visible in the milieu. Restless, frequent needs. Speech loud and pressured at times. Became angry phone was cut off due to group therapy and slammed phone several times. Patient's affect agitated, mood labile. States goal for today is to "discharge." Denies pain, physical problems.   A: Medicated per orders, no prns requested or required. Emotional support offered and self inventory reviewed. Encouraged completion of Suicide Safety Plan. Discussed POC with MD, SW.   R: Patient verbalizes understanding of POC.  Patient denies SI/HI and remains safe on level III obs. Awaiting discharge with family to be here at 1300.

## 2017-02-07 NOTE — Discharge Summary (Signed)
Physician Discharge Summary Note  Patient:  Chase Reed is an 51 y.o., male MRN:  [id number] DOB:  04/14/1966 Patient phone:  [phone] (home)  Patient address:   [address] [place] [address],  Total Time spent with patient: 30 minutes  Date of Admission:  01/31/2017 Date of Discharge: 02/07/2017  Reason for Admission: Per assessment note-Chase Reed an 51 y.o.malewho came to [facility] as a walk in seeking inpatient admission for relapse on heroin and severe depressive symptoms. Pt states that he was on a methadone program for 12 years until a relapse 9 months ago. Pt states that he started using heroin and fentanyl intervenously daily 4 days after he discharged from [facility] last month. He states that he was going to follow up with an [place] but that "fell through" because he was living in [place] without transportation. Pt states that he wants to get clean so that he can be a part of his wife and daughters life who currently are estranged from him and living with his sister. Pt was tearful and states that he we to [facility] in [place] to detox but was turned away due to his insurance not being accepted. He also went to [facility] and they said they couldn't detox him because they didn't see anything in his drug screen. He states he doesn't know how that is possible unless the heroin he has been using is actually "synthetic opiates". He states that they have been calling it "grey ghost" and he feels it may not show up on all drug panels. Pt states that he last used at 10p last night and is starting to have cramps, tremors, chills, body aches and loose stool. He states that he feels if he were to leave here he would "run into traffic" and kill himself because he can't live like this anymore. Pt was tearful and states that he would not be safe if he left. He denies HI or AVH and no history of this. No other drug use reported at this time.  Principal Problem:  Bipolar I disorder, most recent episode depressed [facility]) Discharge Diagnoses: Patient Active Problem List   Diagnosis Date Noted  . Bipolar I disorder, most recent episode depressed ([place]) [F31.30] 01/31/2017  . Bipolar 1 disorder, depressed, severe ([place]) [F31.4] 01/31/2017  . Opiate withdrawal ([place]) [F11.23] 12/21/2016  . Tinea cruris [B35.6] 12/21/2016  . Substance induced mood disorder ([place]) [F19.94] 12/21/2016  . Polysubstance (including opioids) dependence w/o physiol dependence ([place]) [F19.20] 05/02/2016  . Substance-induced psychotic disorder with hallucinations ([place]) [F19.951] 05/02/2016  . Elevated lipase [R74.8] 05/02/2016    Past Psychiatric History:  Past Medical History:  Past Medical History:  Diagnosis Date  . Cirrhosis ([place])   . Diabetes mellitus without complication ([place])   . Hepatitis     Past Surgical History:  Procedure Laterality Date  . LITHOTRIPSY     Family History: History reviewed. No pertinent family history. Family Psychiatric  History:  Social History:  History  Alcohol Use  . Yes    Comment: rarely      History  Drug Use  . Types: Heroin    Social History   Social History  . Marital status: Married    Spouse name: N/A  . Number of children: N/A  . Years of education: N/A   Social History Main Topics  . Smoking status: Current Every Day Smoker    Packs/day: 0.50    Years: 30.00  . Smokeless  tobacco: Former Systems developer  . Alcohol use Yes     Comment: rarely   . Drug use: Yes    Types: Heroin  . Sexual activity: No   Other Topics Concern  . None   Social History Narrative  . None    Hospital Course:  [name] was admitted for Bipolar I disorder, most recent episode depressed ([place])  and crisis management.  Pt was treated discharged with the medications listed below under Medication List.  Medical problems were identified and treated as needed.  Home medications were restarted as appropriate.  Improvement was monitored by  observation and Trula Slade 's daily report of symptom reduction.  Emotional and mental status was monitored by daily self-inventory reports completed by Trula Slade and clinical staff.         [name] was evaluated by the treatment team for stability and plans for continued recovery upon discharge. ATHOL BOLDS 's motivation was an integral factor for scheduling further treatment. Employment, transportation, bed availability, health status, family support, and any pending legal issues were also considered during hospital stay. Pt was offered further treatment options upon discharge including but not limited to Residential, Intensive Outpatient, and Outpatient treatment.  JAISHON KRISHER will follow up with the services as listed below under Follow Up Information.     Upon completion of this admission the patient was both mentally and medically stable for discharge denying suicidal/homicidal ideation, auditory/visual/tactile hallucinations, delusional thoughts and paranoia.    Trula Slade responded well to treatment with Neurontin 300 mg, trazodone 50 mg and Remeron 7.5 nmg without adverse effects.  Pt demonstrated improvement without reported or observed adverse effects to the point of stability appropriate for outpatient management. Pertinent labs include: CMP and CBCDepakote 120 (high), Prolactin 28 (high), Hgb A1C (high), for which outpatient follow-up is necessary for lab recheck as mentioned below. Reviewed CBC, CMP, BAL, and UDS+ opiates; all unremarkable aside from noted exceptions.   Physical Findings: AIMS: Facial and Oral Movements Muscles of Facial Expression: None, normal Lips and Perioral Area: None, normal Jaw: None, normal Tongue: None, normal,Extremity Movements Upper (arms, wrists, hands, fingers): None, normal Lower (legs, knees, ankles, toes): None, normal, Trunk Movements Neck, shoulders, hips: None, normal, Overall Severity Severity of abnormal movements  (highest score from questions above): None, normal Incapacitation due to abnormal movements: None, normal Patient's awareness of abnormal movements (rate only patient's report): No Awareness, Dental Status Current problems with teeth and/or dentures?: No Does patient usually wear dentures?: No  CIWA:  CIWA-Ar Total: 11 COWS:  COWS Total Score: 1  Musculoskeletal: Strength & Muscle Tone: within normal limits Gait & Station: normal Patient leans: N/A  Psychiatric Specialty Exam: See SRA by MD Physical Exam  Constitutional: He is oriented to person, place, and time.  Musculoskeletal: Normal range of motion.  Neurological: He is alert and oriented to person, place, and time.  Psychiatric: He has a normal mood and affect. His behavior is normal.    Review of Systems  Psychiatric/Behavioral: Negative for depression (stable). The patient is not nervous/anxious (stable).     Blood pressure 117/89, pulse 91, temperature 98 F (36.7 C), temperature source Oral, resp. rate 18, height 6' (1.829 m), weight 85.3 kg (188 lb), SpO2 99 %.Body mass index is 25.5 kg/m.  Have you used any form of tobacco in the last 30 days? (Cigarettes, Smokeless Tobacco, Cigars, and/or Pipes): Yes  Has this patient used any form of tobacco in the  last 30 days? (Cigarettes, Smokeless Tobacco, Cigars, and/or Pipes)  Yes, A prescription for an FDA-approved tobacco cessation medication was offered at discharge and the patient refused  Blood Alcohol level:  Lab Results  Component Value Date   [facility] <5 01/31/2017   ETH <5 57/84/6962    Metabolic Disorder Labs:  Lab Results  Component Value Date   HGBA1C 8.0 (H) 12/22/2016   MPG 183 12/22/2016   No results found for: PROLACTIN No results found for: CHOL, TRIG, HDL, CHOLHDL, VLDL, LDLCALC  See Psychiatric Specialty Exam and Suicide Risk Assessment completed by Attending Physician prior to discharge.  Discharge destination:  Home  Is patient on multiple  antipsychotic therapies at discharge:  No   Has Patient had three or more failed trials of antipsychotic monotherapy by history:  No  Recommended Plan for Multiple Antipsychotic Therapies: NA  Discharge Instructions    Diet - low sodium heart healthy    Complete by:  As directed    Discharge instructions    Complete by:  As directed    Take all medications as prescribed. Keep all follow-up appointments as scheduled.  Do not consume alcohol or use illegal drugs while on prescription medications. Report any adverse effects from your medications to your primary care provider promptly.  In the event of recurrent symptoms or worsening symptoms, call 911, a crisis hotline, or go to the nearest emergency department for evaluation.   Increase activity slowly    Complete by:  As directed      Allergies as of 02/07/2017      Reactions   Suboxone [buprenorphine Hcl-naloxone Hcl] Nausea And Vomiting   Hyper- withdrawals      Medication List    STOP taking these medications   buPROPion 150 MG 12 hr tablet Commonly known as:  WELLBUTRIN SR   sitaGLIPtin 100 MG tablet Commonly known as:  JANUVIA     TAKE these medications     Indication  atorvastatin 10 MG tablet Commonly known as:  LIPITOR Take 1 tablet (10 mg total) by mouth daily. For high cholesterol  Indication:  Elevation of Both Cholesterol and Triglycerides in Blood   cloNIDine 0.1 MG tablet Commonly known as:  CATAPRES Take 1 tablet (0.1 mg total) by mouth daily before breakfast.  Indication:  High Blood Pressure Disorder   gabapentin 300 MG capsule Commonly known as:  NEURONTIN Take 1 capsule (300 mg total) by mouth 3 (three) times daily. For agitation  Indication:  Agitation   hydrOXYzine 25 MG tablet Commonly known as:  ATARAX/VISTARIL Take 1 tablet (25 mg total) by mouth once.  Indication:  Anxiety Neurosis   insulin aspart 100 UNIT/ML injection Commonly known as:  novoLOG Inject 2-8 Units into the skin See  admin instructions. Sliding scale.over 250--2 units over 300--4 units over 350--6 units over 400--8 units Up to 50 units daily: For diabetes management  Indication:  Type 2 Diabetes, Per outpatient provider only   linagliptin 5 MG Tabs tablet Commonly known as:  TRADJENTA Take 1 tablet (5 mg total) by mouth daily. Start taking on:  02/08/2017  Indication:  Type 2 Diabetes   mirtazapine 7.5 MG tablet Commonly known as:  REMERON Take 1 tablet (7.5 mg total) by mouth at bedtime.  Indication:  Major Depressive Disorder   nicotine 21 mg/24hr patch Commonly known as:  NICODERM CQ - dosed in mg/24 hours Place 1 patch (21 mg total) onto the skin daily. For smoking cessation  Indication:  Nicotine Addiction  topiramate 50 MG tablet Commonly known as:  TOPAMAX Take 1 tablet (50 mg total) by mouth 2 (two) times daily.  Indication:  Migraine Headache   traZODone 50 MG tablet Commonly known as:  DESYREL Take 1 tablet (50 mg total) by mouth at bedtime as needed for sleep.  Indication:  Trouble Sleeping      Follow-up Information    [facility] Follow up on 02/11/2017.   Why:  You have a screening for admission scheduled for Wednesday at 7:45AM. Please bring photo ID/proof of [facility] residency, 14 day supply of mediations and prescriptions provided to you from hospital.  Thank you.  Contact information: [place] [address] [phone]        [facility] Follow up.   Specialty:  Behavioral Health Why:  Walk in within 7 days hospital discharge or discharge from treatment to be assessed for outpatient services including medication management/counseling/substance abuse groups. Thank you.  Contact information: [place] [address] [phone]           Follow-up recommendations:  Activity:  as tolerated Diet:  heat healthy  Comments:  Take all medications as prescribed. Keep all follow-up appointments as scheduled.  Do not  consume alcohol or use illegal drugs while on prescription medications. Report any adverse effects from your medications to your primary care provider promptly.  In the event of recurrent symptoms or worsening symptoms, call 911, a crisis hotline, or go to the nearest emergency department for evaluation.   Signed: Derrill Center, NP 02/07/2017, 9:21 AM

## 2017-02-07 NOTE — Progress Notes (Signed)
D: Pt was in the dayroom upon initial approach.  Pt presents with labile affect and mood.  He reports he is "leaving tomorrow at 1."  Pt reports he plans to go "to the folks' for a few days and then to [facility]."  His goal is to "have a good night."  He states "I haven't slept for 3 days."  Pt is irritated and attention-seeking at times.  Pt denies SI/HI, denies pain.  Pt was visible in milieu at times; he chose not to attend evening group.    A: Introduced self to pt.  Actively listened to pt and offered support and encouragement. Medication administered per order.  PRN medication administered for sleep.  Pt continued to report difficulty sleeping after PRN medication was administered.  On-site provider notified and Trazodone 100 mg POX1 was ordered and administered.   R: Pt is safe on the unit.  Pt is compliant with medications.  Pt verbally contracts for safety.  Will continue to monitor and assess.

## 2017-02-07 NOTE — BHH Group Notes (Signed)
LCSW Group Therapy Note  02/07/2017 at 10:00 AM  Type of Therapy and Topic:  Group Therapy: Avoiding Self-Sabotaging and Enabling Behaviors  Participation Level:  Did Not Attend, invited to participate yet did not despite overhead announcement and encouragement by staff. Patient reported he was due to discharge.   Summary: The main focus of today's process group was to explain to the adolescent what "self-sabotage" means and use Motivational Interviewing to discuss what benefits, negative or positive, were involved in a self-identified self-sabotaging behaBHH vior. We then discussed about reasons the patient may want to change the behavior and their current desire to change.  Carney Bern, LCSW

## 2017-02-07 NOTE — Progress Notes (Signed)
Patient verbalizes readiness for discharge. Follow up plan explained, AVS, transition record and SRA given along with prescriptions. All belongings returned. Patient verbalizes understanding. Denies SI/HI and assures this Clinical research associate he will seek assistance should that change. Patient discharged ambulatory and in stable condition to wife.

## 2017-02-07 NOTE — BHH Suicide Risk Assessment (Addendum)
[facility] Discharge Suicide Risk Assessment   Principal Problem: Bipolar I disorder, most recent episode depressed [facility]) Discharge Diagnoses:  Patient Active Problem List   Diagnosis Date Noted  . Bipolar I disorder, most recent episode depressed ([facility]) [F31.30] 01/31/2017  . Bipolar 1 disorder, depressed, severe ([facility]) [F31.4] 01/31/2017  . Opiate withdrawal ([facility]) [F11.23] 12/21/2016  . Tinea cruris [B35.6] 12/21/2016  . Substance induced mood disorder (HCC) [F19.94] 12/21/2016  . 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 Elevated lipase [R74.8] 05/02/2016    Total Time spent with patient: 30 minutes  Musculoskeletal: Strength & Muscle Tone: within normal limits Gait & Station: normal Patient leans: N/A  Psychiatric Specialty Exam: ROS no chest pain , no shortness of breath, no vomiting, no fever, no chills No bleeding.  Blood pressure (!) 136/105, pulse 91, temperature 97.8 F (36.6 C), resp. rate 18, height 6' (1.829 m), weight 85.3 kg (188 lb), SpO2 99 %.Body mass index is 25.5 kg/m.  General Appearance: Fairly Groomed- improving   Eye Contact::  Good  Speech:  Normal Rate- not pressured   Volume:  Normal  Mood:  reports mood has improved since admission  Affect:  more reactive, remains anxious   Thought Process:  Linear and Descriptions of Associations: Intact- no flight of ideations  Orientation:  Other:  fully alert and attentive   Thought Content:  no hallucinations,no delusions, not internally preoccupied   Suicidal Thoughts:  No denies any suicidal or self injurious ideations, denies any homicidal or violent ideations   Homicidal Thoughts:  No  Memory:  recent and remote grossly intact   Judgement:  Other:  improving   Insight:  improving   Psychomotor Activity:  Normal  Concentration:  Good  Recall:  Good  Fund of  Knowledge:Good  Language: Good  Akathisia:  Negative  Handed:  Right  AIMS (if indicated):     Assets:  Desire for Improvement Resilience Social Support  Sleep:  Number of Hours: 4.25  Cognition: WNL  ADL's:  Intact   Mental Status Per Nursing Assessment::   On Admission:     Demographic Factors:  51 year old , married,has two children.   Loss Factors: Relapse, distancing from family related to relapse   Historical Factors: History of Opiate Dependence, history of mood disorder, states that his mood disorder has normalized, improved during prior  episodes of sustained sobriety.  Denies history of suicide attempts    Risk Reduction Factors:   Sense of responsibility to family, Living with another person, especially a relative and Positive coping skills or problem solving skills  Continued Clinical Symptoms:  At this time patient is alert,attentive, well related, speech normal, describes improving mood , but reports some ongoing emotional lability,  anxiety. No thought disorder, no suicidal or self injurious ideations, no hallucinations, no delusions, not internally preoccupied, future oriented. Plans to go live with his parents for a period of a few days and then later next week plans to go to [facility] inpatient rehab. Reports ongoing insomnia as major ongoing issue, but states he thinks he will sleep better once he is home with family . No disruptive or agitated behaviors on unit .  Denies medication side effects. * Patient has continued to report feeling some emotional lability, subjective restlessness, and insomnia. At this time no pressured speech, no psychomotor restlessness, no grandiosity, no flight of ideations. We reviewed history further- patient reports episodes  of mood disorder have tended to be substance induced, and have improved/normalized  with sustained sobriety. He does agree to seek prompt psychiatric care or return to ED/hospital if mood/lability  worsens in spite of abstinence , sobriety .  Cognitive Features That Contribute To Risk:  No gross cognitive deficits noted upon discharge. Is alert , attentive, and oriented x 3   Suicide Risk:  Mild:  Suicidal ideation of limited frequency, intensity, duration, and specificity.  There are no identifiable plans, no associated intent, mild dysphoria and related symptoms, good self-control (both objective and subjective assessment), few other risk factors, and identifiable protective factors, including available and accessible social support.  Follow-up Information    [facility] Follow up on 02/11/2017.   Why:  You have a screening for admission scheduled for Wednesday at 7:45AM. Please bring photo ID/proof of [facility] residency, 14 day supply of mediations and prescriptions provided to you from hospital.  Thank you.  Contact information: [address] [place] [address] [phone]        [name] Follow up.   Specialty:  Behavioral Health Why:  Walk in within 7 days hospital discharge or discharge from treatment to be assessed for outpatient services including medication management/counseling/substance abuse groups. Thank you.  Contact information: [address] [place] [address] [phone]           Plan Of Care/Follow-up recommendations:  Activity:  as tolerated  Diet:  Hearth Healthy, Diabetic Diet  Tests:  NA Other:  See below  Patient is leaving unit in good spirits  Plans to go live with his parents for a few days and later next week plans to go to [facility]. States he plans to go to [place] or [employer] . Plans to follow up as above . We have reviewed importance to follow up with PCP, and with GI specialist , regarding history of Hep C (+) - patient expresses concern he might have been reinfected over recent years, and also to follow up on chronic thrombocytopenia.   Craige Cotta, MD 02/07/2017, 11:00 AM

## 2017-03-13 ENCOUNTER — Other Ambulatory Visit (HOSPITAL_COMMUNITY): Payer: Self-pay | Admitting: Psychiatry

## 2017-04-08 ENCOUNTER — Other Ambulatory Visit (HOSPITAL_COMMUNITY): Payer: Self-pay | Admitting: Psychiatry

## 2017-05-08 ENCOUNTER — Emergency Department (HOSPITAL_COMMUNITY)
Admission: EM | Admit: 2017-05-08 | Discharge: 2017-05-11 | Disposition: A | Discharge status: Home / Self Care | Payer: Medicaid Other | Attending: Emergency Medicine | Admitting: Emergency Medicine

## 2017-05-08 ENCOUNTER — Ambulatory Visit (HOSPITAL_COMMUNITY)
Admission: RE | Admit: 2017-05-08 | Discharge: 2017-05-08 | Disposition: A | Discharge status: Home / Self Care | Payer: Medicaid Other | Attending: Psychiatry | Admitting: Psychiatry

## 2017-05-08 ENCOUNTER — Encounter (HOSPITAL_COMMUNITY): Payer: Self-pay | Admitting: Nurse Practitioner

## 2017-05-08 DIAGNOSIS — Z79899 Other long term (current) drug therapy: Secondary | ICD-10-CM | POA: Diagnosis not present

## 2017-05-08 DIAGNOSIS — F1920 Other psychoactive substance dependence, uncomplicated: Secondary | ICD-10-CM | POA: Diagnosis present

## 2017-05-08 DIAGNOSIS — Z046 Encounter for general psychiatric examination, requested by authority: Secondary | ICD-10-CM | POA: Insufficient documentation

## 2017-05-08 DIAGNOSIS — F17210 Nicotine dependence, cigarettes, uncomplicated: Secondary | ICD-10-CM | POA: Diagnosis not present

## 2017-05-08 DIAGNOSIS — F17200 Nicotine dependence, unspecified, uncomplicated: Secondary | ICD-10-CM | POA: Diagnosis not present

## 2017-05-08 DIAGNOSIS — F419 Anxiety disorder, unspecified: Secondary | ICD-10-CM | POA: Diagnosis not present

## 2017-05-08 DIAGNOSIS — Z794 Long term (current) use of insulin: Secondary | ICD-10-CM | POA: Diagnosis not present

## 2017-05-08 DIAGNOSIS — F329 Major depressive disorder, single episode, unspecified: Secondary | ICD-10-CM | POA: Diagnosis present

## 2017-05-08 DIAGNOSIS — F1190 Opioid use, unspecified, uncomplicated: Secondary | ICD-10-CM | POA: Diagnosis not present

## 2017-05-08 DIAGNOSIS — R45851 Suicidal ideations: Secondary | ICD-10-CM | POA: Diagnosis not present

## 2017-05-08 DIAGNOSIS — E119 Type 2 diabetes mellitus without complications: Secondary | ICD-10-CM | POA: Insufficient documentation

## 2017-05-08 DIAGNOSIS — F3130 Bipolar disorder, current episode depressed, mild or moderate severity, unspecified: Secondary | ICD-10-CM | POA: Diagnosis present

## 2017-05-08 DIAGNOSIS — F333 Major depressive disorder, recurrent, severe with psychotic symptoms: Secondary | ICD-10-CM | POA: Diagnosis not present

## 2017-05-08 DIAGNOSIS — F1994 Other psychoactive substance use, unspecified with psychoactive substance-induced mood disorder: Secondary | ICD-10-CM | POA: Diagnosis not present

## 2017-05-08 DIAGNOSIS — F1120 Opioid dependence, uncomplicated: Secondary | ICD-10-CM | POA: Diagnosis present
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
## 2017-05-08 NOTE — ED Triage Notes (Signed)
[facility] the suicide thoughts he is experiencing and opioid withdrawal. He states he does not have a particular plan but is "better of not alive."

## 2017-05-09 ENCOUNTER — Encounter (HOSPITAL_COMMUNITY): Payer: Self-pay | Admitting: Registered Nurse

## 2017-05-09 DIAGNOSIS — F3130 Bipolar disorder, current episode depressed, mild or moderate severity, unspecified: Secondary | ICD-10-CM | POA: Diagnosis not present

## 2017-05-09 DIAGNOSIS — G4700 Insomnia, unspecified: Secondary | ICD-10-CM | POA: Diagnosis not present

## 2017-05-09 DIAGNOSIS — F1994 Other psychoactive substance use, unspecified with psychoactive substance-induced mood disorder: Secondary | ICD-10-CM | POA: Diagnosis not present

## 2017-05-09 DIAGNOSIS — R45851 Suicidal ideations: Secondary | ICD-10-CM | POA: Diagnosis not present

## 2017-05-09 DIAGNOSIS — F419 Anxiety disorder, unspecified: Secondary | ICD-10-CM | POA: Diagnosis not present

## 2017-05-09 DIAGNOSIS — F17210 Nicotine dependence, cigarettes, uncomplicated: Secondary | ICD-10-CM

## 2017-05-09 LAB — COMPREHENSIVE METABOLIC PANEL
ALBUMIN: 4 g/dL (ref 3.5–5.0)
ALT: 21 U/L (ref 17–63)
ANION GAP: 6 (ref 5–15)
AST: 23 U/L (ref 15–41)
Alkaline Phosphatase: 86 U/L (ref 38–126)
BUN: 16 mg/dL (ref 6–20)
CALCIUM: 9 mg/dL (ref 8.9–10.3)
CO2: 27 mmol/L (ref 22–32)
Chloride: 104 mmol/L (ref 101–111)
Creatinine, Ser: 0.99 mg/dL (ref 0.61–1.24)
GFR calc non Af Amer: >60 mL/min (ref >60)
Glucose, Bld: 294 mg/dL — ABNORMAL HIGH (ref 65–99)
POTASSIUM: 3.8 mmol/L (ref 3.5–5.1)
Sodium: 137 mmol/L (ref 135–145)
Total Bilirubin: 0.2 mg/dL — ABNORMAL LOW (ref 0.3–1.2)
Total Protein: 7.4 g/dL (ref 6.5–8.1)

## 2017-05-09 LAB — RAPID URINE DRUG SCREEN, HOSP PERFORMED
Amphetamines: NOT DETECTED
BARBITURATES: NOT DETECTED
Benzodiazepines: NOT DETECTED
Cocaine: POSITIVE — AB
Opiates: POSITIVE — AB
TETRAHYDROCANNABINOL: NOT DETECTED

## 2017-05-09 LAB — CBC
HCT: 34.4 % — ABNORMAL LOW (ref 39.0–52.0)
HEMOGLOBIN: 12.5 g/dL — AB (ref 13.0–17.0)
MCH: 30.9 pg (ref 26.0–34.0)
MCHC: 36.3 g/dL — ABNORMAL HIGH (ref 30.0–36.0)
MCV: 85.1 fL (ref 78.0–100.0)
Platelets: 52 10*3/uL — ABNORMAL LOW (ref 150–400)
RBC: 4.04 MIL/uL — ABNORMAL LOW (ref 4.22–5.81)
RDW: 13.5 % (ref 11.5–15.5)
WBC: 3.3 10*3/uL — AB (ref 4.0–10.5)

## 2017-05-09 LAB — CBG MONITORING, ED
GLUCOSE-CAPILLARY: 214 mg/dL — AB (ref 65–99)
GLUCOSE-CAPILLARY: 224 mg/dL — AB (ref 65–99)
GLUCOSE-CAPILLARY: 266 mg/dL — AB (ref 65–99)
Glucose-Capillary: 156 mg/dL — ABNORMAL HIGH (ref 65–99)

## 2017-05-09 LAB — ACETAMINOPHEN LEVEL

## 2017-05-09 LAB — ETHANOL: Alcohol, Ethyl (B): <5 mg/dL (ref <5)

## 2017-05-09 LAB — SALICYLATE LEVEL

## 2017-05-09 MED ORDER — ACETAMINOPHEN 325 MG PO TABS: 650.0000 mg | ORAL_TABLET | ORAL | Status: DC | PRN

## 2017-05-09 MED ORDER — LINAGLIPTIN 5 MG PO TABS
5.0000 mg | ORAL_TABLET | Freq: Every day | ORAL | Status: DC
  Administered 2017-05-09 – 2017-05-11 (×3): 5 mg via ORAL
  Filled 2017-05-09 (×3): qty 1

## 2017-05-09 MED ORDER — ALUM & MAG HYDROXIDE-SIMETH 200-200-20 MG/5ML PO SUSP: 30.0000 mL | Freq: Four times a day (QID) | ORAL | Status: DC | PRN

## 2017-05-09 MED ORDER — CLONIDINE HCL 0.1 MG PO TABS
0.1000 mg | ORAL_TABLET | Freq: Every day | ORAL | Status: DC
  Administered 2017-05-09 – 2017-05-11 (×3): 0.1 mg via ORAL
  Filled 2017-05-09 (×3): qty 1

## 2017-05-09 MED ORDER — INSULIN ASPART 100 UNIT/ML ~~LOC~~ SOLN
0.0000 [IU] | Freq: Three times a day (TID) | SUBCUTANEOUS | Status: DC
Start: 2017-05-09 — End: 2017-05-11
  Administered 2017-05-09 (×2): 5 [IU] via SUBCUTANEOUS
  Administered 2017-05-09: 3 [IU] via SUBCUTANEOUS
  Administered 2017-05-10: 8 [IU] via SUBCUTANEOUS
  Administered 2017-05-10 – 2017-05-11 (×3): 5 [IU] via SUBCUTANEOUS
  Filled 2017-05-09 (×5): qty 1

## 2017-05-09 MED ORDER — ATORVASTATIN CALCIUM 10 MG PO TABS
10.0000 mg | ORAL_TABLET | Freq: Every day | ORAL | Status: DC
  Administered 2017-05-09 – 2017-05-11 (×3): 10 mg via ORAL
  Filled 2017-05-09 (×3): qty 1

## 2017-05-09 MED ORDER — HYDROXYZINE HCL 25 MG PO TABS
25.0000 mg | ORAL_TABLET | Freq: Three times a day (TID) | ORAL | Status: DC | PRN
  Administered 2017-05-09 – 2017-05-10 (×2): 25 mg via ORAL
  Filled 2017-05-09 (×2): qty 1

## 2017-05-09 MED ORDER — TOPIRAMATE 25 MG PO TABS
50.0000 mg | ORAL_TABLET | Freq: Two times a day (BID) | ORAL | Status: DC
  Administered 2017-05-09 – 2017-05-11 (×5): 50 mg via ORAL
  Filled 2017-05-09 (×5): qty 2

## 2017-05-09 MED ORDER — TRAZODONE HCL 50 MG PO TABS
50.0000 mg | ORAL_TABLET | Freq: Every evening | ORAL | Status: DC | PRN
  Administered 2017-05-10: 50 mg via ORAL
  Filled 2017-05-09: qty 1

## 2017-05-09 MED ORDER — ONDANSETRON HCL 4 MG PO TABS
4.0000 mg | ORAL_TABLET | Freq: Three times a day (TID) | ORAL | Status: DC | PRN
  Administered 2017-05-09 – 2017-05-10 (×3): 4 mg via ORAL
  Filled 2017-05-09 (×3): qty 1

## 2017-05-09 MED ORDER — IBUPROFEN 200 MG PO TABS: 600.0000 mg | ORAL_TABLET | Freq: Three times a day (TID) | ORAL | Status: DC | PRN

## 2017-05-09 MED ORDER — INSULIN GLARGINE 100 UNIT/ML ~~LOC~~ SOLN
15.0000 [IU] | Freq: Every day | SUBCUTANEOUS | Status: DC
  Administered 2017-05-09 – 2017-05-11 (×3): 15 [IU] via SUBCUTANEOUS
  Filled 2017-05-09 (×3): qty 0.15

## 2017-05-09 MED ORDER — INSULIN ASPART 100 UNIT/ML ~~LOC~~ SOLN: 2.0000 [IU] | SUBCUTANEOUS | Status: DC

## 2017-05-09 MED ORDER — ZOLPIDEM TARTRATE 5 MG PO TABS: 5.0000 mg | ORAL_TABLET | Freq: Every evening | ORAL | Status: DC | PRN

## 2017-05-09 MED ORDER — GABAPENTIN 300 MG PO CAPS
300.0000 mg | ORAL_CAPSULE | Freq: Three times a day (TID) | ORAL | Status: DC | PRN
  Administered 2017-05-09: 300 mg via ORAL
  Filled 2017-05-09: qty 1

## 2017-05-09 MED ORDER — MIRTAZAPINE 7.5 MG PO TABS
7.5000 mg | ORAL_TABLET | Freq: Every day | ORAL | Status: DC
  Administered 2017-05-09 – 2017-05-10 (×3): 7.5 mg via ORAL
  Filled 2017-05-09 (×4): qty 1

## 2017-05-09 MED ORDER — NICOTINE 21 MG/24HR TD PT24
21.0000 mg | MEDICATED_PATCH | Freq: Every day | TRANSDERMAL | Status: DC
  Administered 2017-05-09 – 2017-05-10 (×2): 21 mg via TRANSDERMAL
  Filled 2017-05-09 (×2): qty 1

## 2017-05-09 NOTE — ED Notes (Signed)
Low carb snack and beverage given.

## 2017-05-09 NOTE — ED Notes (Signed)
Report to include situation, background, assessment and recommendations from Edie RN. Patient sleeping, respirations regular and unlabored. Q15 minute rounds and security camera observation to continue.   

## 2017-05-09 NOTE — ED Notes (Signed)
Consulted with Dr. Caprice RedNonavati concerning blood sugar. Will reassess in AM per orders.

## 2017-05-09 NOTE — ED Provider Notes (Signed)
[facility]-EMERGENCY DEPT Provider Note   CSN: [id number] Arrival date & time: 05/08/17  2313     History   Chief Complaint Chief Complaint  Patient presents with  . Suicidal  . Medical Clearance    HPI SUKHDEEP WIETING is a 51 y.o. male.  The history is provided by the patient and medical records.    51 year old male with history of cirrhosis, diabetes, bipolar disorder, substance-induced mood disorder, hepatitis, presenting to the ED from [facility] for medical clearance.  Patient states he has had suicidal thoughts recently, but no specific plan. States he wanted to be "quick" as he feels it is better if "he is just not here".  Denies homicidal ideation.  Admits to ongoing opiate use including heroin, last use was yesterday.  Patient does not sleep very much, only about 2-1/2 hours a night.  Patient currently living along away from wife and daughter which he feels is due to his substance abuse.  Has had prior inpatient treatment in the past but states he's not sure "why it didn't take".   Patient seen earlier today at [facility], recommended inpatient treatment.  Past Medical History:  Diagnosis Date  . Cirrhosis (HCC)   . Diabetes mellitus without complication (HCC)   . Hepatitis     Patient Active Problem List   Diagnosis Date Noted  . Bipolar I disorder, most recent episode depressed (HCC) 01/31/2017  . Bipolar 1 disorder, depressed, severe (HCC) 01/31/2017  . Opiate withdrawal (HCC) 12/21/2016  . Tinea cruris 12/21/2016  . Substance induced mood disorder (HCC) 12/21/2016  . Polysubstance (including opioids) dependence w/o physiol dependence (HCC) 05/02/2016  . 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 Elevated lipase 05/02/2016    Past Surgical History:  Procedure Laterality Date  . LITHOTRIPSY         Home Medications    Prior to Admission medications   Medication Sig Start Date End Date Taking? Authorizing Provider  atorvastatin (LIPITOR) 10  MG tablet Take 1 tablet (10 mg total) by mouth daily. For high cholesterol 12/25/16  Yes Armandina Stammer I, NP  cloNIDine (CATAPRES) 0.1 MG tablet Take 1 tablet (0.1 mg total) by mouth daily before breakfast. 02/07/17  Yes Oneta Rack, NP  gabapentin (NEURONTIN) 300 MG capsule Take 1 capsule (300 mg total) by mouth 3 (three) times daily. For agitation Patient taking differently: Take 300 mg by mouth 3 (three) times daily as needed. For pain 12/25/16  Yes Armandina Stammer I, NP  hydrOXYzine (VISTARIL) 25 MG capsule Take 25 mg by mouth 3 (three) times daily as needed for anxiety.   Yes [provider]  insulin aspart (NOVOLOG) 100 UNIT/ML injection Inject 2-8 Units into the skin See admin instructions. Sliding scale.over 250--2 units over 300--4 units over 350--6 units over 400--8 units Up to 50 units daily: For diabetes management 12/25/16  Yes Armandina Stammer I, NP  insulin glargine (LANTUS) 100 UNIT/ML injection Inject 15 Units into the skin every morning.   Yes [provider]  linagliptin (TRADJENTA) 5 MG TABS tablet Take 1 tablet (5 mg total) by mouth daily. 02/08/17  Yes Oneta Rack, NP  mirtazapine (REMERON) 7.5 MG tablet Take 1 tablet (7.5 mg total) by mouth at bedtime. 02/07/17  Yes Oneta Rack, NP  Soft Lens Products (SALINE SENSITIVE EYES) SOLN Place 1 drop into both eyes daily as needed. Dry eye   Yes [provider]  topiramate (TOPAMAX) 50 MG tablet Take 1 tablet (50 mg total)  by mouth 2 (two) times daily. 02/07/17  Yes Oneta RackLewis, Tanika N, NP  traZODone (DESYREL) 50 MG tablet Take 1 tablet (50 mg total) by mouth at bedtime as needed for sleep. 02/07/17  Yes Oneta RackLewis, Tanika N, NP  nicotine (NICODERM CQ - DOSED IN MG/24 HOURS) 21 mg/24hr patch Place 1 patch (21 mg total) onto the skin daily. For smoking cessation Patient not taking: Reported on 05/09/2017 12/26/16   Sanjuana KavaNwoko, Agnes I, NP    Family History History reviewed. No pertinent family history.  Social History Social  History  Substance Use Topics  . Smoking status: Current Every Day Smoker    Packs/day: 0.50    Years: 30.00  . Smokeless tobacco: Former NeurosurgeonUser  . Alcohol use Yes     Comment: rarely      Allergies   Suboxone [buprenorphine hcl-naloxone hcl]   Review of Systems Review of Systems  Psychiatric/Behavioral: Positive for suicidal ideas.  All other systems reviewed and are negative.    Physical Exam Updated Vital Signs BP (!) 141/97 (BP Location: Left Arm)   Pulse 77   Temp 98.1 F (36.7 C) (Oral)   Resp 14   SpO2 99%   Physical Exam  Constitutional: He is oriented to person, place, and time. He appears well-developed and well-nourished.  HENT:  Head: Normocephalic and atraumatic.  Mouth/Throat: Oropharynx is clear and moist.  Eyes: Conjunctivae and EOM are normal. Pupils are equal, round, and reactive to light.  Neck: Normal range of motion.  Cardiovascular: Normal rate, regular rhythm and normal heart sounds.   Pulmonary/Chest: Effort normal and breath sounds normal.  Abdominal: Soft. Bowel sounds are normal.  Musculoskeletal: Normal range of motion.  Neurological: He is alert and oriented to person, place, and time.  Skin: Skin is warm and dry.  Psychiatric: He exhibits a depressed mood. He expresses suicidal ideation. He expresses no homicidal ideation. He expresses no suicidal plans and no homicidal plans.  Nursing note and vitals reviewed.    ED Treatments / Results  Labs (all labs ordered are listed, but only abnormal results are displayed) Labs Reviewed  COMPREHENSIVE METABOLIC PANEL - Abnormal; Notable for the following:       Result Value   Glucose, Bld 294 (*)    Total Bilirubin 0.2 (*)    All other components within normal limits  ACETAMINOPHEN LEVEL - Abnormal; Notable for the following:    Acetaminophen (Tylenol), Serum <10 (*)    All other components within normal limits  CBC - Abnormal; Notable for the following:    WBC 3.3 (*)    RBC 4.04 (*)      Hemoglobin 12.5 (*)    HCT 34.4 (*)    MCHC 36.3 (*)    Platelets 52 (*)    All other components within normal limits  ETHANOL  SALICYLATE LEVEL  RAPID URINE DRUG SCREEN, HOSP PERFORMED    EKG  EKG Interpretation None       Radiology No results found.  Procedures Procedures (including critical care time)  Medications Ordered in ED Medications - No data to display   Initial Impression / Assessment and Plan / ED Course  I have reviewed the triage vital signs and the nursing notes.  Pertinent labs & imaging results that were available during my care of the patient were reviewed by me and considered in my medical decision making (see chart for details).  51 year old male here with suicidal ideation. [facility] and sent here awaiting  IP placement bed.  Continues to admit to suicidal ideation. Patient's labs overall reassuring. He has hyperglycemia without findings of DKA. Thrombocytopenia which is known for him and appears stable. He has no active bleeding. Feel patient can be medically cleared. Home meds have been ordered.  Patient is diabetic, already on sliding scale insulin with home meds so will continue that.  Final Clinical Impressions(s) / ED Diagnoses   Final diagnoses:  Suicidal ideation    New Prescriptions New Prescriptions   No medications on file     Garlon Hatchet, PA-C 05/09/17 Zada Finders, MD 05/09/17 2316

## 2017-05-09 NOTE — ED Notes (Signed)
Consulted with Dr. Ranae PalmsYelverton ref. Blood sugar. Will reassess in AM.

## 2017-05-09 NOTE — ED Notes (Signed)
Hourly rounding reveals patient sleeping in room. No complaints, stable, in no acute distress. Q15 minute rounds and monitoring via Security Cameras to continue. 

## 2017-05-09 NOTE — ED Notes (Signed)
Pt is calm and cooperative.  He denies S/I, H/I, and AVH.  Pt doesn't appear to be in any acute distress.  He complains of generalized pain but doesn't wish to have any pain medication.  He has slept most of the day.  He is compliant with his medication.  15 minute checks and video monitoring continue.

## 2017-05-09 NOTE — Consult Note (Signed)
[facility] Consult   Reason for Consult:  Suicidal ideation Referring Physician:  EDP Patient Identification: Chase Reed MRN:  [id number] Principal Diagnosis: Bipolar I disorder, most recent episode depressed [facility]) Diagnosis:   Patient Active Problem List   Diagnosis Date Noted  . Bipolar I disorder, most recent episode depressed ([place]) [F31.30] 01/31/2017  . Bipolar 1 disorder, depressed, severe ([place]) [F31.4] 01/31/2017  . Opiate withdrawal ([place]) [F11.23] 12/21/2016  . Tinea cruris [B35.6] 12/21/2016  . Substance induced mood disorder ([place]) [F19.94] 12/21/2016  . Polysubstance (including opioids) dependence w/o physiol dependence ([place]) [F19.20] 05/02/2016  . Substance-induced psychotic disorder with hallucinations ([place]) [F19.951] 05/02/2016  . Elevated lipase [R74.8] 05/02/2016    Total Time spent with patient: 45 minutes  Subjective:   Chase Reed is a 51 y.o. male patient presented to [facility] with complaints of depression and wanting assistance with drug problem.  HPI:  51 y.o., male presented to [facility] voluntary with complaints of worsening depression and seeking help related to his drug abuse (opiates/cocaine).  Medications will be started for patient stabilized.  Today patient is alert/oriented, calm/cooperative.Patient reports that he continues to be depressed; he is able to contract for safety but states that he feels worthless and needs assistance/rehab with drug problem.  Patient denies homicidal ideation, thoughts of self injury, and paranoia.  At this time he also denies auditory hallucinations.    Past Psychiatric History: Polysubstance dependence, Substance induced mood disorder, and Bipolar disorder  Risk to Self: Is patient at risk for suicide?: Yes Risk to Others:  No Prior Inpatient Therapy:  Yes Prior Outpatient Therapy:  Yes  Past Medical History:  Past Medical History:  Diagnosis Date  . Cirrhosis ([place])   . Diabetes mellitus without  complication ([place])   . Hepatitis     Past Surgical History:  Procedure Laterality Date  . LITHOTRIPSY     Family History: History reviewed. No pertinent family history. Family Psychiatric  History: Denies Social History:  History  Alcohol Use  . Yes    Comment: rarely      History  Drug Use  . Types: Heroin    Social History   Social History  . Marital status: Married    Spouse name: N/A  . Number of children: N/A  . Years of education: N/A   Social History Main Topics  . Smoking status: Current Every Day Smoker    Packs/day: 0.50    Years: 30.00  . Smokeless tobacco: Former Systems developer  . Alcohol use Yes     Comment: rarely   . Drug use: Yes    Types: Heroin  . Sexual activity: No   Other Topics Concern  . None   Social History Narrative  . None   Additional Social History:    Allergies:   Allergies  Allergen Reactions  . Suboxone [Buprenorphine Hcl-Naloxone Hcl] Nausea And Vomiting    Hyper- withdrawals    Labs:  Results for orders placed or performed during the hospital encounter of 05/08/17 (from the past 48 hour(s))  Comprehensive metabolic panel     Status: Abnormal   Collection Time: 05/08/17 11:54 PM  Result Value Ref Range   Sodium 137 135 - 145 mmol/L   Potassium 3.8 3.5 - 5.1 mmol/L   Chloride 104 101 - 111 mmol/L   CO2 27 22 - 32 mmol/L   Glucose, Bld 294 (H) 65 - 99 mg/dL   BUN 16 6 - 20 mg/dL   Creatinine, Ser 0.99 0.61 -  1.24 mg/dL   Calcium 9.0 8.9 - 10.3 mg/dL   Total Protein 7.4 6.5 - 8.1 g/dL   Albumin 4.0 3.5 - 5.0 g/dL   AST 23 15 - 41 U/L   ALT 21 17 - 63 U/L   Alkaline Phosphatase 86 38 - 126 U/L   Total Bilirubin 0.2 (L) 0.3 - 1.2 mg/dL   GFR calc non Af Amer >60 >60 mL/min   GFR calc Af Amer >60 >60 mL/min    Comment: (NOTE) The eGFR has been calculated using the CKD EPI equation. This calculation has not been validated in all clinical situations. eGFR's persistently <60 mL/min signify possible Chronic Kidney Disease.     Anion gap 6 5 - 15  Ethanol     Status: None   Collection Time: 05/08/17 11:54 PM  Result Value Ref Range   Alcohol, Ethyl (B) <5 <5 mg/dL    Comment:        LOWEST DETECTABLE LIMIT FOR SERUM ALCOHOL IS 5 mg/dL FOR MEDICAL PURPOSES ONLY   Salicylate level     Status: None   Collection Time: 05/08/17 11:54 PM  Result Value Ref Range   Salicylate Lvl <9.6 2.8 - 30.0 mg/dL  Acetaminophen level     Status: Abnormal   Collection Time: 05/08/17 11:54 PM  Result Value Ref Range   Acetaminophen (Tylenol), Serum <10 (L) 10 - 30 ug/mL    Comment:        THERAPEUTIC CONCENTRATIONS VARY SIGNIFICANTLY. A RANGE OF 10-30 ug/mL MAY BE AN EFFECTIVE CONCENTRATION FOR MANY PATIENTS. HOWEVER, SOME ARE BEST TREATED AT CONCENTRATIONS OUTSIDE THIS RANGE. ACETAMINOPHEN CONCENTRATIONS >150 ug/mL AT 4 HOURS AFTER INGESTION AND >50 ug/mL AT 12 HOURS AFTER INGESTION ARE OFTEN ASSOCIATED WITH TOXIC REACTIONS.   cbc     Status: Abnormal   Collection Time: 05/08/17 11:54 PM  Result Value Ref Range   WBC 3.3 (L) 4.0 - 10.5 K/uL   RBC 4.04 (L) 4.22 - 5.81 MIL/uL   Hemoglobin 12.5 (L) 13.0 - 17.0 g/dL   HCT 34.4 (L) 39.0 - 52.0 %   MCV 85.1 78.0 - 100.0 fL   MCH 30.9 26.0 - 34.0 pg   MCHC 36.3 (H) 30.0 - 36.0 g/dL   RDW 13.5 11.5 - 15.5 %   Platelets 52 (L) 150 - 400 K/uL    Comment: REPEATED TO VERIFY SPECIMEN CHECKED FOR CLOTS PLATELET COUNT CONFIRMED BY SMEAR   CBG monitoring, ED     Status: Abnormal   Collection Time: 05/09/17  7:35 AM  Result Value Ref Range   Glucose-Capillary 156 (H) 65 - 99 mg/dL  Rapid urine drug screen (hospital performed)     Status: Abnormal   Collection Time: 05/09/17 11:03 AM  Result Value Ref Range   Opiates POSITIVE (A) NONE DETECTED   Cocaine POSITIVE (A) NONE DETECTED   Benzodiazepines NONE DETECTED NONE DETECTED   Amphetamines NONE DETECTED NONE DETECTED   Tetrahydrocannabinol NONE DETECTED NONE DETECTED   Barbiturates NONE DETECTED NONE DETECTED     Comment:        DRUG SCREEN FOR MEDICAL PURPOSES ONLY.  IF CONFIRMATION IS NEEDED FOR ANY PURPOSE, NOTIFY LAB WITHIN 5 DAYS.        LOWEST DETECTABLE LIMITS FOR URINE DRUG SCREEN Drug Class       Cutoff (ng/mL) Amphetamine      1000 Barbiturate      200 Benzodiazepine   222 Tricyclics       979 Opiates  300 Cocaine          300 THC              50   CBG monitoring, ED     Status: Abnormal   Collection Time: 05/09/17 12:15 PM  Result Value Ref Range   Glucose-Capillary 214 (H) 65 - 99 mg/dL    Current Facility-Administered Medications  Medication Dose Route Frequency Provider Last Rate Last Dose  . acetaminophen (TYLENOL) tablet 650 mg  650 mg Oral Q4H PRN Larene Pickett, PA-C      . alum & mag hydroxide-simeth (MAALOX/MYLANTA) 200-200-20 MG/5ML suspension 30 mL  30 mL Oral Q6H PRN Larene Pickett, PA-C      . atorvastatin (LIPITOR) tablet 10 mg  10 mg Oral Daily Quincy Carnes M, PA-C   10 mg at 05/09/17 1035  . cloNIDine (CATAPRES) tablet 0.1 mg  0.1 mg Oral QAC breakfast Larene Pickett, PA-C   0.1 mg at 05/09/17 0745  . gabapentin (NEURONTIN) capsule 300 mg  300 mg Oral TID PRN Larene Pickett, PA-C      . hydrOXYzine (ATARAX/VISTARIL) tablet 25 mg  25 mg Oral TID PRN Larene Pickett, PA-C      . ibuprofen (ADVIL,MOTRIN) tablet 600 mg  600 mg Oral Q8H PRN Larene Pickett, PA-C      . insulin aspart (novoLOG) injection 0-15 Units  0-15 Units Subcutaneous TID WC Drenda Freeze, MD   3 Units at 05/09/17 [phone]  . insulin glargine (LANTUS) injection 15 Units  15 Units Subcutaneous Daily Larene Pickett, PA-C   15 Units at 05/09/17 1034  . linagliptin (TRADJENTA) tablet 5 mg  5 mg Oral Daily Larene Pickett, PA-C   5 mg at 05/09/17 1035  . mirtazapine (REMERON) tablet 7.5 mg  7.5 mg Oral QHS Larene Pickett, PA-C   7.5 mg at 05/09/17 0141  . nicotine (NICODERM CQ - dosed in mg/24 hours) patch 21 mg  21 mg Transdermal Daily Quincy Carnes M, PA-C      . ondansetron [facility])  tablet 4 mg  4 mg Oral Q8H PRN Larene Pickett, PA-C      . topiramate (TOPAMAX) tablet 50 mg  50 mg Oral BID Larene Pickett, PA-C   50 mg at 05/09/17 1035  . traZODone (DESYREL) tablet 50 mg  50 mg Oral QHS PRN Larene Pickett, PA-C      . zolpidem [facility]) tablet 5 mg  5 mg Oral QHS PRN Larene Pickett, PA-C       Current Outpatient Prescriptions  Medication Sig Dispense Refill  . atorvastatin (LIPITOR) 10 MG tablet Take 1 tablet (10 mg total) by mouth daily. For high cholesterol 30 tablet 0  . cloNIDine (CATAPRES) 0.1 MG tablet Take 1 tablet (0.1 mg total) by mouth daily before breakfast. 60 tablet 11  . gabapentin (NEURONTIN) 300 MG capsule Take 1 capsule (300 mg total) by mouth 3 (three) times daily. For agitation (Patient taking differently: Take 300 mg by mouth 3 (three) times daily as needed. For pain) 90 capsule 0  . hydrOXYzine (VISTARIL) 25 MG capsule Take 25 mg by mouth 3 (three) times daily as needed for anxiety.    . insulin aspart (NOVOLOG) 100 UNIT/ML injection Inject 2-8 Units into the skin See admin instructions. Sliding scale.over 250--2 units over 300--4 units over 350--6 units over 400--8 units Up to 50 units daily: For diabetes management 10 mL 0  . insulin glargine (  LANTUS) 100 UNIT/ML injection Inject 15 Units into the skin every morning.    . linagliptin (TRADJENTA) 5 MG TABS tablet Take 1 tablet (5 mg total) by mouth daily. 30 tablet 0  . mirtazapine (REMERON) 7.5 MG tablet Take 1 tablet (7.5 mg total) by mouth at bedtime. 30 tablet 0  . Soft Lens Products (SALINE SENSITIVE EYES) SOLN Place 1 drop into both eyes daily as needed. Dry eye    . topiramate (TOPAMAX) 50 MG tablet Take 1 tablet (50 mg total) by mouth 2 (two) times daily. 30 tablet 0  . traZODone (DESYREL) 50 MG tablet Take 1 tablet (50 mg total) by mouth at bedtime as needed for sleep. 30 tablet 0  . nicotine (NICODERM CQ - DOSED IN MG/24 HOURS) 21 mg/24hr patch Place 1 patch (21 mg total) onto the skin  daily. For smoking cessation (Patient not taking: Reported on 05/09/2017) 28 patch 0    Musculoskeletal: Strength & Muscle Tone: within normal limits Gait & Station: normal Patient leans: N/A  Psychiatric Specialty Exam: Physical Exam  Vitals reviewed. Neck: Normal range of motion.    ROS  Blood pressure 138/84, pulse 67, temperature 98.2 F (36.8 C), temperature source Oral, resp. rate 18, SpO2 98 %.There is no height or weight on file to calculate BMI.  General Appearance: Fairly Groomed  Eye Contact:  Good  Speech:  Clear and Coherent and Normal Rate  Volume:  Normal  Mood:  Depressed  Affect:  Congruent  Thought Process:  Goal Directed  Orientation:  Full (Time, Place, and Person)  Thought Content:  Logical   Suicidal Thoughts:  Yes.  without intent/plan but is able to contract for safety at this time  Homicidal Thoughts:  No  Memory:  Immediate;   Good Recent;   Good Remote;   Good  Judgement:  Fair  Insight:  Lacking  Psychomotor Activity:  Normal  Concentration:  Concentration: Good and Attention Span: Good  Recall:  Good  Fund of Knowledge:  Fair  Language:  Good  Akathisia:  No  Handed:  Right  AIMS (if indicated):     Assets:  Communication Skills Desire for Improvement Housing Social Support  ADL's:  Intact  Cognition:  WNL  Sleep:        Treatment Plan Summary: Daily contact with patient to assess and evaluate symptoms and progress in treatment, Medication management and Plan Inpatient psychiatric treatment  Medication management home medications restarted:   Remeron 7.5 mg for depression/insomnia Trazodone 50 mg Q hs prn sleep Vistaril 25 mg Tid prn anxiety   Disposition: Recommend psychiatric Inpatient admission when medically cleared.  Rankin, Shuvon, NP 05/09/2017 1:17 PM  Patient seen face-to-face for psychiatric evaluation, chart reviewed and case discussed with the physician extender and developed treatment plan. Reviewed the information  documented and agree with the treatment plan. Corena Pilgrim, MD

## 2017-05-09 NOTE — Progress Notes (Signed)
Per psychiatrist Eksir and NP Rankin, patient meets inpatient criteria. CSW faxed patient's referral to: [facility], [facility], [place], [place], [facility] and [facility].  Lenford Beddow, LCSWA [facility]  Clinical Social Worker [phone] 

## 2017-05-09 NOTE — H&P (Signed)
Behavioral Health Medical Screening Exam  Chase Reed is an 51 y.o. male.  Total Time spent with patient: 15 minutes  Psychiatric Specialty Exam: Physical Exam  Constitutional: He is oriented to person, place, and time. He appears well-developed and well-nourished. No distress.  HENT:  Head: Normocephalic and atraumatic.  Right Ear: External ear normal.  Left Ear: External ear normal.  Eyes: Conjunctivae are normal. Pupils are equal, round, and reactive to light. Right eye exhibits no discharge. Left eye exhibits no discharge. No scleral icterus.  Neck: Normal range of motion.  Cardiovascular: Normal rate, regular rhythm and normal heart sounds.   Respiratory: Effort normal and breath sounds normal. No respiratory distress.  Musculoskeletal: Normal range of motion.  Neurological: He is alert and oriented to person, place, and time.  Skin: Skin is warm and dry. He is not diaphoretic.    Review of Systems  Constitutional: Positive for diaphoresis. Negative for chills, fever and weight loss.  Respiratory: Negative for cough and shortness of breath.   Cardiovascular: Negative for chest pain and palpitations.  Musculoskeletal: Positive for myalgias.  Neurological: Negative for dizziness.  Psychiatric/Behavioral: Positive for depression, hallucinations, substance abuse and suicidal ideas. Negative for memory loss. The patient is nervous/anxious and has insomnia.   All other systems reviewed and are negative.   Blood pressure 135/90, pulse 80, temperature 98.5 F (36.9 C), resp. rate 18, SpO2 98 %.There is no height or weight on file to calculate BMI.  General Appearance: Disheveled  Eye Contact:  Fair  Speech:  Clear and Coherent and Normal Rate  Volume:  Normal  Mood:  Anxious, Depressed, Dysphoric, Hopeless, Irritable and Worthless  Affect:  Congruent and Depressed  Thought Process:  Coherent  Orientation:  Full (Time, Place, and Person)  Thought Content:  Logical and  Hallucinations: Auditory  Suicidal Thoughts:  Yes.  without intent/plan  Homicidal Thoughts:  No  Memory:  Immediate;   Good Recent;   Fair Remote;   Fair  Judgement:  Impaired  Insight:  Fair  Psychomotor Activity:  Normal  Concentration: Concentration: Fair and Attention Span: Fair  Recall:  [employer]  Fund of Knowledge:Good  Language: Good  Akathisia:  No  Handed:  Right  AIMS (if indicated):     Assets:  Communication Skills Desire for Improvement Financial Resources/Insurance Leisure Time  Sleep:       Musculoskeletal: Strength & Muscle Tone: within normal limits Gait & Station: normal   Blood pressure 135/90, pulse 80, temperature 98.5 F (36.9 C), resp. rate 18, SpO2 98 %.  Recommendations:  Based on my evaluation the patient does not appear to have an emergency medical condition.  Jackelyn PolingJason A Eustolia Drennen, NP 05/09/2017, 12:45 AM

## 2017-05-09 NOTE — ED Notes (Signed)
Hourly rounding reveals patient in room. No complaints, stable, in no acute distress. Q15 minute rounds and monitoring via Security Cameras to continue. 

## 2017-05-09 NOTE — ED Notes (Signed)
Pt. Transferred to [facility] from ED to room 37 after screening for contraband. Report to include Situation, Background, Assessment and Recommendations from Oscar RN. Pt. Oriented to unit including Q15 minute rounds as well as the security cameras for their protection. Patient is alert and oriented, warm and dry in no acute distress. Patient denies SI, HI, and AVH. Pt. Encouraged to let me know if needs arise. 

## 2017-05-10 LAB — CBG MONITORING, ED
GLUCOSE-CAPILLARY: 207 mg/dL — AB (ref 65–99)
GLUCOSE-CAPILLARY: 239 mg/dL — AB (ref 65–99)
GLUCOSE-CAPILLARY: 270 mg/dL — AB (ref 65–99)
Glucose-Capillary: 259 mg/dL — ABNORMAL HIGH (ref 65–99)

## 2017-05-10 MED ORDER — LOPERAMIDE HCL 2 MG PO CAPS
4.0000 mg | ORAL_CAPSULE | Freq: Once | ORAL | Status: AC
  Administered 2017-05-10: 4 mg via ORAL
  Filled 2017-05-10: qty 2

## 2017-05-10 MED ORDER — LOPERAMIDE HCL 2 MG PO CAPS: 2.0000 mg | ORAL_CAPSULE | ORAL | Status: DC | PRN

## 2017-05-10 NOTE — ED Notes (Signed)
Pt. Refuses CBG.

## 2017-05-10 NOTE — ED Notes (Signed)
Hourly rounding reveals patient sleeping in room. No complaints, stable, in no acute distress. Q15 minute rounds and monitoring via Security Cameras to continue. 

## 2017-05-10 NOTE — ED Notes (Signed)
Hourly rounding reveals patient in room. No complaints, stable, in no acute distress. Q15 minute rounds and monitoring via Security Cameras to continue. 

## 2017-05-10 NOTE — ED Notes (Signed)
Pt sleeping at present, no distress noted, calm & cooperative.  Monitoring for safety, Q 15 min checks in effect. 

## 2017-05-10 NOTE — ED Notes (Signed)
Patient yelling out in his room intermittently.

## 2017-05-10 NOTE — ED Notes (Signed)
Pt. C/o mild nausea. 

## 2017-05-10 NOTE — ED Notes (Signed)
Pt. C/o not being able to have a snack at this time. Unit rules posted at nurses station regarding snack times brought to his attention for the second or third time since his arrival.

## 2017-05-10 NOTE — ED Notes (Signed)
Patient was sleeping at this time

## 2017-05-10 NOTE — ED Notes (Signed)
Report to include Situation, Background, Assessment, and Recommendations received from [facility]. Patient alert and oriented, warm and dry, in no acute distress. Patient denies HI, AVH and pain. Patient states he still has SI without a plan. Patient made aware of Q15 minute rounds and security cameras for their safety. Patient instructed to come to me with needs or concerns.

## 2017-05-11 DIAGNOSIS — F17210 Nicotine dependence, cigarettes, uncomplicated: Secondary | ICD-10-CM | POA: Diagnosis not present

## 2017-05-11 DIAGNOSIS — F1190 Opioid use, unspecified, uncomplicated: Secondary | ICD-10-CM

## 2017-05-11 DIAGNOSIS — F419 Anxiety disorder, unspecified: Secondary | ICD-10-CM | POA: Diagnosis not present

## 2017-05-11 DIAGNOSIS — F1994 Other psychoactive substance use, unspecified with psychoactive substance-induced mood disorder: Secondary | ICD-10-CM | POA: Diagnosis not present

## 2017-05-11 LAB — CBG MONITORING, ED: GLUCOSE-CAPILLARY: 236 mg/dL — AB (ref 65–99)

## 2017-05-11 MED ORDER — TRAZODONE HCL 50 MG PO TABS
50.0000 mg | ORAL_TABLET | Freq: Every evening | ORAL | 0 refills | Status: DC | PRN
Start: 2017-05-11 — End: 2020-05-23

## 2017-05-11 MED ORDER — GABAPENTIN 300 MG PO CAPS: 300.0000 mg | ORAL_CAPSULE | Freq: Three times a day (TID) | ORAL | 0 refills | Status: DC

## 2017-05-11 MED ORDER — HYDROXYZINE PAMOATE 25 MG PO CAPS
25.0000 mg | ORAL_CAPSULE | Freq: Three times a day (TID) | ORAL | 0 refills | Status: DC | PRN
Start: 2017-05-11 — End: 2019-01-25

## 2017-05-11 MED ORDER — TOPIRAMATE 50 MG PO TABS: 50.0000 mg | ORAL_TABLET | Freq: Two times a day (BID) | ORAL | 0 refills | Status: DC

## 2017-05-11 MED ORDER — MIRTAZAPINE 7.5 MG PO TABS: 7.5000 mg | ORAL_TABLET | Freq: Every day | ORAL | 0 refills | Status: DC

## 2017-05-11 MED ORDER — IBUPROFEN 200 MG PO TABS: 600.0000 mg | ORAL_TABLET | Freq: Three times a day (TID) | ORAL | Status: DC | PRN

## 2017-05-11 NOTE — ED Notes (Signed)
Hourly rounding reveals patient sleeping in room. No complaints, stable, in no acute distress. Q15 minute rounds and monitoring via Security Cameras to continue. 

## 2017-05-11 NOTE — BH Assessment (Signed)
[facility] Assessment Progress Note  Per Mojeed Akintayo, MD, this pt does not require psychiatric hospitalization at this time.  Pt is to be discharged from [facility] with referral information for area substance abuse treatment providers.  This has been included in pt's discharge instructions.  Pt's nurse, Edie, has been notified.  Diesel Lina, MA Triage Specialist [phone]     

## 2017-05-11 NOTE — Consult Note (Signed)
[facility] Face-to-Face Psychiatry Consult   Reason for Consult:  Substance abuse Referring Physician:  EDP Patient Identification: Chase Reed MRN:  [id number] Principal Diagnosis: Substance induced mood disorder (HCC) Diagnosis:   Patient Active Problem List   Diagnosis Date Noted  . Substance induced mood disorder (HCC) [F19.94] 12/21/2016    Priority: High  . Polysubstance (including opioids) dependence w/o physiol dependence (HCC) [F19.20] 05/02/2016    Priority: High  . Suicidal ideation [R45.851]   . Opiate withdrawal (HCC) [F11.23] 12/21/2016  . Tinea cruris [B35.6] 12/21/2016  . 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 Elevated lipase [R74.8] 05/02/2016    Total Time spent with patient: 30 minutes  Subjective:   Chase Reed is a 51 y.o. male patient does not warrant admission.  HPI:  51 yo male who presented to the ED with substance abuse, referred by his lawyer to get disability for his mental health which comprises substance abuse.  No suicidal/homicidal ideations, hallucinations, or withdrawal symptoms.  He has been using cocaine and opiates.  He has a court date tomorrow for a few traffic violations, insurance issues.  He has been here since Saturday and stabilized.  Past Psychiatric History: substance abuse  Risk to Self: None Risk to Others:  None Prior Inpatient Therapy:  Multiple Prior Outpatient Therapy:  Yes  Past Medical History:  Past Medical History:  Diagnosis Date  . Cirrhosis (HCC)   . Diabetes mellitus without complication (HCC)   . Hepatitis     Past Surgical History:  Procedure Laterality Date  . LITHOTRIPSY     Family History: History reviewed. No pertinent family history. Family Psychiatric  History:  Substance abuse Social History:  History  Alcohol Use  . Yes    Comment: rarely      History  Drug Use  . Types: Heroin    Social History   Social History  . Marital status: Married     Spouse name: N/A  . Number of children: N/A  . Years of education: N/A   Social History Main Topics  . Smoking status: Current Every Day Smoker    Packs/day: 0.50    Years: 30.00  . Smokeless tobacco: Former Neurosurgeon  . Alcohol use Yes     Comment: rarely   . Drug use: Yes    Types: Heroin  . Sexual activity: No   Other Topics Concern  . None   Social History Narrative  . None   Additional Social History:    Allergies:   Allergies  Allergen Reactions  . Suboxone [Buprenorphine Hcl-Naloxone Hcl] Nausea And Vomiting    Hyper- withdrawals    Labs:  Results for orders placed or performed during the hospital encounter of 05/08/17 (from the past 48 hour(s))  Rapid urine drug screen (hospital performed)     Status: Abnormal   Collection Time: 05/09/17 11:03 AM  Result Value Ref Range   Opiates POSITIVE (A) NONE DETECTED   Cocaine POSITIVE (A) NONE DETECTED   Benzodiazepines NONE DETECTED NONE DETECTED   Amphetamines NONE DETECTED NONE DETECTED   Tetrahydrocannabinol NONE DETECTED NONE DETECTED   Barbiturates NONE DETECTED NONE DETECTED    Comment:        DRUG SCREEN FOR MEDICAL PURPOSES ONLY.  IF CONFIRMATION IS NEEDED FOR ANY PURPOSE, NOTIFY LAB WITHIN 5 DAYS.        LOWEST DETECTABLE LIMITS FOR URINE DRUG SCREEN Drug Class       Cutoff (ng/mL) Amphetamine  1000 Barbiturate      200 Benzodiazepine   200 Tricyclics       300 Opiates          300 Cocaine          300 THC              50   CBG monitoring, ED     Status: Abnormal   Collection Time: 05/09/17 12:15 PM  Result Value Ref Range   Glucose-Capillary 214 (H) 65 - 99 mg/dL  CBG monitoring, ED     Status: Abnormal   Collection Time: 05/09/17  5:34 PM  Result Value Ref Range   Glucose-Capillary 224 (H) 65 - 99 mg/dL  CBG monitoring, ED     Status: Abnormal   Collection Time: 05/09/17  9:00 PM  Result Value Ref Range   Glucose-Capillary 266 (H) 65 - 99 mg/dL  CBG monitoring, ED     Status:  Abnormal   Collection Time: 05/10/17  8:02 AM  Result Value Ref Range   Glucose-Capillary 207 (H) 65 - 99 mg/dL  CBG monitoring, ED     Status: Abnormal   Collection Time: 05/10/17  9:11 AM  Result Value Ref Range   Glucose-Capillary 259 (H) 65 - 99 mg/dL  CBG monitoring, ED     Status: Abnormal   Collection Time: 05/10/17 12:27 PM  Result Value Ref Range   Glucose-Capillary 239 (H) 65 - 99 mg/dL  CBG monitoring, ED     Status: Abnormal   Collection Time: 05/10/17  5:35 PM  Result Value Ref Range   Glucose-Capillary 270 (H) 65 - 99 mg/dL  CBG monitoring, ED     Status: Abnormal   Collection Time: 05/11/17  7:33 AM  Result Value Ref Range   Glucose-Capillary 236 (H) 65 - 99 mg/dL    Current Facility-Administered Medications  Medication Dose Route Frequency Provider Last Rate Last Dose  . acetaminophen (TYLENOL) tablet 650 mg  650 mg Oral Q4H PRN Garlon Hatchet, PA-C      . alum & mag hydroxide-simeth (MAALOX/MYLANTA) 200-200-20 MG/5ML suspension 30 mL  30 mL Oral Q6H PRN Garlon Hatchet, PA-C      . atorvastatin (LIPITOR) tablet 10 mg  10 mg Oral Daily Garlon Hatchet, PA-C   10 mg at 05/10/17 1610  . cloNIDine (CATAPRES) tablet 0.1 mg  0.1 mg Oral QAC breakfast Garlon Hatchet, PA-C   0.1 mg at 05/11/17 0751  . gabapentin (NEURONTIN) capsule 300 mg  300 mg Oral TID PRN Garlon Hatchet, PA-C   300 mg at 05/09/17 1640  . hydrOXYzine (ATARAX/VISTARIL) tablet 25 mg  25 mg Oral TID PRN Garlon Hatchet, PA-C   25 mg at 05/10/17 1153  . ibuprofen (ADVIL,MOTRIN) tablet 600 mg  600 mg Oral Q8H PRN Absher, Ky Barban, RPH      . insulin aspart (novoLOG) injection 0-15 Units  0-15 Units Subcutaneous TID WC Charlynne Pander, MD   5 Units at 05/11/17 [phone]  . insulin glargine (LANTUS) injection 15 Units  15 Units Subcutaneous Daily Garlon Hatchet, PA-C   15 Units at 05/10/17 0920  . linagliptin (TRADJENTA) tablet 5 mg  5 mg Oral Daily Garlon Hatchet, PA-C   5 mg at 05/10/17 0919  . loperamide  (IMODIUM) capsule 2 mg  2 mg Oral Q2H PRN Rankin, Shuvon B, NP      . mirtazapine (REMERON) tablet 7.5 mg  7.5 mg Oral QHS Sharilyn Sites  M, PA-C   7.5 mg at 05/10/17 2116  . nicotine (NICODERM CQ - dosed in mg/24 hours) patch 21 mg  21 mg Transdermal Daily Garlon Hatchet, PA-C   21 mg at 05/10/17 1308  . ondansetron (ZOFRAN) tablet 4 mg  4 mg Oral Q8H PRN Garlon Hatchet, PA-C   4 mg at 05/10/17 1149  . topiramate (TOPAMAX) tablet 50 mg  50 mg Oral BID Garlon Hatchet, PA-C   50 mg at 05/10/17 2116  . traZODone (DESYREL) tablet 50 mg  50 mg Oral QHS PRN Garlon Hatchet, PA-C   50 mg at 05/10/17 2354   Current Outpatient Prescriptions  Medication Sig Dispense Refill  . atorvastatin (LIPITOR) 10 MG tablet Take 1 tablet (10 mg total) by mouth daily. For high cholesterol 30 tablet 0  . cloNIDine (CATAPRES) 0.1 MG tablet Take 1 tablet (0.1 mg total) by mouth daily before breakfast. 60 tablet 11  . gabapentin (NEURONTIN) 300 MG capsule Take 1 capsule (300 mg total) by mouth 3 (three) times daily. For agitation (Patient taking differently: Take 300 mg by mouth 3 (three) times daily as needed. For pain) 90 capsule 0  . hydrOXYzine (VISTARIL) 25 MG capsule Take 25 mg by mouth 3 (three) times daily as needed for anxiety.    . insulin aspart (NOVOLOG) 100 UNIT/ML injection Inject 2-8 Units into the skin See admin instructions. Sliding scale.over 250--2 units over 300--4 units over 350--6 units over 400--8 units Up to 50 units daily: For diabetes management 10 mL 0  . insulin glargine (LANTUS) 100 UNIT/ML injection Inject 15 Units into the skin every morning.    . linagliptin (TRADJENTA) 5 MG TABS tablet Take 1 tablet (5 mg total) by mouth daily. 30 tablet 0  . mirtazapine (REMERON) 7.5 MG tablet Take 1 tablet (7.5 mg total) by mouth at bedtime. 30 tablet 0  . Soft Lens Products (SALINE SENSITIVE EYES) SOLN Place 1 drop into both eyes daily as needed. Dry eye    . topiramate (TOPAMAX) 50 MG tablet Take 1  tablet (50 mg total) by mouth 2 (two) times daily. 30 tablet 0  . traZODone (DESYREL) 50 MG tablet Take 1 tablet (50 mg total) by mouth at bedtime as needed for sleep. 30 tablet 0  . nicotine (NICODERM CQ - DOSED IN MG/24 HOURS) 21 mg/24hr patch Place 1 patch (21 mg total) onto the skin daily. For smoking cessation (Patient not taking: Reported on 05/09/2017) 28 patch 0    Musculoskeletal: Strength & Muscle Tone: within normal limits Gait & Station: normal Patient leans: N/A  Psychiatric Specialty Exam: Physical Exam  Constitutional: He is oriented to person, place, and time. He appears well-developed and well-nourished.  HENT:  Head: Normocephalic.  Neck: Normal range of motion.  Respiratory: Effort normal.  Musculoskeletal: Normal range of motion.  Neurological: He is alert and oriented to person, place, and time.  Psychiatric: He has a normal mood and affect. His speech is normal and behavior is normal. Judgment and thought content normal. Cognition and memory are normal.    Review of Systems  Psychiatric/Behavioral: Positive for substance abuse.  All other systems reviewed and are negative.   Blood pressure (!) 154/99, pulse 79, temperature 98.9 F (37.2 C), temperature source Oral, resp. rate 20, SpO2 98 %.There is no height or weight on file to calculate BMI.  General Appearance: Casual  Eye Contact:  Good  Speech:  Normal Rate  Volume:  Normal  Mood:  Euthymic  Affect:  Congruent  Thought Process:  Coherent and Descriptions of Associations: Intact  Orientation:  Full (Time, Place, and Person)  Thought Content:  WDL and Logical  Suicidal Thoughts:  No  Homicidal Thoughts:  No  Memory:  Immediate;   Good Recent;   Good Remote;   Good  Judgement:  Fair  Insight:  Fair  Psychomotor Activity:  Normal  Concentration:  Concentration: Good and Attention Span: Good  Recall:  Good  Fund of Knowledge:  Fair  Language:  Good  Akathisia:  No  Handed:  Right  AIMS (if  indicated):     Assets:  Leisure Time Physical Health Resilience  ADL's:  Intact  Cognition:  WNL  Sleep:        Treatment Plan Summary: Daily contact with patient to assess and evaluate symptoms and progress in treatment, Medication management and Plan cocaine abuse with cocaine induced mood disorder:  -Crisis stabilization -Medication management:  Medical medications continued along with Gabapentin 300 mg TID withdrawal symptoms, Vistaril 25 mg TID PRN anxiety, Remeron 7.5 mg at bedtime for sleep and depression, and Trazodone 50 mg at bedtime PRN sleep -Individual and substance abuse counseling -Rx and outpatient resources provided  Disposition: No evidence of imminent risk to self or others at present.    Nanine MeansLORD, JAMISON, NP 05/11/2017 9:37 AM  Patient seen face-to-face for psychiatric evaluation, chart reviewed and case discussed with the physician extender and developed treatment plan. Reviewed the information documented and agree with the treatment plan. Thedore MinsMojeed Nekesha Font, MD

## 2017-05-11 NOTE — [facility] Suicide Risk Assessment (Signed)
Suicide Risk Assessment  Discharge Assessment   [facility]   Principal Problem: Substance induced mood disorder [facility](HCC) Discharge Diagnoses:  Patient Active Problem List   Diagnosis Date Noted  . Substance induced mood disorder (HCC) [F19.94] 12/21/2016    Priority: High  . Polysubstance (including opioids) dependence w/o physiol dependence (HCC) [F19.20] 05/02/2016    Priority: High  . Suicidal ideation [R45.851]   . Opiate withdrawal (HCC) [F11.23] 12/21/2016  . Tinea cruris [B35.6] 12/21/2016  . 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 Elevated lipase [R74.8] 05/02/2016    Total Time spent with patient: 30 minutes   Musculoskeletal: Strength & Muscle Tone: within normal limits Gait & Station: normal Patient leans: N/A  Psychiatric Specialty Exam: Physical Exam  Constitutional: He is oriented to person, place, and time. He appears well-developed and well-nourished.  HENT:  Head: Normocephalic.  Neck: Normal range of motion.  Respiratory: Effort normal.  Musculoskeletal: Normal range of motion.  Neurological: He is alert and oriented to person, place, and time.  Psychiatric: He has a normal mood and affect. His speech is normal and behavior is normal. Judgment and thought content normal. Cognition and memory are normal.    Review of Systems  Psychiatric/Behavioral: Positive for substance abuse.  All other systems reviewed and are negative.   Blood pressure (!) 154/99, pulse 79, temperature 98.9 F (37.2 C), temperature source Oral, resp. rate 20, SpO2 98 %.There is no height or weight on file to calculate BMI.  General Appearance: Casual  Eye Contact:  Good  Speech:  Normal Rate  Volume:  Normal  Mood:  Euthymic  Affect:  Congruent  Thought Process:  Coherent and Descriptions of Associations: Intact  Orientation:  Full (Time, Place, and Person)  Thought Content:  WDL and Logical  Suicidal  Thoughts:  No  Homicidal Thoughts:  No  Memory:  Immediate;   Good Recent;   Good Remote;   Good  Judgement:  Fair  Insight:  Fair  Psychomotor Activity:  Normal  Concentration:  Concentration: Good and Attention Span: Good  Recall:  Good  Fund of Knowledge:  Fair  Language:  Good  Akathisia:  No  Handed:  Right  AIMS (if indicated):     Assets:  Leisure Time Physical Health Resilience  ADL's:  Intact  Cognition:  WNL  Sleep:      Mental Status Per Nursing Assessment::   On Admission:   substance abuse with depression  Demographic Factors:  Male and Caucasian  Loss Factors: Legal issues  Historical Factors: NA  Risk Reduction Factors:   Sense of responsibility to family  Continued Clinical Symptoms:  None  Cognitive Features That Contribute To Risk:  None    Suicide Risk:  Minimal: No identifiable suicidal ideation.  Patients presenting with no risk factors but with morbid ruminations; may be classified as minimal risk based on the severity of the depressive symptoms    Plan Of Care/Follow-up recommendations:  Activity:  asd tolerated Diet:  heart healthy diet  Ginger Leeth, NP 05/11/2017, 3:28 PM

## 2017-05-11 NOTE — ED Notes (Signed)
Hourly rounding reveals patient in room. No complaints, stable, in no acute distress. Q15 minute rounds and monitoring via Security Cameras to continue. 

## 2017-05-11 NOTE — ED Notes (Addendum)
Pt discharged ambulatory with mother.  Pt was agitated at discharge.  We could not get his vitals due to his agitation.  Patient was in no distress at discharge.  All belongings were returned to patient.  RX and resources were reviewed with patient.

## 2017-05-11 NOTE — Discharge Instructions (Signed)
To help you maintain a sober lifestyle, a substance abuse treatment program may be beneficial to you.  Contact one of the following facilities at your earliest opportunity to ask about enrolling: ° °RESIDENTIAL PROGRAMS: ° °     [facility] °     [address] °     [place], [place] [address] °     [phone] ° °     [facility] °     [address] °     [place], [place] [address] °     [phone] ° °     [facility] °     [address] °     [place], [place] [address] °     [phone] ° °OUTPATIENT PROGRAMS: ° °     [facility] °     [address] °     [place], [place] [address] °     [phone] °     New patients are seen at the walk-in clinic every Tuesday from 9:00 am - 12:00 pm. °

## 2017-05-15 ENCOUNTER — Encounter: Payer: Self-pay | Admitting: Emergency Medicine

## 2017-05-15 ENCOUNTER — Emergency Department
Admission: EM | Admit: 2017-05-15 | Discharge: 2017-05-16 | Disposition: A | Discharge status: Home / Self Care | Payer: Medicaid Other | Attending: Emergency Medicine | Admitting: Emergency Medicine

## 2017-05-15 DIAGNOSIS — I10 Essential (primary) hypertension: Secondary | ICD-10-CM | POA: Diagnosis not present

## 2017-05-15 DIAGNOSIS — Z79899 Other long term (current) drug therapy: Secondary | ICD-10-CM | POA: Diagnosis not present

## 2017-05-15 DIAGNOSIS — R739 Hyperglycemia, unspecified: Secondary | ICD-10-CM

## 2017-05-15 DIAGNOSIS — F17210 Nicotine dependence, cigarettes, uncomplicated: Secondary | ICD-10-CM | POA: Insufficient documentation

## 2017-05-15 DIAGNOSIS — Z794 Long term (current) use of insulin: Secondary | ICD-10-CM | POA: Diagnosis not present

## 2017-05-15 DIAGNOSIS — E1165 Type 2 diabetes mellitus with hyperglycemia: Secondary | ICD-10-CM | POA: Insufficient documentation

## 2017-05-15 LAB — CBC
HCT: 39.6 % — ABNORMAL LOW (ref 40.0–52.0)
Hemoglobin: 13.6 g/dL (ref 13.0–18.0)
MCH: 30.5 pg (ref 26.0–34.0)
MCHC: 34.5 g/dL (ref 32.0–36.0)
MCV: 88.5 fL (ref 80.0–100.0)
PLATELETS: 68 10*3/uL — AB (ref 150–440)
RBC: 4.47 MIL/uL (ref 4.40–5.90)
RDW: 14.1 % (ref 11.5–14.5)
WBC: 3.2 10*3/uL — AB (ref 3.8–10.6)

## 2017-05-15 LAB — BASIC METABOLIC PANEL
ANION GAP: 9 (ref 5–15)
BUN: 13 mg/dL (ref 6–20)
CHLORIDE: 107 mmol/L (ref 101–111)
CO2: 21 mmol/L — ABNORMAL LOW (ref 22–32)
CREATININE: 0.87 mg/dL (ref 0.61–1.24)
Calcium: 9.2 mg/dL (ref 8.9–10.3)
GFR calc non Af Amer: >60 mL/min (ref >60)
Glucose, Bld: 340 mg/dL — ABNORMAL HIGH (ref 65–99)
Potassium: 3.8 mmol/L (ref 3.5–5.1)
Sodium: 137 mmol/L (ref 135–145)

## 2017-05-15 LAB — GLUCOSE, CAPILLARY: Glucose-Capillary: 395 mg/dL — ABNORMAL HIGH (ref 65–99)

## 2017-05-15 MED ORDER — SODIUM CHLORIDE 0.9 % IV BOLUS (SEPSIS)
1000.0000 mL | Freq: Once | INTRAVENOUS | Status: AC
  Administered 2017-05-15: 1000 mL via INTRAVENOUS

## 2017-05-15 MED ORDER — SODIUM CHLORIDE 0.9 % IV BOLUS (SEPSIS): 1000.0000 mL | Freq: Once | INTRAVENOUS | Status: DC

## 2017-05-15 MED ORDER — INSULIN ASPART 100 UNIT/ML ~~LOC~~ SOLN
8.0000 [IU] | Freq: Once | SUBCUTANEOUS | Status: AC
  Administered 2017-05-15: 8 [IU] via SUBCUTANEOUS
  Filled 2017-05-15: qty 1

## 2017-05-15 NOTE — ED Notes (Signed)
Pt is alert and talking in complete sentences. Pt states he has pain all over. Pt also states that he is not being given his insulin the correct way and that is why it is so high. Pt states his sliding scale needs to be readjusted.

## 2017-05-15 NOTE — ED Notes (Signed)
Pt given diet sprite 

## 2017-05-15 NOTE — ED Triage Notes (Signed)
Pt in via POV from [facility], sent over due to blood sugar 410.  Pt states, "they are not controlling it right."  Vitals WDL.  NAD noted at this time.

## 2017-05-16 LAB — URINALYSIS, COMPLETE (UACMP) WITH MICROSCOPIC
BACTERIA UA: NONE SEEN
BILIRUBIN URINE: NEGATIVE
Glucose, UA: >=500 mg/dL — AB
Hgb urine dipstick: NEGATIVE
KETONES UR: NEGATIVE mg/dL
LEUKOCYTES UA: NEGATIVE
Nitrite: NEGATIVE
Protein, ur: NEGATIVE mg/dL
RBC / HPF: NONE SEEN RBC/hpf (ref 0–5)
SQUAMOUS EPITHELIAL / LPF: NONE SEEN
Specific Gravity, Urine: 1.018 (ref 1.005–1.030)
pH: 7 (ref 5.0–8.0)

## 2017-05-16 LAB — GLUCOSE, CAPILLARY: GLUCOSE-CAPILLARY: 271 mg/dL — AB (ref 65–99)

## 2017-05-16 NOTE — Discharge Instructions (Signed)
Please refer to the sliding scale I have written on the prescription, Please follow up to further stabilize blood sugars. Please decrease intake of high carbohydrate foods and high sugar foods

## 2017-05-16 NOTE — ED Notes (Signed)
Pt resting comfortably at this time.

## 2017-05-16 NOTE — ED Provider Notes (Signed)
[facility] Emergency Department Provider Note   ____________________________________________   First MD Initiated Contact with Patient 05/15/17 2310     (approximate)  I have reviewed the triage vital signs and the nursing notes.   HISTORY  Chief Complaint Hyperglycemia    HPI Chase Reed is a 51 y.o. male who comes into the hospital today with hyperglycemia. The patient was sent from the residential treatment facility because his blood sugars were high. The patient reports that the facility has not been appropriately treating his blood sugars. They obtained his sliding scale from an old box of medicine so he is not getting an appropriate amount of coverage. The patient reports that his sugars have been high and he has not gotten anything. Yesterday it was in the 240s and he received ridge. The patient does not have a specific primary care physician but reports at home he typically covers his high blood sugars were different medicines. He is also been eating more high carbohydrate foods than he is used to. He typically checks his sugars after he eats and treat it that way. He has been eating more rice, potatoes and bread. He is detoxing from opiates and has been there for the past 2 days. The patient is supposed to be at the treatment facility for the next 5 days. He was sent here for evaluation and a new sliding scale.The patient had a blood sugar of 410 prior to coming in.   Past Medical History:  Diagnosis Date  . Cirrhosis (HCC)   . Diabetes mellitus without complication (HCC)   . Hepatitis     Patient Active Problem List   Diagnosis Date Noted  . Suicidal ideation   . Opiate withdrawal (HCC) 12/21/2016  . Tinea cruris 12/21/2016  . Substance induced mood disorder (HCC) 12/21/2016  . Polysubstance (including opioids) dependence w/o physiol dependence (HCC) 05/02/2016  . 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    . Elevated lipase 05/02/2016    Past Surgical History:  Procedure Laterality Date  . LITHOTRIPSY      Prior to Admission medications   Medication Sig Start Date End Date Taking? Authorizing Provider  atorvastatin (LIPITOR) 10 MG tablet Take 1 tablet (10 mg total) by mouth daily. For high cholesterol 12/25/16  Yes Armandina Stammer I, NP  cloNIDine (CATAPRES) 0.1 MG tablet Take 1 tablet (0.1 mg total) by mouth daily before breakfast. 02/07/17  Yes Oneta Rack, NP  gabapentin (NEURONTIN) 300 MG capsule Take 1 capsule (300 mg total) by mouth 3 (three) times daily. For agitation 05/11/17  Yes Lord, Herminio Heads, NP  hydrOXYzine (VISTARIL) 25 MG capsule Take 1 capsule (25 mg total) by mouth 3 (three) times daily as needed for anxiety. 05/11/17  Yes Lord, Herminio Heads, NP  insulin aspart (NOVOLOG) 100 UNIT/ML injection Inject 2-8 Units into the skin See admin instructions. Sliding scale.over 250--2 units over 300--4 units over 350--6 units over 400--8 units Up to 50 units daily: For diabetes management 12/25/16  Yes Armandina Stammer I, NP  insulin glargine (LANTUS) 100 UNIT/ML injection Inject 15 Units into the skin every morning.   Yes [provider]  linagliptin (TRADJENTA) 5 MG TABS tablet Take 1 tablet (5 mg total) by mouth daily. 02/08/17  Yes Oneta Rack, NP  mirtazapine (REMERON) 7.5 MG tablet Take 1 tablet (7.5 mg total) by mouth at bedtime. 05/11/17  Yes Charm Rings, NP  topiramate (TOPAMAX) 50 MG tablet Take 1 tablet (50 mg  total) by mouth 2 (two) times daily. 05/11/17  Yes Charm Rings, NP  traZODone (DESYREL) 50 MG tablet Take 1 tablet (50 mg total) by mouth at bedtime as needed for sleep. 05/11/17  Yes Lord, Herminio Heads, NP  nicotine (NICODERM CQ - DOSED IN MG/24 HOURS) 21 mg/24hr patch Place 1 patch (21 mg total) onto the skin daily. For smoking cessation Patient not taking: Reported on 05/09/2017 12/26/16   Armandina Stammer I, NP  Soft Lens Products (SALINE SENSITIVE EYES) SOLN Place 1 drop  into both eyes daily as needed. Dry eye    [provider]    Allergies Patient has no known allergies.  No family history on file.  Social History Social History  Substance Use Topics  . Smoking status: Current Every Day Smoker    Packs/day: 0.50    Years: 30.00  . Smokeless tobacco: Former Neurosurgeon  . Alcohol use No    Review of Systems  Constitutional: hyperglycemia Eyes: No visual changes. ENT: No sore throat. Cardiovascular: Denies chest pain. Respiratory: Denies shortness of breath. Gastrointestinal: No abdominal pain.  No nausea, no vomiting.  No diarrhea.  No constipation. Genitourinary: Negative for dysuria. Musculoskeletal: Negative for back pain. Skin: Negative for rash. Neurological: Negative for headaches, focal weakness or numbness.   ____________________________________________   PHYSICAL EXAM:  VITAL SIGNS: ED Triage Vitals  Enc Vitals Group     BP 05/15/17 2240 137/87     Pulse Rate 05/15/17 2240 90     Resp 05/15/17 2240 18     Temp 05/15/17 2240 98 F (36.7 C)     Temp Source 05/15/17 2240 Oral     SpO2 05/15/17 2240 100 %     Weight 05/15/17 2241 192 lb (87.1 kg)     Height 05/15/17 2241 6' (1.829 m)     Head Circumference --      Peak Flow --      Pain Score 05/15/17 2240 0     Pain Loc --      Pain Edu? --      Excl. in GC? --     Constitutional: Alert and oriented. Well appearing and in no acute distress. Eyes: Conjunctivae are normal. PERRL. EOMI. Head: Atraumatic. Nose: No congestion/rhinnorhea. Mouth/Throat: Mucous membranes are moist.  Oropharynx non-erythematous. Cardiovascular: Normal rate, regular rhythm. Grossly normal heart sounds.  Good peripheral circulation. Respiratory: Normal respiratory effort.  No retractions. Lungs CTAB. Gastrointestinal: Soft and nontender. No distention. Positive bowel sounds Musculoskeletal: No lower extremity tenderness nor edema.   Neurologic:  Normal speech and language. Skin:  Skin  is warm, dry and intact.  Psychiatric: Mood and affect are normal. .  ____________________________________________   LABS (all labs ordered are listed, but only abnormal results are displayed)  Labs Reviewed  GLUCOSE, CAPILLARY - Abnormal; Notable for the following:       Result Value   Glucose-Capillary 395 (*)    All other components within normal limits  BASIC METABOLIC PANEL - Abnormal; Notable for the following:    CO2 21 (*)    Glucose, Bld 340 (*)    All other components within normal limits  CBC - Abnormal; Notable for the following:    WBC 3.2 (*)    HCT 39.6 (*)    Platelets 68 (*)    All other components within normal limits  URINALYSIS, COMPLETE (UACMP) WITH MICROSCOPIC - Abnormal; Notable for the following:    Color, Urine YELLOW (*)    APPearance CLEAR (*)  Glucose, UA >=500 (*)    All other components within normal limits  GLUCOSE, CAPILLARY - Abnormal; Notable for the following:    Glucose-Capillary 271 (*)    All other components within normal limits  CBG MONITORING, ED  CBG MONITORING, ED   ____________________________________________  EKG  none ____________________________________________  RADIOLOGY  No results found.  ____________________________________________   PROCEDURES  Procedure(s) performed: None  Procedures  Critical Care performed: No  ____________________________________________   INITIAL IMPRESSION / ASSESSMENT AND PLAN / ED COURSE  Pertinent labs & imaging results that were available during my care of the patient were reviewed by me and considered in my medical decision making (see chart for details).  This is a 51 year old male who comes into the hospital today with hyperglycemia. The patient is currently at the residential treatment facility and he was sent in to have his sliding scale evaluated. The patient's blood sugars were 340 when he initially arrived on his BMP. I gave the patient a liter of normal saline as  well as 8 units of NovoLog. I contacted the treatment facility and discussed the patient with the hospitalist. The decision was made to lower the patient's sliding scale. I did write a prescription to have the patient receive 1 unit of insulin for blood sugars that were 150-200, 3 units for blood sugars of 200-250, 5 units for blood sugar is 250-300 and so on and so forth. The patient will also check his sugars after he eats. I asked the facility if they had a physician who could help adjust the patient's sliding scale as needed. They report that they will contact their nurse practitioner to see if they can help adjust the patient's sliding scale. After the fluid in the medication the patient's blood sugars came down to 271. He will be discharged back to the treatment facility.      ____________________________________________   FINAL CLINICAL IMPRESSION(S) / ED DIAGNOSES  Final diagnoses:  Hyperglycemia      NEW MEDICATIONS STARTED DURING THIS VISIT:  Discharge Medication List as of 05/16/2017  1:06 AM       Note:  This document was prepared using Dragon voice recognition software and may include unintentional dictation errors.    Rebecka ApleyWebster, Allison P, MD 05/16/17 [phone]

## 2017-05-16 NOTE — ED Notes (Signed)
Call[place] and spoke with [place] from [facility] to notified her that the pt was up for discharge. Per [place] someone would be on their way.

## 2017-05-16 NOTE — ED Notes (Signed)
BS 271

## 2017-06-01 ENCOUNTER — Encounter (HOSPITAL_COMMUNITY): Payer: Self-pay | Admitting: Emergency Medicine

## 2017-06-01 ENCOUNTER — Emergency Department (HOSPITAL_COMMUNITY)
Admission: EM | Admit: 2017-06-01 | Discharge: 2017-06-01 | Disposition: A | Discharge status: Home / Self Care | Payer: Medicaid Other | Attending: Emergency Medicine | Admitting: Emergency Medicine

## 2017-06-01 DIAGNOSIS — F17210 Nicotine dependence, cigarettes, uncomplicated: Secondary | ICD-10-CM | POA: Insufficient documentation

## 2017-06-01 DIAGNOSIS — Z79899 Other long term (current) drug therapy: Secondary | ICD-10-CM | POA: Diagnosis not present

## 2017-06-01 DIAGNOSIS — Z794 Long term (current) use of insulin: Secondary | ICD-10-CM | POA: Insufficient documentation

## 2017-06-01 DIAGNOSIS — E119 Type 2 diabetes mellitus without complications: Secondary | ICD-10-CM | POA: Diagnosis not present

## 2017-06-01 DIAGNOSIS — I10 Essential (primary) hypertension: Secondary | ICD-10-CM

## 2017-06-01 MED ORDER — LISINOPRIL 10 MG PO TABS: 10.0000 mg | ORAL_TABLET | Freq: Every day | ORAL | 0 refills | Status: DC

## 2017-06-01 NOTE — ED Provider Notes (Signed)
[facility]-EMERGENCY DEPT Provider Note   CSN: [id number] Arrival date & time: 06/01/17  1158     History   Chief Complaint Chief Complaint  Patient presents with  . Hypertension    HPI Chase Reed is a 51 y.o. male.  51 yo M with a chief complaint of asymptomatic hypertension. This been an ongoing issue for at least the past few months. The patient is concerned and has been checking his blood pressure multiple times a day. He had a prescription for clonidine and he would check his blood pressure and then take a pill if he felt like it was high. He did this until he ran out of tablets. His prescription was for 0.1 mg in the morning. He denies ever being on other blood pressure medications. Denies headaches or change in vision. Denies unilateral weakness or numbness. Denies chest pain or shortness of breath. Denies lower extremity edema.    Hypertension  This is a new problem. The current episode started more than 1 week ago. The problem occurs constantly. The problem has not changed since onset.Pertinent negatives include no chest pain, no abdominal pain, no headaches and no shortness of breath. Nothing aggravates the symptoms. Nothing relieves the symptoms. He has tried nothing for the symptoms. The treatment provided no relief.    Past Medical History:  Diagnosis Date  . Cirrhosis (HCC)   . Diabetes mellitus without complication (HCC)   . Hepatitis     Patient Active Problem List   Diagnosis Date Noted  . Suicidal ideation   . Opiate withdrawal (HCC) 12/21/2016  . Tinea cruris 12/21/2016  . Substance induced mood disorder (HCC) 12/21/2016  . Polysubstance (including opioids) dependence w/o physiol dependence (HCC) 05/02/2016  . 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 Elevated lipase 05/02/2016    Past Surgical History:  Procedure Laterality Date  . LITHOTRIPSY         Home Medications    Prior to Admission medications     Medication Sig Start Date End Date Taking? Authorizing Provider  atorvastatin (LIPITOR) 10 MG tablet Take 1 tablet (10 mg total) by mouth daily. For high cholesterol 12/25/16   Armandina StammerNwoko, Agnes I, NP  cloNIDine (CATAPRES) 0.1 MG tablet Take 1 tablet (0.1 mg total) by mouth daily before breakfast. 02/07/17   Oneta RackLewis, Tanika N, NP  gabapentin (NEURONTIN) 300 MG capsule Take 1 capsule (300 mg total) by mouth 3 (three) times daily. For agitation 05/11/17   Charm RingsLord, Jamison Y, NP  hydrOXYzine (VISTARIL) 25 MG capsule Take 1 capsule (25 mg total) by mouth 3 (three) times daily as needed for anxiety. 05/11/17   Charm RingsLord, Jamison Y, NP  insulin aspart (NOVOLOG) 100 UNIT/ML injection Inject 2-8 Units into the skin See admin instructions. Sliding scale.over 250--2 units over 300--4 units over 350--6 units over 400--8 units Up to 50 units daily: For diabetes management 12/25/16   Armandina StammerNwoko, Agnes I, NP  insulin glargine (LANTUS) 100 UNIT/ML injection Inject 15 Units into the skin every morning.    [provider]  linagliptin (TRADJENTA) 5 MG TABS tablet Take 1 tablet (5 mg total) by mouth daily. 02/08/17   Oneta RackLewis, Tanika N, NP  lisinopril (PRINIVIL,ZESTRIL) 10 MG tablet Take 1 tablet (10 mg total) by mouth daily. 06/01/17   Melene PlanFloyd, Shareeka Yim, DO  mirtazapine (REMERON) 7.5 MG tablet Take 1 tablet (7.5 mg total) by mouth at bedtime. 05/11/17   Charm RingsLord, Jamison Y, NP  nicotine (NICODERM CQ - DOSED IN MG/24 HOURS) 21 mg/24hr  patch Place 1 patch (21 mg total) onto the skin daily. For smoking cessation Patient not taking: Reported on 05/09/2017 12/26/16   Armandina Stammer I, NP  Soft Lens Products (SALINE SENSITIVE EYES) SOLN Place 1 drop into both eyes daily as needed. Dry eye    [provider]  topiramate (TOPAMAX) 50 MG tablet Take 1 tablet (50 mg total) by mouth 2 (two) times daily. 05/11/17   Charm Rings, NP  traZODone (DESYREL) 50 MG tablet Take 1 tablet (50 mg total) by mouth at bedtime as needed for sleep. 05/11/17   Charm Rings, NP    Family History No family history on file.  Social History Social History  Substance Use Topics  . Smoking status: Current Every Day Smoker    Packs/day: 0.50    Years: 30.00  . Smokeless tobacco: Former Neurosurgeon  . Alcohol use No     Allergies   Patient has no known allergies.   Review of Systems Review of Systems  Constitutional: Negative for chills and fever.  HENT: Negative for congestion and facial swelling.   Eyes: Negative for discharge and visual disturbance.  Respiratory: Negative for shortness of breath.   Cardiovascular: Negative for chest pain and palpitations.  Gastrointestinal: Negative for abdominal pain, diarrhea and vomiting.  Musculoskeletal: Negative for arthralgias and myalgias.  Skin: Negative for color change and rash.  Neurological: Negative for tremors, syncope and headaches.  Psychiatric/Behavioral: Negative for confusion and dysphoric mood.     Physical Exam Updated Vital Signs BP (!) 142/107 (BP Location: Left Arm)   Pulse 97   Temp 98.5 F (36.9 C) (Oral)   Resp 17   Ht 6' (1.829 m)   Wt 87.1 kg (192 lb)   SpO2 98%   BMI 26.04 kg/m   Physical Exam  Constitutional: He is oriented to person, place, and time. He appears well-developed and well-nourished.  HENT:  Head: Normocephalic and atraumatic.  Eyes: Pupils are equal, round, and reactive to light. EOM are normal.  Neck: Normal range of motion. Neck supple. No JVD present.  Cardiovascular: Normal rate and regular rhythm.  Exam reveals no gallop and no friction rub.   No murmur heard. Pulmonary/Chest: No respiratory distress. He has no wheezes.  Abdominal: He exhibits no distension. There is no rebound and no guarding.  Musculoskeletal: Normal range of motion.  Neurological: He is alert and oriented to person, place, and time. He has normal strength. No cranial nerve deficit or sensory deficit. He displays a negative Romberg sign. Coordination and gait normal. GCS eye subscore  is 4. GCS verbal subscore is 5. GCS motor subscore is 6. He displays no Babinski's sign on the right side. He displays no Babinski's sign on the left side.  Reflex Scores:      Tricep reflexes are 2+ on the right side and 2+ on the left side.      Bicep reflexes are 2+ on the right side and 2+ on the left side.      Brachioradialis reflexes are 2+ on the right side and 2+ on the left side.      Patellar reflexes are 2+ on the right side and 2+ on the left side.      Achilles reflexes are 2+ on the right side and 2+ on the left side. Skin: No rash noted. No pallor.  Psychiatric: He has a normal mood and affect. His behavior is normal.  Nursing note and vitals reviewed.    ED Treatments /  Results  Labs (all labs ordered are listed, but only abnormal results are displayed) Labs Reviewed - No data to display  EKG  EKG Interpretation None       Radiology No results found.  Procedures Procedures (including critical care time)  Medications Ordered in ED Medications - No data to display   Initial Impression / Assessment and Plan / ED Course  I have reviewed the triage vital signs and the nursing notes.  Pertinent labs & imaging results that were available during my care of the patient were reviewed by me and considered in my medical decision making (see chart for details).     51 yo M With a asymptomatic hypertension. He has a scheduled appointment in about 2 weeks' time to see his PCP for the first time to address this. He is here because his family doctor sent him to get a prescription in the meanwhile. I do not feel that clonidine is the appropriate first line medication to give him for this. With his underlying diabetes and feel that lisinopril would be a good option. I discussed this at length with the patient including that I am not trained in managing patients blood pressure primarily. I discussed that he does need follow-up with his family physician. I told him he should not  do multiple doses of this medication. Strict return precautions.  2:51 PM:  I have discussed the diagnosis/risks/treatment options with the patient and believe the pt to be eligible for discharge home to follow-up with PCP. We also discussed returning to the ED immediately if new or worsening sx occur. We discussed the sx which are most concerning (e.g., chest pain, sob, stroke s/s, angioedema s/sx) that necessitate immediate return. Medications administered to the patient during their visit and any new prescriptions provided to the patient are listed below.  Medications given during this visit Medications - No data to display   The patient appears reasonably screen and/or stabilized for discharge and I doubt any other medical condition or other [facility] requiring further screening, evaluation, or treatment in the ED at this time prior to discharge.    Final Clinical Impressions(s) / ED Diagnoses   Final diagnoses:  Essential hypertension    New Prescriptions Current Discharge Medication List    START taking these medications   Details  lisinopril (PRINIVIL,ZESTRIL) 10 MG tablet Take 1 tablet (10 mg total) by mouth daily. Qty: 30 tablet, Refills: 0         Melene PlanFloyd, Davan Hark, DO 06/01/17 1451

## 2017-06-01 NOTE — Discharge Instructions (Signed)
Check your blood pressure just once a day. Do not take more than 1 dose of this medicine a day. Follow up with your family physician as scheduled. Return if you have apical to speech and weakness on one side of your body or the other, or numbness. Chest pain or shortness of breath.  There is a known reaction with this can make your face lips and tongue swell. If this occurs he need to come back to the emergency department as well. You will likely need to have your labs checked on your first visit with your PCP.

## 2017-06-01 NOTE — ED Triage Notes (Signed)
Pt. Stated, I have hypertension and I have an appt. On Aug. 14 and I just need help with the BP medicine needs to be refilled and my BP is staying high.

## 2017-06-11 NOTE — ED Provider Notes (Signed)
[facility]-EMERGENCY DEPT Provider Note   CSN: [id number] Arrival date & time: 05/08/17  2313     History   Chief Complaint Chief Complaint  Patient presents with  . Suicidal  . Medical Clearance    HPI Chase Reed is a 51 y.o. male.  HPI  51 y.o., male presents to the ER with complaints of worsening depression and seeking help related to his drug abuse (opiates/cocaine).  Patient reports that he continues to be depressed and is having SI. Patient denies homicidal ideation, or paranoia.  At this time he also denies auditory hallucinations.    Pt denies nausea, emesis, fevers, chills, chest pains, shortness of breath, headaches, abdominal pain, uti like symptoms.   Past Medical History:  Diagnosis Date  . Cirrhosis (HCC)   . Diabetes mellitus without complication (HCC)   . Hepatitis     Patient Active Problem List   Diagnosis Date Noted  . Suicidal ideation   . Opiate withdrawal (HCC) 12/21/2016  . Tinea cruris 12/21/2016  . Substance induced mood disorder (HCC) 12/21/2016  . Polysubstance (including opioids) dependence w/o physiol dependence (HCC) 05/02/2016  . 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 Elevated lipase 05/02/2016    Past Surgical History:  Procedure Laterality Date  . LITHOTRIPSY         Home Medications    Prior to Admission medications   Medication Sig Start Date End Date Taking? Authorizing Provider  atorvastatin (LIPITOR) 10 MG tablet Take 1 tablet (10 mg total) by mouth daily. For high cholesterol 12/25/16  Yes Armandina Stammer I, NP  cloNIDine (CATAPRES) 0.1 MG tablet Take 1 tablet (0.1 mg total) by mouth daily before breakfast. 02/07/17  Yes Oneta Rack, NP  insulin aspart (NOVOLOG) 100 UNIT/ML injection Inject 2-8 Units into the skin See admin instructions. Sliding scale.over 250--2 units over 300--4 units over 350--6 units over 400--8 units Up to 50 units daily: For diabetes management 12/25/16  Yes  Armandina Stammer I, NP  insulin glargine (LANTUS) 100 UNIT/ML injection Inject 15 Units into the skin every morning.   Yes [provider]  linagliptin (TRADJENTA) 5 MG TABS tablet Take 1 tablet (5 mg total) by mouth daily. 02/08/17  Yes Oneta Rack, NP  Soft Lens Products (SALINE SENSITIVE EYES) SOLN Place 1 drop into both eyes daily as needed. Dry eye   Yes [provider]  gabapentin (NEURONTIN) 300 MG capsule Take 1 capsule (300 mg total) by mouth 3 (three) times daily. For agitation 05/11/17   Charm Rings, NP  hydrOXYzine (VISTARIL) 25 MG capsule Take 1 capsule (25 mg total) by mouth 3 (three) times daily as needed for anxiety. 05/11/17   Charm Rings, NP  lisinopril (PRINIVIL,ZESTRIL) 10 MG tablet Take 1 tablet (10 mg total) by mouth daily. 06/01/17   Melene Plan, DO  mirtazapine (REMERON) 7.5 MG tablet Take 1 tablet (7.5 mg total) by mouth at bedtime. 05/11/17   Charm Rings, NP  nicotine (NICODERM CQ - DOSED IN MG/24 HOURS) 21 mg/24hr patch Place 1 patch (21 mg total) onto the skin daily. For smoking cessation Patient not taking: Reported on 05/09/2017 12/26/16   Armandina Stammer I, NP  topiramate (TOPAMAX) 50 MG tablet Take 1 tablet (50 mg total) by mouth 2 (two) times daily. 05/11/17   Charm Rings, NP  traZODone (DESYREL) 50 MG tablet Take 1 tablet (50 mg total) by mouth at bedtime as needed for sleep. 05/11/17   Charm Rings,  NP    Family History History reviewed. No pertinent family history.  Social History Social History  Substance Use Topics  . Smoking status: Current Every Day Smoker    Packs/day: 0.50    Years: 30.00  . Smokeless tobacco: Former Neurosurgeon  . Alcohol use No     Allergies   Patient has no known allergies.   Review of Systems Review of Systems  All other systems reviewed and are negative.    Physical Exam Updated Vital Signs BP (!) 154/99 (BP Location: Left Arm) Comment: RN notified  Pulse 79   Temp 98.9 F (37.2 C) (Oral)   Resp 20    SpO2 98%   Physical Exam  Constitutional: He is oriented to person, place, and time. He appears well-developed.  HENT:  Head: Atraumatic.  Neck: Neck supple.  Cardiovascular: Normal rate.   Pulmonary/Chest: Effort normal.  Neurological: He is alert and oriented to person, place, and time.  Skin: Skin is warm.  Psychiatric:  depressed  Nursing note and vitals reviewed.    ED Treatments / Results  Labs (all labs ordered are listed, but only abnormal results are displayed) Labs Reviewed  COMPREHENSIVE METABOLIC PANEL - Abnormal; Notable for the following:       Result Value   Glucose, Bld 294 (*)    Total Bilirubin 0.2 (*)    All other components within normal limits  ACETAMINOPHEN LEVEL - Abnormal; Notable for the following:    Acetaminophen (Tylenol), Serum <10 (*)    All other components within normal limits  CBC - Abnormal; Notable for the following:    WBC 3.3 (*)    RBC 4.04 (*)    Hemoglobin 12.5 (*)    HCT 34.4 (*)    MCHC 36.3 (*)    Platelets 52 (*)    All other components within normal limits  RAPID URINE DRUG SCREEN, HOSP PERFORMED - Abnormal; Notable for the following:    Opiates POSITIVE (*)    Cocaine POSITIVE (*)    All other components within normal limits  CBG MONITORING, ED - Abnormal; Notable for the following:    Glucose-Capillary 156 (*)    All other components within normal limits  CBG MONITORING, ED - Abnormal; Notable for the following:    Glucose-Capillary 214 (*)    All other components within normal limits  CBG MONITORING, ED - Abnormal; Notable for the following:    Glucose-Capillary 224 (*)    All other components within normal limits  CBG MONITORING, ED - Abnormal; Notable for the following:    Glucose-Capillary 266 (*)    All other components within normal limits  CBG MONITORING, ED - Abnormal; Notable for the following:    Glucose-Capillary 207 (*)    All other components within normal limits  CBG MONITORING, ED - Abnormal;  Notable for the following:    Glucose-Capillary 259 (*)    All other components within normal limits  CBG MONITORING, ED - Abnormal; Notable for the following:    Glucose-Capillary 239 (*)    All other components within normal limits  CBG MONITORING, ED - Abnormal; Notable for the following:    Glucose-Capillary 270 (*)    All other components within normal limits  CBG MONITORING, ED - Abnormal; Notable for the following:    Glucose-Capillary 236 (*)    All other components within normal limits  ETHANOL  SALICYLATE LEVEL    EKG  EKG Interpretation None       Radiology No  results found.  Procedures Procedures (including critical care time)  Medications Ordered in ED Medications  loperamide (IMODIUM) capsule 4 mg (4 mg Oral Given 05/10/17 1149)     Initial Impression / Assessment and Plan / ED Course  I have reviewed the triage vital signs and the nursing notes.  Pertinent labs & imaging results that were available during my care of the patient were reviewed by me and considered in my medical decision making (see chart for details).    Pt comes in with cc of SI. Plan is to get psych on board. Medically cleared.  Final Clinical Impressions(s) / ED Diagnoses   Final diagnoses:  Suicidal ideation  Substance induced mood disorder (HCC)  Polysubstance (including opioids) dependence w/o physiol dependence [facility](HCC)    New Prescriptions Discharge Medication List as of 05/11/2017 10:00 AM       Derwood KaplanNanavati, Caprice Mccaffrey, MD 06/11/17 0900

## 2018-08-05 ENCOUNTER — Other Ambulatory Visit: Payer: Self-pay | Admitting: Physician Assistant

## 2018-08-05 DIAGNOSIS — K7469 Other cirrhosis of liver: Secondary | ICD-10-CM

## 2018-08-10 ENCOUNTER — Other Ambulatory Visit: Payer: Self-pay

## 2018-09-14 MED FILL — SUBOXONE 8 MG-2 MG SL FILM: 8-2 | 7 days supply | Qty: 21 | Fill #0

## 2018-10-06 ENCOUNTER — Encounter (HOSPITAL_COMMUNITY): Payer: Self-pay | Admitting: Emergency Medicine

## 2018-10-06 ENCOUNTER — Other Ambulatory Visit: Payer: Self-pay

## 2018-10-06 ENCOUNTER — Emergency Department (HOSPITAL_COMMUNITY)
Admission: EM | Admit: 2018-10-06 | Discharge: 2018-10-06 | Disposition: A | Discharge status: Home / Self Care | Payer: Medicaid Other | Attending: Emergency Medicine | Admitting: Emergency Medicine

## 2018-10-06 DIAGNOSIS — Z794 Long term (current) use of insulin: Secondary | ICD-10-CM | POA: Diagnosis not present

## 2018-10-06 DIAGNOSIS — E119 Type 2 diabetes mellitus without complications: Secondary | ICD-10-CM | POA: Insufficient documentation

## 2018-10-06 DIAGNOSIS — Z79899 Other long term (current) drug therapy: Secondary | ICD-10-CM | POA: Diagnosis not present

## 2018-10-06 DIAGNOSIS — T402X1A Poisoning by other opioids, accidental (unintentional), initial encounter: Secondary | ICD-10-CM | POA: Diagnosis present

## 2018-10-06 DIAGNOSIS — F17200 Nicotine dependence, unspecified, uncomplicated: Secondary | ICD-10-CM | POA: Diagnosis not present

## 2018-10-06 MED ORDER — ONDANSETRON 8 MG PO TBDP: 8.0000 mg | ORAL_TABLET | Freq: Three times a day (TID) | ORAL | 0 refills | Status: DC | PRN

## 2018-10-06 MED ORDER — ONDANSETRON HCL 4 MG/2ML IJ SOLN
4.0000 mg | Freq: Once | INTRAMUSCULAR | Status: AC
  Administered 2018-10-06: 4 mg via INTRAVENOUS
  Filled 2018-10-06: qty 2

## 2018-10-06 MED ORDER — CLONIDINE HCL 0.2 MG PO TABS
0.2000 mg | ORAL_TABLET | Freq: Once | ORAL | Status: AC
  Administered 2018-10-06: 0.2 mg via ORAL
  Filled 2018-10-06: qty 1

## 2018-10-06 MED ORDER — METOCLOPRAMIDE HCL 5 MG/ML IJ SOLN
10.0000 mg | Freq: Once | INTRAMUSCULAR | Status: AC
  Administered 2018-10-06: 10 mg via INTRAVENOUS
  Filled 2018-10-06: qty 2

## 2018-10-06 MED ORDER — CLONIDINE HCL 0.1 MG PO TABS: 0.2000 mg | ORAL_TABLET | Freq: Four times a day (QID) | ORAL | 0 refills | Status: DC | PRN

## 2018-10-06 NOTE — ED Notes (Signed)
Doctor at bedside. Patient states wants to get clean.
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
## 2018-10-08 NOTE — ED Provider Notes (Signed)
[facility] EMERGENCY DEPARTMENT Provider Note   CSN: [id number] Arrival date & time: 10/06/18  1338     History   Chief Complaint Chief Complaint  Patient presents with  . Drug Overdose  . Nausea  . Emesis    HPI Chase Reed is a 52 y.o. male.  HPI 52 year old male presents the emergency department after an unintentional drug overdose of opioid medications.  He was using fentanyl.  He became apneic.  EMS was called.  He was given 2 mg of Narcan with return of respiratory drive.  Initially EMS was supporting his ventilation but on arrival the emergency department he is with nausea and emesis and is breathing spontaneously on his own.  He states he was attempting to get high.  This was an unintentional overdose.   Past Medical History:  Diagnosis Date  . Cirrhosis (HCC)   . Diabetes mellitus without complication (HCC)   . Hepatitis     Patient Active Problem List   Diagnosis Date Noted  . Suicidal ideation   . Opiate withdrawal (HCC) 12/21/2016  . Tinea cruris 12/21/2016  . Substance induced mood disorder (HCC) 12/21/2016  . Polysubstance (including opioids) dependence w/o physiol dependence (HCC) 05/02/2016  . 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 Elevated lipase 05/02/2016    Past Surgical History:  Procedure Laterality Date  . LITHOTRIPSY          Home Medications    Prior to Admission medications   Medication Sig Start Date End Date Taking? Authorizing Provider  atorvastatin (LIPITOR) 10 MG tablet Take 1 tablet (10 mg total) by mouth daily. For high cholesterol 12/25/16   Armandina Stammer I, NP  cloNIDine (CATAPRES) 0.1 MG tablet Take 2 tablets (0.2 mg total) by mouth every 6 (six) hours as needed (withdrawl symptoms). 10/06/18   Azalia Bilis, MD  gabapentin (NEURONTIN) 300 MG capsule Take 1 capsule (300 mg total) by mouth 3 (three) times daily. For agitation 05/11/17   Charm Rings, NP  hydrOXYzine  (VISTARIL) 25 MG capsule Take 1 capsule (25 mg total) by mouth 3 (three) times daily as needed for anxiety. 05/11/17   Charm Rings, NP  insulin aspart (NOVOLOG) 100 UNIT/ML injection Inject 2-8 Units into the skin See admin instructions. Sliding scale.over 250--2 units over 300--4 units over 350--6 units over 400--8 units Up to 50 units daily: For diabetes management 12/25/16   Armandina Stammer I, NP  insulin glargine (LANTUS) 100 UNIT/ML injection Inject 15 Units into the skin every morning.    [provider]  linagliptin (TRADJENTA) 5 MG TABS tablet Take 1 tablet (5 mg total) by mouth daily. 02/08/17   Oneta Rack, NP  lisinopril (PRINIVIL,ZESTRIL) 10 MG tablet Take 1 tablet (10 mg total) by mouth daily. 06/01/17   Melene Plan, DO  mirtazapine (REMERON) 7.5 MG tablet Take 1 tablet (7.5 mg total) by mouth at bedtime. 05/11/17   Charm Rings, NP  nicotine (NICODERM CQ - DOSED IN MG/24 HOURS) 21 mg/24hr patch Place 1 patch (21 mg total) onto the skin daily. For smoking cessation Patient not taking: Reported on 05/09/2017 12/26/16   Armandina Stammer I, NP  ondansetron (ZOFRAN ODT) 8 MG disintegrating tablet Take 1 tablet (8 mg total) by mouth every 8 (eight) hours as needed for nausea or vomiting. 10/06/18   Azalia Bilis, MD  Soft Lens Products (SALINE SENSITIVE EYES) SOLN Place 1 drop into both eyes daily as needed. Dry eye  [provider]  topiramate (TOPAMAX) 50 MG tablet Take 1 tablet (50 mg total) by mouth 2 (two) times daily. 05/11/17   Charm RingsLord, Jamison Y, NP  traZODone (DESYREL) 50 MG tablet Take 1 tablet (50 mg total) by mouth at bedtime as needed for sleep. 05/11/17   Charm RingsLord, Jamison Y, NP    Family History No family history on file.  Social History Social History   Tobacco Use  . Smoking status: Current Every Day Smoker    Packs/day: 0.50    Years: 30.00    Pack years: 15.00  . Smokeless tobacco: Former Engineer, waterUser  Substance Use Topics  . Alcohol use: No  . Drug use: Yes     Types: Heroin, IV    Comment: Fentanyl and "Oxy" per patient      Allergies   Patient has no known allergies.   Review of Systems Review of Systems  All other systems reviewed and are negative.    Physical Exam Updated Vital Signs BP 132/75   Pulse 71   Temp 98.7 F (37.1 C) (Oral)   Resp 12   Ht 6' (1.829 m)   Wt 83.9 kg   SpO2 96%   BMI 25.09 kg/m   Physical Exam  Constitutional: He is oriented to person, place, and time. He appears well-developed and well-nourished.  HENT:  Head: Normocephalic and atraumatic.  Eyes: EOM are normal.  Neck: Normal range of motion.  Cardiovascular: Normal rate, regular rhythm, normal heart sounds and intact distal pulses.  Pulmonary/Chest: Effort normal and breath sounds normal. No respiratory distress.  Abdominal: Soft. He exhibits no distension. There is no tenderness.  Musculoskeletal: Normal range of motion.  Neurological: He is alert and oriented to person, place, and time.  Skin: Skin is warm and dry.  Psychiatric: He has a normal mood and affect. Judgment normal.  Nursing note and vitals reviewed.    ED Treatments / Results  Labs (all labs ordered are listed, but only abnormal results are displayed) Labs Reviewed - No data to display  EKG EKG Interpretation  Date/Time:  Wednesday October 06 2018 13:49:27 EST Ventricular Rate:  90 PR Interval:    QRS Duration: 74 QT Interval:  367 QTC Calculation: 449 R Axis:   70 Text Interpretation:  Sinus rhythm Normal ECG No significant change since last tracing Confirmed by Gwyneth SproutPlunkett, Whitney ([id number]) on 10/07/2018 10:48:30 PM   Radiology No results found.  Procedures Procedures (including critical care time)  Medications Ordered in ED Medications  ondansetron (ZOFRAN) injection 4 mg (4 mg Intravenous Given 10/06/18 1359)  metoCLOPramide (REGLAN) injection 10 mg (10 mg Intravenous Given 10/06/18 1402)  cloNIDine (CATAPRES) tablet 0.2 mg (0.2 mg Oral Given 10/06/18 1407)       Initial Impression / Assessment and Plan / ED Course  I have reviewed the triage vital signs and the nursing notes.  Pertinent labs & imaging results that were available during my care of the patient were reviewed by me and considered in my medical decision making (see chart for details).     Patient presents the emergency department and opioid withdrawal from the Narcan given by EMS.  He was observed in the emergency department.  He is required no repeat Narcan.  He is given clonidine for his withdrawal symptoms as well as antiemetics.  He feels much better.  Discharged home in good condition.  Final Clinical Impressions(s) / ED Diagnoses   Final diagnoses:  Opioid overdose, accidental or unintentional, initial encounter (HCC)  ED Discharge Orders         Ordered    cloNIDine (CATAPRES) 0.1 MG tablet  Every 6 hours PRN     10/06/18 1534    ondansetron (ZOFRAN ODT) 8 MG disintegrating tablet  Every 8 hours PRN     10/06/18 1534           Azalia Bilis, MD 10/08/18 0102

## 2018-10-21 MED FILL — SUBOXONE 8 MG-2 MG SL FILM: 8-2 | 14 days supply | Qty: 42 | Fill #0

## 2018-11-11 MED FILL — SUBOXONE 8 MG-2 MG SL FILM: 8-2 | 14 days supply | Qty: 42 | Fill #0

## 2018-12-02 MED FILL — SUBOXONE 8 MG-2 MG SL FILM: 8-2 | 14 days supply | Qty: 42 | Fill #0

## 2018-12-21 MED FILL — SUBOXONE 8 MG-2 MG SL FILM: 8-2 | 14 days supply | Qty: 42 | Fill #0

## 2018-12-23 ENCOUNTER — Emergency Department (HOSPITAL_COMMUNITY): Payer: Medicaid Other

## 2018-12-23 ENCOUNTER — Inpatient Hospital Stay (HOSPITAL_COMMUNITY)
Admission: EM | Admit: 2018-12-23 | Discharge: 2019-01-25 | DRG: 166 | Disposition: A | Discharge status: Home Health | Payer: Medicaid Other | Attending: Thoracic Surgery (Cardiothoracic Vascular Surgery) | Admitting: Thoracic Surgery (Cardiothoracic Vascular Surgery)

## 2018-12-23 ENCOUNTER — Other Ambulatory Visit: Payer: Self-pay

## 2018-12-23 DIAGNOSIS — K766 Portal hypertension: Secondary | ICD-10-CM | POA: Diagnosis present

## 2018-12-23 DIAGNOSIS — E1169 Type 2 diabetes mellitus with other specified complication: Secondary | ICD-10-CM | POA: Diagnosis present

## 2018-12-23 DIAGNOSIS — B182 Chronic viral hepatitis C: Secondary | ICD-10-CM | POA: Diagnosis present

## 2018-12-23 DIAGNOSIS — K3189 Other diseases of stomach and duodenum: Secondary | ICD-10-CM | POA: Diagnosis present

## 2018-12-23 DIAGNOSIS — F17210 Nicotine dependence, cigarettes, uncomplicated: Secondary | ICD-10-CM | POA: Diagnosis present

## 2018-12-23 DIAGNOSIS — Z79899 Other long term (current) drug therapy: Secondary | ICD-10-CM

## 2018-12-23 DIAGNOSIS — F1910 Other psychoactive substance abuse, uncomplicated: Secondary | ICD-10-CM | POA: Diagnosis present

## 2018-12-23 DIAGNOSIS — Z23 Encounter for immunization: Secondary | ICD-10-CM

## 2018-12-23 DIAGNOSIS — R7881 Bacteremia: Secondary | ICD-10-CM | POA: Diagnosis present

## 2018-12-23 DIAGNOSIS — I10 Essential (primary) hypertension: Secondary | ICD-10-CM | POA: Diagnosis present

## 2018-12-23 DIAGNOSIS — Z87442 Personal history of urinary calculi: Secondary | ICD-10-CM

## 2018-12-23 DIAGNOSIS — B356 Tinea cruris: Secondary | ICD-10-CM | POA: Diagnosis present

## 2018-12-23 DIAGNOSIS — I330 Acute and subacute infective endocarditis: Secondary | ICD-10-CM | POA: Diagnosis present

## 2018-12-23 DIAGNOSIS — I313 Pericardial effusion (noninflammatory): Secondary | ICD-10-CM | POA: Diagnosis present

## 2018-12-23 DIAGNOSIS — M868X8 Other osteomyelitis, other site: Secondary | ICD-10-CM | POA: Diagnosis present

## 2018-12-23 DIAGNOSIS — K7460 Unspecified cirrhosis of liver: Secondary | ICD-10-CM | POA: Diagnosis present

## 2018-12-23 DIAGNOSIS — J853 Abscess of mediastinum: Principal | ICD-10-CM | POA: Diagnosis present

## 2018-12-23 DIAGNOSIS — E1140 Type 2 diabetes mellitus with diabetic neuropathy, unspecified: Secondary | ICD-10-CM | POA: Diagnosis present

## 2018-12-23 DIAGNOSIS — D61818 Other pancytopenia: Secondary | ICD-10-CM | POA: Diagnosis not present

## 2018-12-23 DIAGNOSIS — T361X5A Adverse effect of cephalosporins and other beta-lactam antibiotics, initial encounter: Secondary | ICD-10-CM | POA: Diagnosis not present

## 2018-12-23 DIAGNOSIS — Z794 Long term (current) use of insulin: Secondary | ICD-10-CM

## 2018-12-23 DIAGNOSIS — L02213 Cutaneous abscess of chest wall: Secondary | ICD-10-CM | POA: Diagnosis present

## 2018-12-23 DIAGNOSIS — B9561 Methicillin susceptible Staphylococcus aureus infection as the cause of diseases classified elsewhere: Secondary | ICD-10-CM

## 2018-12-23 LAB — CBC WITH DIFFERENTIAL/PLATELET
Abs Immature Granulocytes: 0.06 10*3/uL (ref 0.00–0.07)
Basophils Absolute: 0.1 10*3/uL (ref 0.0–0.1)
Basophils Relative: 1 %
Eosinophils Absolute: 0 10*3/uL (ref 0.0–0.5)
Eosinophils Relative: 0 %
HCT: 35.8 % — ABNORMAL LOW (ref 39.0–52.0)
Hemoglobin: 11.8 g/dL — ABNORMAL LOW (ref 13.0–17.0)
Immature Granulocytes: 1 %
Lymphocytes Relative: 7 %
Lymphs Abs: 0.6 10*3/uL — ABNORMAL LOW (ref 0.7–4.0)
MCH: 28.9 pg (ref 26.0–34.0)
MCHC: 33 g/dL (ref 30.0–36.0)
MCV: 87.5 fL (ref 80.0–100.0)
Monocytes Absolute: 0.5 10*3/uL (ref 0.1–1.0)
Monocytes Relative: 6 %
NEUTROS ABS: 7.4 10*3/uL (ref 1.7–7.7)
Neutrophils Relative %: 85 %
Platelets: 97 10*3/uL — ABNORMAL LOW (ref 150–400)
RBC: 4.09 MIL/uL — ABNORMAL LOW (ref 4.22–5.81)
RDW: 12.9 % (ref 11.5–15.5)
WBC: 8.7 10*3/uL (ref 4.0–10.5)
nRBC: 0 % (ref 0.0–0.2)

## 2018-12-23 LAB — URINALYSIS, ROUTINE W REFLEX MICROSCOPIC
Bilirubin Urine: NEGATIVE
Glucose, UA: 50 mg/dL — AB
HGB URINE DIPSTICK: NEGATIVE
Ketones, ur: NEGATIVE mg/dL
Leukocytes,Ua: NEGATIVE
Nitrite: NEGATIVE
Protein, ur: NEGATIVE mg/dL
Specific Gravity, Urine: >1.046 — ABNORMAL HIGH (ref 1.005–1.030)
pH: 6 (ref 5.0–8.0)

## 2018-12-23 LAB — COMPREHENSIVE METABOLIC PANEL
ALT: 13 U/L (ref 0–44)
AST: 19 U/L (ref 15–41)
Albumin: 2.6 g/dL — ABNORMAL LOW (ref 3.5–5.0)
Alkaline Phosphatase: 92 U/L (ref 38–126)
Anion gap: 10 (ref 5–15)
BUN: 14 mg/dL (ref 6–20)
CO2: 24 mmol/L (ref 22–32)
Calcium: 8.6 mg/dL — ABNORMAL LOW (ref 8.9–10.3)
Chloride: 90 mmol/L — ABNORMAL LOW (ref 98–111)
Creatinine, Ser: 0.81 mg/dL (ref 0.61–1.24)
GFR calc Af Amer: >60 mL/min (ref >60)
Glucose, Bld: 223 mg/dL — ABNORMAL HIGH (ref 70–99)
POTASSIUM: 4.1 mmol/L (ref 3.5–5.1)
Sodium: 124 mmol/L — ABNORMAL LOW (ref 135–145)
Total Bilirubin: 1.3 mg/dL — ABNORMAL HIGH (ref 0.3–1.2)
Total Protein: 7.4 g/dL (ref 6.5–8.1)

## 2018-12-23 LAB — LIPASE, BLOOD: LIPASE: 28 U/L (ref 11–51)

## 2018-12-23 LAB — LACTIC ACID, PLASMA: Lactic Acid, Venous: 1.6 mmol/L (ref 0.5–1.9)

## 2018-12-23 MED ORDER — HYDROMORPHONE HCL 1 MG/ML IJ SOLN
1.0000 mg | Freq: Once | INTRAMUSCULAR | Status: AC
  Administered 2018-12-23: 1 mg via INTRAVENOUS
  Filled 2018-12-23: qty 1

## 2018-12-23 MED ORDER — SODIUM CHLORIDE 0.9 % IV SOLN
2.0000 g | INTRAVENOUS | Status: DC
  Administered 2018-12-23: 2 g via INTRAVENOUS
  Filled 2018-12-23: qty 20

## 2018-12-23 MED ORDER — ONDANSETRON HCL 4 MG/2ML IJ SOLN
4.0000 mg | INTRAMUSCULAR | Status: AC
  Administered 2018-12-23: 4 mg via INTRAVENOUS
  Filled 2018-12-23: qty 2

## 2018-12-23 MED ORDER — IOHEXOL 300 MG/ML  SOLN
100.0000 mL | Freq: Once | INTRAMUSCULAR | Status: AC | PRN
  Administered 2018-12-23: 100 mL via INTRAVENOUS

## 2018-12-23 MED ORDER — HYDROMORPHONE HCL 1 MG/ML IJ SOLN
0.5000 mg | Freq: Once | INTRAMUSCULAR | Status: AC
  Administered 2018-12-23: 0.5 mg via INTRAVENOUS
  Filled 2018-12-23: qty 1

## 2018-12-23 MED ORDER — VANCOMYCIN HCL IN DEXTROSE 1-5 GM/200ML-% IV SOLN
1000.0000 mg | Freq: Once | INTRAVENOUS | Status: AC
  Administered 2018-12-23: 1000 mg via INTRAVENOUS
  Filled 2018-12-23: qty 200
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
## 2018-12-24 ENCOUNTER — Inpatient Hospital Stay (HOSPITAL_COMMUNITY): Payer: Medicaid Other

## 2018-12-24 ENCOUNTER — Encounter (HOSPITAL_COMMUNITY): Payer: Self-pay | Admitting: Certified Registered Nurse Anesthetist

## 2018-12-24 ENCOUNTER — Encounter (HOSPITAL_COMMUNITY)
Admission: EM | Disposition: A | Discharge status: Home Health | Payer: Self-pay | Source: Home / Self Care | Attending: Surgery

## 2018-12-24 ENCOUNTER — Inpatient Hospital Stay (HOSPITAL_COMMUNITY): Payer: Medicaid Other | Admitting: Certified Registered"

## 2018-12-24 DIAGNOSIS — Z794 Long term (current) use of insulin: Secondary | ICD-10-CM | POA: Diagnosis not present

## 2018-12-24 DIAGNOSIS — K3189 Other diseases of stomach and duodenum: Secondary | ICD-10-CM | POA: Diagnosis present

## 2018-12-24 DIAGNOSIS — T361X5A Adverse effect of cephalosporins and other beta-lactam antibiotics, initial encounter: Secondary | ICD-10-CM | POA: Diagnosis not present

## 2018-12-24 DIAGNOSIS — Z87442 Personal history of urinary calculi: Secondary | ICD-10-CM | POA: Diagnosis not present

## 2018-12-24 DIAGNOSIS — I330 Acute and subacute infective endocarditis: Secondary | ICD-10-CM | POA: Diagnosis present

## 2018-12-24 DIAGNOSIS — F1910 Other psychoactive substance abuse, uncomplicated: Secondary | ICD-10-CM | POA: Diagnosis present

## 2018-12-24 DIAGNOSIS — F17210 Nicotine dependence, cigarettes, uncomplicated: Secondary | ICD-10-CM | POA: Diagnosis present

## 2018-12-24 DIAGNOSIS — R0602 Shortness of breath: Secondary | ICD-10-CM | POA: Diagnosis present

## 2018-12-24 DIAGNOSIS — D696 Thrombocytopenia, unspecified: Secondary | ICD-10-CM

## 2018-12-24 DIAGNOSIS — Z79899 Other long term (current) drug therapy: Secondary | ICD-10-CM | POA: Diagnosis not present

## 2018-12-24 DIAGNOSIS — D61818 Other pancytopenia: Secondary | ICD-10-CM | POA: Diagnosis not present

## 2018-12-24 DIAGNOSIS — K7460 Unspecified cirrhosis of liver: Secondary | ICD-10-CM

## 2018-12-24 DIAGNOSIS — F1990 Other psychoactive substance use, unspecified, uncomplicated: Secondary | ICD-10-CM

## 2018-12-24 DIAGNOSIS — E1169 Type 2 diabetes mellitus with other specified complication: Secondary | ICD-10-CM | POA: Diagnosis present

## 2018-12-24 DIAGNOSIS — B9561 Methicillin susceptible Staphylococcus aureus infection as the cause of diseases classified elsewhere: Secondary | ICD-10-CM | POA: Diagnosis present

## 2018-12-24 DIAGNOSIS — R7881 Bacteremia: Secondary | ICD-10-CM | POA: Diagnosis present

## 2018-12-24 DIAGNOSIS — Z8619 Personal history of other infectious and parasitic diseases: Secondary | ICD-10-CM

## 2018-12-24 DIAGNOSIS — E1140 Type 2 diabetes mellitus with diabetic neuropathy, unspecified: Secondary | ICD-10-CM | POA: Diagnosis present

## 2018-12-24 DIAGNOSIS — Z978 Presence of other specified devices: Secondary | ICD-10-CM

## 2018-12-24 DIAGNOSIS — K766 Portal hypertension: Secondary | ICD-10-CM | POA: Diagnosis present

## 2018-12-24 DIAGNOSIS — L02213 Cutaneous abscess of chest wall: Secondary | ICD-10-CM | POA: Diagnosis present

## 2018-12-24 DIAGNOSIS — B356 Tinea cruris: Secondary | ICD-10-CM | POA: Diagnosis present

## 2018-12-24 DIAGNOSIS — B182 Chronic viral hepatitis C: Secondary | ICD-10-CM | POA: Diagnosis present

## 2018-12-24 DIAGNOSIS — I10 Essential (primary) hypertension: Secondary | ICD-10-CM | POA: Diagnosis present

## 2018-12-24 DIAGNOSIS — Z23 Encounter for immunization: Secondary | ICD-10-CM | POA: Diagnosis not present

## 2018-12-24 DIAGNOSIS — E119 Type 2 diabetes mellitus without complications: Secondary | ICD-10-CM

## 2018-12-24 DIAGNOSIS — B1920 Unspecified viral hepatitis C without hepatic coma: Secondary | ICD-10-CM | POA: Diagnosis not present

## 2018-12-24 DIAGNOSIS — M868X8 Other osteomyelitis, other site: Secondary | ICD-10-CM | POA: Diagnosis present

## 2018-12-24 DIAGNOSIS — J853 Abscess of mediastinum: Secondary | ICD-10-CM | POA: Diagnosis present

## 2018-12-24 DIAGNOSIS — I313 Pericardial effusion (noninflammatory): Secondary | ICD-10-CM | POA: Diagnosis present

## 2018-12-24 HISTORY — PX: APPLICATION OF WOUND VAC: SHX5189

## 2018-12-24 HISTORY — PX: STERNAL WOUND DEBRIDEMENT: SHX1058

## 2018-12-24 LAB — BLOOD GAS, ARTERIAL
Acid-Base Excess: 2 mmol/L (ref 0.0–2.0)
Bicarbonate: 24.7 mmol/L (ref 20.0–28.0)
Drawn by: 31394
FIO2: 21
O2 Saturation: 94.5 %
Patient temperature: 100
pCO2 arterial: 31.6 mmHg — ABNORMAL LOW (ref 32.0–48.0)
pH, Arterial: 7.509 — ABNORMAL HIGH (ref 7.350–7.450)
pO2, Arterial: 70.8 mmHg — ABNORMAL LOW (ref 83.0–108.0)

## 2018-12-24 LAB — HIV ANTIBODY (ROUTINE TESTING W REFLEX): HIV Screen 4th Generation wRfx: NONREACTIVE

## 2018-12-24 LAB — BLOOD CULTURE ID PANEL (REFLEXED)
Acinetobacter baumannii: NOT DETECTED
CANDIDA PARAPSILOSIS: NOT DETECTED
CANDIDA TROPICALIS: NOT DETECTED
Candida albicans: NOT DETECTED
Candida glabrata: NOT DETECTED
Candida krusei: NOT DETECTED
Enterobacter cloacae complex: NOT DETECTED
Enterobacteriaceae species: NOT DETECTED
Enterococcus species: NOT DETECTED
Escherichia coli: NOT DETECTED
Haemophilus influenzae: NOT DETECTED
KLEBSIELLA OXYTOCA: NOT DETECTED
KLEBSIELLA PNEUMONIAE: NOT DETECTED
Listeria monocytogenes: NOT DETECTED
Methicillin resistance: NOT DETECTED
Neisseria meningitidis: NOT DETECTED
Proteus species: NOT DETECTED
Pseudomonas aeruginosa: NOT DETECTED
Serratia marcescens: NOT DETECTED
Staphylococcus aureus (BCID): DETECTED — AB
Staphylococcus species: DETECTED — AB
Streptococcus agalactiae: NOT DETECTED
Streptococcus pneumoniae: NOT DETECTED
Streptococcus pyogenes: NOT DETECTED
Streptococcus species: NOT DETECTED

## 2018-12-24 LAB — PROTIME-INR
INR: 1.24
Prothrombin Time: 15.4 seconds — ABNORMAL HIGH (ref 11.4–15.2)

## 2018-12-24 LAB — HEMOGLOBIN A1C
Hgb A1c MFr Bld: 7.5 % — ABNORMAL HIGH (ref 4.8–5.6)
Mean Plasma Glucose: 168.55 mg/dL

## 2018-12-24 LAB — GLUCOSE, CAPILLARY
Glucose-Capillary: 170 mg/dL — ABNORMAL HIGH (ref 70–99)
Glucose-Capillary: 222 mg/dL — ABNORMAL HIGH (ref 70–99)
Glucose-Capillary: 228 mg/dL — ABNORMAL HIGH (ref 70–99)
Glucose-Capillary: 230 mg/dL — ABNORMAL HIGH (ref 70–99)
Glucose-Capillary: 214 mg/dL — ABNORMAL HIGH (ref 70–99)
Glucose-Capillary: 219 mg/dL — ABNORMAL HIGH (ref 70–99)
Glucose-Capillary: 186 mg/dL — ABNORMAL HIGH (ref 70–99)

## 2018-12-24 LAB — ECHOCARDIOGRAM COMPLETE
Height: 72 in
Weight: 2881.85 oz

## 2018-12-24 LAB — SURGICAL PCR SCREEN
MRSA, PCR: POSITIVE — AB
Staphylococcus aureus: POSITIVE — AB

## 2018-12-24 LAB — ABO/RH: ABO/RH(D): O POS

## 2018-12-24 LAB — PREPARE RBC (CROSSMATCH)

## 2018-12-24 LAB — APTT: aPTT: 30 seconds (ref 24–36)

## 2018-12-24 SURGERY — DEBRIDEMENT, WOUND, STERNUM
Anesthesia: General

## 2018-12-24 MED ORDER — ASPIRIN EC 81 MG PO TBEC
81.0000 mg | DELAYED_RELEASE_TABLET | Freq: Every day | ORAL | Status: DC
  Administered 2018-12-24 – 2019-01-25 (×31): 81 mg via ORAL
  Filled 2018-12-24 (×31): qty 1

## 2018-12-24 MED ORDER — ONDANSETRON HCL 4 MG/2ML IJ SOLN: 4.0000 mg | Freq: Four times a day (QID) | INTRAMUSCULAR | Status: DC | PRN

## 2018-12-24 MED ORDER — POTASSIUM CHLORIDE 10 MEQ/50ML IV SOLN
10.0000 meq | Freq: Every day | INTRAVENOUS | Status: DC | PRN
  Filled 2018-12-24: qty 50

## 2018-12-24 MED ORDER — 0.9 % SODIUM CHLORIDE (POUR BTL) OPTIME
TOPICAL | Status: DC | PRN
  Administered 2018-12-24: 1000 mL

## 2018-12-24 MED ORDER — DEXAMETHASONE SODIUM PHOSPHATE 10 MG/ML IJ SOLN
INTRAMUSCULAR | Status: AC
  Filled 2018-12-24: qty 2

## 2018-12-24 MED ORDER — FENTANYL CITRATE (PF) 100 MCG/2ML IJ SOLN
INTRAMUSCULAR | Status: AC
  Filled 2018-12-24: qty 2

## 2018-12-24 MED ORDER — ONDANSETRON HCL 4 MG PO TABS: 4.0000 mg | ORAL_TABLET | Freq: Four times a day (QID) | ORAL | Status: DC | PRN

## 2018-12-24 MED ORDER — CEFAZOLIN SODIUM-DEXTROSE 1-4 GM/50ML-% IV SOLN
INTRAVENOUS | Status: AC
  Filled 2018-12-24: qty 50

## 2018-12-24 MED ORDER — OXYCODONE HCL 5 MG PO TABS
5.0000 mg | ORAL_TABLET | ORAL | Status: DC | PRN
  Administered 2018-12-24 – 2018-12-29 (×5): 5 mg via ORAL
  Filled 2018-12-24 (×5): qty 1

## 2018-12-24 MED ORDER — PROPOFOL 10 MG/ML IV BOLUS
INTRAVENOUS | Status: DC | PRN
  Administered 2018-12-24: 130 mg via INTRAVENOUS

## 2018-12-24 MED ORDER — SUCCINYLCHOLINE CHLORIDE 20 MG/ML IJ SOLN
INTRAMUSCULAR | Status: DC | PRN
  Administered 2018-12-24: 120 mg via INTRAVENOUS

## 2018-12-24 MED ORDER — INSULIN GLARGINE 100 UNIT/ML ~~LOC~~ SOLN
10.0000 [IU] | Freq: Two times a day (BID) | SUBCUTANEOUS | Status: DC
  Administered 2018-12-24: 10 [IU] via SUBCUTANEOUS
  Filled 2018-12-24: qty 0.1

## 2018-12-24 MED ORDER — LIDOCAINE 2% (20 MG/ML) 5 ML SYRINGE
INTRAMUSCULAR | Status: AC
  Filled 2018-12-24: qty 5

## 2018-12-24 MED ORDER — SENNOSIDES-DOCUSATE SODIUM 8.6-50 MG PO TABS
1.0000 | ORAL_TABLET | Freq: Every day | ORAL | Status: DC
  Administered 2018-12-24 – 2019-01-24 (×29): 1 via ORAL
  Filled 2018-12-24 (×30): qty 1

## 2018-12-24 MED ORDER — DIPHENHYDRAMINE HCL 50 MG/ML IJ SOLN: 12.5000 mg | Freq: Four times a day (QID) | INTRAMUSCULAR | Status: DC | PRN

## 2018-12-24 MED ORDER — BISACODYL 5 MG PO TBEC
10.0000 mg | DELAYED_RELEASE_TABLET | Freq: Every day | ORAL | Status: DC
  Administered 2018-12-25 – 2018-12-26 (×2): 10 mg via ORAL
  Filled 2018-12-24 (×2): qty 2

## 2018-12-24 MED ORDER — FENTANYL CITRATE (PF) 250 MCG/5ML IJ SOLN
INTRAMUSCULAR | Status: AC
  Filled 2018-12-24: qty 5

## 2018-12-24 MED ORDER — ONDANSETRON HCL 4 MG/2ML IJ SOLN
INTRAMUSCULAR | Status: DC | PRN
  Administered 2018-12-24: 4 mg via INTRAVENOUS

## 2018-12-24 MED ORDER — ONDANSETRON HCL 4 MG/2ML IJ SOLN
INTRAMUSCULAR | Status: AC
  Filled 2018-12-24: qty 2

## 2018-12-24 MED ORDER — CEFAZOLIN SODIUM-DEXTROSE 2-4 GM/100ML-% IV SOLN
2.0000 g | Freq: Three times a day (TID) | INTRAVENOUS | Status: DC
  Administered 2018-12-24 – 2019-01-14 (×64): 2 g via INTRAVENOUS
  Filled 2018-12-24 (×66): qty 100

## 2018-12-24 MED ORDER — CHLORHEXIDINE GLUCONATE CLOTH 2 % EX PADS
6.0000 | MEDICATED_PAD | Freq: Every day | CUTANEOUS | Status: DC
  Administered 2018-12-24 – 2018-12-27 (×3): 6 via TOPICAL

## 2018-12-24 MED ORDER — LIDOCAINE HCL (CARDIAC) PF 100 MG/5ML IV SOSY
PREFILLED_SYRINGE | INTRAVENOUS | Status: DC | PRN
  Administered 2018-12-24: 40 mg via INTRAVENOUS

## 2018-12-24 MED ORDER — TRAMADOL HCL 50 MG PO TABS
50.0000 mg | ORAL_TABLET | Freq: Four times a day (QID) | ORAL | Status: DC | PRN
  Administered 2018-12-25: 50 mg via ORAL
  Administered 2018-12-27: 100 mg via ORAL
  Filled 2018-12-24: qty 1
  Filled 2018-12-24: qty 2

## 2018-12-24 MED ORDER — ACETAMINOPHEN 500 MG PO TABS
1000.0000 mg | ORAL_TABLET | Freq: Four times a day (QID) | ORAL | Status: AC
  Filled 2018-12-24 (×2): qty 2

## 2018-12-24 MED ORDER — ACETAMINOPHEN 160 MG/5ML PO SOLN: 1000.0000 mg | Freq: Four times a day (QID) | ORAL | Status: AC

## 2018-12-24 MED ORDER — SODIUM CHLORIDE 0.45 % IV SOLN
INTRAVENOUS | Status: DC
  Administered 2018-12-24 – 2018-12-26 (×4): via INTRAVENOUS

## 2018-12-24 MED ORDER — HYDROMORPHONE HCL 1 MG/ML IJ SOLN: 0.2500 mg | INTRAMUSCULAR | Status: DC | PRN

## 2018-12-24 MED ORDER — LACTATED RINGERS IV SOLN
INTRAVENOUS | Status: DC | PRN
  Administered 2018-12-24: 16:00:00 via INTRAVENOUS

## 2018-12-24 MED ORDER — TRAZODONE HCL 100 MG PO TABS
100.0000 mg | ORAL_TABLET | Freq: Every evening | ORAL | Status: DC | PRN
  Administered 2019-01-01 – 2019-01-07 (×2): 100 mg via ORAL
  Filled 2018-12-24 (×4): qty 1

## 2018-12-24 MED ORDER — SUGAMMADEX SODIUM 200 MG/2ML IV SOLN
INTRAVENOUS | Status: DC | PRN
  Administered 2018-12-24: 150 mg via INTRAVENOUS

## 2018-12-24 MED ORDER — ALBUMIN HUMAN 5 % IV SOLN
INTRAVENOUS | Status: DC | PRN
  Administered 2018-12-24: 16:00:00 via INTRAVENOUS

## 2018-12-24 MED ORDER — LACTATED RINGERS IV SOLN
INTRAVENOUS | Status: DC
  Administered 2018-12-24: 15:00:00 via INTRAVENOUS

## 2018-12-24 MED ORDER — ROCURONIUM BROMIDE 100 MG/10ML IV SOLN
INTRAVENOUS | Status: DC | PRN
  Administered 2018-12-24: 50 mg via INTRAVENOUS

## 2018-12-24 MED ORDER — MIDAZOLAM HCL 5 MG/5ML IJ SOLN
INTRAMUSCULAR | Status: DC | PRN
  Administered 2018-12-24: 2 mg via INTRAVENOUS

## 2018-12-24 MED ORDER — PHENYLEPHRINE 40 MCG/ML (10ML) SYRINGE FOR IV PUSH (FOR BLOOD PRESSURE SUPPORT)
PREFILLED_SYRINGE | INTRAVENOUS | Status: AC
  Filled 2018-12-24: qty 10

## 2018-12-24 MED ORDER — PNEUMOCOCCAL VAC POLYVALENT 25 MCG/0.5ML IJ INJ: 0.5000 mL | INJECTION | INTRAMUSCULAR | Status: DC

## 2018-12-24 MED ORDER — PROMETHAZINE HCL 25 MG/ML IJ SOLN
6.2500 mg | INTRAMUSCULAR | Status: DC | PRN
  Administered 2018-12-24: 6.25 mg via INTRAVENOUS

## 2018-12-24 MED ORDER — MIDAZOLAM HCL 2 MG/2ML IJ SOLN
INTRAMUSCULAR | Status: AC
  Filled 2018-12-24: qty 2

## 2018-12-24 MED ORDER — MUPIROCIN 2 % EX OINT
1.0000 "application " | TOPICAL_OINTMENT | Freq: Two times a day (BID) | CUTANEOUS | Status: AC
  Administered 2018-12-24 – 2018-12-28 (×9): 1 via NASAL
  Filled 2018-12-24 (×2): qty 22

## 2018-12-24 MED ORDER — INFLUENZA VAC SPLIT QUAD 0.5 ML IM SUSY: 0.5000 mL | PREFILLED_SYRINGE | INTRAMUSCULAR | Status: DC

## 2018-12-24 MED ORDER — INSULIN ASPART 100 UNIT/ML ~~LOC~~ SOLN
0.0000 [IU] | SUBCUTANEOUS | Status: DC
  Administered 2018-12-24: 5 [IU] via SUBCUTANEOUS
  Administered 2018-12-24: 3 [IU] via SUBCUTANEOUS
  Administered 2018-12-24 (×2): 5 [IU] via SUBCUTANEOUS
  Administered 2018-12-24 – 2018-12-25 (×2): 3 [IU] via SUBCUTANEOUS
  Administered 2018-12-25: 5 [IU] via SUBCUTANEOUS
  Administered 2018-12-25: 2 [IU] via SUBCUTANEOUS
  Administered 2018-12-25: 3 [IU] via SUBCUTANEOUS
  Administered 2018-12-26 – 2018-12-27 (×3): 5 [IU] via SUBCUTANEOUS
  Administered 2018-12-27: 11 [IU] via SUBCUTANEOUS
  Administered 2018-12-27 – 2018-12-28 (×4): 3 [IU] via SUBCUTANEOUS
  Administered 2018-12-28: 2 [IU] via SUBCUTANEOUS
  Administered 2018-12-29 – 2018-12-31 (×4): 3 [IU] via SUBCUTANEOUS
  Administered 2019-01-02: 8 [IU] via SUBCUTANEOUS
  Administered 2019-01-03 – 2019-01-04 (×2): 5 [IU] via SUBCUTANEOUS
  Administered 2019-01-04 – 2019-01-05 (×4): 3 [IU] via SUBCUTANEOUS
  Administered 2019-01-05 – 2019-01-06 (×3): 2 [IU] via SUBCUTANEOUS

## 2018-12-24 MED ORDER — EPHEDRINE 5 MG/ML INJ
INTRAVENOUS | Status: AC
  Filled 2018-12-24: qty 10

## 2018-12-24 MED ORDER — HEMOSTATIC AGENTS (NO CHARGE) OPTIME
TOPICAL | Status: DC | PRN
  Administered 2018-12-24: 1 via TOPICAL

## 2018-12-24 MED ORDER — DIPHENHYDRAMINE HCL 12.5 MG/5ML PO ELIX
12.5000 mg | ORAL_SOLUTION | Freq: Four times a day (QID) | ORAL | Status: DC | PRN
  Filled 2018-12-24: qty 5

## 2018-12-24 MED ORDER — DULOXETINE HCL 60 MG PO CPEP
60.0000 mg | ORAL_CAPSULE | Freq: Every day | ORAL | Status: DC
  Administered 2018-12-24 – 2019-01-25 (×31): 60 mg via ORAL
  Filled 2018-12-24 (×31): qty 1

## 2018-12-24 MED ORDER — PROPOFOL 10 MG/ML IV BOLUS
INTRAVENOUS | Status: AC
  Filled 2018-12-24: qty 20

## 2018-12-24 MED ORDER — FENTANYL CITRATE (PF) 250 MCG/5ML IJ SOLN
INTRAMUSCULAR | Status: DC | PRN
  Administered 2018-12-24: 50 ug via INTRAVENOUS
  Administered 2018-12-24: 75 ug via INTRAVENOUS

## 2018-12-24 MED ORDER — SUCCINYLCHOLINE CHLORIDE 200 MG/10ML IV SOSY
PREFILLED_SYRINGE | INTRAVENOUS | Status: AC
  Filled 2018-12-24: qty 10

## 2018-12-24 MED ORDER — NALOXONE HCL 0.4 MG/ML IJ SOLN: 0.4000 mg | INTRAMUSCULAR | Status: DC | PRN

## 2018-12-24 MED ORDER — PROMETHAZINE HCL 25 MG/ML IJ SOLN
INTRAMUSCULAR | Status: AC
  Filled 2018-12-24: qty 1

## 2018-12-24 MED ORDER — MEPERIDINE HCL 50 MG/ML IJ SOLN: 6.2500 mg | INTRAMUSCULAR | Status: DC | PRN

## 2018-12-24 MED ORDER — LISINOPRIL 10 MG PO TABS
10.0000 mg | ORAL_TABLET | Freq: Every day | ORAL | Status: DC
  Administered 2018-12-24 – 2019-01-02 (×8): 10 mg via ORAL
  Filled 2018-12-24 (×9): qty 1

## 2018-12-24 MED ORDER — DEXTROSE-NACL 5-0.45 % IV SOLN
INTRAVENOUS | Status: DC
  Administered 2018-12-24 (×2): via INTRAVENOUS

## 2018-12-24 MED ORDER — PHENYLEPHRINE HCL 10 MG/ML IJ SOLN
INTRAMUSCULAR | Status: DC | PRN
  Administered 2018-12-24 (×5): 120 ug via INTRAVENOUS
  Administered 2018-12-24: 80 ug via INTRAVENOUS
  Administered 2018-12-24 (×3): 120 ug via INTRAVENOUS

## 2018-12-24 MED ORDER — HYDROMORPHONE HCL 1 MG/ML IJ SOLN
1.0000 mg | INTRAMUSCULAR | Status: DC | PRN
  Administered 2018-12-24: 1 mg via INTRAVENOUS
  Filled 2018-12-24: qty 1

## 2018-12-24 MED ORDER — ROCURONIUM BROMIDE 50 MG/5ML IV SOSY
PREFILLED_SYRINGE | INTRAVENOUS | Status: AC
  Filled 2018-12-24: qty 5

## 2018-12-24 MED ORDER — HYDROMORPHONE HCL 1 MG/ML IJ SOLN
1.0000 mg | INTRAMUSCULAR | Status: DC | PRN
  Administered 2018-12-24 (×3): 1 mg via INTRAVENOUS
  Filled 2018-12-24 (×3): qty 1

## 2018-12-24 MED ORDER — MORPHINE SULFATE 2 MG/ML IV SOLN
INTRAVENOUS | Status: DC
  Administered 2018-12-24 – 2018-12-25 (×2): via INTRAVENOUS
  Administered 2018-12-25: 3 mg via INTRAVENOUS
  Administered 2018-12-25: 3.4 mg via INTRAVENOUS
  Administered 2018-12-25: 3 mg via INTRAVENOUS
  Administered 2018-12-25: 5 mg via INTRAVENOUS
  Administered 2018-12-26: 18 mg via INTRAVENOUS
  Administered 2018-12-26: 9 mg via INTRAVENOUS
  Administered 2018-12-26: 6 mg via INTRAVENOUS
  Administered 2018-12-26: 13.5 mg via INTRAVENOUS
  Filled 2018-12-24 (×2): qty 60
  Filled 2018-12-24: qty 30

## 2018-12-24 MED ORDER — OXYCODONE HCL 5 MG PO TABS: 5.0000 mg | ORAL_TABLET | Freq: Once | ORAL | Status: DC | PRN

## 2018-12-24 MED ORDER — ATORVASTATIN CALCIUM 10 MG PO TABS
10.0000 mg | ORAL_TABLET | Freq: Every day | ORAL | Status: DC
  Administered 2019-01-01: 10 mg via ORAL
  Filled 2018-12-24 (×7): qty 1

## 2018-12-24 MED ORDER — INSULIN ASPART 100 UNIT/ML ~~LOC~~ SOLN
SUBCUTANEOUS | Status: AC
  Filled 2018-12-24: qty 1

## 2018-12-24 MED ORDER — OXYCODONE HCL 5 MG/5ML PO SOLN: 5.0000 mg | Freq: Once | ORAL | Status: DC | PRN

## 2018-12-24 MED ORDER — ENOXAPARIN SODIUM 40 MG/0.4ML ~~LOC~~ SOLN
40.0000 mg | Freq: Every day | SUBCUTANEOUS | Status: DC
  Administered 2018-12-25 – 2018-12-26 (×2): 40 mg via SUBCUTANEOUS
  Filled 2018-12-24 (×2): qty 0.4

## 2018-12-24 MED ORDER — SODIUM CHLORIDE 0.9% FLUSH: 9.0000 mL | INTRAVENOUS | Status: DC | PRN

## 2018-12-24 MED ORDER — CEFAZOLIN SODIUM-DEXTROSE 2-4 GM/100ML-% IV SOLN
2.0000 g | INTRAVENOUS | Status: DC
  Filled 2018-12-24: qty 100

## 2018-12-24 MED ORDER — INSULIN GLARGINE 100 UNIT/ML ~~LOC~~ SOLN
20.0000 [IU] | Freq: Two times a day (BID) | SUBCUTANEOUS | Status: DC
  Administered 2018-12-25 – 2018-12-28 (×7): 20 [IU] via SUBCUTANEOUS
  Filled 2018-12-24 (×9): qty 0.2

## 2018-12-24 MED ORDER — CEFAZOLIN SODIUM-DEXTROSE 1-4 GM/50ML-% IV SOLN
1.0000 g | INTRAVENOUS | Status: DC
  Filled 2018-12-24: qty 50

## 2018-12-24 SURGICAL SUPPLY — 52 items
ATTRACTOMAT 16X20 MAGNETIC DRP (DRAPES) ×3 IMPLANT
BAG DECANTER FOR FLEXI CONT (MISCELLANEOUS) ×3 IMPLANT
BLADE SURG 10 STRL SS (BLADE) ×6 IMPLANT
BNDG GAUZE ELAST 4 BULKY (GAUZE/BANDAGES/DRESSINGS) IMPLANT
CANISTER SUCT 3000ML PPV (MISCELLANEOUS) ×3 IMPLANT
CATH THORACIC 28FR RT ANG (CATHETERS) IMPLANT
CATH THORACIC 36FR (CATHETERS) IMPLANT
CATH THORACIC 36FR RT ANG (CATHETERS) IMPLANT
CLIP VESOCCLUDE SM WIDE 24/CT (CLIP) IMPLANT
CONT SPEC 4OZ CLIKSEAL STRL BL (MISCELLANEOUS) IMPLANT
COVER SURGICAL LIGHT HANDLE (MISCELLANEOUS) ×6 IMPLANT
COVER WAND RF STERILE (DRAPES) ×3 IMPLANT
DRAPE LAPAROSCOPIC ABDOMINAL (DRAPES) ×3 IMPLANT
DRSG PAD ABDOMINAL 8X10 ST (GAUZE/BANDAGES/DRESSINGS) ×9 IMPLANT
DRSG VAC ATS SM SENSATRAC (GAUZE/BANDAGES/DRESSINGS) ×3 IMPLANT
ELECT REM PT RETURN 9FT ADLT (ELECTROSURGICAL) ×3
ELECTRODE REM PT RTRN 9FT ADLT (ELECTROSURGICAL) ×1 IMPLANT
GAUZE SPONGE 4X4 12PLY STRL (GAUZE/BANDAGES/DRESSINGS) ×3 IMPLANT
GAUZE XEROFORM 5X9 LF (GAUZE/BANDAGES/DRESSINGS) IMPLANT
GLOVE EUDERMIC 7 POWDERFREE (GLOVE) ×9 IMPLANT
GOWN STRL REUS W/ TWL LRG LVL3 (GOWN DISPOSABLE) ×4 IMPLANT
GOWN STRL REUS W/ TWL XL LVL3 (GOWN DISPOSABLE) ×1 IMPLANT
GOWN STRL REUS W/TWL LRG LVL3 (GOWN DISPOSABLE) ×8
GOWN STRL REUS W/TWL XL LVL3 (GOWN DISPOSABLE) ×2
HANDPIECE INTERPULSE COAX TIP (DISPOSABLE)
HEMOSTAT SURGICEL 2X14 (HEMOSTASIS) IMPLANT
KIT BASIN OR (CUSTOM PROCEDURE TRAY) ×3 IMPLANT
KIT SUCTION CATH 14FR (SUCTIONS) IMPLANT
KIT TURNOVER KIT B (KITS) ×3 IMPLANT
MARKER SKIN DUAL TIP RULER LAB (MISCELLANEOUS) IMPLANT
NS IRRIG 1000ML POUR BTL (IV SOLUTION) ×3 IMPLANT
PACK CHEST (CUSTOM PROCEDURE TRAY) ×3 IMPLANT
PAD ARMBOARD 7.5X6 YLW CONV (MISCELLANEOUS) ×6 IMPLANT
PIN SAFETY STERILE (MISCELLANEOUS) IMPLANT
RUBBERBAND STERILE (MISCELLANEOUS) IMPLANT
SET HNDPC FAN SPRY TIP SCT (DISPOSABLE) IMPLANT
SPONGE LAP 18X18 RF (DISPOSABLE) ×3 IMPLANT
STAPLER VISISTAT 35W (STAPLE) IMPLANT
STRAP MONTGOMERY 1.25X11-1/8 (MISCELLANEOUS) IMPLANT
SUT ETHILON 3 0 FSL (SUTURE) IMPLANT
SUT STEEL 6MS V (SUTURE) IMPLANT
SUT STEEL STERNAL CCS#1 18IN (SUTURE) IMPLANT
SUT STEEL SZ 6 DBL 3X14 BALL (SUTURE) IMPLANT
SUT VIC AB 1 CTX 36 (SUTURE) ×4
SUT VIC AB 1 CTX36XBRD ANBCTR (SUTURE) ×2 IMPLANT
SWAB CULTURE ESWAB REG 1ML (MISCELLANEOUS) IMPLANT
SYR 5ML LL (SYRINGE) IMPLANT
TOWEL GREEN STERILE (TOWEL DISPOSABLE) ×3 IMPLANT
TOWEL GREEN STERILE FF (TOWEL DISPOSABLE) ×3 IMPLANT
TRAY FOLEY MTR SLVR 14FR STAT (SET/KITS/TRAYS/PACK) IMPLANT
WATER STERILE IRR 1000ML POUR (IV SOLUTION) ×3 IMPLANT
WND VAC CANISTER 500ML (MISCELLANEOUS) ×3 IMPLANT

## 2018-12-24 NOTE — Progress Notes (Signed)
   12/24/18 1100  Clinical Encounter Type  Visited With Patient and family together;Health care provider  Responded to [facility] consult for AD. Spoke with patient about ADand he indicated that he was interested but not sure at this time to make his agent. He said he like to have process done before his surgery at 2:00 today. Per nurse tech they will pit in another consult for AD if patient is ready to do so before his procedure. Pager for this chaplain to return is [phone] Chaplain Marilynn Latino

## 2018-12-24 NOTE — Anesthesia Procedure Notes (Signed)
Procedure Name: Intubation Date/Time: 12/24/2018 3:46 PM Performed by: Shirlyn Goltz, CRNA Pre-anesthesia Checklist: Patient identified, Emergency Drugs available, Suction available and Patient being monitored Patient Re-evaluated:Patient Re-evaluated prior to induction Oxygen Delivery Method: Circle system utilized Preoxygenation: Pre-oxygenation with 100% oxygen Induction Type: IV induction Ventilation: Mask ventilation without difficulty Laryngoscope Size: Mac and 4 Grade View: Grade I Tube type: Oral Tube size: 7.5 mm Number of attempts: 1 Airway Equipment and Method: Stylet Placement Confirmation: ETT inserted through vocal cords under direct vision,  positive ETCO2 and breath sounds checked- equal and bilateral Secured at: 21 cm Tube secured with: Tape Dental Injury: Teeth and Oropharynx as per pre-operative assessment

## 2018-12-24 NOTE — Brief Op Note (Signed)
12/24/2018  4:53 PM  PATIENT:  Chase Reed  53 y.o. male  PRE-OPERATIVE DIAGNOSIS:  CHEST WALL ABSCESS  POST-OPERATIVE DIAGNOSIS:  CHEST WALL ABSCESS  PROCEDURE:  Procedure(s):  I&D CHEST WALL ABSCESS (N/A) APPLICATION OF WOUND VAC (N/A)  SURGEON:  Surgeon(s) and Role:    * Bartle, Payton Doughty, MD - Primary  PHYSICIAN ASSISTANT: Izeyah Deike PA-C  ANESTHESIA:   general  EBL: Minimal  BLOOD ADMINISTERED:none  DRAINS: Wound VAc   LOCAL MEDICATIONS USED:  NONE  SPECIMEN:  Source of Specimen:  Purulent fluid from abscess  DISPOSITION OF SPECIMEN:  Microbiology  COUNTS:  YES  TOURNIQUET:  * No tourniquets in log *  DICTATION: .Dragon Dictation  PLAN OF CARE: Admit to inpatient   PATIENT DISPOSITION:  PACU - hemodynamically stable.   Delay start of Pharmacological VTE agent (>24hrs) due to surgical blood loss or risk of bleeding: no

## 2018-12-24 NOTE — Transfer of Care (Signed)
Immediate Anesthesia Transfer of Care Note  Patient: Chase Reed  Procedure(s) Performed: I&D CHEST WALL ABSCESS (N/A ) APPLICATION OF WOUND VAC (N/A )  Patient Location: PACU  Anesthesia Type:General  Level of Consciousness: awake, alert , oriented and patient cooperative  Airway & Oxygen Therapy: Patient Spontanous Breathing and Patient connected to nasal cannula oxygen  Post-op Assessment: Report given to RN and Post -op Vital signs reviewed and stable  Post vital signs: Reviewed and stable  Last Vitals:  Vitals Value Taken Time  BP    Temp    Pulse    Resp    SpO2      Last Pain:  Vitals:   12/24/18 1701  TempSrc:   PainSc: Asleep      Patients Stated Pain Goal: 4 (12/24/18 0202)  Complications: No apparent anesthesia complications

## 2018-12-24 NOTE — Anesthesia Preprocedure Evaluation (Addendum)
Anesthesia Evaluation  Patient identified by MRN, date of birth, ID band Patient awake    Reviewed: Allergy & Precautions, NPO status , Patient's Chart, lab work & pertinent test results  Airway Mallampati: II   Neck ROM: full    Dental no notable dental hx.    Pulmonary Current Smoker,    Pulmonary exam normal breath sounds clear to auscultation       Cardiovascular hypertension, Normal cardiovascular exam Rhythm:regular Rate:Normal     Neuro/Psych PSYCHIATRIC DISORDERS Schizophrenia negative neurological ROS     GI/Hepatic negative GI ROS, (+)     substance abuse  IV drug use, Hepatitis -  Endo/Other  diabetes  Renal/GU negative Renal ROS     Musculoskeletal negative musculoskeletal ROS (+)   Abdominal   Peds  Hematology negative hematology ROS (+)   Anesthesia Other Findings   Reproductive/Obstetrics                            Anesthesia Physical Anesthesia Plan  ASA: III  Anesthesia Plan: General   Post-op Pain Management:    Induction: Intravenous  PONV Risk Score and Plan: 2 and Ondansetron, Dexamethasone, Midazolam and Treatment may vary due to age or medical condition  Airway Management Planned: Oral ETT  Additional Equipment: Arterial line  Intra-op Plan:   Post-operative Plan: Extubation in OR  Informed Consent: I have reviewed the patients History and Physical, chart, labs and discussed the procedure including the risks, benefits and alternatives for the proposed anesthesia with the patient or authorized representative who has indicated his/her understanding and acceptance.     Dental advisory given  Plan Discussed with: CRNA, Anesthesiologist and Surgeon  Anesthesia Plan Comments:       Anesthesia Quick Evaluation

## 2018-12-24 NOTE — Anesthesia Procedure Notes (Signed)
Arterial Line Insertion Start/End2/21/2020 3:00 AM, 12/24/2018 3:15 AM Performed by: Zollie Scale, CRNA, CRNA  Patient location: Pre-op. Preanesthetic checklist: patient identified, IV checked, site marked, risks and benefits discussed, surgical consent, monitors and equipment checked, pre-op evaluation, timeout performed and anesthesia consent Lidocaine 1% used for infiltration Right, radial was placed Catheter size: 20 G Hand hygiene performed , maximum sterile barriers used  and Seldinger technique used Allen's test indicative of satisfactory collateral circulation Attempts: 2 Procedure performed without using ultrasound guided technique. Following insertion, dressing applied and Biopatch. Post procedure assessment: normal  Patient tolerated the procedure well with no immediate complications.
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
## 2018-12-24 NOTE — Progress Notes (Signed)
PHARMACY - PHYSICIAN COMMUNICATION CRITICAL VALUE ALERT - BLOOD CULTURE IDENTIFICATION (BCID)  Chase Reed is an 53 y.o. male who presented to [facility] on 12/23/2018 with a chief complaint of SOB  Assessment:  53 yo M presents with SOB and abscess on sternum. Given one time doses of vancomycin and ceftriaxone last night. CVTS consulted and plans to take to OR today for debridement of abscess and obtain cx's. Would expect the abscess is the source of MSSA bacteremia.  Name of physician (or Provider) Contacted: [name] PA-C  Current antibiotics: Ceftriaxone 2g IV Q24h  Changes to prescribed antibiotics recommended:  Start cefazolin 2g IV Q8h Monitor clinical picture, renal function Will get automatic ID consult F/U ID workup, LOT   Results for orders placed or performed during the hospital encounter of 12/23/18  Blood Culture ID Panel (Reflexed) (Collected: 12/23/2018  8:30 PM)  Result Value Ref Range   Enterococcus species NOT DETECTED NOT DETECTED   Listeria monocytogenes NOT DETECTED NOT DETECTED   Staphylococcus species DETECTED (A) NOT DETECTED   Staphylococcus aureus (BCID) DETECTED (A) NOT DETECTED   Methicillin resistance NOT DETECTED NOT DETECTED   Streptococcus species NOT DETECTED NOT DETECTED   Streptococcus agalactiae NOT DETECTED NOT DETECTED   Streptococcus pneumoniae NOT DETECTED NOT DETECTED   Streptococcus pyogenes NOT DETECTED NOT DETECTED   Acinetobacter baumannii NOT DETECTED NOT DETECTED   Enterobacteriaceae species NOT DETECTED NOT DETECTED   Enterobacter cloacae complex NOT DETECTED NOT DETECTED   Escherichia coli NOT DETECTED NOT DETECTED   Klebsiella oxytoca NOT DETECTED NOT DETECTED   Klebsiella pneumoniae NOT DETECTED NOT DETECTED   Proteus species NOT DETECTED NOT DETECTED   Serratia marcescens NOT DETECTED NOT DETECTED   Haemophilus influenzae NOT DETECTED NOT DETECTED   Neisseria meningitidis NOT DETECTED NOT DETECTED   Pseudomonas  aeruginosa NOT DETECTED NOT DETECTED   Candida albicans NOT DETECTED NOT DETECTED   Candida glabrata NOT DETECTED NOT DETECTED   Candida krusei NOT DETECTED NOT DETECTED   Candida parapsilosis NOT DETECTED NOT DETECTED   Candida tropicalis NOT DETECTED NOT DETECTED   Enzo Bi, PharmD, BCPS, BCIDP Clinical Pharmacist Phone number [phone] 12/24/2018 12:32 PM

## 2018-12-24 NOTE — Op Note (Signed)
CARDIOTHORACIC SURGERY OPERATIVE NOTE:  Chase Reed [id number] 12/24/2018   Preoperative Dx:  Chest wall and anterior mediastinal abscess  Postoperative Dx: same   Procedure: Incision and drainage of chest wall and mediastinal abscess with placement of wound VAC  Surgeon: Dr. Alleen Borne   Assistant: [name], PA-C  Anesthesia: GET   Clinical History:   The patient is a 53 year old gentleman with history of diabetes, cirrhosis, thrombocytopenia, hepatitis C, and intravenous drug abuse who presented to the emergency room with a large erythematous fluctuant and tender mass in the middle of his right anterior chest with some associated ecchymosis under the left breast as well as in the umbilical region.  He was afebrile and his white blood cell count was normal.  A CT scan of the chest showed a large soft tissue abscess of the anterior chest wall measuring 14.2 x 5.2 x 10.8 cm.  There is no evidence of bony destruction in the ribs or sternum.  There is also a retrosternal abscess measuring 5 x 3.4 x 7.2 cm anterior to the great vessels.  He was admitted for intravenous antibiotics.  His blood cultures grew methicillin sensitive staph aureus.  It was felt that most likely this was due to seeding from the bacteremia related to intravenous drug abuse.  It was felt this would require drainage in the operating room and placement of a wound VAC.  The operative procedure was discussed with the patient and his family including alternatives, benefits, and risks including but not limited to bleeding, blood transfusion, need for further debridement, injury to mediastinal structures possibly requiring median sternotomy, possibility of developing osteomyelitis of the ribs or sternum, and the possibility that there were other sites of infection.  He understood and agreed to proceed.  Operative procedure:   The patient was seen in the preoperative holding area. The proper patient, proper operative  site, proper operation were confirmed after reviewing his history and examining patient.  He had already received antibiotics that were scheduled during his admission and no further antibiotics were given in the operating room.  He was taken back to the operating room and placed on table in the supine position. After induction of general endotracheal anesthesia a Foley catheter was placed in the bladder using sterile technique. Lower extremity sequential compression devices were used.  The neck, chest and abdomen was prepped with Betadine soap and solution and draped in the usual sterile manner. A timeout was taken and the proper patient, proper operation, and proper operative site were confirmed with nursing and anesthesia staff.  Then a 5 cm incision was made transversely over the area of maximum fluctuance.  This was carried down through the subcutaneous tissue using electrocautery and immediately encountered a large amount of creamy white pus.  This was cultured.  There was a large subcutaneous abscess cavity which extended downward through the pectoralis muscle and through the intercostal muscle into the retrosternal space.  The pus had followed the natural tissue planes out into the muscle and when the chest wall was massaged it appeared that purulent fluid was exuding from all of the soft tissues of the chest wall.  This massage was continued until no further purulent fluid could be expressed.  There was some necrotic muscle and subcutaneous tissue which was excised using electrocautery.  The wound was irrigated with saline.  There is complete hemostasis.  Then a medium black VAC sponge was cut to the appropriate size and shape and positioned in the wound  with part of the sponge down into the retrosternal space.  The plastic dressing was then applied over the VAC sponge to provide an airtight seal and the suction was applied to this.  The sponge needle and instrument counts were correct according scrub nurse.   Patient was then awakened, extubated, and transported to the post anesthesia care unit in satisfactory condition.
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
## 2018-12-25 DIAGNOSIS — F1910 Other psychoactive substance abuse, uncomplicated: Secondary | ICD-10-CM

## 2018-12-25 LAB — GLUCOSE, CAPILLARY
Glucose-Capillary: 113 mg/dL — ABNORMAL HIGH (ref 70–99)
Glucose-Capillary: 137 mg/dL — ABNORMAL HIGH (ref 70–99)
Glucose-Capillary: 148 mg/dL — ABNORMAL HIGH (ref 70–99)
Glucose-Capillary: 161 mg/dL — ABNORMAL HIGH (ref 70–99)
Glucose-Capillary: 195 mg/dL — ABNORMAL HIGH (ref 70–99)
Glucose-Capillary: 222 mg/dL — ABNORMAL HIGH (ref 70–99)

## 2018-12-25 LAB — CBC
HCT: 28.6 % — ABNORMAL LOW (ref 39.0–52.0)
Hemoglobin: 9.5 g/dL — ABNORMAL LOW (ref 13.0–17.0)
MCH: 29.1 pg (ref 26.0–34.0)
MCHC: 33.2 g/dL (ref 30.0–36.0)
MCV: 87.7 fL (ref 80.0–100.0)
Platelets: 75 10*3/uL — ABNORMAL LOW (ref 150–400)
RBC: 3.26 MIL/uL — ABNORMAL LOW (ref 4.22–5.81)
RDW: 13.1 % (ref 11.5–15.5)
WBC: 3.9 10*3/uL — ABNORMAL LOW (ref 4.0–10.5)
nRBC: 0 % (ref 0.0–0.2)

## 2018-12-25 LAB — BASIC METABOLIC PANEL
Anion gap: 9 (ref 5–15)
BUN: 10 mg/dL (ref 6–20)
CO2: 24 mmol/L (ref 22–32)
Calcium: 7.8 mg/dL — ABNORMAL LOW (ref 8.9–10.3)
Chloride: 96 mmol/L — ABNORMAL LOW (ref 98–111)
Creatinine, Ser: 0.6 mg/dL — ABNORMAL LOW (ref 0.61–1.24)
GFR calc Af Amer: >60 mL/min (ref >60)
GFR calc non Af Amer: >60 mL/min (ref >60)
Glucose, Bld: 119 mg/dL — ABNORMAL HIGH (ref 70–99)
Potassium: 3.5 mmol/L (ref 3.5–5.1)
Sodium: 129 mmol/L — ABNORMAL LOW (ref 135–145)

## 2018-12-25 NOTE — Clinical Social Work Note (Signed)
CSW received consult that patient wants assistance with advanced directives.  Chaplain can assist with this, please consult chaplain.  CSW signing off, please reconsult if social work needs arise.  Ervin Knack. Hartman Minahan, MSW, LCSW [phone]  12/25/2018 10:05 AM

## 2018-12-25 NOTE — Anesthesia Postprocedure Evaluation (Signed)
Anesthesia Post Note  Patient: Chase Reed  Procedure(s) Performed: I&D CHEST WALL ABSCESS (N/A ) APPLICATION OF WOUND VAC (N/A )     Patient location during evaluation: PACU Anesthesia Type: General Level of consciousness: awake and alert Pain management: pain level controlled Vital Signs Assessment: post-procedure vital signs reviewed and stable Respiratory status: spontaneous breathing, nonlabored ventilation, respiratory function stable and patient connected to nasal cannula oxygen Cardiovascular status: blood pressure returned to baseline and stable Postop Assessment: no apparent nausea or vomiting Anesthetic complications: no    Last Vitals:  Vitals:   12/25/18 0422 12/25/18 0423  BP: 97/68   Pulse: 66   Resp: 15 15  Temp: 36.5 C   SpO2: 97% 98%    Last Pain:  Vitals:   12/25/18 0423  TempSrc:   PainSc: 5                  Shelton Silvas
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
## 2018-12-25 NOTE — Progress Notes (Addendum)
      [address]       [place] [address]             [phone]      1 Day Post-Op Procedure(s) (LRB): I&D CHEST WALL ABSCESS (N/A) APPLICATION OF WOUND VAC (N/A) Subjective: Feels good today. His pain is well controlled with the PCA. Appetite still isn't there.   Objective: Vital signs in last 24 hours: Temp:  [97.5 F (36.4 C)-98.2 F (36.8 C)] 97.5 F (36.4 C) (02/22 1629) Pulse Rate:  [62-74] 63 (02/22 1629) Cardiac Rhythm: Normal sinus rhythm (02/21 1900) Resp:  [10-18] 13 (02/22 1629) BP: (96-112)/(65-74) 106/72 (02/22 1629) SpO2:  [96 %-100 %] 99 % (02/22 1629) Arterial Line BP: (109-116)/(52-57) 112/55 (02/22 0422) Weight:  [84.5 kg] 84.5 kg (02/22 0500)     Intake/Output from previous day: 02/21 0701 - 02/22 0700 In: 2705.9 [P.O.:300; I.V.:1855.1; IV Piggyback:550.8] Out: 1450 [Urine:1300; Drains:100; Blood:50] Intake/Output this shift: Total I/O In: 1420 [P.O.:320; I.V.:1000; IV Piggyback:100] Out: 870 [Urine:870]  General appearance: alert, cooperative and no distress Heart: regular rate and rhythm, S1, S2 normal, no murmur, click, rub or gallop Lungs: clear to auscultation bilaterally Abdomen: soft, non-tender; bowel sounds normal; no masses,  no organomegaly Extremities: extremities normal, atraumatic, no cyanosis or edema Wound: good suction on the wound vac  Lab Results: Recent Labs    12/23/18 2056 12/25/18 0415  WBC 8.7 3.9*  HGB 11.8* 9.5*  HCT 35.8* 28.6*  PLT 97* 75*   BMET:  Recent Labs    12/23/18 2056 12/25/18 0415  NA 124* 129*  K 4.1 3.5  CL 90* 96*  CO2 24 24  GLUCOSE 223* 119*  BUN 14 10  CREATININE 0.81 0.60*  CALCIUM 8.6* 7.8*    PT/INR:  Recent Labs    12/24/18 0127  LABPROT 15.4*  INR 1.24   ABG    Component Value Date/Time   PHART 7.509 (H) 12/24/2018 0311   HCO3 24.7 12/24/2018 0311   O2SAT 94.5 12/24/2018 0311   CBG (last 3)  Recent Labs    12/25/18 0730 12/25/18 1147  12/25/18 1627  GLUCAP 137* 161* 195*    Assessment/Plan: S/P Procedure(s) (LRB): I&D CHEST WALL ABSCESS (N/A) APPLICATION OF WOUND VAC (N/A)  1. CV-NSR in the 60s, BP well controlled 2. Tolerating room air with good oxygen saturation. CXR yesterday showed hazy opacity over the upper mediastinum 3. ID- blood cultures pending, no recent fevers. WBC 3.9 4. Renal-creatinine 0.60, electrolytes okay 5. Endo-moderately controlled.   Plan: Discontinue a-line after morning labs. Keep fluids for now since appetite is slow to pick back up. Watch blood cultures. Ambulate as able. OOB to chair. Wound vac with good suction.    LOS: 1 day    Sharlene Dory 12/25/2018   Chart reviewed, patient examined, agree with above. Plan wound vac change in the OR Monday under anesthesia with wound debridement if necessary.

## 2018-12-26 ENCOUNTER — Encounter (HOSPITAL_COMMUNITY): Payer: Self-pay | Admitting: Surgery

## 2018-12-26 LAB — CULTURE, BLOOD (ROUTINE X 2)

## 2018-12-26 LAB — COMPREHENSIVE METABOLIC PANEL
ALT: 9 U/L (ref 0–44)
AST: 15 U/L (ref 15–41)
Albumin: 2 g/dL — ABNORMAL LOW (ref 3.5–5.0)
Alkaline Phosphatase: 63 U/L (ref 38–126)
Anion gap: 10 (ref 5–15)
BUN: 8 mg/dL (ref 6–20)
CO2: 24 mmol/L (ref 22–32)
CREATININE: 0.62 mg/dL (ref 0.61–1.24)
Calcium: 7.9 mg/dL — ABNORMAL LOW (ref 8.9–10.3)
Chloride: 98 mmol/L (ref 98–111)
GFR calc Af Amer: >60 mL/min (ref >60)
GFR calc non Af Amer: >60 mL/min (ref >60)
Glucose, Bld: 165 mg/dL — ABNORMAL HIGH (ref 70–99)
Potassium: 3.7 mmol/L (ref 3.5–5.1)
Sodium: 132 mmol/L — ABNORMAL LOW (ref 135–145)
Total Bilirubin: 0.5 mg/dL (ref 0.3–1.2)
Total Protein: 6.4 g/dL — ABNORMAL LOW (ref 6.5–8.1)

## 2018-12-26 LAB — CBC
HCT: 29.2 % — ABNORMAL LOW (ref 39.0–52.0)
Hemoglobin: 9.4 g/dL — ABNORMAL LOW (ref 13.0–17.0)
MCH: 28.4 pg (ref 26.0–34.0)
MCHC: 32.2 g/dL (ref 30.0–36.0)
MCV: 88.2 fL (ref 80.0–100.0)
NRBC: 0 % (ref 0.0–0.2)
Platelets: 75 10*3/uL — ABNORMAL LOW (ref 150–400)
RBC: 3.31 MIL/uL — ABNORMAL LOW (ref 4.22–5.81)
RDW: 13 % (ref 11.5–15.5)
WBC: 2.3 10*3/uL — ABNORMAL LOW (ref 4.0–10.5)

## 2018-12-26 LAB — GLUCOSE, CAPILLARY
Glucose-Capillary: 116 mg/dL — ABNORMAL HIGH (ref 70–99)
Glucose-Capillary: 147 mg/dL — ABNORMAL HIGH (ref 70–99)
Glucose-Capillary: 174 mg/dL — ABNORMAL HIGH (ref 70–99)
Glucose-Capillary: 179 mg/dL — ABNORMAL HIGH (ref 70–99)
Glucose-Capillary: 190 mg/dL — ABNORMAL HIGH (ref 70–99)
Glucose-Capillary: 210 mg/dL — ABNORMAL HIGH (ref 70–99)

## 2018-12-26 LAB — HCV RNA QUANT
HCV Quantitative Log: 1.477 log10 IU/mL — ABNORMAL LOW (ref >1.70)
HCV Quantitative: 30 IU/mL — ABNORMAL LOW (ref >50)

## 2018-12-26 MED ORDER — SODIUM CHLORIDE 0.9% FLUSH
9.0000 mL | INTRAVENOUS | Status: DC | PRN
  Administered 2018-12-26: 9 mL via INTRAVENOUS
  Filled 2018-12-26: qty 9

## 2018-12-26 MED ORDER — ONDANSETRON HCL 4 MG/2ML IJ SOLN: 4.0000 mg | Freq: Four times a day (QID) | INTRAMUSCULAR | Status: DC | PRN

## 2018-12-26 MED ORDER — NALOXONE HCL 0.4 MG/ML IJ SOLN: 0.4000 mg | INTRAMUSCULAR | Status: DC | PRN

## 2018-12-26 MED ORDER — DIPHENHYDRAMINE HCL 12.5 MG/5ML PO ELIX
12.5000 mg | ORAL_SOLUTION | Freq: Four times a day (QID) | ORAL | Status: DC | PRN
  Filled 2018-12-26: qty 5

## 2018-12-26 MED ORDER — DIPHENHYDRAMINE HCL 50 MG/ML IJ SOLN: 12.5000 mg | Freq: Four times a day (QID) | INTRAMUSCULAR | Status: DC | PRN

## 2018-12-26 MED ORDER — FENTANYL 40 MCG/ML IV SOLN
INTRAVENOUS | Status: DC
  Administered 2018-12-26: 11:00:00 via INTRAVENOUS
  Administered 2018-12-26 (×2): 105 ug via INTRAVENOUS
  Administered 2018-12-27: 45 ug via INTRAVENOUS
  Administered 2018-12-27: 150 ug via INTRAVENOUS
  Administered 2018-12-27: 135 ug via INTRAVENOUS
  Administered 2018-12-27: 1000 ug via INTRAVENOUS
  Administered 2018-12-27: 165 ug via INTRAVENOUS
  Administered 2018-12-28: 1000 ug via INTRAVENOUS
  Administered 2018-12-28: 135 ug via INTRAVENOUS
  Administered 2018-12-28: 165 ug via INTRAVENOUS
  Administered 2018-12-28 (×2): 180 ug via INTRAVENOUS
  Administered 2018-12-28: 30 ug via INTRAVENOUS
  Administered 2018-12-29: 120 ug via INTRAVENOUS
  Administered 2018-12-29: 0 ug via INTRAVENOUS
  Filled 2018-12-26 (×2): qty 25
  Filled 2018-12-26 (×2): qty 1000

## 2018-12-26 NOTE — Progress Notes (Signed)
A line removed as ordered and per protocol. Tip intact. Pressure held. And dressing applied. Will monitor patient. Payslee Bateson, Randall An rN

## 2018-12-26 NOTE — Progress Notes (Addendum)
2 RN verified waste of 1Mg  of Morphine from PCA [employer] , PCA morphine syringe changed . Aizley Stenseth, Randall An RN

## 2018-12-26 NOTE — Progress Notes (Signed)
2 RN verified, Comptroller and KristinRN 22mg  Morphine PCA wasted. New syringe of [employer] PCA initiated as ordered.  Medication wasted in medicine room. Fabyan Loughmiller, Randall An RN

## 2018-12-26 NOTE — Anesthesia Preprocedure Evaluation (Addendum)
Anesthesia Evaluation  Patient identified by MRN, date of birth, ID band Patient awake    Reviewed: Allergy & Precautions, NPO status , Patient's Chart, lab work & pertinent test results  History of Anesthesia Complications Negative for: history of anesthetic complications  Airway Mallampati: II  TM Distance: >3 FB Neck ROM: Full    Dental  (+) Dental Advisory Given, Teeth Intact   Pulmonary Current Smoker,    breath sounds clear to auscultation       Cardiovascular hypertension, Pt. on medications (-) angina Rhythm:Regular Rate:Normal   '20 TTE - left ventricle has hyperdynamic systolic function, with an ejection fraction of >65%. Trivial pericardial effusion is present. The pericardial effusion is circumferential. No definite valvular vegetations. Image quality is not sufficient for very small valvular lesions.   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 neurological ROS     GI/Hepatic negative GI ROS, (+)     substance abuse  IV drug use, Hepatitis -  Endo/Other  diabetes, Type 2, Insulin Dependent, Oral Hypoglycemic Agents Hyponatremia Hypocalcemia  Renal/GU negative Renal ROS     Musculoskeletal negative musculoskeletal ROS (+) narcotic dependent  Abdominal   Peds  Hematology  (+) anemia ,  Thrombocytopenia    Anesthesia Other Findings   Reproductive/Obstetrics                           Anesthesia Physical Anesthesia Plan  ASA: III  Anesthesia Plan: General   Post-op Pain Management:    Induction: Intravenous  PONV Risk Score and Plan: 3 and Treatment may vary due to age or medical condition, Ondansetron, Dexamethasone and Midazolam  Airway Management Planned: LMA  Additional Equipment: None  Intra-op Plan:   Post-operative Plan: Extubation in OR  Informed  Consent: I have reviewed the patients History and Physical, chart, labs and discussed the procedure including the risks, benefits and alternatives for the proposed anesthesia with the patient or authorized representative who has indicated his/her understanding and acceptance.     Dental advisory given  Plan Discussed with: CRNA and Anesthesiologist  Anesthesia Plan Comments:        Anesthesia Quick Evaluation

## 2018-12-26 NOTE — Progress Notes (Addendum)
      [address]       [place], [address]             [phone]      2 Days Post-Op Procedure(s) (LRB): I&D CHEST WALL ABSCESS (N/A) APPLICATION OF WOUND VAC (N/A) Subjective: In more pain today. He states that the PCA isn't helping.   Objective: Vital signs in last 24 hours: Temp:  [97.5 F (36.4 C)-97.6 F (36.4 C)] 97.6 F (36.4 C) (02/23 0409) Pulse Rate:  [59-65] 65 (02/23 0758) Cardiac Rhythm: Normal sinus rhythm (02/23 0700) Resp:  [11-96] 13 (02/23 0913) BP: (101-106)/(69-82) 105/69 (02/23 0409) SpO2:  [96 %-99 %] 98 % (02/23 0913) Arterial Line BP: (109-123)/(54-64) 109/54 (02/23 0758) Weight:  [84.5 kg] 84.5 kg (02/23 0409)     Intake/Output from previous day: 02/22 0701 - 02/23 0700 In: 2930 [P.O.:930; I.V.:1800; IV Piggyback:200] Out: 2870 [Urine:2870] Intake/Output this shift: No intake/output data recorded.  General appearance: alert, cooperative and mild distress Heart: regular rate and rhythm, S1, S2 normal, no murmur, click, rub or gallop Lungs: clear to auscultation bilaterally Abdomen: soft, non-tender; bowel sounds normal; no masses,  no organomegaly Extremities: tender but no pedal edema Wound: good suction on wound vac  Lab Results: Recent Labs    12/25/18 0415 12/26/18 0241  WBC 3.9* 2.3*  HGB 9.5* 9.4*  HCT 28.6* 29.2*  PLT 75* 75*   BMET:  Recent Labs    12/25/18 0415 12/26/18 0241  NA 129* 132*  K 3.5 3.7  CL 96* 98  CO2 24 24  GLUCOSE 119* 165*  BUN 10 8  CREATININE 0.60* 0.62  CALCIUM 7.8* 7.9*    PT/INR:  Recent Labs    12/24/18 0127  LABPROT 15.4*  INR 1.24   ABG    Component Value Date/Time   PHART 7.509 (H) 12/24/2018 0311   HCO3 24.7 12/24/2018 0311   O2SAT 94.5 12/24/2018 0311   CBG (last 3)  Recent Labs    12/26/18 0008 12/26/18 0407 12/26/18 0734  GLUCAP 116* 147* 174*    Assessment/Plan: S/P Procedure(s) (LRB): I&D CHEST WALL ABSCESS (N/A) APPLICATION OF WOUND VAC  (N/A)  1. CV-NSR in the 60s, BP well controlled 2. Tolerating room air with good oxygen saturation. CXR yesterday showed hazy opacity over the upper mediastinum 3. ID- blood cultures pending, no recent fevers. WBC 2.3. Abundant staph aureus from OR cultures. Continue Ancef. 4. Renal-creatinine 0.62, electrolytes okay 5. Endo-DM, blood glucose controlled 6. H and H stable 9.4/29.2  Plan: Pain not controlled this morning and patient has not moved much since surgery. Will change Morphine PCA to Fentanyl. Encouraged use of oral pain medication. To the OR tomorrow for wound vac change.    LOS: 2 days    Sharlene Dory 12/26/2018   Chart reviewed, patient examined, agree with above. I will plan first VAC change in the OR tomorrow under anesthesia in case further debridement is needed. I discussed the procedure with the patient including alternatives, benefits and risks including but not limited to bleeding, infection, pneumothorax. He understands and agrees to proceed.

## 2018-12-27 ENCOUNTER — Encounter (HOSPITAL_COMMUNITY)
Admission: EM | Disposition: A | Discharge status: Home Health | Payer: Self-pay | Source: Home / Self Care | Attending: Surgery

## 2018-12-27 ENCOUNTER — Inpatient Hospital Stay (HOSPITAL_COMMUNITY): Payer: Medicaid Other | Admitting: Anesthesiology

## 2018-12-27 DIAGNOSIS — J853 Abscess of mediastinum: Principal | ICD-10-CM

## 2018-12-27 DIAGNOSIS — Z9889 Other specified postprocedural states: Secondary | ICD-10-CM

## 2018-12-27 HISTORY — PX: STERNAL WOUND DEBRIDEMENT: SHX1058

## 2018-12-27 HISTORY — PX: APPLICATION OF WOUND VAC: SHX5189

## 2018-12-27 LAB — GLUCOSE, CAPILLARY
Glucose-Capillary: 114 mg/dL — ABNORMAL HIGH (ref 70–99)
Glucose-Capillary: 122 mg/dL — ABNORMAL HIGH (ref 70–99)
Glucose-Capillary: 180 mg/dL — ABNORMAL HIGH (ref 70–99)
Glucose-Capillary: 222 mg/dL — ABNORMAL HIGH (ref 70–99)
Glucose-Capillary: 249 mg/dL — ABNORMAL HIGH (ref 70–99)
Glucose-Capillary: 321 mg/dL — ABNORMAL HIGH (ref 70–99)
Glucose-Capillary: 93 mg/dL (ref 70–99)

## 2018-12-27 SURGERY — DEBRIDEMENT, WOUND, STERNUM
Anesthesia: General

## 2018-12-27 MED ORDER — PROPOFOL 10 MG/ML IV BOLUS
INTRAVENOUS | Status: DC | PRN
  Administered 2018-12-27: 120 mg via INTRAVENOUS

## 2018-12-27 MED ORDER — ONDANSETRON HCL 4 MG/2ML IJ SOLN
INTRAMUSCULAR | Status: AC
  Filled 2018-12-27: qty 2

## 2018-12-27 MED ORDER — FENTANYL CITRATE (PF) 250 MCG/5ML IJ SOLN
INTRAMUSCULAR | Status: AC
  Filled 2018-12-27: qty 5

## 2018-12-27 MED ORDER — SODIUM CHLORIDE 0.9 % IV SOLN
INTRAVENOUS | Status: DC | PRN
  Administered 2018-12-27: 20 ug/min via INTRAVENOUS

## 2018-12-27 MED ORDER — OXYCODONE HCL 5 MG/5ML PO SOLN: 5.0000 mg | Freq: Once | ORAL | Status: AC | PRN

## 2018-12-27 MED ORDER — 0.9 % SODIUM CHLORIDE (POUR BTL) OPTIME
TOPICAL | Status: DC | PRN
  Administered 2018-12-27: 2000 mL

## 2018-12-27 MED ORDER — OXYCODONE HCL 5 MG PO TABS
5.0000 mg | ORAL_TABLET | Freq: Once | ORAL | Status: AC | PRN
  Administered 2018-12-27: 5 mg via ORAL

## 2018-12-27 MED ORDER — FENTANYL CITRATE (PF) 100 MCG/2ML IJ SOLN
INTRAMUSCULAR | Status: AC
  Administered 2018-12-27: 50 ug via INTRAVENOUS
  Filled 2018-12-27: qty 2

## 2018-12-27 MED ORDER — LIDOCAINE 2% (20 MG/ML) 5 ML SYRINGE
INTRAMUSCULAR | Status: DC | PRN
  Administered 2018-12-27: 80 mg via INTRAVENOUS

## 2018-12-27 MED ORDER — MIDAZOLAM HCL 2 MG/2ML IJ SOLN
INTRAMUSCULAR | Status: AC
  Filled 2018-12-27: qty 2

## 2018-12-27 MED ORDER — PROPOFOL 10 MG/ML IV BOLUS
INTRAVENOUS | Status: AC
  Filled 2018-12-27: qty 40

## 2018-12-27 MED ORDER — PROMETHAZINE HCL 25 MG/ML IJ SOLN
6.2500 mg | INTRAMUSCULAR | Status: DC | PRN
  Administered 2018-12-27: 6.25 mg via INTRAVENOUS

## 2018-12-27 MED ORDER — CEFAZOLIN SODIUM 1 G IJ SOLR
INTRAMUSCULAR | Status: AC
  Filled 2018-12-27: qty 20

## 2018-12-27 MED ORDER — MIDAZOLAM HCL 2 MG/2ML IJ SOLN
INTRAMUSCULAR | Status: DC | PRN
  Administered 2018-12-27: 2 mg via INTRAVENOUS

## 2018-12-27 MED ORDER — LIDOCAINE 2% (20 MG/ML) 5 ML SYRINGE
INTRAMUSCULAR | Status: AC
  Filled 2018-12-27: qty 5

## 2018-12-27 MED ORDER — ONDANSETRON HCL 4 MG/2ML IJ SOLN
INTRAMUSCULAR | Status: DC | PRN
  Administered 2018-12-27: 4 mg via INTRAVENOUS

## 2018-12-27 MED ORDER — OXYCODONE HCL 5 MG PO TABS
ORAL_TABLET | ORAL | Status: AC
  Filled 2018-12-27: qty 1

## 2018-12-27 MED ORDER — PROMETHAZINE HCL 25 MG/ML IJ SOLN
INTRAMUSCULAR | Status: AC
  Administered 2018-12-27: 6.25 mg via INTRAVENOUS
  Filled 2018-12-27: qty 1

## 2018-12-27 MED ORDER — DEXTROSE 50 % IV SOLN
INTRAVENOUS | Status: AC
  Administered 2018-12-27: 25 mL
  Filled 2018-12-27: qty 50

## 2018-12-27 MED ORDER — FENTANYL CITRATE (PF) 100 MCG/2ML IJ SOLN
INTRAMUSCULAR | Status: DC | PRN
  Administered 2018-12-27: 100 ug via INTRAVENOUS
  Administered 2018-12-27: 50 ug via INTRAVENOUS
  Administered 2018-12-27: 100 ug via INTRAVENOUS

## 2018-12-27 MED ORDER — LACTATED RINGERS IV SOLN
INTRAVENOUS | Status: DC | PRN
  Administered 2018-12-27: 07:00:00 via INTRAVENOUS

## 2018-12-27 MED ORDER — DEXAMETHASONE SODIUM PHOSPHATE 10 MG/ML IJ SOLN
INTRAMUSCULAR | Status: DC | PRN
  Administered 2018-12-27: 5 mg via INTRAVENOUS

## 2018-12-27 MED ORDER — FENTANYL CITRATE (PF) 100 MCG/2ML IJ SOLN
25.0000 ug | INTRAMUSCULAR | Status: DC | PRN
  Administered 2018-12-27: 50 ug via INTRAVENOUS

## 2018-12-27 MED ORDER — DEXAMETHASONE SODIUM PHOSPHATE 10 MG/ML IJ SOLN
INTRAMUSCULAR | Status: AC
  Filled 2018-12-27: qty 1

## 2018-12-27 SURGICAL SUPPLY — 52 items
ATTRACTOMAT 16X20 MAGNETIC DRP (DRAPES) ×3 IMPLANT
BAG DECANTER FOR FLEXI CONT (MISCELLANEOUS) ×3 IMPLANT
BLADE SURG 10 STRL SS (BLADE) ×6 IMPLANT
BNDG GAUZE ELAST 4 BULKY (GAUZE/BANDAGES/DRESSINGS) IMPLANT
CANISTER SUCT 3000ML PPV (MISCELLANEOUS) ×3 IMPLANT
CANISTER WOUNDNEG PRESSURE 500 (CANNISTER) ×2 IMPLANT
CATH THORACIC 28FR RT ANG (CATHETERS) IMPLANT
CATH THORACIC 36FR (CATHETERS) IMPLANT
CATH THORACIC 36FR RT ANG (CATHETERS) IMPLANT
CLIP VESOCCLUDE SM WIDE 24/CT (CLIP) IMPLANT
CONT SPEC 4OZ CLIKSEAL STRL BL (MISCELLANEOUS) IMPLANT
COVER SURGICAL LIGHT HANDLE (MISCELLANEOUS) ×6 IMPLANT
COVER WAND RF STERILE (DRAPES) ×3 IMPLANT
DRAPE LAPAROSCOPIC ABDOMINAL (DRAPES) ×3 IMPLANT
DRSG PAD ABDOMINAL 8X10 ST (GAUZE/BANDAGES/DRESSINGS) ×9 IMPLANT
DRSG VAC ATS SM SENSATRAC (GAUZE/BANDAGES/DRESSINGS) ×2 IMPLANT
ELECT REM PT RETURN 9FT ADLT (ELECTROSURGICAL) ×3
ELECTRODE REM PT RTRN 9FT ADLT (ELECTROSURGICAL) ×1 IMPLANT
GAUZE SPONGE 4X4 12PLY STRL (GAUZE/BANDAGES/DRESSINGS) ×3 IMPLANT
GAUZE XEROFORM 5X9 LF (GAUZE/BANDAGES/DRESSINGS) IMPLANT
GLOVE EUDERMIC 7 POWDERFREE (GLOVE) ×6 IMPLANT
GOWN STRL REUS W/ TWL LRG LVL3 (GOWN DISPOSABLE) ×4 IMPLANT
GOWN STRL REUS W/ TWL XL LVL3 (GOWN DISPOSABLE) ×1 IMPLANT
GOWN STRL REUS W/TWL LRG LVL3 (GOWN DISPOSABLE) ×8
GOWN STRL REUS W/TWL XL LVL3 (GOWN DISPOSABLE) ×2
HANDPIECE INTERPULSE COAX TIP (DISPOSABLE)
HEMOSTAT SURGICEL 2X14 (HEMOSTASIS) IMPLANT
KIT BASIN OR (CUSTOM PROCEDURE TRAY) ×3 IMPLANT
KIT SUCTION CATH 14FR (SUCTIONS) IMPLANT
KIT TURNOVER KIT B (KITS) ×3 IMPLANT
MARKER SKIN DUAL TIP RULER LAB (MISCELLANEOUS) IMPLANT
NS IRRIG 1000ML POUR BTL (IV SOLUTION) ×3 IMPLANT
PACK CHEST (CUSTOM PROCEDURE TRAY) ×3 IMPLANT
PAD ARMBOARD 7.5X6 YLW CONV (MISCELLANEOUS) ×6 IMPLANT
PIN SAFETY STERILE (MISCELLANEOUS) IMPLANT
RUBBERBAND STERILE (MISCELLANEOUS) IMPLANT
SET HNDPC FAN SPRY TIP SCT (DISPOSABLE) IMPLANT
SPONGE LAP 18X18 RF (DISPOSABLE) ×3 IMPLANT
STAPLER VISISTAT 35W (STAPLE) IMPLANT
STRAP MONTGOMERY 1.25X11-1/8 (MISCELLANEOUS) IMPLANT
SUT ETHILON 3 0 FSL (SUTURE) IMPLANT
SUT STEEL 6MS V (SUTURE) IMPLANT
SUT STEEL STERNAL CCS#1 18IN (SUTURE) IMPLANT
SUT STEEL SZ 6 DBL 3X14 BALL (SUTURE) IMPLANT
SUT VIC AB 1 CTX 36 (SUTURE) ×4
SUT VIC AB 1 CTX36XBRD ANBCTR (SUTURE) ×2 IMPLANT
SWAB CULTURE ESWAB REG 1ML (MISCELLANEOUS) IMPLANT
SYR 5ML LL (SYRINGE) IMPLANT
TOWEL GREEN STERILE (TOWEL DISPOSABLE) ×3 IMPLANT
TOWEL GREEN STERILE FF (TOWEL DISPOSABLE) ×3 IMPLANT
TRAY FOLEY MTR SLVR 14FR STAT (SET/KITS/TRAYS/PACK) IMPLANT
WATER STERILE IRR 1000ML POUR (IV SOLUTION) ×3 IMPLANT

## 2018-12-27 NOTE — Anesthesia Postprocedure Evaluation (Signed)
Anesthesia Post Note  Patient: SHENANDOAH NORDLAND  Procedure(s) Performed: DEBRIDEMENT OF CHEST WALL WOUND (N/A ) WOUND VAC CHANGE (N/A )     Patient location during evaluation: PACU Anesthesia Type: General Level of consciousness: awake and alert Pain management: pain level controlled Vital Signs Assessment: post-procedure vital signs reviewed and stable Respiratory status: spontaneous breathing, nonlabored ventilation and respiratory function stable Cardiovascular status: blood pressure returned to baseline and stable Postop Assessment: no apparent nausea or vomiting Anesthetic complications: no    Last Vitals:  Vitals:   12/27/18 0920 12/27/18 0925  BP: 133/83   Pulse: 69   Resp: 17   Temp:  (!) 36.3 C  SpO2: 100%     Last Pain:  Vitals:   12/27/18 0920  TempSrc:   PainSc: Asleep                 Beryle Lathe

## 2018-12-27 NOTE — Progress Notes (Signed)
Report given to [facility] Anesthesia.

## 2018-12-27 NOTE — Op Note (Signed)
CARDIOVASCULAR SURGERY OPERATIVE NOTE  12/27/2018  Surgeon:  Alleen Borne, MD  First Assistant: none   Preoperative Diagnosis: Chest wall and anterior mediastinal abscess   Postoperative Diagnosis:  Same   Procedure: Debridement of chest wall wound and wound VAC change  Anesthesia:  General LMA   Clinical History/Surgical Indication:  The patient is a 53 year old gentleman with history of diabetes, cirrhosis, thrombocytopenia, hepatitis C, and intravenous drug abuse who presented to the emergency room with a large erythematous fluctuant and tender mass in the middle of his right anterior chest with some associated ecchymosis under the left breast as well as in the umbilical region.  He was afebrile and his white blood cell count was normal.  A CT scan of the chest showed a large soft tissue abscess of the anterior chest wall measuring 14.2 x 5.2 x 10.8 cm.  There is no evidence of bony destruction in the ribs or sternum.  There is also a retrosternal abscess measuring 5 x 3.4 x 7.2 cm anterior to the great vessels.  He was admitted for intravenous antibiotics.  His blood cultures grew methicillin sensitive staph aureus.  It was felt that most likely this was due to seeding from the bacteremia related to intravenous drug abuse.    He was taken the operating room on 12/24/2022 incision and drainage of the chest wall abscess which extended into the anterior mediastinum.  A wound VAC was placed at that time.  Cultures have grown methicillin sensitive staph aureus.  He continues on intravenous antibiotics.  If required to return the operating room for further debridement and wound VAC change. The operative procedure was discussed with the patient and his family including alternatives, benefits, and risks including but not limited to bleeding, blood transfusion, need for further debridement, injury to  mediastinal structures possibly requiring median sternotomy, possibility of developing osteomyelitis of the ribs or sternum, and the possibility that there were other sites of infection.  He understood and agreed to proceed.  Preparation:  The patient was seen in the preoperative holding area and the correct patient, correct operation were confirmed with the patient after reviewing the medical record and catheterization. The consent was signed by me.  The patient has been on intravenous antibiotics for his infection.   The patient was taken back to the operating room and positioned supine on the operating room table.  He was placed under general endotracheal anesthesia by the anesthesia team using an LMA. The prior wound VAC sponge was removed.  The neck, chest, and abdomen were prepped with betadine soap and solution and draped in the usual sterile manner. A surgical time-out was taken and the correct patient and operative procedure were confirmed with the nursing and anesthesia staff.  Surgical Procedure:  Examination of the chest wound was performed.  There was still some pus that could be expressed from the chest wall muscles by massaging the chest.  The erythema of the chest wall was resolved.  There was some fibrinous debris present within the wound and this was removed with electrocautery.  The muscles appeared viable.  The extension of the abscess down into the anterior mediastinum through the intercostal space was evaluated.  There not appear to be any residual fluid collection within the mediastinum but a small space.  The wound was irrigated with saline solution.  A small wound VAC sponge was cut to the appropriate shape with an extension that was placed down into the cavity and the entry mediastinum.  The  airtight dressing was applied and the suction line placed.  The suction was turned on and there appeared to be good vacuum with no air leak.  There is complete hemostasis.  The sponge needle  instrument counts were correct according to the scrub nurse.  The patient was awakened and transported to the postanesthesia care unit in satisfactory condition.

## 2018-12-27 NOTE — Progress Notes (Signed)
Pt was very cool, clammy, profusely sweating. Pt stated he is very light headed, BP stable. Pt's CBG was 93. Pt stated its very low for him, according to pt he becomes symptomatic if his blood sugar is below 110-120. Since pt was NPO, 24ml of d50 administered for being symptomatic. Pt stated he never takes his sliding scale for blood sugar <200. CBG recheck was 122. Pt was feeling better. Will continue to monitor,

## 2018-12-27 NOTE — Progress Notes (Signed)
[facility]  [facility] Infusion Coordinator will follow pt with ID team to support home infusion pharmacy services for home IV ABX at DC.    If patient discharges after hours, please call [phone].   Sedalia Muta 12/27/2018, 8:52 PM

## 2018-12-27 NOTE — Transfer of Care (Signed)
Immediate Anesthesia Transfer of Care Note  Patient: Chase Reed  Procedure(s) Performed: DEBRIDEMENT OF CHEST WALL WOUND (N/A ) WOUND VAC CHANGE (N/A )  Patient Location: PACU  Anesthesia Type:General  Level of Consciousness: awake, alert  and oriented  Airway & Oxygen Therapy: Patient Spontanous Breathing and Patient connected to nasal cannula oxygen  Post-op Assessment: Report given to RN, Post -op Vital signs reviewed and stable and Patient moving all extremities  Post vital signs: Reviewed and stable  Last Vitals:  Vitals Value Taken Time  BP 126/85 12/27/2018  8:51 AM  Temp    Pulse 82 12/27/2018  8:52 AM  Resp 15 12/27/2018  8:52 AM  SpO2 99 % 12/27/2018  8:52 AM  Vitals shown include unvalidated device data.  Last Pain:  Vitals:   12/27/18 0600  TempSrc:   PainSc: 5       Patients Stated Pain Goal: 0 (12/25/18 1922)  Complications: No apparent anesthesia complications

## 2018-12-27 NOTE — Progress Notes (Signed)
[facility]    Date of Admission:  12/23/2018   Total days of antibiotics 5        Day 4 cefazolin  ID: Chase Reed is a 53 y.o. male with hx of PWID, MSSA bacteremia, admitted for chest discomfort and found to have large presternal and retrosternal chest wall abscess s/p multiple debridement and wound vac placement Principal Problem:   MSSA bacteremia Active Problems:   Chest wall abscess   Cirrhosis (HCC)    Subjective: 24hr: had repeat debridement and wound vac change this morning. Still evidence of purulent fluid in surrounding tissue .complains of significant pain since returning from or. Denies fever, nightsweats, rigors  Medications:  . acetaminophen  1,000 mg Oral Q6H   Or  . acetaminophen (TYLENOL) oral liquid 160 mg/5 mL  1,000 mg Oral Q6H  . aspirin EC  81 mg Oral Daily  . atorvastatin  10 mg Oral q1800  . DULoxetine  60 mg Oral Daily  . fentaNYL   Intravenous Q4H  . Influenza vac split quadrivalent PF  0.5 mL Intramuscular Tomorrow-1000  . insulin aspart  0-15 Units Subcutaneous Q4H  . insulin glargine  20 Units Subcutaneous BID  . lisinopril  10 mg Oral Daily  . mupirocin ointment  1 application Nasal BID  . oxyCODONE      . pneumococcal 23 valent vaccine  0.5 mL Intramuscular Tomorrow-1000  . senna-docusate  1 tablet Oral QHS    Objective: Vital signs in last 24 hours: Temp:  [97.3 F (36.3 C)-97.9 F (36.6 C)] 97.5 F (36.4 C) (02/24 1705) Pulse Rate:  [67-85] 75 (02/24 1705) Resp:  [11-19] 18 (02/24 1343) BP: (110-144)/(72-87) 134/78 (02/24 1705) SpO2:  [95 %-100 %] 97 % (02/24 1705) Weight:  [84.1 kg] 84.1 kg (02/24 0533) Physical Exam  Constitutional: He is oriented to person, place, and time. He appears well-developed and well-nourished. No distress.  HENT:  Mouth/Throat: Oropharynx is clear and moist. No oropharyngeal exudate.  Cardiovascular: tachy, regular rhythm and normal heart sounds. Exam reveals no gallop  and no friction rub.  No murmur heard.  Chest wall = wound vac eminating from eliptical incision midsternal region. No signficant surrounding erythema Pulmonary/Chest: Effort normal and breath sounds normal. No respiratory distress. He has no wheezes.  Abdominal: Soft. Bowel sounds are normal. He exhibits no distension. There is no tenderness.  Lymphadenopathy:  He has no cervical adenopathy.  Neurological: He is alert and oriented to person, place, and time.  Skin: Skin is warm and dry. No rash noted. No erythema.  Psychiatric: He has a normal mood and affect. His behavior is normal.    Lab Results Recent Labs    12/25/18 0415 12/26/18 0241  WBC 3.9* 2.3*  HGB 9.5* 9.4*  HCT 28.6* 29.2*  NA 129* 132*  K 3.5 3.7  CL 96* 98  CO2 24 24  BUN 10 8  CREATININE 0.60* 0.62   Liver Panel Recent Labs    12/26/18 0241  PROT 6.4*  ALBUMIN 2.0*  AST 15  ALT 9  ALKPHOS 63  BILITOT 0.5   No results found for: ESRSEDRATE, POCTSEDRATE  Microbiology: 2/22 blood cx ngtd 2/21 aerobic cx mssa 2/20 blood cx mssa Studies/Results: No results found. TTE = no vegetation  Assessment/Plan: Complicated MSSA bacteremia with pre and retrosternal abscess  S/p debridement = continue on cefazolin 2gm IV q 8hr. For now would recommend to continue for minimum of 4 wks. Would like to  arrange for TEE while he goes back down to the OR at next debridement - to see if better visualization of his heart valve.  If unable to do TEE - I would recommend to treat for presumed endocarditis and plan to treat for  6 wks.  Will check sed rate and crp  Use 2/22 as day 1 since that is the documented day of clearance.  Chronic hep c = can treat as an outpatient   [facility] Cell: [phone] Pager: [phone]  12/27/2018, 5:25 PM
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
## 2018-12-27 NOTE — Brief Op Note (Signed)
12/27/2018  8:51 AM  PATIENT:  Chase Reed  53 y.o. male  PRE-OPERATIVE DIAGNOSIS:  chest wall abscess  POST-OPERATIVE DIAGNOSIS:  chest wall abscess  PROCEDURE:  Procedure(s): DEBRIDEMENT OF CHEST WALL WOUND (N/A) WOUND VAC CHANGE (N/A)  SURGEON:  Surgeon(s) and Role:    * Jessalynn Mccowan, Payton Doughty, MD - Primary  PHYSICIAN ASSISTANT:   ASSISTANTS: none   ANESTHESIA:   general by LMA  EBL:  Minimal  BLOOD ADMINISTERED:none  DRAINS: wound VAC   LOCAL MEDICATIONS USED:  NONE  SPECIMEN:  No Specimen  DISPOSITION OF SPECIMEN:  N/A  COUNTS:  YES  TOURNIQUET:  * No tourniquets in log *  DICTATION: .Note written in EPIC  PLAN OF CARE: Admit to inpatient   PATIENT DISPOSITION:  PACU - hemodynamically stable.   Delay start of Pharmacological VTE agent (>24hrs) due to surgical blood loss or risk of bleeding: no

## 2018-12-27 NOTE — Progress Notes (Signed)
Pt leaving for OR, Fentanyl PCA d/ced. verified by two RNs, Wasted 21ml with Jana Half RN. Transferred pt to OR via bed with OR staff and myself. CCMD notified. Pt in or holding room 37.

## 2018-12-27 NOTE — Anesthesia Procedure Notes (Signed)
Procedure Name: LMA Insertion Date/Time: 12/27/2018 7:34 AM Performed by: Jodell Cipro, CRNA Pre-anesthesia Checklist: Patient identified, Emergency Drugs available, Suction available and Patient being monitored Patient Re-evaluated:Patient Re-evaluated prior to induction Oxygen Delivery Method: Circle System Utilized Preoxygenation: Pre-oxygenation with 100% oxygen Induction Type: IV induction Ventilation: Mask ventilation without difficulty LMA: LMA inserted LMA Size: 4.0 Number of attempts: 1 Placement Confirmation: positive ETCO2 Tube secured with: Tape Dental Injury: Teeth and Oropharynx as per pre-operative assessment

## 2018-12-28 ENCOUNTER — Encounter (HOSPITAL_COMMUNITY): Payer: Self-pay | Admitting: Surgery

## 2018-12-28 ENCOUNTER — Inpatient Hospital Stay: Payer: Self-pay

## 2018-12-28 LAB — TYPE AND SCREEN
ABO/RH(D): O POS
Antibody Screen: NEGATIVE
Unit division: 0
Unit division: 0

## 2018-12-28 LAB — GLUCOSE, CAPILLARY
Glucose-Capillary: 108 mg/dL — ABNORMAL HIGH (ref 70–99)
Glucose-Capillary: 127 mg/dL — ABNORMAL HIGH (ref 70–99)
Glucose-Capillary: 132 mg/dL — ABNORMAL HIGH (ref 70–99)
Glucose-Capillary: 162 mg/dL — ABNORMAL HIGH (ref 70–99)
Glucose-Capillary: 166 mg/dL — ABNORMAL HIGH (ref 70–99)
Glucose-Capillary: 179 mg/dL — ABNORMAL HIGH (ref 70–99)
Glucose-Capillary: 90 mg/dL (ref 70–99)

## 2018-12-28 LAB — BPAM RBC
Blood Product Expiration Date: 202003192359
Blood Product Expiration Date: 202003192359
Unit Type and Rh: 5100
Unit Type and Rh: 5100

## 2018-12-28 MED ORDER — SODIUM CHLORIDE 0.9% FLUSH: 10.0000 mL | INTRAVENOUS | Status: DC | PRN

## 2018-12-28 MED ORDER — INSULIN GLARGINE 100 UNIT/ML ~~LOC~~ SOLN
15.0000 [IU] | Freq: Every day | SUBCUTANEOUS | Status: DC
  Administered 2018-12-29 – 2019-01-04 (×7): 15 [IU] via SUBCUTANEOUS
  Filled 2018-12-28 (×8): qty 0.15

## 2018-12-28 MED ORDER — METFORMIN HCL 500 MG PO TABS
1000.0000 mg | ORAL_TABLET | Freq: Two times a day (BID) | ORAL | Status: DC
  Administered 2018-12-28 – 2019-01-25 (×53): 1000 mg via ORAL
  Filled 2018-12-28 (×53): qty 2

## 2018-12-28 MED ORDER — SODIUM CHLORIDE 0.9% FLUSH
10.0000 mL | Freq: Two times a day (BID) | INTRAVENOUS | Status: DC
  Administered 2018-12-28 – 2019-01-12 (×19): 10 mL
  Administered 2019-01-13 – 2019-01-15 (×3): 20 mL
  Administered 2019-01-16 – 2019-01-18 (×5): 10 mL
  Administered 2019-01-19: 20 mL
  Administered 2019-01-19 – 2019-01-24 (×11): 10 mL

## 2018-12-28 NOTE — Progress Notes (Addendum)
      [address]       [place] [address]             [phone]      1 Day Post-Op Procedure(s) (LRB): DEBRIDEMENT OF CHEST WALL WOUND (N/A) WOUND VAC CHANGE (N/A)   Subjective:  No new complaints.  Feeling a little better.  Continues to have pain at infection site.  Objective: Vital signs in last 24 hours: Temp:  [97.4 F (36.3 C)-97.9 F (36.6 C)] 97.4 F (36.3 C) (02/25 0637) Pulse Rate:  [68-76] 68 (02/25 0637) Cardiac Rhythm: Normal sinus rhythm (02/25 0700) Resp:  [12-18] 18 (02/25 0800) BP: (126-145)/(78-93) 145/88 (02/25 0637) SpO2:  [93 %-98 %] 97 % (02/25 0800)  Intake/Output from previous day: 02/24 0701 - 02/25 0700 In: 1060 [P.O.:360; I.V.:700] Out: 3190 [Urine:3175; Blood:15]  General appearance: alert, cooperative and no distress Heart: regular rate and rhythm Lungs: clear to auscultation bilaterally Abdomen: soft, non-tender; bowel sounds normal; no masses,  no organomegaly Extremities: extremities normal, atraumatic, no cyanosis or edema Wound: wound vac in place, no surrounding erythema present  Lab Results: Recent Labs    12/26/18 0241  WBC 2.3*  HGB 9.4*  HCT 29.2*  PLT 75*   BMET:  Recent Labs    12/26/18 0241  NA 132*  K 3.7  CL 98  CO2 24  GLUCOSE 165*  BUN 8  CREATININE 0.62  CALCIUM 7.9*    PT/INR: No results for input(s): LABPROT, INR in the last 72 hours. ABG    Component Value Date/Time   PHART 7.509 (H) 12/24/2018 0311   HCO3 24.7 12/24/2018 0311   O2SAT 94.5 12/24/2018 0311   CBG (last 3)  Recent Labs    12/28/18 0000 12/28/18 0438 12/28/18 0826  GLUCAP 179* 132* 90    Assessment/Plan: S/P Procedure(s) (LRB): DEBRIDEMENT OF CHEST WALL WOUND (N/A) WOUND VAC CHANGE (N/A)  1. Chest wall abscess- S/P Debridement x 2, for wound vac change tomorrow, vs further debridement 2. ID-MSSA bacteria, OR cultures positive for Staph- WBC count is low at 2.3, afebrile- on ABX per ID  recommendations 3. CV- NSR, BP elevated at times- on Lisinopril at 10 mg daily 4. DM- sugars are ranging from 90-300s- will change lantus to home regimen of 15U daily, restart Metformin and will adjust medications further as need arises 5. Dispo- patient stable, he will require vac change tomorrow vs. Repeat OR debridement, continue IV ABX will require 6 weeks of therapy will place order for PICC, continue current care  LOS: 4 days    [name] 12/28/2018    Chart reviewed, patient examined, agree with above. Plan VAC change tomorrow by [facility] nurse. Continue antibiotic per ID.

## 2018-12-28 NOTE — Care Management Note (Signed)
Case Management Note Donn Pierini RN, BSN Transitions of Care Unit 4E- RN Case Manager [phone]  Patient Details  Name: Chase Reed MRN: [id number] Date of Birth: 21-Dec-1965  Subjective/Objective:   Pt admitted with chest wall abscess s/p I&D x2 with wound VAC placement, ID consulted for IV abx recommendations.                 Action/Plan: PTA pt lived at home, hx IVDU, per ID initial consult  Pt would not be a candidate for PICC line and home IV abx due to IVDU history. Most likely will need IV abx 4-6 wks. Per CVTS they also agree with ID that pt will not be a home abx candidate- will need to stay in house for duration of treatment as he is not a SNF candidate either due to history. CM will follow for transition of care needs.  Expected Discharge Date:                  Expected Discharge Plan:  Home/Self Care  In-House Referral:  Clinical Social Work  Discharge planning Services  CM Consult  Post Acute Care Choice:    Choice offered to:     DME Arranged:    DME Agency:     HH Arranged:    HH Agency:     Status of Service:  In process, will continue to follow  If discussed at Long Length of Stay Meetings, dates discussed:    Discharge Disposition:   Additional Comments:  Darrold Span, RN 12/28/2018, 12:27 PM

## 2018-12-28 NOTE — Progress Notes (Signed)
Peripherally Inserted Central Catheter/Midline Placement  The IV Nurse has discussed with the patient and/or persons authorized to consent for the patient, the purpose of this procedure and the potential benefits and risks involved with this procedure.  The benefits include less needle sticks, lab draws from the catheter, and the patient may be discharged home with the catheter. Risks include, but not limited to, infection, bleeding, blood clot (thrombus formation), and puncture of an artery; nerve damage and irregular heartbeat and possibility to perform a PICC exchange if needed/ordered by physician.  Alternatives to this procedure were also discussed.  Bard Power PICC patient education guide, fact sheet on infection prevention and patient information card has been provided to patient /or left at bedside.    PICC/Midline Placement Documentation        Chase Reed 12/28/2018, 6:10 PM

## 2018-12-29 LAB — AEROBIC/ANAEROBIC CULTURE W GRAM STAIN (SURGICAL/DEEP WOUND)

## 2018-12-29 LAB — GLUCOSE, CAPILLARY
Glucose-Capillary: 103 mg/dL — ABNORMAL HIGH (ref 70–99)
Glucose-Capillary: 136 mg/dL — ABNORMAL HIGH (ref 70–99)
Glucose-Capillary: 132 mg/dL — ABNORMAL HIGH (ref 70–99)
Glucose-Capillary: 135 mg/dL — ABNORMAL HIGH (ref 70–99)
Glucose-Capillary: 179 mg/dL — ABNORMAL HIGH (ref 70–99)

## 2018-12-29 MED ORDER — OXYCODONE HCL 5 MG PO TABS
10.0000 mg | ORAL_TABLET | ORAL | Status: DC | PRN
  Administered 2018-12-29 (×5): 15 mg via ORAL
  Administered 2018-12-30: 10 mg via ORAL
  Administered 2018-12-30 – 2019-01-12 (×73): 15 mg via ORAL
  Administered 2019-01-12: 10 mg via ORAL
  Administered 2019-01-12 – 2019-01-19 (×43): 15 mg via ORAL
  Administered 2019-01-19 – 2019-01-20 (×2): 10 mg via ORAL
  Administered 2019-01-20: 15 mg via ORAL
  Administered 2019-01-20: 10 mg via ORAL
  Administered 2019-01-20 (×2): 15 mg via ORAL
  Administered 2019-01-20: 10 mg via ORAL
  Administered 2019-01-21 – 2019-01-25 (×22): 15 mg via ORAL
  Filled 2018-12-29 (×59): qty 3
  Filled 2018-12-29: qty 2
  Filled 2018-12-29 (×15): qty 3
  Filled 2018-12-29: qty 2
  Filled 2018-12-29 (×23): qty 3
  Filled 2018-12-29: qty 2
  Filled 2018-12-29 (×5): qty 3
  Filled 2018-12-29: qty 2
  Filled 2018-12-29 (×35): qty 3
  Filled 2018-12-29: qty 2
  Filled 2018-12-29 (×9): qty 3

## 2018-12-29 MED ORDER — FENTANYL CITRATE (PF) 100 MCG/2ML IJ SOLN
12.5000 ug | INTRAMUSCULAR | Status: DC | PRN
  Administered 2018-12-29 – 2019-01-12 (×31): 25 ug via INTRAVENOUS
  Filled 2018-12-29 (×31): qty 2

## 2018-12-29 MED ORDER — KETOROLAC TROMETHAMINE 30 MG/ML IJ SOLN
30.0000 mg | Freq: Once | INTRAMUSCULAR | Status: AC
  Administered 2018-12-29: 30 mg via INTRAVENOUS
  Filled 2018-12-29: qty 1

## 2018-12-29 NOTE — Progress Notes (Signed)
Fentanyl PCA syringe wasted upon d/c of pump in stericycle.  Witnessed x 2 RN's 8cc.  Marcelino Duster, RN

## 2018-12-29 NOTE — Progress Notes (Signed)
Pt asked nurse to turn PCA pump off. Patient stated that the dose per administration was too small and not working to manage his pain. I will pass this information on to day shift RN. Will continue to monitor. Victorino December, RN

## 2018-12-29 NOTE — Progress Notes (Addendum)
      [address]       Ch[place] [address]             [phone]      2 Days Post-Op Procedure(s) (LRB): DEBRIDEMENT OF CHEST WALL WOUND (N/A) WOUND VAC CHANGE (N/A)   Subjective:  Patient is having pain this morning.  He is not getting relief with pain medications.  Objective: Vital signs in last 24 hours: Temp:  [97.6 F (36.4 C)-98.6 F (37 C)] 97.6 F (36.4 C) (02/26 0756) Pulse Rate:  [70-75] 75 (02/26 0756) Cardiac Rhythm: Normal sinus rhythm (02/25 2030) Resp:  [9-19] 12 (02/26 0426) BP: (132-145)/(79-98) 132/86 (02/26 0756) SpO2:  [96 %-98 %] 96 % (02/26 0426)  Intake/Output from previous day: 02/25 0701 - 02/26 0700 In: 910 [P.O.:240; IV Piggyback:670] Out: 900 [Urine:775; Drains:125] Intake/Output this shift: Total I/O In: -  Out: 1000 [Urine:1000]  General appearance: alert, cooperative and no distress Heart: regular rate and rhythm Lungs: clear to auscultation bilaterally Abdomen: soft, non-tender; bowel sounds normal; no masses,  no organomegaly Extremities: extremities normal, atraumatic, no cyanosis or edema Wound: wound vac in place, no surrounding erythema  Lab Results: No results for input(s): WBC, HGB, HCT, PLT in the last 72 hours. BMET: No results for input(s): NA, K, CL, CO2, GLUCOSE, BUN, CREATININE, CALCIUM in the last 72 hours.  PT/INR: No results for input(s): LABPROT, INR in the last 72 hours. ABG    Component Value Date/Time   PHART 7.509 (H) 12/24/2018 0311   HCO3 24.7 12/24/2018 0311   O2SAT 94.5 12/24/2018 0311   CBG (last 3)  Recent Labs    12/28/18 2340 12/29/18 0423 12/29/18 0759  GLUCAP 127* 103* 136*    Assessment/Plan: S/P Procedure(s) (LRB): DEBRIDEMENT OF CHEST WALL WOUND (N/A) WOUND VAC CHANGE (N/A)  1. Chest wall abscess- wound vac for change today by WOC 2. ID- MSSA bacteremia, OR cultures positive for staph, Picc line has been placed, continue IV ABX per ID will require 6 weeks of  ABX 3. CV- hemodynamically stable 4. Pain control- H/O of heroin abuse, no relief with PCA, will discontinued, increase Oxy to 10-15 mg every 3 hours, 12.5-25 mcg fentanyl for breakthrough pain, will also try 1 dose of Toradol, will titrate medications as need to get relief 5. DM-cbgs improved, continue current insulin regimen 6. DIspo- patient stable, for bedside wound vac change today, continue ABX, work on pain relief, continue current care   LOS: 5 days    [name] 12/29/2018     Chart reviewed, patient examined, agree with above. Still has fair amount of chest wall pain. No erythema or swelling. WOC nurse changed VAC dressing this am. Continue IV antibiotic and MWF VAC dressing change. Can't go home with PICC due to IV drug abuse hx.

## 2018-12-29 NOTE — Consult Note (Signed)
WOC Nurse wound consult note Patient receiving care in [facility] 4E27.  No family present. Primary RN, Marcelino Duster, present and medicated patient with IV pain med prior to beginning VAC change to chest.  Patient responded very well to coaching on breathing through the procedure and tolerated it well. One piece of black foam removed, one piece (t-shaped to fit down into deep cavity) placed. Reason for Consult: Sternal wound VAC change Wound type: Infectious area previously debrided twice. Measurement: 2.3 cm x 6.6 cm x 3.6 cm Wound bed: Some yellow fibrinous material, otherwise, pink Drainage (amount, consistency, odor) serosanginous colored in cannister Periwound: intact Dressing procedure/placement/frequency: MWF VAC change by WOC team.   Helmut Muster, RN, MSN, CWOCN, CNS-BC, pager [phone]

## 2018-12-29 NOTE — Progress Notes (Signed)
Pt scheduled for q4 CBGs. Patient CBG at 2330 was 127. According to SSI, patient to receive 2 units short acting insulin. Patient refused insulin administration citing that this number was normal for him and he believed any insulin would drop his blood sugar too low and make him sick. Will continue to monitor. Victorino December, RN

## 2018-12-30 LAB — BASIC METABOLIC PANEL
ANION GAP: 7 (ref 5–15)
BUN: 13 mg/dL (ref 6–20)
CO2: 26 mmol/L (ref 22–32)
Calcium: 8.8 mg/dL — ABNORMAL LOW (ref 8.9–10.3)
Chloride: 101 mmol/L (ref 98–111)
Creatinine, Ser: 0.76 mg/dL (ref 0.61–1.24)
GFR calc Af Amer: >60 mL/min (ref >60)
GFR calc non Af Amer: >60 mL/min (ref >60)
Glucose, Bld: 150 mg/dL — ABNORMAL HIGH (ref 70–99)
Potassium: 4.3 mmol/L (ref 3.5–5.1)
Sodium: 134 mmol/L — ABNORMAL LOW (ref 135–145)

## 2018-12-30 LAB — GLUCOSE, CAPILLARY
Glucose-Capillary: 133 mg/dL — ABNORMAL HIGH (ref 70–99)
Glucose-Capillary: 137 mg/dL — ABNORMAL HIGH (ref 70–99)
Glucose-Capillary: 139 mg/dL — ABNORMAL HIGH (ref 70–99)
Glucose-Capillary: 149 mg/dL — ABNORMAL HIGH (ref 70–99)
Glucose-Capillary: 165 mg/dL — ABNORMAL HIGH (ref 70–99)
Glucose-Capillary: 70 mg/dL (ref 70–99)

## 2018-12-30 LAB — CBC
HCT: 32.5 % — ABNORMAL LOW (ref 39.0–52.0)
Hemoglobin: 10.5 g/dL — ABNORMAL LOW (ref 13.0–17.0)
MCH: 29.6 pg (ref 26.0–34.0)
MCHC: 32.3 g/dL (ref 30.0–36.0)
MCV: 91.5 fL (ref 80.0–100.0)
NRBC: 0 % (ref 0.0–0.2)
Platelets: 95 10*3/uL — ABNORMAL LOW (ref 150–400)
RBC: 3.55 MIL/uL — ABNORMAL LOW (ref 4.22–5.81)
RDW: 13.3 % (ref 11.5–15.5)
WBC: 2.7 10*3/uL — ABNORMAL LOW (ref 4.0–10.5)

## 2018-12-30 LAB — CULTURE, BLOOD (ROUTINE X 2): Culture: NO GROWTH

## 2018-12-30 NOTE — Progress Notes (Signed)
      [address]       [place] [address]             [phone]      3 Days Post-Op Procedure(s) (LRB): DEBRIDEMENT OF CHEST WALL WOUND (N/A) WOUND VAC CHANGE (N/A)   Subjective:  No new complaints.  Pain is under better control.  Objective: Vital signs in last 24 hours: Temp:  [97.5 F (36.4 C)-98.3 F (36.8 C)] 97.7 F (36.5 C) (02/27 0431) Pulse Rate:  [65-78] 65 (02/27 0431) Cardiac Rhythm: Normal sinus rhythm (02/26 1900) Resp:  [12-18] 12 (02/27 0431) BP: (123-146)/(83-97) 123/83 (02/27 0431) SpO2:  [97 %-100 %] 100 % (02/27 0431)  Intake/Output from previous day: 02/26 0701 - 02/27 0700 In: 229.2 [IV Piggyback:229.2] Out: 2250 [Urine:2150; Drains:100]  General appearance: alert, cooperative and no distress Heart: regular rate and rhythm Lungs: clear to auscultation bilaterally Abdomen: soft, non-tender; bowel sounds normal; no masses,  no organomegaly Extremities: extremities normal, atraumatic, no cyanosis or edema Wound: wound vac in place, no surrounding erythema  Lab Results: Recent Labs    12/30/18 0420  WBC 2.7*  HGB 10.5*  HCT 32.5*  PLT 95*   BMET:  Recent Labs    12/30/18 0420  NA 134*  K 4.3  CL 101  CO2 26  GLUCOSE 150*  BUN 13  CREATININE 0.76  CALCIUM 8.8*    PT/INR: No results for input(s): LABPROT, INR in the last 72 hours. ABG    Component Value Date/Time   PHART 7.509 (H) 12/24/2018 0311   HCO3 24.7 12/24/2018 0311   O2SAT 94.5 12/24/2018 0311   CBG (last 3)  Recent Labs    12/29/18 2004 12/30/18 0018 12/30/18 0423  GLUCAP 179* 70 149*    Assessment/Plan: S/P Procedure(s) (LRB): DEBRIDEMENT OF CHEST WALL WOUND (N/A) WOUND VAC CHANGE (N/A)  1. Chest wall abscess- wound vac in place, plan for change tomorrow 2. ID-MSSA Bacteremia- continue IV ABX per ID, will require 6 weeks of therapy 3. CV- hemodynamically stable 4. Pain control- H/O heroin abuse, pain control improved, continue current  regimen 5. DM- cbgs controlled 6. Dispo- patient stable, pain control better, wound vac change tomorrow, continue current care   LOS: 6 days    [name] 12/30/2018

## 2018-12-31 LAB — GLUCOSE, CAPILLARY
Glucose-Capillary: 101 mg/dL — ABNORMAL HIGH (ref 70–99)
Glucose-Capillary: 121 mg/dL — ABNORMAL HIGH (ref 70–99)
Glucose-Capillary: 121 mg/dL — ABNORMAL HIGH (ref 70–99)
Glucose-Capillary: 150 mg/dL — ABNORMAL HIGH (ref 70–99)
Glucose-Capillary: 156 mg/dL — ABNORMAL HIGH (ref 70–99)
Glucose-Capillary: 197 mg/dL — ABNORMAL HIGH (ref 70–99)

## 2018-12-31 NOTE — Progress Notes (Signed)
      [address]       [place] [address]             [phone]      4 Days Post-Op Procedure(s) (LRB): DEBRIDEMENT OF CHEST WALL WOUND (N/A) WOUND VAC CHANGE (N/A)   Subjective:  No new complaints.  Pain remains under control.  Not looking forward to bedside wound vac change today.  Objective: Vital signs in last 24 hours: Temp:  [97.6 F (36.4 C)-98 F (36.7 C)] 98 F (36.7 C) (02/28 0410) Pulse Rate:  [61-79] 64 (02/28 0410) Cardiac Rhythm: Normal sinus rhythm (02/28 0410) Resp:  [10-18] 15 (02/28 0410) BP: (116-128)/(83-90) 116/89 (02/28 0410) SpO2:  [97 %-100 %] 98 % (02/28 0410)  Intake/Output from previous day: 02/27 0701 - 02/28 0700 In: 1268.8 [P.O.:960; IV Piggyback:308.8] Out: 2700 [Urine:2700]  General appearance: alert, cooperative and no distress Heart: regular rate and rhythm Lungs: clear to auscultation bilaterally Abdomen: soft, non-tender; bowel sounds normal; no masses,  no organomegaly Extremities: extremities normal, atraumatic, no cyanosis or edema Wound: vac in place, no surrounding eryhthema  Lab Results: Recent Labs    12/30/18 0420  WBC 2.7*  HGB 10.5*  HCT 32.5*  PLT 95*   BMET:  Recent Labs    12/30/18 0420  NA 134*  K 4.3  CL 101  CO2 26  GLUCOSE 150*  BUN 13  CREATININE 0.76  CALCIUM 8.8*    PT/INR: No results for input(s): LABPROT, INR in the last 72 hours. ABG    Component Value Date/Time   PHART 7.509 (H) 12/24/2018 0311   HCO3 24.7 12/24/2018 0311   O2SAT 94.5 12/24/2018 0311   CBG (last 3)  Recent Labs    12/30/18 2053 12/31/18 0042 12/31/18 0416  GLUCAP 165* 156* 121*    Assessment/Plan: S/P Procedure(s) (LRB): DEBRIDEMENT OF CHEST WALL WOUND (N/A) WOUND VAC CHANGE (N/A)  1. Chest wall abscess- for vac change today 2. ID- MSSA Bactermia- continue IV ABX per ID, 3. Chronic Hep C- treatment as outpatient per ID 4. Pain control- pain is well controlled on current regimen, will  taper oral medicines prior to discharge, IV ordered for vac changes only 5. CV-hemodynamically stable, continue Lisinopril 6. Dispo- patient stable, vac change today, continue IV ABX   LOS: 7 days    [name] 12/31/2018

## 2018-12-31 NOTE — Progress Notes (Addendum)
CSW spoke with the patient at bedside. He was alert and oriented. Patient states he attends [facility].  He will restart again once he is discharged from the hospital. CSW offered additional inpatient and outpatient treatment centers as additional resources. Patient was receptive and appreciative of information given.    Antony Blackbird, [facility] Clinical Social Worker [phone]

## 2018-12-31 NOTE — Consult Note (Signed)
WOC Nurse wound follow up Patient receiving care in [facility] 4E27.  No family present.  Patient medicated by primary RN prior to sternal VAC dressing change. One T shaped piece of black foam removed, one piece of T shaped black foam placed.  Today the deepest area at the very top of the wound appears more shallow. Wound type: Surgical Measurement: deferred, measured 2 days ago Wound bed: 50% pink, 50% yellow/white.  Some of this is bone, the remainder is possibly slough Drainage (amount, consistency, odor) no odor, no induration. Serosanginous in cannister Periwound: intact Dressing procedure/placement/frequency: MWF NPWT by WOC nurse.  Helmut Muster, RN, MSN, CWOCN, CNS-BC, pager [phone]

## 2019-01-01 LAB — GLUCOSE, CAPILLARY
Glucose-Capillary: 105 mg/dL — ABNORMAL HIGH (ref 70–99)
Glucose-Capillary: 117 mg/dL — ABNORMAL HIGH (ref 70–99)
Glucose-Capillary: 125 mg/dL — ABNORMAL HIGH (ref 70–99)
Glucose-Capillary: 131 mg/dL — ABNORMAL HIGH (ref 70–99)
Glucose-Capillary: 169 mg/dL — ABNORMAL HIGH (ref 70–99)
Glucose-Capillary: 174 mg/dL — ABNORMAL HIGH (ref 70–99)

## 2019-01-01 MED ORDER — LACTULOSE 10 GM/15ML PO SOLN: 20.0000 g | Freq: Every day | ORAL | Status: DC | PRN

## 2019-01-01 NOTE — Progress Notes (Signed)
    [facility] has been requested to perform a transesophageal echocardiogram on Chase Reed for bacteremia.  After careful review of history and examination, the risks and benefits of transesophageal echocardiogram have been explained including risks of esophageal damage, perforation (1:10,000 risk), bleeding, pharyngeal hematoma as well as other potential complications associated with conscious sedation including aspiration, arrhythmia, respiratory failure and death. Alternatives to treatment were discussed, questions were answered. Patient is willing to proceed.  TEE - Dr. Royann Shivers 01/03/19 @ 1200 . NPO after midnight. Meds with sips.   Manson Passey, PA-C 01/01/2019 12:59 PM

## 2019-01-01 NOTE — Progress Notes (Addendum)
      [address]       [place] [address]             [phone]      5 Days Post-Op Procedure(s) (LRB): DEBRIDEMENT OF CHEST WALL WOUND (N/A) WOUND VAC CHANGE (N/A)   Subjective:  No new complaints.  Vac change yesterday was uncomfortable.  Objective: Vital signs in last 24 hours: Temp:  [97.7 F (36.5 C)-98.1 F (36.7 C)] 97.7 F (36.5 C) (02/29 0421) Pulse Rate:  [63-73] 64 (02/28 2051) Cardiac Rhythm: Normal sinus rhythm (02/28 2030) Resp:  [15-18] 15 (02/28 2051) BP: (109-121)/(79-88) 109/79 (02/29 0024) SpO2:  [98 %-100 %] 100 % (02/28 2051) Weight:  [76.1 kg] 76.1 kg (02/29 0421)  Intake/Output from previous day: 02/28 0701 - 02/29 0700 In: 240 [P.O.:240] Out: 1640 [Urine:1500; Drains:140]  General appearance: alert, cooperative and no distress Heart: regular rate and rhythm Lungs: clear to auscultation bilaterally Abdomen: soft, non-tender; bowel sounds normal; no masses,  no organomegaly Extremities: extremities normal, atraumatic, no cyanosis or edema Wound: wound vac in place, no surrouding erythema, cannister with serosanguinous output  Lab Results: Recent Labs    12/30/18 0420  WBC 2.7*  HGB 10.5*  HCT 32.5*  PLT 95*   BMET:  Recent Labs    12/30/18 0420  NA 134*  K 4.3  CL 101  CO2 26  GLUCOSE 150*  BUN 13  CREATININE 0.76  CALCIUM 8.8*    PT/INR: No results for input(s): LABPROT, INR in the last 72 hours. ABG    Component Value Date/Time   PHART 7.509 (H) 12/24/2018 0311   HCO3 24.7 12/24/2018 0311   O2SAT 94.5 12/24/2018 0311   CBG (last 3)  Recent Labs    12/31/18 2055 01/01/19 0022 01/01/19 0423  GLUCAP 121* 169* 105*    Assessment/Plan: S/P Procedure(s) (LRB): DEBRIDEMENT OF CHEST WALL WOUND (N/A) WOUND VAC CHANGE (N/A)  1. Chest wall abscess- next vac change Monday 2. ID- remains afebrile, ABX per ID for MSSA bacteremia... he is scheduled for TEE next week to check for endocarditis 3.  Chronic Hep C- treatment will be deferred to outpatient 4. CV- hemodynamically stable, HTN controlled- continue Lisinopril 5. Dispo- patient stable, next vac change Monday, TEE sometime next week, continue ABX for 6 weeks of therapy   LOS: 8 days    [name] 01/01/2019   I have seen and examined Chase Reed and agree with the above assessment  and plan.  Delight Ovens MD Beeper [phone] Office [phone] 01/01/2019 12:24 PM

## 2019-01-02 LAB — GLUCOSE, CAPILLARY
Glucose-Capillary: 103 mg/dL — ABNORMAL HIGH (ref 70–99)
Glucose-Capillary: 117 mg/dL — ABNORMAL HIGH (ref 70–99)
Glucose-Capillary: 138 mg/dL — ABNORMAL HIGH (ref 70–99)
Glucose-Capillary: 173 mg/dL — ABNORMAL HIGH (ref 70–99)
Glucose-Capillary: 214 mg/dL — ABNORMAL HIGH (ref 70–99)
Glucose-Capillary: 295 mg/dL — ABNORMAL HIGH (ref 70–99)

## 2019-01-02 MED ORDER — GABAPENTIN 300 MG PO CAPS
600.0000 mg | ORAL_CAPSULE | Freq: Three times a day (TID) | ORAL | Status: DC
  Administered 2019-01-02 – 2019-01-25 (×67): 600 mg via ORAL
  Filled 2019-01-02 (×67): qty 2

## 2019-01-02 NOTE — Progress Notes (Addendum)
      [address]       [place] [address]             [phone]      6 Days Post-Op Procedure(s) (LRB): DEBRIDEMENT OF CHEST WALL WOUND (N/A) WOUND VAC CHANGE (N/A)   Subjective:  Patient with pain this morning.   States he has been waiting quite a while for his pain medication.  He is also hungry, but has been unable to get through to order his breakfast.  Objective: Vital signs in last 24 hours: Temp:  [97.6 F (36.4 C)-98.1 F (36.7 C)] 97.6 F (36.4 C) (03/01 0751) Pulse Rate:  [74-77] 77 (03/01 0751) Cardiac Rhythm: Normal sinus rhythm (03/01 0700) Resp:  [10-14] 13 (03/01 0751) BP: (87-127)/(60-90) 87/60 (03/01 0751) SpO2:  [97 %-100 %] 99 % (03/01 0751) Weight:  [76 kg] 76 kg (03/01 0329)  Intake/Output from previous day: 02/29 0701 - 03/01 0700 In: -  Out: 900 [Urine:900] Intake/Output this shift: Total I/O In: -  Out: 275 [Urine:275]  General appearance: alert, cooperative and no distress Heart: regular rate and rhythm Lungs: clear to auscultation bilaterally Abdomen: soft, non-tender; bowel sounds normal; no masses,  no organomegaly Extremities: extremities normal, atraumatic, no cyanosis or edema Wound: wound vac in place, no erythema present  Lab Results: No results for input(s): WBC, HGB, HCT, PLT in the last 72 hours. BMET: No results for input(s): NA, K, CL, CO2, GLUCOSE, BUN, CREATININE, CALCIUM in the last 72 hours.  PT/INR: No results for input(s): LABPROT, INR in the last 72 hours. ABG    Component Value Date/Time   PHART 7.509 (H) 12/24/2018 0311   HCO3 24.7 12/24/2018 0311   O2SAT 94.5 12/24/2018 0311   CBG (last 3)  Recent Labs    01/02/19 0009 01/02/19 0327 01/02/19 0801  GLUCAP 117* 103* 295*    Assessment/Plan: S/P Procedure(s) (LRB): DEBRIDEMENT OF CHEST WALL WOUND (N/A) WOUND VAC CHANGE (N/A)  1. Chest wall abscess- for wound vac change tomorrow 2. ID- afebrile, continue IV ABX per ID, + MSSA  bacteremia will require 6 weeks of total therapy 3. CV- hemodynamically stable on home Lisinopril, for TEE tomorrow to assess for valvular vegetations 4. Chronic Hep C- defer to outpatient for treatment 5. Dispo- patient stable, wound vac change tomorrow, TEE tomorrow, NPO at midnight, continue ABX   LOS: 9 days    Chase Reed 01/02/2019  I have seen and examined Chase Reed and agree with the above assessment  and plan.  Delight Ovens MD Beeper [phone] Office [phone] 01/02/2019 1:47 PM

## 2019-01-03 ENCOUNTER — Inpatient Hospital Stay (HOSPITAL_COMMUNITY)
Admit: 2019-01-03 | Discharge: 2019-01-03 | Disposition: A | Discharge status: Home / Self Care | Payer: Medicaid Other | Attending: Physician Assistant | Admitting: Physician Assistant

## 2019-01-03 ENCOUNTER — Encounter (HOSPITAL_COMMUNITY): Payer: Self-pay

## 2019-01-03 ENCOUNTER — Inpatient Hospital Stay (HOSPITAL_COMMUNITY): Payer: Medicaid Other | Admitting: Certified Registered Nurse Anesthetist

## 2019-01-03 ENCOUNTER — Encounter (HOSPITAL_COMMUNITY)
Admission: EM | Disposition: A | Discharge status: Home Health | Payer: Self-pay | Source: Home / Self Care | Attending: Surgery

## 2019-01-03 DIAGNOSIS — R7881 Bacteremia: Secondary | ICD-10-CM

## 2019-01-03 HISTORY — PX: TEE WITHOUT CARDIOVERSION: SHX5443

## 2019-01-03 LAB — GLUCOSE, CAPILLARY
Glucose-Capillary: 104 mg/dL — ABNORMAL HIGH (ref 70–99)
Glucose-Capillary: 115 mg/dL — ABNORMAL HIGH (ref 70–99)
Glucose-Capillary: 123 mg/dL — ABNORMAL HIGH (ref 70–99)
Glucose-Capillary: 137 mg/dL — ABNORMAL HIGH (ref 70–99)
Glucose-Capillary: 231 mg/dL — ABNORMAL HIGH (ref 70–99)
Glucose-Capillary: 75 mg/dL (ref 70–99)
Glucose-Capillary: 91 mg/dL (ref 70–99)

## 2019-01-03 SURGERY — ECHOCARDIOGRAM, TRANSESOPHAGEAL
Anesthesia: Monitor Anesthesia Care

## 2019-01-03 MED ORDER — PROPOFOL 10 MG/ML IV BOLUS
INTRAVENOUS | Status: DC | PRN
  Administered 2019-01-03: 20 mg via INTRAVENOUS

## 2019-01-03 MED ORDER — SODIUM CHLORIDE 0.9 % IV SOLN
INTRAVENOUS | Status: DC
  Administered 2019-01-03: 11:00:00 via INTRAVENOUS

## 2019-01-03 MED ORDER — PHENYLEPHRINE HCL 10 MG/ML IJ SOLN
INTRAMUSCULAR | Status: DC | PRN
  Administered 2019-01-03 (×2): 120 ug via INTRAVENOUS

## 2019-01-03 MED ORDER — EPHEDRINE SULFATE 50 MG/ML IJ SOLN
INTRAMUSCULAR | Status: DC | PRN
  Administered 2019-01-03: 5 mg via INTRAVENOUS

## 2019-01-03 MED ORDER — PROPOFOL 500 MG/50ML IV EMUL
INTRAVENOUS | Status: DC | PRN
  Administered 2019-01-03: 150 ug/kg/min via INTRAVENOUS

## 2019-01-03 MED ORDER — ONDANSETRON HCL 4 MG/2ML IJ SOLN
INTRAMUSCULAR | Status: DC | PRN
  Administered 2019-01-03: 4 mg via INTRAVENOUS

## 2019-01-03 NOTE — Transfer of Care (Signed)
Immediate Anesthesia Transfer of Care Note  Patient: Chase Reed  Procedure(s) Performed: TRANSESOPHAGEAL ECHOCARDIOGRAM (TEE) (N/A )  Patient Location: PACU and Endoscopy Unit  Anesthesia Type:MAC  Level of Consciousness: awake and drowsy  Airway & Oxygen Therapy: Patient Spontanous Breathing  Post-op Assessment: Report given to RN and Post -op Vital signs reviewed and stable  Post vital signs: Reviewed and stable  Last Vitals:  Vitals Value Taken Time  BP    Temp    Pulse    Resp    SpO2      Last Pain:  Vitals:   01/03/19 1047  TempSrc: Oral  PainSc: 5       Patients Stated Pain Goal: 0 (81/01/75 1025)  Complications: No apparent anesthesia complications

## 2019-01-03 NOTE — Progress Notes (Addendum)
[address]       [place] [address]             [phone]      7 Days Post-Op Procedure(s) (LRB): DEBRIDEMENT OF CHEST WALL WOUND (N/A) WOUND VAC CHANGE (N/A) Subjective: Feels pretty well, some pain  Objective: Vital signs in last 24 hours: Temp:  [97.6 F (36.4 C)-98.1 F (36.7 C)] 97.7 F (36.5 C) (03/02 0442) Pulse Rate:  [73-90] 73 (03/02 0442) Cardiac Rhythm: Normal sinus rhythm (03/01 2100) Resp:  [10-14] 10 (03/02 0442) BP: (87-108)/(60-73) 106/71 (03/02 0442) SpO2:  [98 %-100 %] 99 % (03/02 0442) Weight:  [76 kg] 76 kg (03/02 0442)  Hemodynamic parameters for last 24 hours:    Intake/Output from previous day: 03/01 0701 - 03/02 0700 In: 322 [P.O.:322] Out: 1750 [Urine:1750] Intake/Output this shift: No intake/output data recorded.  General appearance: alert, cooperative and no distress Heart: regular rate and rhythm Lungs: clear to auscultation bilaterally Abdomen: benign Extremities: no edema or calf tenderness Wound: vac in place- no surrounding erethema  Lab Results: No results for input(s): WBC, HGB, HCT, PLT in the last 72 hours. BMET: No results for input(s): NA, K, CL, CO2, GLUCOSE, BUN, CREATININE, CALCIUM in the last 72 hours.  PT/INR: No results for input(s): LABPROT, INR in the last 72 hours. ABG    Component Value Date/Time   PHART 7.509 (H) 12/24/2018 0311   HCO3 24.7 12/24/2018 0311   O2SAT 94.5 12/24/2018 0311   CBG (last 3)  Recent Labs    01/02/19 1945 01/03/19 0036 01/03/19 0441  GLUCAP 214* 137* 104*    Meds Scheduled Meds: . aspirin EC  81 mg Oral Daily  . atorvastatin  10 mg Oral q1800  . DULoxetine  60 mg Oral Daily  . gabapentin  600 mg Oral TID  . Influenza vac split quadrivalent PF  0.5 mL Intramuscular Tomorrow-1000  . insulin aspart  0-15 Units Subcutaneous Q4H  . insulin glargine  15 Units Subcutaneous Daily  . lisinopril  10 mg Oral Daily  . metFORMIN  1,000 mg Oral BID WC  .  pneumococcal 23 valent vaccine  0.5 mL Intramuscular Tomorrow-1000  . senna-docusate  1 tablet Oral QHS  . sodium chloride flush  10-40 mL Intracatheter Q12H   Continuous Infusions: .  ceFAZolin (ANCEF) IV 2 g (01/03/19 0607)   PRN Meds:.fentaNYL (SUBLIMAZE) injection, lactulose, oxyCODONE, sodium chloride flush, traMADol, traZODone  Xrays No results found.  Recent Results (from the past 240 hour(s))  Aerobic/Anaerobic Culture (surgical/deep wound)     Status: None   Collection Time: 12/24/18  4:10 PM  Result Value Ref Range Status   Specimen Description ABSCESS  Final   Special Requests CHEST WALL  Final   Gram Stain   Final    ABUNDANT WBC PRESENT, PREDOMINANTLY PMN ABUNDANT GRAM POSITIVE COCCI    Culture   Final    ABUNDANT STAPHYLOCOCCUS AUREUS NO ANAEROBES ISOLATED Performed at [facility], [address], [place], [place] [address]    Report Status 12/29/2018 FINAL  Final   Organism ID, Bacteria STAPHYLOCOCCUS AUREUS  Final      Susceptibility   Staphylococcus aureus - MIC*    CIPROFLOXACIN <=0.5 SENSITIVE Sensitive     ERYTHROMYCIN <=0.25 SENSITIVE Sensitive     GENTAMICIN <=0.5 SENSITIVE Sensitive     OXACILLIN 0.5 SENSITIVE Sensitive     TETRACYCLINE <=1 SENSITIVE Sensitive     VANCOMYCIN 1 SENSITIVE Sensitive  TRIMETH/SULFA <=10 SENSITIVE Sensitive     CLINDAMYCIN <=0.25 SENSITIVE Sensitive     RIFAMPIN <=0.5 SENSITIVE Sensitive     Inducible Clindamycin NEGATIVE Sensitive     * ABUNDANT STAPHYLOCOCCUS AUREUS  Culture, blood (Routine X 2) w Reflex to ID Panel     Status: None   Collection Time: 12/25/18  1:00 PM  Result Value Ref Range Status   Specimen Description BLOOD LEFT HAND  Final   Special Requests   Final    BOTTLES DRAWN AEROBIC ONLY Blood Culture results may not be optimal due to an excessive volume of blood received in culture bottles   Culture   Final    NO GROWTH 5 DAYS Performed at [facility], [address],  [place], [place] [address]    Report Status 12/30/2018 FINAL  Final   Assessment/Plan: S/P Procedure(s) (LRB): DEBRIDEMENT OF CHEST WALL WOUND (N/A) WOUND VAC CHANGE (N/A)  1 afebrile, VSS- BP runs on low side- will stop lisinopril for now 2 conts current IVabx as outlined, VAC wound care- for dressing change today 3 TEE today 4 Increase activity as able 5 BS control is mostly good on current RX- restart Januvia at discharge  LOS: 10 days    Rowe Clack [facility] 01/03/2019 Pager [phone]   Chart reviewed, patient examined, agree with above. VAC changed today. He seems to be tolerating it fairly well. TEE today showed no signs of endocarditis. ID will decide on length of antibiotic course.

## 2019-01-03 NOTE — Anesthesia Preprocedure Evaluation (Addendum)
Anesthesia Evaluation  Patient identified by MRN, date of birth, ID band Patient awake    Reviewed: Allergy & Precautions, NPO status , Patient's Chart, lab work & pertinent test results  History of Anesthesia Complications Negative for: history of anesthetic complications  Airway Mallampati: II  TM Distance: >3 FB Neck ROM: Full    Dental  (+) Dental Advisory Given, Missing, Poor Dentition,    Pulmonary Current Smoker,    Pulmonary exam normal breath sounds clear to auscultation       Cardiovascular negative cardio ROS Normal cardiovascular exam Rhythm:Regular Rate:Normal  TTE 12/24/18: EF >65%, trivial pericardial effusion     Neuro/Psych Schizophrenia negative neurological ROS     GI/Hepatic negative GI ROS, (+) Cirrhosis     substance abuse  IV drug use, Hepatitis -, C  Endo/Other  diabetes, Type 2, Insulin Dependent  Renal/GU negative Renal ROS     Musculoskeletal negative musculoskeletal ROS (+) narcotic dependent  Abdominal   Peds  Hematology  (+) anemia , Hgb 10.5, plt 95   Anesthesia Other Findings Day of surgery medications reviewed with the patient.  MSSA bacteremia  Reproductive/Obstetrics                            Anesthesia Physical Anesthesia Plan  ASA: III  Anesthesia Plan: MAC   Post-op Pain Management:    Induction:   PONV Risk Score and Plan: Treatment may vary due to age or medical condition and Propofol infusion  Airway Management Planned: Natural Airway and Nasal Cannula  Additional Equipment:   Intra-op Plan:   Post-operative Plan:   Informed Consent: I have reviewed the patients History and Physical, chart, labs and discussed the procedure including the risks, benefits and alternatives for the proposed anesthesia with the patient or authorized representative who has indicated his/her understanding and acceptance.     Dental advisory  given  Plan Discussed with: CRNA  Anesthesia Plan Comments:        Anesthesia Quick Evaluation

## 2019-01-03 NOTE — Op Note (Signed)
INDICATIONS: infective endocarditis  PROCEDURE:   Informed consent was obtained prior to the procedure. The risks, benefits and alternatives for the procedure were discussed and the patient comprehended these risks.  Risks include, but are not limited to, cough, sore throat, vomiting, nausea, somnolence, esophageal and stomach trauma or perforation, bleeding, low blood pressure, aspiration, pneumonia, infection, trauma to the teeth and death.    After a procedural time-out, the oropharynx was anesthetized with 20% benzocaine spray.   During this procedure the patient was administered IV propofol by Anesthesiology (Dr. Stephannie Peters).  The transesophageal probe was inserted in the esophagus and stomach without difficulty and multiple views were obtained.  The patient was kept under observation until the patient left the procedure room.  The patient left the procedure room in stable condition.   Agitated microbubble saline contrast was not administered.  COMPLICATIONS:    There were no immediate complications.  FINDINGS:  Normal TEE study. No vegetations/evidence of endocarditis.    Time Spent Directly with the Patient:  30 minutes   Chase Reed 01/03/2019, 12:01 PM

## 2019-01-03 NOTE — Interval H&P Note (Signed)
History and Physical Interval Note:  01/03/2019 10:02 AM  Chase Reed  has presented today for surgery, with the diagnosis of BACTEREMIA.IV DRUG USE  The various methods of treatment have been discussed with the patient and family. After consideration of risks, benefits and other options for treatment, the patient has consented to  Procedure(s): TRANSESOPHAGEAL ECHOCARDIOGRAM (TEE) (N/A) as a surgical intervention .  The patient's history has been reviewed, patient examined, no change in status, stable for surgery.  I have reviewed the patient's chart and labs.  Questions were answered to the patient's satisfaction.     [name]

## 2019-01-03 NOTE — Progress Notes (Signed)
Spoke with endoscopy. Will hold AM aspirin, cymbalta, and gabapentin until after TEE.  Leonidas Romberg, RN

## 2019-01-03 NOTE — Progress Notes (Signed)
Pt returned from endo. Diet ordered. Pt A&O x4. Complaints of incisional pain around wound vac. Given AM meds and PRN oxycodone - see MAR.  Leonidas Romberg, RN

## 2019-01-03 NOTE — Anesthesia Postprocedure Evaluation (Signed)
Anesthesia Post Note  Patient: Chase Reed  Procedure(s) Performed: TRANSESOPHAGEAL ECHOCARDIOGRAM (TEE) (N/A )     Patient location during evaluation: PACU Anesthesia Type: MAC Level of consciousness: awake and alert Pain management: pain level controlled Vital Signs Assessment: post-procedure vital signs reviewed and stable Respiratory status: spontaneous breathing, nonlabored ventilation and respiratory function stable Cardiovascular status: blood pressure returned to baseline and stable Postop Assessment: no apparent nausea or vomiting Anesthetic complications: no    Last Vitals:  Vitals:   01/03/19 1047 01/03/19 1203  BP: (!) 97/51 (!) 87/53  Pulse: 64 69  Resp: 10 15  Temp: 36.6 C 36.6 C  SpO2: 98% 97%    Last Pain:  Vitals:   01/03/19 1203  TempSrc: Oral  PainSc: [place]

## 2019-01-03 NOTE — Consult Note (Signed)
WOC Nurse wound follow up Wound type: Chest wall debridement site. NPWT VAC dressing change.  Patient is premedicated prior to procedure.  Canister is changed this AM as well.  Measurement: 2.2 cm x 6.2 cm x 3 cm, undermining at breastplate.  Area is gently filled with black foam.  1 piece black foam is used.  Wound bed:25 % white tissue or bone/slough  75% pink moist and nongranulating.  Drainage (amount, consistency, odor) Moderate serosanguinous present and canister is changed with present.  Periwound:intact Dressing procedure/placement/frequency:1 piece black foam, packed into underminig that is likely anatomy and not tissue defect   Change M/W/F WOC team will follow.   Maple Hudson MSN, RN, FNP-BC CWON Wound, Ostomy, Continence Nurse Pager [phone]

## 2019-01-04 DIAGNOSIS — B1920 Unspecified viral hepatitis C without hepatic coma: Secondary | ICD-10-CM

## 2019-01-04 LAB — GLUCOSE, CAPILLARY
Glucose-Capillary: 123 mg/dL — ABNORMAL HIGH (ref 70–99)
Glucose-Capillary: 160 mg/dL — ABNORMAL HIGH (ref 70–99)
Glucose-Capillary: 164 mg/dL — ABNORMAL HIGH (ref 70–99)
Glucose-Capillary: 171 mg/dL — ABNORMAL HIGH (ref 70–99)
Glucose-Capillary: 203 mg/dL — ABNORMAL HIGH (ref 70–99)
Glucose-Capillary: 90 mg/dL (ref 70–99)

## 2019-01-04 NOTE — Progress Notes (Signed)
    [facility]   Reason for visit: Follow up on MSSA Bacteremia  Interval History: TEE without vegetation, no new concerns, no associated diarrhea, rash   Physical Exam: Constitutional:  Vitals:   01/04/19 0427 01/04/19 0756  BP: 111/76 108/69  Pulse: 70 69  Resp: 13 16  Temp: 98.1 F (36.7 C) 97.6 F (36.4 C)  SpO2: 99% 97%   patient appears in NAD Eyes: anicteric HENT: no thrush Respiratory: Normal respiratory effort; CTA B Cardiovascular: RRR GI: soft, nt, nd  Review of Systems: Constitutional: negative for fevers, chills and anorexia Gastrointestinal: negative for nausea and diarrhea Integument/breast: negative for rash  Lab Results  Component Value Date   WBC 2.7 (L) 12/30/2018   HGB 10.5 (L) 12/30/2018   HCT 32.5 (L) 12/30/2018   MCV 91.5 12/30/2018   PLT 95 (L) 12/30/2018    Lab Results  Component Value Date   CREATININE 0.76 12/30/2018   BUN 13 12/30/2018   NA 134 (L) 12/30/2018   K 4.3 12/30/2018   CL 101 12/30/2018   CO2 26 12/30/2018    Lab Results  Component Value Date   ALT 9 12/26/2018   AST 15 12/26/2018   ALKPHOS 63 12/26/2018     Microbiology: Recent Results (from the past 240 hour(s))  Culture, blood (Routine X 2) w Reflex to ID Panel     Status: None   Collection Time: 12/25/18  1:00 PM  Result Value Ref Range Status   Specimen Description BLOOD LEFT HAND  Final   Special Requests   Final    BOTTLES DRAWN AEROBIC ONLY Blood Culture results may not be optimal due to an excessive volume of blood received in culture bottles   Culture   Final    NO GROWTH 5 DAYS Performed at [facility], [address], [place], [place] [address]    Report Status 12/30/2018 FINAL  Final    Impression/Plan:  1. MSSA bacteremia - on cefazolin and will need for 4 weeks through March 20th.    2.  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   3.  Hepatitis C - HCV quant minimally  positive, will recheck.

## 2019-01-04 NOTE — Progress Notes (Addendum)
[address]       [place] [address]             [phone]      1 Day Post-Op Procedure(s) (LRB): TRANSESOPHAGEAL ECHOCARDIOGRAM (TEE) (N/A) Subjective: Some pain c/o  Objective: Vital signs in last 24 hours: Temp:  [97.3 F (36.3 C)-98.1 F (36.7 C)] 97.6 F (36.4 C) (03/03 0756) Pulse Rate:  [51-91] 69 (03/03 0756) Cardiac Rhythm: Normal sinus rhythm (03/02 1935) Resp:  [8-18] 16 (03/03 0756) BP: (87-116)/(51-76) 108/69 (03/03 0756) SpO2:  [96 %-99 %] 97 % (03/03 0756) Weight:  [76 kg] 76 kg (03/03 0427)  Hemodynamic parameters for last 24 hours:    Intake/Output from previous day: 03/02 0701 - 03/03 0700 In: 450 [P.O.:200; I.V.:250] Out: 1425 [Urine:1350; Drains:75] Intake/Output this shift: Total I/O In: -  Out: 450 [Urine:450]  General appearance: alert, cooperative and no distress Heart: regular rate and rhythm, no rub and no murmur Lungs: clear to auscultation bilaterally Abdomen: benign Extremities: no edema or calf tenderness Wound: VAC in place  Lab Results: No results for input(s): WBC, HGB, HCT, PLT in the last 72 hours. BMET: No results for input(s): NA, K, CL, CO2, GLUCOSE, BUN, CREATININE, CALCIUM in the last 72 hours.  PT/INR: No results for input(s): LABPROT, INR in the last 72 hours. ABG    Component Value Date/Time   PHART 7.509 (H) 12/24/2018 0311   HCO3 24.7 12/24/2018 0311   O2SAT 94.5 12/24/2018 0311   CBG (last 3)  Recent Labs    01/04/19 0010 01/04/19 0414 01/04/19 0754  GLUCAP 164* 160* 90   Results for orders placed or performed during the hospital encounter of 12/23/18  Blood Culture (routine x 2)     Status: Abnormal   Collection Time: 12/23/18  8:30 PM  Result Value Ref Range Status   Specimen Description BLOOD RIGHT ARM  Final   Special Requests   Final    BOTTLES DRAWN AEROBIC AND ANAEROBIC Blood Culture results may not be optimal due to an excessive volume of blood received in culture  bottles   Culture  Setup Time   Final    GRAM POSITIVE COCCI IN BOTH AEROBIC AND ANAEROBIC BOTTLES CRITICAL RESULT CALLED TO, READ BACK BY AND VERIFIED WITH: Patrick North PharmD 12:20 12/24/18 ([email or web address])    Culture STAPHYLOCOCCUS AUREUS (A)  Final   Report Status 12/26/2018 FINAL  Final   Organism ID, Bacteria STAPHYLOCOCCUS AUREUS  Final      Susceptibility   Staphylococcus aureus - MIC*    CIPROFLOXACIN <=0.5 SENSITIVE Sensitive     ERYTHROMYCIN <=0.25 SENSITIVE Sensitive     GENTAMICIN <=0.5 SENSITIVE Sensitive     OXACILLIN 0.5 SENSITIVE Sensitive     TETRACYCLINE <=1 SENSITIVE Sensitive     VANCOMYCIN <=0.5 SENSITIVE Sensitive     TRIMETH/SULFA <=10 SENSITIVE Sensitive     CLINDAMYCIN <=0.25 SENSITIVE Sensitive     RIFAMPIN <=0.5 SENSITIVE Sensitive     Inducible Clindamycin NEGATIVE Sensitive     * STAPHYLOCOCCUS AUREUS  Blood Culture ID Panel (Reflexed)     Status: Abnormal   Collection Time: 12/23/18  8:30 PM  Result Value Ref Range Status   Enterococcus species NOT DETECTED NOT DETECTED Final   Listeria monocytogenes NOT DETECTED NOT DETECTED Final   Staphylococcus species DETECTED (A) NOT DETECTED Final    Comment: CRITICAL RESULT CALLED TO, READ BACK BY AND VERIFIED WITH: Patrick North PharmD 12:20 12/24/18 ([email or web address])  Staphylococcus aureus (BCID) DETECTED (A) NOT DETECTED Final    Comment: Methicillin (oxacillin) susceptible Staphylococcus aureus (MSSA). Preferred therapy is anti staphylococcal beta lactam antibiotic (Cefazolin or Nafcillin), unless clinically contraindicated. CRITICAL RESULT CALLED TO, READ BACK BY AND VERIFIED WITH: Patrick North PharmD 12:20 12/24/18 ([email or web address])    Methicillin resistance NOT DETECTED NOT DETECTED Final   Streptococcus species NOT DETECTED NOT DETECTED Final   Streptococcus agalactiae NOT DETECTED NOT DETECTED Final   Streptococcus pneumoniae NOT DETECTED NOT DETECTED Final   Streptococcus pyogenes NOT DETECTED NOT DETECTED Final    Acinetobacter baumannii NOT DETECTED NOT DETECTED Final   Enterobacteriaceae species NOT DETECTED NOT DETECTED Final   Enterobacter cloacae complex NOT DETECTED NOT DETECTED Final   Escherichia coli NOT DETECTED NOT DETECTED Final   Klebsiella oxytoca NOT DETECTED NOT DETECTED Final   Klebsiella pneumoniae NOT DETECTED NOT DETECTED Final   Proteus species NOT DETECTED NOT DETECTED Final   Serratia marcescens NOT DETECTED NOT DETECTED Final   Haemophilus influenzae NOT DETECTED NOT DETECTED Final   Neisseria meningitidis NOT DETECTED NOT DETECTED Final   Pseudomonas aeruginosa NOT DETECTED NOT DETECTED Final   Candida albicans NOT DETECTED NOT DETECTED Final   Candida glabrata NOT DETECTED NOT DETECTED Final   Candida krusei NOT DETECTED NOT DETECTED Final   Candida parapsilosis NOT DETECTED NOT DETECTED Final   Candida tropicalis NOT DETECTED NOT DETECTED Final    Comment: Performed at [facility], [address], [place], [place] [address]  Blood Culture (routine x 2)     Status: Abnormal   Collection Time: 12/23/18  8:47 PM  Result Value Ref Range Status   Specimen Description BLOOD BLOOD RIGHT FOREARM  Final   Special Requests   Final    BOTTLES DRAWN AEROBIC ONLY Blood Culture results may not be optimal due to an inadequate volume of blood received in culture bottles   Culture  Setup Time   Final    GRAM POSITIVE COCCI IN CLUSTERS AEROBIC BOTTLE ONLY CRITICAL VALUE NOTED.  VALUE IS CONSISTENT WITH PREVIOUSLY REPORTED AND CALLED VALUE. Performed at [facility], [address], [place], [place] [address]    Culture (A)  Final    STAPHYLOCOCCUS AUREUS SUSCEPTIBILITIES PERFORMED ON PREVIOUS CULTURE WITHIN THE LAST 5 DAYS.    Report Status 12/26/2018 FINAL  Final  Surgical pcr screen     Status: Abnormal   Collection Time: 12/24/18  2:52 AM  Result Value Ref Range Status   MRSA, PCR POSITIVE (A) NEGATIVE Final    Comment: RESULT CALLED TO, READ BACK BY AND VERIFIED  WITH: C DAVIDSON RN 12/24/18 0449 JDW    Staphylococcus aureus POSITIVE (A) NEGATIVE Final    Comment: (NOTE) The Xpert SA Assay (FDA approved for NASAL specimens in patients 17 years of age and older), is one component of a comprehensive surveillance program. It is not intended to diagnose infection nor to guide or monitor treatment. Performed at [facility], [address] [place], [place] [address]   Aerobic/Anaerobic Culture (surgical/deep wound)     Status: None   Collection Time: 12/24/18  4:10 PM  Result Value Ref Range Status   Specimen Description ABSCESS  Final   Special Requests CHEST WALL  Final   Gram Stain   Final    ABUNDANT WBC PRESENT, PREDOMINANTLY PMN ABUNDANT GRAM POSITIVE COCCI    Culture   Final    ABUNDANT STAPHYLOCOCCUS AUREUS NO ANAEROBES ISOLATED Performed at [facility], [address], [place], [place] [address]    Report Status 12/29/2018 FINAL  Final   Organism ID, Bacteria STAPHYLOCOCCUS AUREUS  Final      Susceptibility   Staphylococcus aureus - MIC*    CIPROFLOXACIN <=0.5 SENSITIVE Sensitive     ERYTHROMYCIN <=0.25 SENSITIVE Sensitive     GENTAMICIN <=0.5 SENSITIVE Sensitive     OXACILLIN 0.5 SENSITIVE Sensitive     TETRACYCLINE <=1 SENSITIVE Sensitive     VANCOMYCIN 1 SENSITIVE Sensitive     TRIMETH/SULFA <=10 SENSITIVE Sensitive     CLINDAMYCIN <=0.25 SENSITIVE Sensitive     RIFAMPIN <=0.5 SENSITIVE Sensitive     Inducible Clindamycin NEGATIVE Sensitive     * ABUNDANT STAPHYLOCOCCUS AUREUS  Culture, blood (Routine X 2) w Reflex to ID Panel     Status: None   Collection Time: 12/25/18  1:00 PM  Result Value Ref Range Status   Specimen Description BLOOD LEFT HAND  Final   Special Requests   Final    BOTTLES DRAWN AEROBIC ONLY Blood Culture results may not be optimal due to an excessive volume of blood received in culture bottles   Culture   Final    NO GROWTH 5 DAYS Performed at [facility], [address]  [place], [place] [address]    Report Status 12/30/2018 FINAL  Final   Meds Scheduled Meds: . aspirin EC  81 mg Oral Daily  . atorvastatin  10 mg Oral q1800  . DULoxetine  60 mg Oral Daily  . gabapentin  600 mg Oral TID  . Influenza vac split quadrivalent PF  0.5 mL Intramuscular Tomorrow-1000  . insulin aspart  0-15 Units Subcutaneous Q4H  . insulin glargine  15 Units Subcutaneous Daily  . metFORMIN  1,000 mg Oral BID WC  . pneumococcal 23 valent vaccine  0.5 mL Intramuscular Tomorrow-1000  . senna-docusate  1 tablet Oral QHS  . sodium chloride flush  10-40 mL Intracatheter Q12H   Continuous Infusions: .  ceFAZolin (ANCEF) IV 2 g (01/04/19 0504)   PRN Meds:.fentaNYL (SUBLIMAZE) injection, lactulose, oxyCODONE, sodium chloride flush, traMADol, traZODone  Xrays No results found.  Assessment/Plan: S/P Procedure(s) (LRB): TRANSESOPHAGEAL ECHOCARDIOGRAM (TEE) (N/A)  1 afebrile, VSS BP well controlled 2 sats good on RA 3 conts VAC care 4 conts IV cefazolin, ID to determine length of course    LOS: 11 days    Rowe Clack [facility] 01/04/2019 Pager [phone]   Chart reviewed, patient examined, agree with above. ID recommends antibiotics through 3/20.

## 2019-01-05 LAB — GLUCOSE, CAPILLARY
Glucose-Capillary: 108 mg/dL — ABNORMAL HIGH (ref 70–99)
Glucose-Capillary: 137 mg/dL — ABNORMAL HIGH (ref 70–99)
Glucose-Capillary: 139 mg/dL — ABNORMAL HIGH (ref 70–99)
Glucose-Capillary: 139 mg/dL — ABNORMAL HIGH (ref 70–99)
Glucose-Capillary: 152 mg/dL — ABNORMAL HIGH (ref 70–99)
Glucose-Capillary: 179 mg/dL — ABNORMAL HIGH (ref 70–99)
Glucose-Capillary: 90 mg/dL (ref 70–99)

## 2019-01-05 MED ORDER — INSULIN GLARGINE 100 UNIT/ML ~~LOC~~ SOLN
20.0000 [IU] | Freq: Every day | SUBCUTANEOUS | Status: DC
  Administered 2019-01-05: 20 [IU] via SUBCUTANEOUS
  Filled 2019-01-05 (×2): qty 0.2

## 2019-01-05 NOTE — Consult Note (Signed)
WOC Nurse wound follow up Wound type: Chest wall debridement site.  NPWT dressing change.  Wound [id number] Drainage (amount, consistency, odor) minimal serosanguinous  No odor Periwound: erythema.  Will add barrier ring  Dressing procedure/placement/frequency: 1 piece black foam and seal obtained immediately  [facility]ange M/W/F WOC team will follow  Maple Hudson MSN, RN, FNP-BC CWON Wound, Ostomy, Continence Nurse Pager [phone]

## 2019-01-05 NOTE — Progress Notes (Addendum)
      [address]       [place] [address]             [phone]      2 Days Post-Op Procedure(s) (LRB): TRANSESOPHAGEAL ECHOCARDIOGRAM (TEE) (N/A) Subjective: Feels ok, main c/o is feet pain from diabetic neuropathy  Objective: Vital signs in last 24 hours: Temp:  [97.4 F (36.3 C)-98.3 F (36.8 C)] 98 F (36.7 C) (03/04 0325) Pulse Rate:  [69-79] 78 (03/04 0325) Cardiac Rhythm: Normal sinus rhythm (03/04 0400) Resp:  [13-20] 13 (03/04 0325) BP: (105-114)/(69-78) 111/78 (03/04 0325) SpO2:  [96 %-100 %] 97 % (03/04 0325)  Hemodynamic parameters for last 24 hours:    Intake/Output from previous day: 03/03 0701 - 03/04 0700 In: 1120 [P.O.:1020; IV Piggyback:100] Out: 1900 [Urine:1900] Intake/Output this shift: No intake/output data recorded.  General appearance: alert, cooperative and no distress Heart: regular rate and rhythm Lungs: clear to auscultation bilaterally Abdomen: benign Extremities: no edema or calf pain, + foot/ankle pain to touch Wound: vac in place  Lab Results: No results for input(s): WBC, HGB, HCT, PLT in the last 72 hours. BMET: No results for input(s): NA, K, CL, CO2, GLUCOSE, BUN, CREATININE, CALCIUM in the last 72 hours.  PT/INR: No results for input(s): LABPROT, INR in the last 72 hours. ABG    Component Value Date/Time   PHART 7.509 (H) 12/24/2018 0311   HCO3 24.7 12/24/2018 0311   O2SAT 94.5 12/24/2018 0311   CBG (last 3)  Recent Labs    01/04/19 2042 01/04/19 2350 01/05/19 0451  GLUCAP 203* 139* 139*    Meds Scheduled Meds: . aspirin EC  81 mg Oral Daily  . atorvastatin  10 mg Oral q1800  . DULoxetine  60 mg Oral Daily  . gabapentin  600 mg Oral TID  . Influenza vac split quadrivalent PF  0.5 mL Intramuscular Tomorrow-1000  . insulin aspart  0-15 Units Subcutaneous Q4H  . insulin glargine  15 Units Subcutaneous Daily  . metFORMIN  1,000 mg Oral BID WC  . pneumococcal 23 valent vaccine  0.5 mL  Intramuscular Tomorrow-1000  . senna-docusate  1 tablet Oral QHS  . sodium chloride flush  10-40 mL Intracatheter Q12H   Continuous Infusions: .  ceFAZolin (ANCEF) IV 2 g (01/05/19 0630)   PRN Meds:.fentaNYL (SUBLIMAZE) injection, lactulose, oxyCODONE, sodium chloride flush, traMADol, traZODone  Xrays No results found.  Assessment/Plan: S/P Procedure(s) (LRB): TRANSESOPHAGEAL ECHOCARDIOGRAM (TEE) (N/A)  1 doing well overall, hemodyn stable in sinus rhythm 2 conts VAC changes at current schedule 3 IV cefazolin till 3/20 per ID 4 conts gabapentin for neuropathy 5 BS control variable but reasonable- wasn't taking tradjenta or Januvia at home, will increase insulin a little   LOS: 12 days    Rowe Clack [facility] 01/05/2019 pager [phone]   Chart reviewed, patient examined, agree with above. VAC working well. Plan change today. No erythema of chest wall and tenderness of chest wall seems to be improving. He wants to get in the shower which is fine.

## 2019-01-06 LAB — GLUCOSE, CAPILLARY
Glucose-Capillary: 133 mg/dL — ABNORMAL HIGH (ref 70–99)
Glucose-Capillary: 153 mg/dL — ABNORMAL HIGH (ref 70–99)
Glucose-Capillary: 91 mg/dL (ref 70–99)

## 2019-01-06 MED ORDER — INSULIN ASPART 100 UNIT/ML ~~LOC~~ SOLN
0.0000 [IU] | Freq: Three times a day (TID) | SUBCUTANEOUS | Status: DC
  Administered 2019-01-06 – 2019-01-07 (×2): 3 [IU] via SUBCUTANEOUS
  Administered 2019-01-07 – 2019-01-08 (×2): 2 [IU] via SUBCUTANEOUS
  Administered 2019-01-08 – 2019-01-09 (×2): 3 [IU] via SUBCUTANEOUS
  Administered 2019-01-09: 2 [IU] via SUBCUTANEOUS
  Administered 2019-01-09 – 2019-01-12 (×7): 3 [IU] via SUBCUTANEOUS
  Administered 2019-01-12: 5 [IU] via SUBCUTANEOUS
  Administered 2019-01-13: 9 [IU] via SUBCUTANEOUS
  Administered 2019-01-14 – 2019-01-16 (×2): 3 [IU] via SUBCUTANEOUS
  Administered 2019-01-17: 2 [IU] via SUBCUTANEOUS
  Administered 2019-01-18 – 2019-01-19 (×3): 3 [IU] via SUBCUTANEOUS
  Administered 2019-01-20: 5 [IU] via SUBCUTANEOUS
  Administered 2019-01-21: 3 [IU] via SUBCUTANEOUS
  Administered 2019-01-21 (×2): 2 [IU] via SUBCUTANEOUS
  Administered 2019-01-22: 3 [IU] via SUBCUTANEOUS
  Administered 2019-01-22: 2 [IU] via SUBCUTANEOUS
  Administered 2019-01-23 – 2019-01-25 (×3): 3 [IU] via SUBCUTANEOUS

## 2019-01-06 MED ORDER — LINAGLIPTIN 5 MG PO TABS
5.0000 mg | ORAL_TABLET | Freq: Every day | ORAL | Status: DC
  Administered 2019-01-06 – 2019-01-25 (×19): 5 mg via ORAL
  Filled 2019-01-06 (×19): qty 1

## 2019-01-06 MED ORDER — INSULIN GLARGINE 100 UNIT/ML ~~LOC~~ SOLN
15.0000 [IU] | Freq: Every day | SUBCUTANEOUS | Status: DC
  Administered 2019-01-06 – 2019-01-25 (×19): 15 [IU] via SUBCUTANEOUS
  Filled 2019-01-06 (×21): qty 0.15

## 2019-01-06 NOTE — Progress Notes (Cosign Needed)
      [address]       [place] [address]             [phone]      3 Days Post-Op Procedure(s) (LRB): TRANSESOPHAGEAL ECHOCARDIOGRAM (TEE) (N/A) Subjective: Feels well Wants to discontinue lipitor because he has had problems in past Has tinea cruris- can use his clortrimazole Wants regular diet, manages diabetes pretty well with HG A1C usually around 6(per patient), most recent in epic 7.5  Objective: Vital signs in last 24 hours: Temp:  [97.8 F (36.6 C)-98.2 F (36.8 C)] 98.2 F (36.8 C) (03/05 0459) Pulse Rate:  [73-84] 79 (03/05 0459) Cardiac Rhythm: Normal sinus rhythm (03/04 1921) Resp:  [10-20] 10 (03/05 0459) BP: (100-111)/(71-72) 100/71 (03/05 0459) SpO2:  [95 %-100 %] 97 % (03/05 0459)  Hemodynamic parameters for last 24 hours:    Intake/Output from previous day: 03/04 0701 - 03/05 0700 In: 883 [P.O.:683; IV Piggyback:200] Out: 1000 [Urine:1000] Intake/Output this shift: No intake/output data recorded.  General appearance: alert, cooperative and no distress Heart: regular rate and rhythm Lungs: clear to auscultation bilaterally Abdomen: benign Extremities: no edema or calf tenderness Wound: VAC in place- no erethema or obvious purulence  Lab Results: No results for input(s): WBC, HGB, HCT, PLT in the last 72 hours. BMET: No results for input(s): NA, K, CL, CO2, GLUCOSE, BUN, CREATININE, CALCIUM in the last 72 hours.  PT/INR: No results for input(s): LABPROT, INR in the last 72 hours. ABG    Component Value Date/Time   PHART 7.509 (H) 12/24/2018 0311   HCO3 24.7 12/24/2018 0311   O2SAT 94.5 12/24/2018 0311   CBG (last 3)  Recent Labs    01/05/19 2011 01/05/19 2344 01/06/19 0458  GLUCAP 90 137* 133*    Meds Scheduled Meds: . aspirin EC  81 mg Oral Daily  . atorvastatin  10 mg Oral q1800  . DULoxetine  60 mg Oral Daily  . gabapentin  600 mg Oral TID  . Influenza vac split quadrivalent PF  0.5 mL Intramuscular  Tomorrow-1000  . insulin aspart  0-15 Units Subcutaneous Q4H  . insulin glargine  20 Units Subcutaneous Daily  . metFORMIN  1,000 mg Oral BID WC  . pneumococcal 23 valent vaccine  0.5 mL Intramuscular Tomorrow-1000  . senna-docusate  1 tablet Oral QHS  . sodium chloride flush  10-40 mL Intracatheter Q12H   Continuous Infusions: .  ceFAZolin (ANCEF) IV 2 g (01/06/19 0539)   PRN Meds:.fentaNYL (SUBLIMAZE) injection, lactulose, oxyCODONE, sodium chloride flush, traMADol, traZODone  Xrays No results found.  Assessment/Plan: S/P Procedure(s) (LRB): TRANSESOPHAGEAL ECHOCARDIOGRAM (TEE) (N/A)  1 doing well on current RX/TX 2 will d/c lipitor per patient request- has problems with muscle pain and worsening neuropathy 3 Change to reg diet-emphasized importance of low carbs/sugars but he feels he doesn't get enough here He says he does take Januvia at home- will add and lower insulin dose 4 cont IV cefazolin and VAC changes as scheduled    LOS: 13 days    Rowe Clack [facility] 01/06/2019 Pager [phone]

## 2019-01-07 LAB — GLUCOSE, CAPILLARY
Glucose-Capillary: 173 mg/dL — ABNORMAL HIGH (ref 70–99)
Glucose-Capillary: 93 mg/dL (ref 70–99)
Glucose-Capillary: 138 mg/dL — ABNORMAL HIGH (ref 70–99)
Glucose-Capillary: 110 mg/dL — ABNORMAL HIGH (ref 70–99)

## 2019-01-07 NOTE — Consult Note (Signed)
WOC Nurse wound follow up Wound type: Chest wall debridement site.  NPWT (VAC) dressing change.  Protected periwound with barrier ring.  Measurement: 2 cm x 6 cm x 1.2 cm at breastplate Wound bed:20% white tissue/bone 80% beefy red Drainage (amount, consistency, odor) minimal serosanguinous  No odor Periwound: protected with barrier ring  Dressing procedure/placement/frequency: 1 piece black foam Seal immediately achieved.  WOC team will follow.  Maple Hudson MSN, RN, FNP-BC CWON Wound, Ostomy, Continence Nurse Pager [phone]

## 2019-01-07 NOTE — Progress Notes (Signed)
This am, the wound vac started to alarm about a blockage alert and then it would continue suctioning. It did this several times until around 4 am when it stayed on warning about a blockage.  The nurse and charge nurse tried disconnecting the tubing, flushing both towards the canister and the patient to see if flow could be restored.  Flushing the tubing did not have any affect on the wound vac and it continued to alarm.  Wound vac has been turned off and tubing is clamped. Will inform on coming nurse of development and continue to monitor.  Harriet Masson, RN

## 2019-01-07 NOTE — Progress Notes (Addendum)
      [address]       [place] [address]             [phone]      4 Days Post-Op Procedure(s) (LRB): TRANSESOPHAGEAL ECHOCARDIOGRAM (TEE) (N/A) Subjective: More pain this am from wound and feet  Objective: Vital signs in last 24 hours: Temp:  [97.3 F (36.3 C)-98.5 F (36.9 C)] 97.7 F (36.5 C) (03/06 0309) Pulse Rate:  [72-89] 72 (03/06 0309) Cardiac Rhythm: Normal sinus rhythm (03/05 1902) Resp:  [8-19] 8 (03/06 0309) BP: (104-112)/(65-74) 104/74 (03/06 0309) SpO2:  [95 %-97 %] 95 % (03/06 0309)  Hemodynamic parameters for last 24 hours:    Intake/Output from previous day: 03/05 0701 - 03/06 0700 In: 1072 [P.O.:772; IV Piggyback:300] Out: 1900 [Urine:1900] Intake/Output this shift: No intake/output data recorded.  General appearance: alert, cooperative and no distress Heart: regular rate and rhythm Lungs: clear to auscultation bilaterally Abdomen: benign Extremities: no edema, + DP pulses Wound: VAC ok- for dressing change this am  Lab Results: No results for input(s): WBC, HGB, HCT, PLT in the last 72 hours. BMET: No results for input(s): NA, K, CL, CO2, GLUCOSE, BUN, CREATININE, CALCIUM in the last 72 hours.  PT/INR: No results for input(s): LABPROT, INR in the last 72 hours. ABG    Component Value Date/Time   PHART 7.509 (H) 12/24/2018 0311   HCO3 24.7 12/24/2018 0311   O2SAT 94.5 12/24/2018 0311   CBG (last 3)  Recent Labs    01/06/19 1105 01/06/19 2048 01/07/19 0631  GLUCAP 153* 91 173*    Meds Scheduled Meds: . aspirin EC  81 mg Oral Daily  . DULoxetine  60 mg Oral Daily  . gabapentin  600 mg Oral TID  . Influenza vac split quadrivalent PF  0.5 mL Intramuscular Tomorrow-1000  . insulin aspart  0-15 Units Subcutaneous TID AC & HS  . insulin glargine  15 Units Subcutaneous Daily  . linagliptin  5 mg Oral Daily  . metFORMIN  1,000 mg Oral BID WC  . pneumococcal 23 valent vaccine  0.5 mL Intramuscular Tomorrow-1000    . senna-docusate  1 tablet Oral QHS  . sodium chloride flush  10-40 mL Intracatheter Q12H   Continuous Infusions: .  ceFAZolin (ANCEF) IV 2 g (01/07/19 0556)   PRN Meds:.fentaNYL (SUBLIMAZE) injection, lactulose, oxyCODONE, sodium chloride flush, traMADol, traZODone  Xrays No results found.  Assessment/Plan: S/P Procedure(s) (LRB): TRANSESOPHAGEAL ECHOCARDIOGRAM (TEE) (N/A)  1 stable overal on current RX/TX, conts same   LOS: 14 days    Rowe Clack [facility] 01/07/2019 Pager [phone]   Chart reviewed, patient examined, agree with above. He had VAC change today and says he has had much less pain with changes. He does report more chest wall pain today. There is no erythema. Remains afebrile. This pain may be due to the infection which diffused out into the pectoralis muscle and burrowed through the intercostal space into the mediastinum. I will plan to do another chest CT at some point but as long as he remains afebrile with no new physical findings I would do it just before completion of the antibiotic course.

## 2019-01-08 LAB — GLUCOSE, CAPILLARY
Glucose-Capillary: 130 mg/dL — ABNORMAL HIGH (ref 70–99)
Glucose-Capillary: 144 mg/dL — ABNORMAL HIGH (ref 70–99)
Glucose-Capillary: 97 mg/dL (ref 70–99)
Glucose-Capillary: 175 mg/dL — ABNORMAL HIGH (ref 70–99)

## 2019-01-08 NOTE — Progress Notes (Addendum)
5 Days Post-Op Procedure(s) (LRB): TRANSESOPHAGEAL ECHOCARDIOGRAM (TEE) (N/A) Subjective: Resting quietly in bed.  He awakened easily.  He had no new complaints or issues to report.  He continues to have some soreness around the anterior chest wall wound as expected.  He remains afebrile  Objective: Vital signs in last 24 hours: Temp:  [98 F (36.7 C)-98.2 F (36.8 C)] 98.2 F (36.8 C) (03/07 1218) Pulse Rate:  [65-84] 65 (03/07 1218) Cardiac Rhythm: Normal sinus rhythm (03/07 1218) Resp:  [8-18] 13 (03/07 1218) BP: (94-114)/(72-86) 114/79 (03/07 1218) SpO2:  [92 %-98 %] 92 % (03/07 1218)    Intake/Output from previous day: 03/06 0701 - 03/07 0700 In: 730 [P.O.:720; I.V.:10] Out: 1910 [Urine:1900; Drains:10] Intake/Output this shift: Total I/O In: 360 [P.O.:360] Out: 860 [Urine:850; Drains:10]  General appearance: alert, cooperative and mild distress Heart: regular rate and rhythm Wound: Going back was changed yesterday.  It is functioning appropriately with minimal drainage.  There is minimal surrounding erythema.  Lab Results: No results for input(s): WBC, HGB, HCT, PLT in the last 72 hours. BMET: No results for input(s): NA, K, CL, CO2, GLUCOSE, BUN, CREATININE, CALCIUM in the last 72 hours.  PT/INR: No results for input(s): LABPROT, INR in the last 72 hours. ABG    Component Value Date/Time   PHART 7.509 (H) 12/24/2018 0311   HCO3 24.7 12/24/2018 0311   O2SAT 94.5 12/24/2018 0311   CBG (last 3)  Recent Labs    01/07/19 2056 01/08/19 0629 01/08/19 1130  GLUCAP 110* 130* 144*    Assessment/Plan: S/P Procedure(s) (LRB): TRANSESOPHAGEAL ECHOCARDIOGRAM (TEE) (N/A)  Postop day 15 debridement of chest wall abscess and placement of a wound VAC with subsequent debridement in the OR 3 days later.  We will continue wound VAC changes 3 times weekly.  Continue IV cefazolin for methicillin sensitive Staphylococcus aureus. Continue SCDs for DVT prophylaxis  LOS: 15 days     Leary Roca, PA-C [phone] 01/08/2019  I have seen and examined the patient and agree with the assessment and plan as outlined.  Purcell Nails, MD 01/08/2019 6:02 PM

## 2019-01-09 LAB — HEPATITIS C GENOTYPE

## 2019-01-09 LAB — GLUCOSE, CAPILLARY
Glucose-Capillary: 111 mg/dL — ABNORMAL HIGH (ref 70–99)
Glucose-Capillary: 132 mg/dL — ABNORMAL HIGH (ref 70–99)
Glucose-Capillary: 154 mg/dL — ABNORMAL HIGH (ref 70–99)
Glucose-Capillary: 155 mg/dL — ABNORMAL HIGH (ref 70–99)
Glucose-Capillary: 182 mg/dL — ABNORMAL HIGH (ref 70–99)
Glucose-Capillary: 92 mg/dL (ref 70–99)

## 2019-01-09 LAB — HCV RNA QUANT RFLX ULTRA OR GENOTYP
HCV RNA Qnt(log copy/mL): 6.692 log10 IU/mL
HepC Qn: 4920000 IU/mL

## 2019-01-09 MED ORDER — ONDANSETRON HCL 4 MG/2ML IJ SOLN
4.0000 mg | Freq: Four times a day (QID) | INTRAMUSCULAR | Status: DC | PRN
  Administered 2019-01-09: 4 mg via INTRAVENOUS
  Filled 2019-01-09: qty 2

## 2019-01-09 NOTE — Progress Notes (Addendum)
6 Days Post-Op Procedure(s) (LRB): TRANSESOPHAGEAL ECHOCARDIOGRAM (TEE) (N/A) Subjective: Resting in bed. No new issues.  Objective: Vital signs in last 24 hours: Temp:  [97.8 F (36.6 C)-98.5 F (36.9 C)] 98.3 F (36.8 C) (03/08 1222) Pulse Rate:  [71-94] 76 (03/08 1222) Cardiac Rhythm: Normal sinus rhythm (03/08 1222) Resp:  [10-29] 13 (03/08 1230) BP: (101-128)/(66-94) 105/72 (03/08 1222) SpO2:  [96 %-98 %] 97 % (03/08 1222)  Hemodynamic parameters for last 24 hours:    Intake/Output from previous day: 03/07 0701 - 03/08 0700 In: 1480 [P.O.:1280; IV Piggyback:200] Out: 3060 [Urine:3050; Drains:10] Intake/Output this shift: Total I/O In: 480 [P.O.:480] Out: 800 [Urine:800]  General appearance: alert, cooperative and mild distress Heart: regular rate and rhythm Wound: The wound vac was changed 01/08/19.  It is functioning appropriately with minimal drainage.  There is minimal surrounding erythema.  Lab Results: No results for input(s): WBC, HGB, HCT, PLT in the last 72 hours. BMET: No results for input(s): NA, K, CL, CO2, GLUCOSE, BUN, CREATININE, CALCIUM in the last 72 hours.  PT/INR: No results for input(s): LABPROT, INR in the last 72 hours. ABG    Component Value Date/Time   PHART 7.509 (H) 12/24/2018 0311   HCO3 24.7 12/24/2018 0311   O2SAT 94.5 12/24/2018 0311   CBG (last 3)  Recent Labs    01/09/19 0357 01/09/19 0830 01/09/19 1231  GLUCAP 155* 154* 132*    Assessment/Plan: S/P Procedure(s) (LRB): TRANSESOPHAGEAL ECHOCARDIOGRAM (TEE) (N/A)   Postop day 16 debridement of chest wall abscess and placement of a wound VAC with subsequent debridement in the OR 3 days later.  We will continue wound VAC changes 3 times weekly.  Continue IV cefazolin for methicillin sensitive Staphylococcus aureus. Continue SCDs for DVT prophylaxis Dr. Laneta Simmers plans to repeat a chest CT near the completion date of the IV antibiotics (March 20).   LOS: 16 days    Leary Roca, PA-C [phone] 01/09/2019  I have seen and examined the patient and agree with the assessment and plan as outlined.  Purcell Nails, MD 01/09/2019 2:01 PM

## 2019-01-10 ENCOUNTER — Inpatient Hospital Stay (HOSPITAL_COMMUNITY): Payer: Medicaid Other

## 2019-01-10 LAB — CREATININE, SERUM
Creatinine, Ser: 0.82 mg/dL (ref 0.61–1.24)
GFR calc Af Amer: >60 mL/min (ref >60)
GFR calc non Af Amer: >60 mL/min (ref >60)

## 2019-01-10 LAB — GLUCOSE, CAPILLARY
Glucose-Capillary: 96 mg/dL (ref 70–99)
Glucose-Capillary: 113 mg/dL — ABNORMAL HIGH (ref 70–99)
Glucose-Capillary: 136 mg/dL — ABNORMAL HIGH (ref 70–99)
Glucose-Capillary: 185 mg/dL — ABNORMAL HIGH (ref 70–99)

## 2019-01-10 MED ORDER — KETOROLAC TROMETHAMINE 15 MG/ML IJ SOLN
15.0000 mg | Freq: Four times a day (QID) | INTRAMUSCULAR | Status: AC | PRN
  Administered 2019-01-10 – 2019-01-15 (×3): 15 mg via INTRAVENOUS
  Filled 2019-01-10 (×2): qty 1

## 2019-01-10 MED ORDER — IOHEXOL 350 MG/ML SOLN
100.0000 mL | Freq: Once | INTRAVENOUS | Status: AC | PRN
  Administered 2019-01-10: 100 mL via INTRAVENOUS

## 2019-01-10 NOTE — Progress Notes (Addendum)
[address]       [place] [address]             [phone]      7 Days Post-Op Procedure(s) (LRB): TRANSESOPHAGEAL ECHOCARDIOGRAM (TEE) (N/A) Subjective: Feels like his pain is getting worse. He does however not always take his medication on time according to the patient.   Objective: Vital signs in last 24 hours: Temp:  [97.9 F (36.6 C)-98.4 F (36.9 C)] 97.9 F (36.6 C) (03/09 0810) Pulse Rate:  [76-86] 76 (03/09 0810) Cardiac Rhythm: Normal sinus rhythm (03/09 0700) Resp:  [13-29] 14 (03/09 0810) BP: (105-146)/(63-97) 123/87 (03/09 0810) SpO2:  [93 %-100 %] 93 % (03/09 0810)     Intake/Output from previous day: 03/08 0701 - 03/09 0700 In: 1180 [P.O.:980; IV Piggyback:200] Out: 2675 [Urine:2675] Intake/Output this shift: Total I/O In: -  Out: 300 [Urine:300]  General appearance: alert, cooperative and no distress Heart: regular rate and rhythm, S1, S2 normal, no murmur, click, rub or gallop Lungs: clear to auscultation bilaterally Abdomen: soft, non-tender; bowel sounds normal; no masses,  no organomegaly Extremities: extremities normal, atraumatic, no cyanosis or edema and painful to the touch Wound: clean, wound vac with good suction  Lab Results: No results for input(s): WBC, HGB, HCT, PLT in the last 72 hours. BMET: No results for input(s): NA, K, CL, CO2, GLUCOSE, BUN, CREATININE, CALCIUM in the last 72 hours.  PT/INR: No results for input(s): LABPROT, INR in the last 72 hours. ABG    Component Value Date/Time   PHART 7.509 (H) 12/24/2018 0311   HCO3 24.7 12/24/2018 0311   O2SAT 94.5 12/24/2018 0311   CBG (last 3)  Recent Labs    01/09/19 1714 01/09/19 2039 01/10/19 0635  GLUCAP 111* 182* 96    Assessment/Plan: S/P Procedure(s) (LRB): TRANSESOPHAGEAL ECHOCARDIOGRAM (TEE) (N/A)  Postop day 17 debridement of chest wall abscess and placement of a wound VAC with subsequent debridement in the OR 3 days later.   1.  Continue wound VAC changes 3 times weekly.  2. Continue IV cefazolin for methicillin sensitive Staphylococcus aureus. No recent fevers and no recent labs.  3. Continue SCDs for DVT prophylaxis 4. Hemodynamically stable and in NSR.  5. Tolerating room air with good oxygen saturation. 6. Continues to have pain. Taking oxycodone 10-15mg  q 3 hours PRN and on home dose of Gabapentin. Will add Toradol 15mg  IV q6 hours PRN. Would prefer not to increase narcotics due to past history of IV drug abuse.    Plan: Dr. Laneta Simmers plans to repeat a chest CT near the completion date of the IV antibiotics (March 20). Will order labs for tomorrow morning to monitor WBC and kidney function.      LOS: 17 days    Sharlene Dory 01/10/2019   Chart reviewed, patient examined, agree with above. He says he is having more chest wall pain on the right and left side. There is no erythema. Chest wall is mildly tender but no change over the past couple weeks. He remains afebrile. I am not sure if this is related to some undrained collection of fluid or just the postop inflammation from his prior debridement. 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 I will order a chest CT to re-evaluate the chest. I was going to wait until prior to discharge but with increased pain will do it now.  Planning VAC change today by [facility] nurse.

## 2019-01-10 NOTE — Consult Note (Signed)
WOC Nurse wound follow up Patient receiving care in [facility] 4E27.  No family present.   Wound type: NPWT with one piece of black foam removed from sternal wound.   Measurement: 1.8 cm x 6.2 cm x 0.4 cm Wound bed: 98% red granulation tissue. Minimal bleeding with foam removal Drainage (amount, consistency, odor) scant bleeding from wound bed, no purulence, no odor, no induration Periwound: intact. Dressing procedure/placement/frequency: Cleanse sternal wound with no rinse cleanser. Pat dry. Apply Xeroform gauze [place] # [id number]) over wound bed. Cover with dry gauze. Tape in place. Change daily. Monitor the wound area(s) for worsening of condition such as: Signs/symptoms of infection,  Increase in size,  Development of or worsening of odor, Development of pain, or increased pain at the affected locations.  Notify the medical team if any of these develop.  Thank you for the consult.  Discussed plan of care with the patient and bedside nurse.  WOC nurse will not follow at this time.  Please re-consult the WOC team if needed.  Helmut Muster, RN, MSN, CWOCN, CNS-BC, pager [phone]

## 2019-01-10 NOTE — Progress Notes (Signed)
Wound VAC removed by wound care nurse. Mid sternal wound has mostly clean, granulating tissue. No purulence noted. Dressing changes per wound care nurse recommendations.

## 2019-01-11 LAB — BASIC METABOLIC PANEL
Anion gap: 7 (ref 5–15)
BUN: 11 mg/dL (ref 6–20)
CO2: 29 mmol/L (ref 22–32)
Calcium: 8.7 mg/dL — ABNORMAL LOW (ref 8.9–10.3)
Chloride: 100 mmol/L (ref 98–111)
Creatinine, Ser: 0.94 mg/dL (ref 0.61–1.24)
GFR calc Af Amer: >60 mL/min (ref >60)
GFR calc non Af Amer: >60 mL/min (ref >60)
Glucose, Bld: 153 mg/dL — ABNORMAL HIGH (ref 70–99)
Potassium: 3.8 mmol/L (ref 3.5–5.1)
SODIUM: 136 mmol/L (ref 135–145)

## 2019-01-11 LAB — CBC
HCT: 31.5 % — ABNORMAL LOW (ref 39.0–52.0)
HEMOGLOBIN: 10.1 g/dL — AB (ref 13.0–17.0)
MCH: 29.6 pg (ref 26.0–34.0)
MCHC: 32.1 g/dL (ref 30.0–36.0)
MCV: 92.4 fL (ref 80.0–100.0)
Platelets: 42 10*3/uL — ABNORMAL LOW (ref 150–400)
RBC: 3.41 MIL/uL — ABNORMAL LOW (ref 4.22–5.81)
RDW: 14.6 % (ref 11.5–15.5)
WBC: 2.3 10*3/uL — ABNORMAL LOW (ref 4.0–10.5)
nRBC: 0 % (ref 0.0–0.2)

## 2019-01-11 LAB — GLUCOSE, CAPILLARY
Glucose-Capillary: 174 mg/dL — ABNORMAL HIGH (ref 70–99)
Glucose-Capillary: 173 mg/dL — ABNORMAL HIGH (ref 70–99)
Glucose-Capillary: 148 mg/dL — ABNORMAL HIGH (ref 70–99)
Glucose-Capillary: 164 mg/dL — ABNORMAL HIGH (ref 70–99)

## 2019-01-11 NOTE — Progress Notes (Addendum)
[address]       [place] [address]             [phone]      8 Days Post-Op Procedure(s) (LRB): TRANSESOPHAGEAL ECHOCARDIOGRAM (TEE) (N/A) Subjective: Sleeping when I entered the room. When awake was angry he had missed his dose of pain medication.   Objective: Vital signs in last 24 hours: Temp:  [97.6 F (36.4 C)-98.3 F (36.8 C)] 97.6 F (36.4 C) (03/10 0730) Pulse Rate:  [70-82] 70 (03/10 0730) Cardiac Rhythm: Normal sinus rhythm (03/10 0416) Resp:  [12-21] 12 (03/10 0730) BP: (106-144)/(74-92) 117/74 (03/10 0730) SpO2:  [96 %-98 %] 98 % (03/10 0730)    Intake/Output from previous day: 03/09 0701 - 03/10 0700 In: 720 [P.O.:420; IV Piggyback:300] Out: 1350 [Urine:1350] Intake/Output this shift: Total I/O In: -  Out: 300 [Urine:300]  General appearance: alert, cooperative and no distress Heart: regular rate and rhythm, S1, S2 normal, no murmur, click, rub or gallop Lungs: clear to auscultation bilaterally Abdomen: soft, non-tender; bowel sounds normal; no masses,  no organomegaly Extremities: extremities normal, atraumatic, no cyanosis or edema Wound: clean and dry-dressed wet-to-dry this morning  Lab Results: Recent Labs    01/11/19 0353  WBC 2.3*  HGB 10.1*  HCT 31.5*  PLT 42*   BMET:  Recent Labs    01/10/19 1635 01/11/19 0353  NA  --  136  K  --  3.8  CL  --  100  CO2  --  29  GLUCOSE  --  153*  BUN  --  11  CREATININE 0.82 0.94  CALCIUM  --  8.7*    PT/INR: No results for input(s): LABPROT, INR in the last 72 hours. ABG    Component Value Date/Time   PHART 7.509 (H) 12/24/2018 0311   HCO3 24.7 12/24/2018 0311   O2SAT 94.5 12/24/2018 0311   CBG (last 3)  Recent Labs    01/10/19 1706 01/10/19 2131 01/11/19 0621  GLUCAP 136* 185* 174*    Assessment/Plan: S/P Procedure(s) (LRB): TRANSESOPHAGEAL ECHOCARDIOGRAM (TEE) (N/A)  1. Continue wound VAC changes 3 times weekly. Just changed yesterday and there  was mostly clean granulating tissue. No purulence. CT scan showed:  IMPRESSION: 1. No evidence of significant pulmonary embolus. No aortic aneurysm or dissection. 2. Significant interval decrease in size of previous loculated fluid collections in the presternal and retrosternal spaces. No significant residual fluid collection. There is some residual soft tissue stranding and edema. There is evidence of bone destruction at the sternomanubrial joint suggesting residual osteomyelitis. 3. Skin defect over the sternum with skin thickening consistent with cellulitis and open wound. 4. Hepatic cirrhosis with splenic enlargement.    2. Continue IV cefazolin for methicillin sensitive Staphylococcus aureus. No recent fevers. WBC low at 2.3.  3. Continue SCDs for DVT prophylaxis 4. Hemodynamically stable and in NSR.  5. Tolerating room air with good oxygen saturation. 6. Pain control- Taking oxycodone 10-15mg  q 3 hours PRN and on home dose of Gabapentin. AddedToradol 15mg  IV q6 hours PRN. Would prefer not to increase narcotics due to past history of IV drug abuse.    Plan: Labs look okay-continue Toradol for pain,creatinine 0.94. Will discuss the CT scan with Dr. Linde Gillis may need some additional debridement due to the bone destruction at the sternomanubrial joint.    LOS: 18 days    Sharlene Dory 01/11/2019  Chart reviewed, patient examined, agree with above. CT chest reviewed. 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 This was not present on the previous CT. This will require further debridement. I discussed it with patient and will plan to do Thursday. He will require VAC dressings again. Continue antibiotics and will likely need longer course. Discuss with ID.

## 2019-01-12 LAB — GLUCOSE, CAPILLARY
Glucose-Capillary: 123 mg/dL — ABNORMAL HIGH (ref 70–99)
Glucose-Capillary: 166 mg/dL — ABNORMAL HIGH (ref 70–99)
Glucose-Capillary: 207 mg/dL — ABNORMAL HIGH (ref 70–99)
Glucose-Capillary: 180 mg/dL — ABNORMAL HIGH (ref 70–99)

## 2019-01-12 NOTE — Progress Notes (Signed)
Pt still sleeping soundly in bed. Appears very comfortable. Will continue to monitor pt.

## 2019-01-12 NOTE — Progress Notes (Signed)
TCTS:   Plan to do debridement in the OR Friday am. Patient notified.

## 2019-01-12 NOTE — Progress Notes (Signed)
Pt reassessed and is snoring loudly in the bed. Vitals stable. Will continue to monitor.

## 2019-01-12 NOTE — Progress Notes (Addendum)
      [address]       [place] [address]             [phone]      9 Days Post-Op Procedure(s) (LRB): TRANSESOPHAGEAL ECHOCARDIOGRAM (TEE) (N/A) Subjective: Feels good this morning. He feels like his pain is well controlled on the current regimen. He knows he is going to the OR tomorrow.  Objective: Vital signs in last 24 hours: Temp:  [97.8 F (36.6 C)-98.6 F (37 C)] 98.3 F (36.8 C) (03/11 0606) Pulse Rate:  [79-83] 83 (03/10 2315) Cardiac Rhythm: Normal sinus rhythm (03/11 0606) Resp:  [13] 13 (03/10 1925) BP: (110)/(72) 110/72 (03/10 1925) SpO2:  [94 %-99 %] 94 % (03/11 0606)    Intake/Output from previous day: 03/10 0701 - 03/11 0700 In: 10 [I.V.:10] Out: 1450 [Urine:1450] Intake/Output this shift: Total I/O In: -  Out: 300 [Urine:300]  General appearance: alert, cooperative and no distress Heart: regular rate and rhythm, S1, S2 normal, no murmur, click, rub or gallop Lungs: clear to auscultation bilaterally Abdomen: soft, non-tender; bowel sounds normal; no masses,  no organomegaly Extremities: extremities normal, atraumatic, no cyanosis or edema Wound: wound is dressed wet-to-dry  Lab Results: Recent Labs    01/11/19 0353  WBC 2.3*  HGB 10.1*  HCT 31.5*  PLT 42*   BMET:  Recent Labs    01/10/19 1635 01/11/19 0353  NA  --  136  K  --  3.8  CL  --  100  CO2  --  29  GLUCOSE  --  153*  BUN  --  11  CREATININE 0.82 0.94  CALCIUM  --  8.7*    PT/INR: No results for input(s): LABPROT, INR in the last 72 hours. ABG    Component Value Date/Time   PHART 7.509 (H) 12/24/2018 0311   HCO3 24.7 12/24/2018 0311   O2SAT 94.5 12/24/2018 0311   CBG (last 3)  Recent Labs    01/11/19 1604 01/11/19 2130 01/12/19 0607  GLUCAP 148* 164* 123*    Assessment/Plan: S/P Procedure(s) (LRB): TRANSESOPHAGEAL ECHOCARDIOGRAM (TEE) (N/A)   1. Continue to change dressing three times weekly. To the OR tomorrow for further bone  debridement. 2.Continue IV cefazolin for methicillin sensitive Staphylococcus aureus.No recent fevers. WBC low at 2.3.  3.Continue SCDs for DVT prophylaxis 4. Hemodynamically stable and in NSR.  5. Tolerating room air with good oxygen saturation. 6. Pain control- Taking oxycodone 10-15mg  q 3 hours PRN and on home dose of Gabapentin. On Fentanyl 12.5-25mcg q 2 hours PRN. Added Toradol 15mg  IV q6 hours PRN. Would prefer not to increase narcotics due to past history of IV drug abuse.  Plan: Question as to if the patient still needs Fentanyl IV-spoke to the patient about weaning IV medications after his surgery tomorrow. He agrees to try just oral Oxycodone if he can have a slightly higher dose. To the OR tomorrow for further debridement.    LOS: 19 days    Sharlene Dory 01/12/2019   Chart reviewed, patient examined, agree with above. Plan debridement of manubrial-sternal joint in OR tomorrow or Friday depending on the schedule. I told him I would let him know which day later today.

## 2019-01-12 NOTE — Progress Notes (Signed)
Attempted to give am meds to pt. Pt in deep sleep. Will continue to monitor and try again later for meds.

## 2019-01-13 DIAGNOSIS — D61818 Other pancytopenia: Secondary | ICD-10-CM

## 2019-01-13 DIAGNOSIS — D72819 Decreased white blood cell count, unspecified: Secondary | ICD-10-CM

## 2019-01-13 LAB — GLUCOSE, CAPILLARY
Glucose-Capillary: 133 mg/dL — ABNORMAL HIGH (ref 70–99)
Glucose-Capillary: 181 mg/dL — ABNORMAL HIGH (ref 70–99)
Glucose-Capillary: 130 mg/dL — ABNORMAL HIGH (ref 70–99)
Glucose-Capillary: 145 mg/dL — ABNORMAL HIGH (ref 70–99)

## 2019-01-13 LAB — CBC WITH DIFFERENTIAL/PLATELET
Abs Immature Granulocytes: 0.02 10*3/uL (ref 0.00–0.07)
Basophils Absolute: 0 10*3/uL (ref 0.0–0.1)
Basophils Relative: 1 %
Eosinophils Absolute: 0.1 10*3/uL (ref 0.0–0.5)
Eosinophils Relative: 6 %
HCT: 31.9 % — ABNORMAL LOW (ref 39.0–52.0)
Hemoglobin: 10.1 g/dL — ABNORMAL LOW (ref 13.0–17.0)
Immature Granulocytes: 1 %
Lymphocytes Relative: 27 %
Lymphs Abs: 0.5 10*3/uL — ABNORMAL LOW (ref 0.7–4.0)
MCH: 29.4 pg (ref 26.0–34.0)
MCHC: 31.7 g/dL (ref 30.0–36.0)
MCV: 93 fL (ref 80.0–100.0)
Monocytes Absolute: 0.3 10*3/uL (ref 0.1–1.0)
Monocytes Relative: 16 %
NEUTROS PCT: 49 %
NRBC: 0 % (ref 0.0–0.2)
Neutro Abs: 0.9 10*3/uL — ABNORMAL LOW (ref 1.7–7.7)
Platelets: 48 10*3/uL — ABNORMAL LOW (ref 150–400)
RBC: 3.43 MIL/uL — ABNORMAL LOW (ref 4.22–5.81)
RDW: 14.7 % (ref 11.5–15.5)
WBC: 1.8 10*3/uL — ABNORMAL LOW (ref 4.0–10.5)

## 2019-01-13 NOTE — Progress Notes (Signed)
Pancytopenia noted, particularly leukopenia and thrombocytopenia.  Could be cefazolin issue.  Will see what it looks like tomorrow and consider changing antibiotics.   Gardiner Barefoot, MD

## 2019-01-13 NOTE — Progress Notes (Addendum)
      [address]       [place] [address]             [phone]      10 Days Post-Op Procedure(s) (LRB): TRANSESOPHAGEAL ECHOCARDIOGRAM (TEE) (N/A) Subjective: Feels ok, no new c/o  Objective: Vital signs in last 24 hours: Temp:  [98.1 F (36.7 C)] 98.1 F (36.7 C) (03/11 2355) Pulse Rate:  [77-80] 77 (03/11 2355) Cardiac Rhythm: Normal sinus rhythm (03/12 0250) Resp:  [16-19] 19 (03/11 2355) BP: (110-126)/(69-81) 110/69 (03/11 2355) SpO2:  [95 %-97 %] 95 % (03/11 2355)  Hemodynamic parameters for last 24 hours:    Intake/Output from previous day: 03/11 0701 - 03/12 0700 In: 450 [P.O.:240; I.V.:10; IV Piggyback:200] Out: 650 [Urine:650] Intake/Output this shift: No intake/output data recorded.  General appearance: alert, cooperative and no distress Heart: regular rate and rhythm Lungs: clear to auscultation bilaterally Abdomen: benign Extremities: no edema Wound: dressing CDI  Lab Results: Recent Labs    01/11/19 0353  WBC 2.3*  HGB 10.1*  HCT 31.5*  PLT 42*   BMET:  Recent Labs    01/10/19 1635 01/11/19 0353  NA  --  136  K  --  3.8  CL  --  100  CO2  --  29  GLUCOSE  --  153*  BUN  --  11  CREATININE 0.82 0.94  CALCIUM  --  8.7*    PT/INR: No results for input(s): LABPROT, INR in the last 72 hours. ABG    Component Value Date/Time   PHART 7.509 (H) 12/24/2018 0311   HCO3 24.7 12/24/2018 0311   O2SAT 94.5 12/24/2018 0311   CBG (last 3)  Recent Labs    01/12/19 1622 01/12/19 2125 01/13/19 0633  GLUCAP 207* 180* 133*    Meds Scheduled Meds: . aspirin EC  81 mg Oral Daily  . DULoxetine  60 mg Oral Daily  . gabapentin  600 mg Oral TID  . Influenza vac split quadrivalent PF  0.5 mL Intramuscular Tomorrow-1000  . insulin aspart  0-15 Units Subcutaneous TID AC & HS  . insulin glargine  15 Units Subcutaneous Daily  . linagliptin  5 mg Oral Daily  . metFORMIN  1,000 mg Oral BID WC  . pneumococcal 23 valent vaccine   0.5 mL Intramuscular Tomorrow-1000  . senna-docusate  1 tablet Oral QHS  . sodium chloride flush  10-40 mL Intracatheter Q12H   Continuous Infusions: .  ceFAZolin (ANCEF) IV 2 g (01/13/19 0555)   PRN Meds:.fentaNYL (SUBLIMAZE) injection, ketorolac, lactulose, ondansetron (ZOFRAN) IV, oxyCODONE, sodium chloride flush, traMADol, traZODone  Xrays No results found.  Assessment/Plan: S/P Procedure(s) (LRB): TRANSESOPHAGEAL ECHOCARDIOGRAM (TEE) (N/A)  1 stable , plan is for return to OR tomorrow for debridement 2 recheck CBC with diff, to f/u on pancytopenia   LOS: 20 days    Rowe Clack 01/13/2019 10 Days Post-Op Procedure(s) (LRB): TRANSESOPHAGEAL ECHOCARDIOGRAM (TEE) (N/A)  Agree with above.   Alleen Borne, MD.

## 2019-01-14 ENCOUNTER — Encounter (HOSPITAL_COMMUNITY): Payer: Self-pay | Admitting: Registered Nurse

## 2019-01-14 ENCOUNTER — Inpatient Hospital Stay (HOSPITAL_COMMUNITY): Payer: Medicaid Other | Admitting: Anesthesiology

## 2019-01-14 ENCOUNTER — Encounter (HOSPITAL_COMMUNITY)
Admission: EM | Disposition: A | Discharge status: Home Health | Payer: Self-pay | Source: Home / Self Care | Attending: Surgery

## 2019-01-14 DIAGNOSIS — L02213 Cutaneous abscess of chest wall: Secondary | ICD-10-CM

## 2019-01-14 HISTORY — PX: I&D EXTREMITY: SHX5045

## 2019-01-14 HISTORY — PX: APPLICATION OF WOUND VAC: SHX5189

## 2019-01-14 LAB — GLUCOSE, CAPILLARY
Glucose-Capillary: 118 mg/dL — ABNORMAL HIGH (ref 70–99)
Glucose-Capillary: 116 mg/dL — ABNORMAL HIGH (ref 70–99)
Glucose-Capillary: 100 mg/dL — ABNORMAL HIGH (ref 70–99)
Glucose-Capillary: 193 mg/dL — ABNORMAL HIGH (ref 70–99)
Glucose-Capillary: 131 mg/dL — ABNORMAL HIGH (ref 70–99)

## 2019-01-14 LAB — CBC
HCT: 38.3 % — ABNORMAL LOW (ref 39.0–52.0)
Hemoglobin: 12.4 g/dL — ABNORMAL LOW (ref 13.0–17.0)
MCH: 30.2 pg (ref 26.0–34.0)
MCHC: 32.4 g/dL (ref 30.0–36.0)
MCV: 93.4 fL (ref 80.0–100.0)
Platelets: 43 10*3/uL — ABNORMAL LOW (ref 150–400)
RBC: 4.1 MIL/uL — ABNORMAL LOW (ref 4.22–5.81)
RDW: 15 % (ref 11.5–15.5)
WBC: 1.5 10*3/uL — ABNORMAL LOW (ref 4.0–10.5)
nRBC: 0 % (ref 0.0–0.2)

## 2019-01-14 LAB — PATHOLOGIST SMEAR REVIEW

## 2019-01-14 SURGERY — IRRIGATION AND DEBRIDEMENT EXTREMITY
Anesthesia: General | Site: Chest

## 2019-01-14 MED ORDER — SODIUM CHLORIDE 0.9 % IV SOLN
INTRAVENOUS | Status: AC
  Filled 2019-01-14: qty 500000

## 2019-01-14 MED ORDER — PHENYLEPHRINE 40 MCG/ML (10ML) SYRINGE FOR IV PUSH (FOR BLOOD PRESSURE SUPPORT)
PREFILLED_SYRINGE | INTRAVENOUS | Status: DC | PRN
  Administered 2019-01-14: 120 ug via INTRAVENOUS
  Administered 2019-01-14 (×2): 80 ug via INTRAVENOUS
  Administered 2019-01-14: 120 ug via INTRAVENOUS

## 2019-01-14 MED ORDER — ONDANSETRON HCL 4 MG/2ML IJ SOLN
INTRAMUSCULAR | Status: DC | PRN
  Administered 2019-01-14: 4 mg via INTRAVENOUS

## 2019-01-14 MED ORDER — LACTATED RINGERS IV SOLN
INTRAVENOUS | Status: DC
  Administered 2019-01-14: 10:00:00 via INTRAVENOUS

## 2019-01-14 MED ORDER — 0.9 % SODIUM CHLORIDE (POUR BTL) OPTIME
TOPICAL | Status: DC | PRN
  Administered 2019-01-14: 1000 mL

## 2019-01-14 MED ORDER — OXYCODONE HCL 5 MG PO TABS
ORAL_TABLET | ORAL | Status: AC
  Filled 2019-01-14: qty 3

## 2019-01-14 MED ORDER — FENTANYL CITRATE (PF) 250 MCG/5ML IJ SOLN
INTRAMUSCULAR | Status: AC
  Filled 2019-01-14: qty 5

## 2019-01-14 MED ORDER — VANCOMYCIN HCL 10 G IV SOLR
1250.0000 mg | Freq: Two times a day (BID) | INTRAVENOUS | Status: DC
  Administered 2019-01-15 – 2019-01-17 (×6): 1250 mg via INTRAVENOUS
  Filled 2019-01-14 (×7): qty 1250

## 2019-01-14 MED ORDER — PROMETHAZINE HCL 25 MG/ML IJ SOLN: 6.2500 mg | INTRAMUSCULAR | Status: DC | PRN

## 2019-01-14 MED ORDER — FENTANYL CITRATE (PF) 100 MCG/2ML IJ SOLN: 25.0000 ug | INTRAMUSCULAR | Status: DC | PRN

## 2019-01-14 MED ORDER — LIDOCAINE 2% (20 MG/ML) 5 ML SYRINGE
INTRAMUSCULAR | Status: DC | PRN
  Administered 2019-01-14: 20 mg via INTRAVENOUS

## 2019-01-14 MED ORDER — MIDAZOLAM HCL 2 MG/2ML IJ SOLN
INTRAMUSCULAR | Status: AC
  Filled 2019-01-14: qty 2

## 2019-01-14 MED ORDER — MIDAZOLAM HCL 5 MG/5ML IJ SOLN
INTRAMUSCULAR | Status: DC | PRN
  Administered 2019-01-14: 2 mg via INTRAVENOUS

## 2019-01-14 MED ORDER — SODIUM CHLORIDE 0.9 % IV SOLN
INTRAVENOUS | Status: DC | PRN
  Administered 2019-01-14: 20 ug/min via INTRAVENOUS

## 2019-01-14 MED ORDER — KETAMINE HCL 50 MG/5ML IJ SOSY
PREFILLED_SYRINGE | INTRAMUSCULAR | Status: AC
  Filled 2019-01-14: qty 5

## 2019-01-14 MED ORDER — VANCOMYCIN HCL 10 G IV SOLR
1500.0000 mg | Freq: Once | INTRAVENOUS | Status: AC
  Administered 2019-01-14: 1500 mg via INTRAVENOUS
  Filled 2019-01-14: qty 1500

## 2019-01-14 MED ORDER — PROPOFOL 10 MG/ML IV BOLUS
INTRAVENOUS | Status: DC | PRN
  Administered 2019-01-14: 150 mg via INTRAVENOUS

## 2019-01-14 MED ORDER — KETOROLAC TROMETHAMINE 15 MG/ML IJ SOLN
INTRAMUSCULAR | Status: AC
  Filled 2019-01-14: qty 1

## 2019-01-14 MED ORDER — VANCOMYCIN HCL 1000 MG IV SOLR
INTRAVENOUS | Status: AC
  Filled 2019-01-14: qty 1000

## 2019-01-14 MED ORDER — FENTANYL CITRATE (PF) 100 MCG/2ML IJ SOLN
INTRAMUSCULAR | Status: DC | PRN
  Administered 2019-01-14: 50 ug via INTRAVENOUS

## 2019-01-14 MED ORDER — FENTANYL CITRATE (PF) 100 MCG/2ML IJ SOLN
50.0000 ug | INTRAMUSCULAR | Status: DC | PRN
  Administered 2019-01-17 – 2019-01-23 (×3): 50 ug via INTRAVENOUS
  Filled 2019-01-14 (×3): qty 2

## 2019-01-14 MED ORDER — PROPOFOL 10 MG/ML IV BOLUS
INTRAVENOUS | Status: AC
  Filled 2019-01-14: qty 20

## 2019-01-14 MED ORDER — LACTATED RINGERS IV SOLN
INTRAVENOUS | Status: DC | PRN
  Administered 2019-01-14: 10:00:00 via INTRAVENOUS

## 2019-01-14 SURGICAL SUPPLY — 34 items
BANDAGE ACE 4X5 VEL STRL LF (GAUZE/BANDAGES/DRESSINGS) IMPLANT
BANDAGE ACE 6X5 VEL STRL LF (GAUZE/BANDAGES/DRESSINGS) IMPLANT
BNDG GAUZE ELAST 4 BULKY (GAUZE/BANDAGES/DRESSINGS) IMPLANT
CANISTER SUCT 3000ML PPV (MISCELLANEOUS) ×3 IMPLANT
CANISTER WOUNDNEG PRESSURE 500 (CANNISTER) ×3 IMPLANT
CONT SPEC 4OZ CLIKSEAL STRL BL (MISCELLANEOUS) IMPLANT
COVER SURGICAL LIGHT HANDLE (MISCELLANEOUS) ×6 IMPLANT
COVER WAND RF STERILE (DRAPES) ×3 IMPLANT
DRSG VAC ATS LRG SENSATRAC (GAUZE/BANDAGES/DRESSINGS) IMPLANT
DRSG VAC ATS MED SENSATRAC (GAUZE/BANDAGES/DRESSINGS) IMPLANT
DRSG VAC ATS SM SENSATRAC (GAUZE/BANDAGES/DRESSINGS) ×3 IMPLANT
ELECT REM PT RETURN 9FT ADLT (ELECTROSURGICAL) ×3
ELECTRODE REM PT RTRN 9FT ADLT (ELECTROSURGICAL) ×1 IMPLANT
GAUZE SPONGE 4X4 12PLY STRL (GAUZE/BANDAGES/DRESSINGS) IMPLANT
GLOVE BIO SURGEON STRL SZ 6.5 (GLOVE) ×4 IMPLANT
GLOVE BIO SURGEONS STRL SZ 6.5 (GLOVE) ×2
GLOVE BIOGEL PI IND STRL 6.5 (GLOVE) ×1 IMPLANT
GLOVE BIOGEL PI INDICATOR 6.5 (GLOVE) ×2
GLOVE EUDERMIC 7 POWDERFREE (GLOVE) ×3 IMPLANT
GOWN STRL REUS W/ TWL LRG LVL3 (GOWN DISPOSABLE) ×3 IMPLANT
GOWN STRL REUS W/ TWL XL LVL3 (GOWN DISPOSABLE) ×1 IMPLANT
GOWN STRL REUS W/TWL LRG LVL3 (GOWN DISPOSABLE) ×6
GOWN STRL REUS W/TWL XL LVL3 (GOWN DISPOSABLE) ×2
KIT BASIN OR (CUSTOM PROCEDURE TRAY) ×3 IMPLANT
KIT TURNOVER KIT B (KITS) ×3 IMPLANT
NS IRRIG 1000ML POUR BTL (IV SOLUTION) ×3 IMPLANT
PACK GENERAL/GYN (CUSTOM PROCEDURE TRAY) ×3 IMPLANT
PACK UNIVERSAL I (CUSTOM PROCEDURE TRAY) ×3 IMPLANT
PAD ARMBOARD 7.5X6 YLW CONV (MISCELLANEOUS) ×6 IMPLANT
SUT VIC AB 3-0 SH 27 (SUTURE)
SUT VIC AB 3-0 SH 27X BRD (SUTURE) IMPLANT
TOWEL GREEN STERILE (TOWEL DISPOSABLE) ×3 IMPLANT
TOWEL GREEN STERILE FF (TOWEL DISPOSABLE) ×3 IMPLANT
WATER STERILE IRR 1000ML POUR (IV SOLUTION) ×3 IMPLANT

## 2019-01-14 NOTE — Anesthesia Postprocedure Evaluation (Signed)
Anesthesia Post Note  Patient: Chase Reed  Procedure(s) Performed: DEBRIDEMENT OF STERNOCLAVICULAR JOINT (N/A Chest) APPLICATION OF WOUND VAC (N/A Chest)     Patient location during evaluation: PACU Anesthesia Type: General Level of consciousness: awake and alert Pain management: pain level controlled Vital Signs Assessment: post-procedure vital signs reviewed and stable Respiratory status: spontaneous breathing, nonlabored ventilation, respiratory function stable and patient connected to nasal cannula oxygen Cardiovascular status: blood pressure returned to baseline and stable Postop Assessment: no apparent nausea or vomiting Anesthetic complications: no    Last Vitals:  Vitals:   01/14/19 1241 01/14/19 1325  BP: 108/81 119/77  Pulse: 69 73  Resp: 11 17  Temp:  37.2 C  SpO2: 100% 97%    Last Pain:  Vitals:   01/14/19 1325  TempSrc: Oral  PainSc:                  [name]

## 2019-01-14 NOTE — Progress Notes (Addendum)
Contact removed.  Nurse, Rinaldo Cloud,  contacted and states she will come pick up contact from short stay

## 2019-01-14 NOTE — Anesthesia Procedure Notes (Signed)
Procedure Name: LMA Insertion Date/Time: 01/14/2019 10:24 AM Performed by: Demetrio Lapping, CRNA Pre-anesthesia Checklist: Patient identified, Emergency Drugs available, Suction available and Patient being monitored Patient Re-evaluated:Patient Re-evaluated prior to induction Oxygen Delivery Method: Circle System Utilized Preoxygenation: Pre-oxygenation with 100% oxygen Induction Type: IV induction Ventilation: Mask ventilation without difficulty LMA: LMA inserted LMA Size: 4.0 Number of attempts: 1 Placement Confirmation: positive ETCO2 Tube secured with: Tape Dental Injury: Teeth and Oropharynx as per pre-operative assessment

## 2019-01-14 NOTE — Transfer of Care (Signed)
Immediate Anesthesia Transfer of Care Note  Patient: Chase Reed  Procedure(s) Performed: DEBRIDEMENT OF STERNOCLAVICULAR JOINT (N/A Chest) APPLICATION OF WOUND VAC (N/A Chest)  Patient Location: PACU  Anesthesia Type:General  Level of Consciousness: awake and patient cooperative  Airway & Oxygen Therapy: Patient Spontanous Breathing and Patient connected to nasal cannula oxygen  Post-op Assessment: Report given to RN and Post -op Vital signs reviewed and stable  Post vital signs: Reviewed and stable  Last Vitals:  Vitals Value Taken Time  BP 115/81 01/14/2019 11:41 AM  Temp    Pulse 84 01/14/2019 11:41 AM  Resp 13 01/14/2019 11:41 AM  SpO2 98 % 01/14/2019 11:41 AM  Vitals shown include unvalidated device data.  Last Pain:  Vitals:   01/14/19 0904  TempSrc: Oral  PainSc:       Patients Stated Pain Goal: 0 (01/13/19 2020)  Complications: No apparent anesthesia complications

## 2019-01-14 NOTE — Progress Notes (Signed)
Pharmacy Antibiotic Note  Chase Reed is a 53 y.o. male admitted on 12/23/2018 with SOB and severe chest pain.  Diagnosed with MSSA bacteremia and sternal abscess.  Patient has been on Ancef, but with worsening leukopenia Pharmacy has been consulted to switch antibiotic to vancomycin.  ID still plans to treat through 01/21/19.  SCr 0.94 from 01/11/19, CrCL 101 ml/min, afebrile, WBC 1.5.  Plan: Vanc 1500mg  IV x 1, then 1250mg  IV Q12H for AUC 518 using SCr 0.94 Monitor renal fxn, clinical progress, vanc AUC as indicated BMET in AM   Height: 6' (182.9 cm) Weight: 167 lb 8.8 oz (76 kg)(scale B) IBW/kg (Calculated) : 77.6  Temp (24hrs), Avg:98.2 F (36.8 C), Min:97.7 F (36.5 C), Max:98.9 F (37.2 C)  Recent Labs  Lab 01/10/19 1635 01/11/19 0353 01/13/19 1111 01/14/19 1630  WBC  --  2.3* 1.8* 1.5*  CREATININE 0.82 0.94  --   --     Estimated Creatinine Clearance: 101.1 mL/min (by C-G formula based on SCr of 0.94 mg/dL).    No Known Allergies   Ancef >> 3/13 Vanc 3/13 >> (3/20)  2/20 BCx - MSSA 2/21 surgical screen - positive MRSA/MSSA 2/21 chest wall abscess - MSSA 2/22  BCx - negative 3/13 sternal tissue -  Jaxie Racanelli D. Laney Potash, PharmD, BCPS, BCCCP 01/14/2019, 5:54 PM

## 2019-01-14 NOTE — Progress Notes (Signed)
Worsening leukopenia on cefazolin.  Has been low in the past but down.  Thrombocytopenia has been about at his baseline.    At this time, I am going to change him from cefazolin to vancomycin and will monitor his WBC.   Duration will remain the same  Gardiner Barefoot, MD

## 2019-01-14 NOTE — Anesthesia Preprocedure Evaluation (Addendum)
Anesthesia Evaluation  Patient identified by MRN, date of birth, ID band Patient awake    Reviewed: Allergy & Precautions, NPO status , Patient's Chart, lab work & pertinent test results  History of Anesthesia Complications Negative for: history of anesthetic complications  Airway Mallampati: II  TM Distance: >3 FB Neck ROM: Full    Dental  (+) Dental Advisory Given, Teeth Intact   Pulmonary Current Smoker,    breath sounds clear to auscultation       Cardiovascular hypertension, Pt. on medications (-) angina Rhythm:Regular Rate:Normal   '20 TTE - left ventricle has hyperdynamic systolic function, with an ejection fraction of >65%. Trivial pericardial effusion is present. The pericardial effusion is circumferential. No definite valvular vegetations. Image quality is not sufficient for very small valvular lesions.   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 neurological ROS     GI/Hepatic negative GI ROS, (+)     substance abuse  IV drug use, Hepatitis -  Endo/Other  diabetes, Type 2, Insulin Dependent, Oral Hypoglycemic Agents Hyponatremia Hypocalcemia  Renal/GU negative Renal ROS     Musculoskeletal negative musculoskeletal ROS (+) narcotic dependent  Abdominal   Peds  Hematology  (+) anemia ,  Thrombocytopenia    Anesthesia Other Findings   Reproductive/Obstetrics                             Lab Results  Component Value Date   WBC 1.8 (L) 01/13/2019   HGB 10.1 (L) 01/13/2019   HCT 31.9 (L) 01/13/2019   MCV 93.0 01/13/2019   PLT 48 (L) 01/13/2019   Lab Results  Component Value Date   CREATININE 0.94 01/11/2019   BUN 11 01/11/2019   NA 136 01/11/2019   K 3.8 01/11/2019   CL 100 01/11/2019   CO2 29 01/11/2019    Anesthesia Physical  Anesthesia Plan  ASA:  III  Anesthesia Plan: General   Post-op Pain Management:    Induction: Intravenous  PONV Risk Score and Plan: 3 and Treatment may vary due to age or medical condition, Ondansetron, Dexamethasone and Midazolam  Airway Management Planned: LMA  Additional Equipment: None  Intra-op Plan:   Post-operative Plan: Extubation in OR  Informed Consent: I have reviewed the patients History and Physical, chart, labs and discussed the procedure including the risks, benefits and alternatives for the proposed anesthesia with the patient or authorized representative who has indicated his/her understanding and acceptance.     Dental advisory given  Plan Discussed with: CRNA and Anesthesiologist  Anesthesia Plan Comments:         Anesthesia Quick Evaluation

## 2019-01-14 NOTE — Brief Op Note (Signed)
12/23/2018 - 01/14/2019  11:26 AM  PATIENT:  Chase Reed  53 y.o. male  PRE-OPERATIVE DIAGNOSIS:  Sternomanubrial JOINT INFECTION  POST-OPERATIVE DIAGNOSIS:  Sternomanubrial JOINT INFECTION  PROCEDURE:  Procedure(s): DEBRIDEMENT OF STERNOMANUBRIAL JOINT (N/A) APPLICATION OF WOUND VAC (N/A)  SURGEON:  Surgeon(s) and Role:    * Bartle, Payton Doughty, MD - Primary  PHYSICIAN ASSISTANT: NONE  ASSISTANTS: Christa McClellan   ANESTHESIA:   general  EBL:  50 mL   BLOOD ADMINISTERED:none  DRAINS: Small black VAC sponge dressing.   LOCAL MEDICATIONS USED:  NONE  SPECIMEN:  Source of Specimen:  Sternomanubrial joint   DISPOSITION OF SPECIMEN:  micro  COUNTS:  YES  TOURNIQUET:  * No tourniquets in log *  DICTATION: .Note written in EPIC  PLAN OF CARE: Admit to inpatient   PATIENT DISPOSITION:  PACU - hemodynamically stable.   Delay start of Pharmacological VTE agent (>24hrs) due to surgical blood loss or risk of bleeding: yes

## 2019-01-14 NOTE — Op Note (Signed)
  CARDIOVASCULAR SURGERY OPERATIVE NOTE  01/14/2019  Surgeon:  Alleen Borne, MD  First Assistant: Natasha Bence, RN   Preoperative Diagnosis:  Sterno-manubrial joint infection with osteomyelitis.   Postoperative Diagnosis:  Same   Procedure:  1.  Incision and debridement of sternal manubrial joint infection  Anesthesia:  General Endotracheal   Clinical History/Surgical Indication:  The patient is a 53 year old gentleman with history of intravenous drug abuse who was admitted with a large abscess in the right anterior chest wall extending down into the anterior mediastinum.  He was taken the operating room on 12/27/2018 and underwent incision and debridement of a large chest wall abscess and wound VAC placement.  He had previous blood cultures growing methicillin sensitive staph aureus and was seen by infectious disease.  It is felt that this abscess is most likely due to seeding from bacteremia related to his intravenous drug abuse.  He underwent continued VAC dressing changes until just recently when this was switched to wet-to-dry dressings.  He has been on continuous intravenous antibiotics through a PICC line and has remained hospitalized due to his history of intravenous drug abuse.  Recently he has been having more discomfort in his chest wall.  We repeated his CT scan which showed evidence of a sternomanubrial joint infection with osteomyelitis of the ends of the manubrium and sternum adjacent to the joint.  This was not present on his original CT scan.  I felt the best option will be to return him to the operating room for further debridement of this joint including the infected bone and placement of wound VAC.  I discussed the operative procedure with the patient and his parents including alternatives, benefits, and risks including but not limited to bleeding and recurrent or persistent  infection.  He understood this and agreed to proceed.  Preparation:  The patient was seen in the preoperative holding area and the correct patient, correct operation were confirmed with the patient after reviewing the medical record and catheterization. The consent was signed by me. Preoperative antibiotics were given.  After being placed under general  anesthesia by the anesthesia team the chest was prepped with betadine soap and solution and draped in the usual sterile manner. A surgical time-out was taken and the correct patient and operative procedure were confirmed with the nursing and anesthesia staff.  Incision and Debridement:   The previous wound was extended medially over the sternomanubrial joint.  It was extended down to the joint.  The joint appeared infected and partially destroyed.  The ends of the sternum and manubrium were soft and the soft bone was removed with rongeurs.  All of the synovium was removed from the joint.  Cultures of the bone and synovium were sent.  There is complete hemostasis.  Then a small wound VAC sponge was cut the appropriate size and shape and positioned in the wound.  This was covered with the airtight dressing and suction applied at 75 cm.  There was good vacuum.   All sponge, needle, and instrument counts were reported correct at the end of the case.  The patient was then transported to the tissue care unit in stable condition.

## 2019-01-14 NOTE — Progress Notes (Signed)
TCTS:   Patient is stable for incision and debridement of sterno-manubrial joint this am. He has no further questions.

## 2019-01-15 ENCOUNTER — Encounter (HOSPITAL_COMMUNITY): Payer: Self-pay | Admitting: Surgery

## 2019-01-15 LAB — BASIC METABOLIC PANEL
Anion gap: 10 (ref 5–15)
BUN: 12 mg/dL (ref 6–20)
CO2: 28 mmol/L (ref 22–32)
Calcium: 9 mg/dL (ref 8.9–10.3)
Chloride: 97 mmol/L — ABNORMAL LOW (ref 98–111)
Creatinine, Ser: 0.98 mg/dL (ref 0.61–1.24)
GFR calc Af Amer: >60 mL/min (ref >60)
GFR calc non Af Amer: >60 mL/min (ref >60)
Glucose, Bld: 133 mg/dL — ABNORMAL HIGH (ref 70–99)
Potassium: 4.4 mmol/L (ref 3.5–5.1)
Sodium: 135 mmol/L (ref 135–145)

## 2019-01-15 LAB — GLUCOSE, CAPILLARY
Glucose-Capillary: 125 mg/dL — ABNORMAL HIGH (ref 70–99)
Glucose-Capillary: 158 mg/dL — ABNORMAL HIGH (ref 70–99)
Glucose-Capillary: 138 mg/dL — ABNORMAL HIGH (ref 70–99)
Glucose-Capillary: 142 mg/dL — ABNORMAL HIGH (ref 70–99)

## 2019-01-15 LAB — CBC
HCT: 28.9 % — ABNORMAL LOW (ref 39.0–52.0)
Hemoglobin: 9.4 g/dL — ABNORMAL LOW (ref 13.0–17.0)
MCH: 30.6 pg (ref 26.0–34.0)
MCHC: 32.5 g/dL (ref 30.0–36.0)
MCV: 94.1 fL (ref 80.0–100.0)
Platelets: 48 10*3/uL — ABNORMAL LOW (ref 150–400)
RBC: 3.07 MIL/uL — ABNORMAL LOW (ref 4.22–5.81)
RDW: 15 % (ref 11.5–15.5)
WBC: 2 10*3/uL — ABNORMAL LOW (ref 4.0–10.5)
nRBC: 0 % (ref 0.0–0.2)

## 2019-01-15 NOTE — Progress Notes (Addendum)
      [address]       [place] [address]             [phone]        1 Day Post-Op Procedure(s) (LRB): DEBRIDEMENT OF STERNOCLAVICULAR JOINT (N/A) APPLICATION OF WOUND VAC (N/A)  Subjective: Patient with a lot of pain at wound VAC site. Since VAC put on yesterday, hurts to take a deep breath (had before when VAC first placed)  Objective: Vital signs in last 24 hours: Temp:  [97.7 F (36.5 C)-98.9 F (37.2 C)] 98 F (36.7 C) (03/14 0505) Pulse Rate:  [63-84] 74 (03/14 0505) Cardiac Rhythm: Normal sinus rhythm (03/13 1930) Resp:  [8-23] 15 (03/14 0505) BP: (102-119)/(67-81) 102/67 (03/14 0505) SpO2:  [95 %-100 %] 99 % (03/14 0505)   Current Weight  01/04/19 76 kg      Intake/Output from previous day: 03/13 0701 - 03/14 0700 In: 500 [I.V.:400; IV Piggyback:100] Out: 2050 [Urine:1850; Drains:150; Blood:50]   Physical Exam:  Cardiovascular: RRR Pulmonary: Clear to auscultation bilaterally Abdomen: Soft, non tender, bowel sounds present. Extremities: SCDs in place Wound: VAC in place at sternal manubrial joint  Lab Results: CBC: Recent Labs    01/14/19 1630 01/15/19 0521  WBC 1.5* 2.0*  HGB 12.4* 9.4*  HCT 38.3* 28.9*  PLT 43* 48*   BMET:  Recent Labs    01/15/19 0521  NA 135  K 4.4  CL 97*  CO2 28  GLUCOSE 133*  BUN 12  CREATININE 0.98  CALCIUM 9.0    PT/INR:  Lab Results  Component Value Date   INR 1.24 12/24/2018   INR 0.99 12/23/2016   ABG:  INR: Will add last result for INR, ABG once components are confirmed Will add last 4 CBG results once components are confirmed  Assessment/Plan:  1. CV - SR in the 70's. 2.  Pulmonary - On room air. 3. ID-on Vancomycin for MSSA sternal manubrial joint 4. Anemia-H and H 9.4 and 28.9 this am;H and H yesterday 12.4 and 38.3? 5. Pancytopenia-WBC 2000 and platelets 48,000, both slightly increased. He has a history of both Hepatitis and cirrhosis 6. DM-CBGs 193/131/125. On  Metformin 1000 mg bid, Linagliptin 5 mg daily, and Insulin 7. Regarding pain control, on Fentanyl, Oxy, and Ultram PRN. May have to adjust Fentanyl but with history of IV drug abuse reluctant  Lelon Huh [facility] 01/15/2019,7:26 AM [phone]  Patient seen and examined, agree with above  Viviann Spare C. Dorris Fetch, MD [facility] [phone]

## 2019-01-16 LAB — CBC
HCT: 29.5 % — ABNORMAL LOW (ref 39.0–52.0)
Hemoglobin: 9.6 g/dL — ABNORMAL LOW (ref 13.0–17.0)
MCH: 30.6 pg (ref 26.0–34.0)
MCHC: 32.5 g/dL (ref 30.0–36.0)
MCV: 93.9 fL (ref 80.0–100.0)
PLATELETS: 44 10*3/uL — AB (ref 150–400)
RBC: 3.14 MIL/uL — AB (ref 4.22–5.81)
RDW: 15 % (ref 11.5–15.5)
WBC: 1.7 10*3/uL — ABNORMAL LOW (ref 4.0–10.5)
nRBC: 0 % (ref 0.0–0.2)

## 2019-01-16 LAB — GLUCOSE, CAPILLARY
Glucose-Capillary: 146 mg/dL — ABNORMAL HIGH (ref 70–99)
Glucose-Capillary: 159 mg/dL — ABNORMAL HIGH (ref 70–99)
Glucose-Capillary: 127 mg/dL — ABNORMAL HIGH (ref 70–99)
Glucose-Capillary: 88 mg/dL (ref 70–99)

## 2019-01-16 NOTE — Progress Notes (Addendum)
      [address]       [place] [address]             [phone]        2 Days Post-Op Procedure(s) (LRB): DEBRIDEMENT OF STERNOCLAVICULAR JOINT (N/A) APPLICATION OF WOUND VAC (N/A)  Subjective: Patient with pain at wound VAC site.   Objective: Vital signs in last 24 hours: Temp:  [97.6 F (36.4 C)-98.4 F (36.9 C)] 98.1 F (36.7 C) (03/15 0354) Pulse Rate:  [60-83] 76 (03/15 0354) Cardiac Rhythm: Normal sinus rhythm (03/14 2052) Resp:  [7-21] 11 (03/15 0354) BP: (103-121)/(68-85) 110/85 (03/15 0354) SpO2:  [96 %-99 %] 97 % (03/15 0354)   Current Weight  01/04/19 76 kg      Intake/Output from previous day: 03/14 0701 - 03/15 0700 In: 540 [P.O.:440] Out: 1200 [Urine:1100; Drains:100]   Physical Exam:  Cardiovascular: RRR Pulmonary: Clear to auscultation bilaterally Abdomen: Soft, non tender, bowel sounds present. Extremities: SCDs in place Wound: VAC in place at sternal manubrial joint  Lab Results: CBC: Recent Labs    01/14/19 1630 01/15/19 0521  WBC 1.5* 2.0*  HGB 12.4* 9.4*  HCT 38.3* 28.9*  PLT 43* 48*   BMET:  Recent Labs    01/15/19 0521  NA 135  K 4.4  CL 97*  CO2 28  GLUCOSE 133*  BUN 12  CREATININE 0.98  CALCIUM 9.0    PT/INR:  Lab Results  Component Value Date   INR 1.24 12/24/2018   INR 0.99 12/23/2016   ABG:  INR: Will add last result for INR, ABG once components are confirmed Will add last 4 CBG results once components are confirmed  Assessment/Plan:  1. CV - SR in the 70's. 2.  Pulmonary - On room air. 3. ID-on Vancomycin for MSSA sternal manubrial joint 4. Anemia-H and H yesterday 9.4 and 28.9  5. Pancytopenia-WBC 2000 and platelets 48,000, both slightly increased. He has a history of both Hepatitis and cirrhosis. Await this am's CBC results 6. DM-CBGs 138/142/146. On Metformin 1000 mg bid, Linagliptin 5 mg daily, and Insulin 7. Regarding pain control, on Fentanyl, Oxy, and Ultram PRN. May  have to adjust Fentanyl but with history of IV drug abuse reluctant.   Donielle M ZimmermanPA-C 01/16/2019,7:19 AM [phone]  Patient seen and examined, agree with above  Viviann Spare C. Dorris Fetch, MD [facility] [phone]

## 2019-01-17 LAB — GLUCOSE, CAPILLARY
Glucose-Capillary: 121 mg/dL — ABNORMAL HIGH (ref 70–99)
Glucose-Capillary: 127 mg/dL — ABNORMAL HIGH (ref 70–99)
Glucose-Capillary: 104 mg/dL — ABNORMAL HIGH (ref 70–99)
Glucose-Capillary: 159 mg/dL — ABNORMAL HIGH (ref 70–99)

## 2019-01-17 LAB — BASIC METABOLIC PANEL
Anion gap: 7 (ref 5–15)
BUN: 10 mg/dL (ref 6–20)
CO2: 27 mmol/L (ref 22–32)
Calcium: 8.7 mg/dL — ABNORMAL LOW (ref 8.9–10.3)
Chloride: 99 mmol/L (ref 98–111)
Creatinine, Ser: 0.8 mg/dL (ref 0.61–1.24)
GFR calc Af Amer: >60 mL/min (ref >60)
GFR calc non Af Amer: >60 mL/min (ref >60)
Glucose, Bld: 147 mg/dL — ABNORMAL HIGH (ref 70–99)
Potassium: 4.2 mmol/L (ref 3.5–5.1)
SODIUM: 133 mmol/L — AB (ref 135–145)

## 2019-01-17 LAB — VANCOMYCIN, TROUGH: VANCOMYCIN TR: 15 ug/mL (ref 15–20)

## 2019-01-17 LAB — MAGNESIUM: MAGNESIUM: 1.4 mg/dL — AB (ref 1.7–2.4)

## 2019-01-17 LAB — VANCOMYCIN, PEAK: Vancomycin Pk: 33 ug/mL (ref 30–40)

## 2019-01-17 MED ORDER — MAGNESIUM OXIDE 400 (241.3 MG) MG PO TABS
400.0000 mg | ORAL_TABLET | Freq: Two times a day (BID) | ORAL | Status: DC
  Administered 2019-01-17 – 2019-01-21 (×10): 400 mg via ORAL
  Filled 2019-01-17 (×10): qty 1

## 2019-01-17 MED ORDER — VANCOMYCIN HCL 1000 MG IV SOLR
1000.0000 mg | Freq: Two times a day (BID) | INTRAVENOUS | Status: AC
  Administered 2019-01-18 – 2019-01-21 (×8): 1000 mg via INTRAVENOUS
  Filled 2019-01-17 (×8): qty 1000

## 2019-01-17 NOTE — Progress Notes (Signed)
Pharmacy Antibiotic Note  Chase Reed is a 53 y.o. male admitted on 12/23/2018 with SOB and severe chest pain.  Diagnosed with MSSA bacteremia and sternal abscess.  Patient has been on Ancef, but with worsening leukopenia Pharmacy has been consulted to switch antibiotic to vancomycin.  ID still plans to treat through 01/21/19.  Scr today is 0.8 (CrCl >100 mL/min). Afebrile. Vancomycin peak came back at 33, vancomycin trough came back at 15. Calculated AUC came back at 553 (slightly above goal of 400-550).   Plan: Will reduce to 1000 mg IV every 12 hours for calculated AUC of 442 Monitor renal fxn, clinical progress, vanc AUC as indicated  Height: 6' (182.9 cm) Weight: 167 lb 8.8 oz (76 kg)(scale B) IBW/kg (Calculated) : 77.6  Temp (24hrs), Avg:98.1 F (36.7 C), Min:98 F (36.7 C), Max:98.2 F (36.8 C)  Recent Labs  Lab 01/11/19 0353 01/13/19 1111 01/14/19 1630 01/15/19 0521 01/16/19 0742 01/17/19 0742 01/17/19 1241 01/17/19 1930  WBC 2.3* 1.8* 1.5* 2.0* 1.7*  --   --   --   CREATININE 0.94  --   --  0.98  --  0.80  --   --   VANCOTROUGH  --   --   --   --   --   --   --  15  VANCOPEAK  --   --   --   --   --   --  33  --     Estimated Creatinine Clearance: 118.8 mL/min (by C-G formula based on SCr of 0.8 mg/dL).    No Known Allergies  Ancef >> 3/13 Vanc 3/13 >> (3/20)  2/20 BCx - MSSA 2/21 surgical screen - positive MRSA/MSSA 2/21 chest wall abscess - MSSA 2/22  BCx - negative 3/13 sternal tissue -  Sherron Monday, PharmD, BCCCP Clinical Pharmacist  Pager: [phone] Phone: [phone] 01/17/2019, 8:35 PM

## 2019-01-17 NOTE — Progress Notes (Signed)
3 Days Post-Op Procedure(s) (LRB): DEBRIDEMENT OF STERNOCLAVICULAR JOINT (N/A) APPLICATION OF WOUND VAC (N/A) Subjective: Pain under control  Objective: Vital signs in last 24 hours: Temp:  [97.7 F (36.5 C)-98.2 F (36.8 C)] 98.2 F (36.8 C) (03/16 0512) Pulse Rate:  [51-89] 55 (03/16 0513) Cardiac Rhythm: Normal sinus rhythm (03/16 0512) Resp:  [6-17] 17 (03/16 0513) BP: (112-121)/(68-84) 112/83 (03/16 0513) SpO2:  [94 %-99 %] 94 % (03/16 0513)  Hemodynamic parameters for last 24 hours:    Intake/Output from previous day: 03/15 0701 - 03/16 0700 In: 720 [P.O.:720] Out: 1860 [Urine:1850; Drains:10] Intake/Output this shift: No intake/output data recorded.  General appearance: alert and cooperative Heart: regular rate and rhythm, S1, S2 normal, no murmur, click, rub or gallop Lungs: clear to auscultation bilaterally Wound: VAC in place with minimal drainage. The chest wall looks normal with no erythema  Lab Results: Recent Labs    01/15/19 0521 01/16/19 0742  WBC 2.0* 1.7*  HGB 9.4* 9.6*  HCT 28.9* 29.5*  PLT 48* 44*   BMET:  Recent Labs    01/15/19 0521  NA 135  K 4.4  CL 97*  CO2 28  GLUCOSE 133*  BUN 12  CREATININE 0.98  CALCIUM 9.0    PT/INR: No results for input(s): LABPROT, INR in the last 72 hours. ABG    Component Value Date/Time   PHART 7.509 (H) 12/24/2018 0311   HCO3 24.7 12/24/2018 0311   O2SAT 94.5 12/24/2018 0311   CBG (last 3)  Recent Labs    01/16/19 1602 01/16/19 2143 01/17/19 0633  GLUCAP 127* 88 121*    Assessment/Plan: S/P Procedure(s) (LRB): DEBRIDEMENT OF STERNOCLAVICULAR JOINT (N/A) APPLICATION OF WOUND VAC (N/A)  Hemodynamically stable  Remains afebrile.  Leukocytosis with WBC 1.7. Ancef changed to vanc per ID in case that was responsible. Thrombocytopenia: stable. DM: glucose under adequate control. Operative cultures negative so far. Plan VAC change by [facility] nurse today.  LOS: 24 days    Alleen Borne 01/17/2019

## 2019-01-17 NOTE — Progress Notes (Signed)
Lab tech spoke with VAST RN in hallway; she advised that she had to stick pt earlier for labs because staff were having trouble getting blood return from PICC.  VAST RN assessed line; after power flush, good blood return noted. Flushed PICC with 49mL NS and continued fluids as previously running. Line flushed with slight resistance. Reported to unit RN, Isabel Caprice and asked her to pass info along during report. Also advised if line stops drawing back blood again, TPA might be needed.

## 2019-01-17 NOTE — Consult Note (Signed)
WOC Nurse wound consult note Reason for Consult: change wound vac dressing Wound type: surgical Pressure Injury POA: no Measurement: 8 x 2 x 2.5 cm Wound bed: visible possible bone or tendon 10%, red and pink 90% Drainage (amount, consistency, odor): 250 ml serosanguinous drainage in canister. When previous dressing was removed, serosanguinous wound drainage filled 1/2 of the wound bed in about 2 minutes.  Periwound: intact Dressing procedure/placement/frequency: 1 black sponge, connected to 75 mmhg continuous suction. Change Monday, Wednesday, Friday.  Pt was premedicated with fentanyl per orders, pt tolerated procedure fairly.  Pain eased off within 5 minutes of dressing change.  WOC team will come back on Wednesday to change dressing.  Thank-you,  Iliya Spivack Sylvie Farrier MSN, RN, CWOCN, CWCN-AP, CNS Janna Arch. Dahlia Client, MSN Student [phone]

## 2019-01-17 NOTE — Progress Notes (Signed)
      [address]       [place] [address]             [phone]      3 Days Post-Op Procedure(s) (LRB): DEBRIDEMENT OF STERNOCLAVICULAR JOINT (N/A) APPLICATION OF WOUND VAC (N/A) Subjective: Soreness in wound  Objective: Vital signs in last 24 hours: Temp:  [97.7 F (36.5 C)-98.2 F (36.8 C)] 98.2 F (36.8 C) (03/16 0512) Pulse Rate:  [51-89] 55 (03/16 0513) Cardiac Rhythm: Normal sinus rhythm (03/16 0512) Resp:  [6-17] 17 (03/16 0513) BP: (112-121)/(68-84) 112/83 (03/16 0513) SpO2:  [94 %-99 %] 94 % (03/16 0513)  Hemodynamic parameters for last 24 hours:    Intake/Output from previous day: 03/15 0701 - 03/16 0700 In: 720 [P.O.:720] Out: 1860 [Urine:1850; Drains:10] Intake/Output this shift: No intake/output data recorded.  General appearance: alert, cooperative and no distress Heart: regular rate and rhythm Lungs: clear to auscultation bilaterally Abdomen: benign Extremities: no edema or calf tenderness, feet neuropathy same Wound: VAC without surrounding erethema  Lab Results: Recent Labs    01/15/19 0521 01/16/19 0742  WBC 2.0* 1.7*  HGB 9.4* 9.6*  HCT 28.9* 29.5*  PLT 48* 44*   BMET:  Recent Labs    01/15/19 0521  NA 135  K 4.4  CL 97*  CO2 28  GLUCOSE 133*  BUN 12  CREATININE 0.98  CALCIUM 9.0    PT/INR: No results for input(s): LABPROT, INR in the last 72 hours. ABG    Component Value Date/Time   PHART 7.509 (H) 12/24/2018 0311   HCO3 24.7 12/24/2018 0311   O2SAT 94.5 12/24/2018 0311   CBG (last 3)  Recent Labs    01/16/19 1602 01/16/19 2143 01/17/19 0633  GLUCAP 127* 88 121*    Meds Scheduled Meds: . aspirin EC  81 mg Oral Daily  . DULoxetine  60 mg Oral Daily  . gabapentin  600 mg Oral TID  . Influenza vac split quadrivalent PF  0.5 mL Intramuscular Tomorrow-1000  . insulin aspart  0-15 Units Subcutaneous TID AC & HS  . insulin glargine  15 Units Subcutaneous Daily  . linagliptin  5 mg Oral Daily   . metFORMIN  1,000 mg Oral BID WC  . pneumococcal 23 valent vaccine  0.5 mL Intramuscular Tomorrow-1000  . senna-docusate  1 tablet Oral QHS  . sodium chloride flush  10-40 mL Intracatheter Q12H   Continuous Infusions: . lactated ringers Stopped (01/14/19 1631)  . vancomycin 1,250 mg (01/16/19 2050)   PRN Meds:.fentaNYL (SUBLIMAZE) injection, lactulose, ondansetron (ZOFRAN) IV, oxyCODONE, sodium chloride flush, traMADol, traZODone  Xrays No results found.  Assessment/Plan: S/P Procedure(s) (LRB): DEBRIDEMENT OF STERNOCLAVICULAR JOINT (N/A) APPLICATION OF WOUND VAC (N/A)  1 wound vac change this am 2 on Vancomycin, current plan for same duration 3 Pancytopenia- fairly stable. Consider stopping gabapentin as can cause leukopenia, has pretty severy diabetic neuropathy in feet 4 some PVC's - will recheck K+ and MG++ levels  LOS: 24 days    Rowe Clack [facility] 01/17/2019 Pager [phone]

## 2019-01-18 LAB — CBC
HCT: 28.5 % — ABNORMAL LOW (ref 39.0–52.0)
HEMOGLOBIN: 9.3 g/dL — AB (ref 13.0–17.0)
MCH: 30.6 pg (ref 26.0–34.0)
MCHC: 32.6 g/dL (ref 30.0–36.0)
MCV: 93.8 fL (ref 80.0–100.0)
Platelets: 55 10*3/uL — ABNORMAL LOW (ref 150–400)
RBC: 3.04 MIL/uL — ABNORMAL LOW (ref 4.22–5.81)
RDW: 15.1 % (ref 11.5–15.5)
WBC: 1.8 10*3/uL — ABNORMAL LOW (ref 4.0–10.5)
nRBC: 0 % (ref 0.0–0.2)

## 2019-01-18 LAB — GLUCOSE, CAPILLARY
Glucose-Capillary: 123 mg/dL — ABNORMAL HIGH (ref 70–99)
Glucose-Capillary: 171 mg/dL — ABNORMAL HIGH (ref 70–99)
Glucose-Capillary: 154 mg/dL — ABNORMAL HIGH (ref 70–99)
Glucose-Capillary: 150 mg/dL — ABNORMAL HIGH (ref 70–99)

## 2019-01-18 NOTE — Progress Notes (Addendum)
      [address]       [place] [address]             [phone]      4 Days Post-Op Procedure(s) (LRB): DEBRIDEMENT OF STERNOCLAVICULAR JOINT (N/A) APPLICATION OF WOUND VAC (N/A) Subjective: Sore but overall feels ok  Objective: Vital signs in last 24 hours: Temp:  [98 F (36.7 C)-98.2 F (36.8 C)] 98.1 F (36.7 C) (03/17 0445) Pulse Rate:  [76-79] 78 (03/17 0006) Cardiac Rhythm: Normal sinus rhythm (03/17 0700) Resp:  [18-21] 21 (03/17 0445) BP: (106-114)/(59-81) 106/59 (03/17 0445) SpO2:  [96 %-98 %] 98 % (03/17 0445)  Hemodynamic parameters for last 24 hours:    Intake/Output from previous day: 03/16 0701 - 03/17 0700 In: 250 [P.O.:240; I.V.:10] Out: 1650 [Urine:1250; Drains:400] Intake/Output this shift: Total I/O In: -  Out: 325 [Urine:325]  General appearance: alert, cooperative and no distress Heart: regular rate and rhythm Lungs: clear to auscultation bilaterally Abdomen: benign Extremities: no calf tenderness Wound: VAC in place- no erethema  Lab Results: Recent Labs    01/16/19 0742 01/18/19 0500  WBC 1.7* 1.8*  HGB 9.6* 9.3*  HCT 29.5* 28.5*  PLT 44* 55*   BMET:  Recent Labs    01/17/19 0742  NA 133*  K 4.2  CL 99  CO2 27  GLUCOSE 147*  BUN 10  CREATININE 0.80  CALCIUM 8.7*    PT/INR: No results for input(s): LABPROT, INR in the last 72 hours. ABG    Component Value Date/Time   PHART 7.509 (H) 12/24/2018 0311   HCO3 24.7 12/24/2018 0311   O2SAT 94.5 12/24/2018 0311   CBG (last 3)  Recent Labs    01/17/19 1716 01/17/19 2130 01/18/19 0621  GLUCAP 104* 159* 123*    Meds Scheduled Meds: . aspirin EC  81 mg Oral Daily  . DULoxetine  60 mg Oral Daily  . gabapentin  600 mg Oral TID  . Influenza vac split quadrivalent PF  0.5 mL Intramuscular Tomorrow-1000  . insulin aspart  0-15 Units Subcutaneous TID AC & HS  . insulin glargine  15 Units Subcutaneous Daily  . linagliptin  5 mg Oral Daily  .  magnesium oxide  400 mg Oral BID  . metFORMIN  1,000 mg Oral BID WC  . pneumococcal 23 valent vaccine  0.5 mL Intramuscular Tomorrow-1000  . senna-docusate  1 tablet Oral QHS  . sodium chloride flush  10-40 mL Intracatheter Q12H   Continuous Infusions: . lactated ringers Stopped (01/14/19 1631)  . vancomycin     PRN Meds:.fentaNYL (SUBLIMAZE) injection, lactulose, ondansetron (ZOFRAN) IV, oxyCODONE, sodium chloride flush, traMADol, traZODone  Xrays No results found.  Assessment/Plan: S/P Procedure(s) (LRB): DEBRIDEMENT OF STERNOCLAVICULAR JOINT (N/A) APPLICATION OF WOUND VAC (N/A)  1 stable with improving wound , continue current management and RX 2 thrombocytopenia and leukopenia stable with improving trend   LOS: 25 days    Rowe Clack [facility] 01/18/2019 Pager [phone]   Chart reviewed, patient examined, agree with above. Continue VAC dressing changes MWF.

## 2019-01-19 LAB — AEROBIC/ANAEROBIC CULTURE W GRAM STAIN (SURGICAL/DEEP WOUND): Culture: NO GROWTH

## 2019-01-19 LAB — GLUCOSE, CAPILLARY
Glucose-Capillary: 116 mg/dL — ABNORMAL HIGH (ref 70–99)
Glucose-Capillary: 113 mg/dL — ABNORMAL HIGH (ref 70–99)
Glucose-Capillary: 179 mg/dL — ABNORMAL HIGH (ref 70–99)

## 2019-01-19 LAB — AEROBIC/ANAEROBIC CULTURE (SURGICAL/DEEP WOUND)

## 2019-01-19 NOTE — Progress Notes (Addendum)
      [address]       [place] [address]             [phone]      5 Days Post-Op Procedure(s) (LRB): DEBRIDEMENT OF STERNOCLAVICULAR JOINT (N/A) APPLICATION OF WOUND VAC (N/A) Subjective: No changes, mod soreness  Objective: Vital signs in last 24 hours: Temp:  [97.7 F (36.5 C)-98.2 F (36.8 C)] 97.9 F (36.6 C) (03/18 0416) Pulse Rate:  [69-81] 69 (03/18 0416) Cardiac Rhythm: Normal sinus rhythm (03/17 1900) Resp:  [10-15] 10 (03/18 0416) BP: (116-120)/(78-82) 116/78 (03/17 2300) SpO2:  [98 %-100 %] 98 % (03/18 0416)  Hemodynamic parameters for last 24 hours:    Intake/Output from previous day: 03/17 0701 - 03/18 0700 In: 970 [P.O.:720; IV Piggyback:250] Out: 2175 [Urine:2175] Intake/Output this shift: No intake/output data recorded.  General appearance: alert, cooperative and no distress Heart: regular rate and rhythm Lungs: mildly dim in right base Abdomen: benign Extremities: feet neuropathy, no calf tenderness Wound: VAC stable in appearance  Lab Results: Recent Labs    01/16/19 0742 01/18/19 0500  WBC 1.7* 1.8*  HGB 9.6* 9.3*  HCT 29.5* 28.5*  PLT 44* 55*   BMET:  Recent Labs    01/17/19 0742  NA 133*  K 4.2  CL 99  CO2 27  GLUCOSE 147*  BUN 10  CREATININE 0.80  CALCIUM 8.7*    PT/INR: No results for input(s): LABPROT, INR in the last 72 hours. ABG    Component Value Date/Time   PHART 7.509 (H) 12/24/2018 0311   HCO3 24.7 12/24/2018 0311   O2SAT 94.5 12/24/2018 0311   CBG (last 3)  Recent Labs    01/18/19 1611 01/18/19 2123 01/19/19 0639  GLUCAP 154* 150* 116*    Meds Scheduled Meds: . aspirin EC  81 mg Oral Daily  . DULoxetine  60 mg Oral Daily  . gabapentin  600 mg Oral TID  . Influenza vac split quadrivalent PF  0.5 mL Intramuscular Tomorrow-1000  . insulin aspart  0-15 Units Subcutaneous TID AC & HS  . insulin glargine  15 Units Subcutaneous Daily  . linagliptin  5 mg Oral Daily  . magnesium  oxide  400 mg Oral BID  . metFORMIN  1,000 mg Oral BID WC  . pneumococcal 23 valent vaccine  0.5 mL Intramuscular Tomorrow-1000  . senna-docusate  1 tablet Oral QHS  . sodium chloride flush  10-40 mL Intracatheter Q12H   Continuous Infusions: . lactated ringers Stopped (01/14/19 1631)  . vancomycin 1,000 mg (01/18/19 2039)   PRN Meds:.fentaNYL (SUBLIMAZE) injection, lactulose, ondansetron (ZOFRAN) IV, oxyCODONE, sodium chloride flush, traMADol, traZODone  Xrays No results found.  Assessment/Plan: S/P Procedure(s) (LRB): DEBRIDEMENT OF STERNOCLAVICULAR JOINT (N/A) APPLICATION OF WOUND VAC (N/A)  1 status quo, conts current management, For VAC change this am 2 BS adeq control 3 hemodyn stable in sinus rhythm  LOS: 26 days    Rowe Clack [facility] 01/19/2019 Pager [phone]   Chart reviewed, patient examined, agree with above. Had VAC change this am which went well. Pain under control.  Will need to clarify antibiotic course with ID. Initially was until 3/20.

## 2019-01-19 NOTE — Consult Note (Addendum)
WOC Nurse wound consult note Reason for Consult: change wound vac dressing Wound type: surgical full thickness  Wound bed: 10% possible bone, 90% red Drainage mod amt serosanguinous drainage in canister, no odor or active bleeding.  Periwound: intact Dressing procedure/placement/frequency: 1 black sponge, connected to 75 mmhg continuous suction.  Pt was premedicated for pain and tolerated with mod amt discomfort. WOC will plan to change dressing Q M/W/F at 10:00 as requested by patient. Cammie Mcgee MSN, RN, CWOCN, [place], CNS [phone]

## 2019-01-20 LAB — GLUCOSE, CAPILLARY
Glucose-Capillary: 115 mg/dL — ABNORMAL HIGH (ref 70–99)
Glucose-Capillary: 127 mg/dL — ABNORMAL HIGH (ref 70–99)
Glucose-Capillary: 153 mg/dL — ABNORMAL HIGH (ref 70–99)
Glucose-Capillary: 222 mg/dL — ABNORMAL HIGH (ref 70–99)
Glucose-Capillary: 94 mg/dL (ref 70–99)

## 2019-01-20 NOTE — Progress Notes (Addendum)
[address]       [place] [address]             [phone]      6 Days Post-Op Procedure(s) (LRB): DEBRIDEMENT OF STERNOCLAVICULAR JOINT (N/A) APPLICATION OF WOUND VAC (N/A) Subjective: More sore today, feels bone "popping"  Objective: Vital signs in last 24 hours: Temp:  [97 F (36.1 C)-97.8 F (36.6 C)] 97 F (36.1 C) (03/19 0519) Pulse Rate:  [55-82] 71 (03/19 0519) Cardiac Rhythm: Normal sinus rhythm (03/19 0519) Resp:  [10-19] 12 (03/19 0519) BP: (109-123)/(72-87) 117/77 (03/19 0519) SpO2:  [94 %-99 %] 98 % (03/19 0519)  Hemodynamic parameters for last 24 hours:    Intake/Output from previous day: 03/18 0701 - 03/19 0700 In: 480 [P.O.:480] Out: 1000 [Urine:1000] Intake/Output this shift: No intake/output data recorded.  General appearance: alert, cooperative and no distress Heart: regular rate and rhythm Lungs: clear to auscultation bilaterally Abdomen: benign Extremities: benign Wound: VAC in place, finfings noted from wound care nurse  Lab Results: Recent Labs    01/18/19 0500  WBC 1.8*  HGB 9.3*  HCT 28.5*  PLT 55*   BMET:  Recent Labs    01/17/19 0742  NA 133*  K 4.2  CL 99  CO2 27  GLUCOSE 147*  BUN 10  CREATININE 0.80  CALCIUM 8.7*    PT/INR: No results for input(s): LABPROT, INR in the last 72 hours. ABG    Component Value Date/Time   PHART 7.509 (H) 12/24/2018 0311   HCO3 24.7 12/24/2018 0311   O2SAT 94.5 12/24/2018 0311   CBG (last 3)  Recent Labs    01/19/19 1107 01/19/19 1600 01/20/19 0652  GLUCAP 113* 179* 127*    Meds Scheduled Meds: . aspirin EC  81 mg Oral Daily  . DULoxetine  60 mg Oral Daily  . gabapentin  600 mg Oral TID  . Influenza vac split quadrivalent PF  0.5 mL Intramuscular Tomorrow-1000  . insulin aspart  0-15 Units Subcutaneous TID AC & HS  . insulin glargine  15 Units Subcutaneous Daily  . linagliptin  5 mg Oral Daily  . magnesium oxide  400 mg Oral BID  . metFORMIN   1,000 mg Oral BID WC  . pneumococcal 23 valent vaccine  0.5 mL Intramuscular Tomorrow-1000  . senna-docusate  1 tablet Oral QHS  . sodium chloride flush  10-40 mL Intracatheter Q12H   Continuous Infusions: . lactated ringers Stopped (01/14/19 1631)  . vancomycin 1,000 mg (01/19/19 2100)   PRN Meds:.fentaNYL (SUBLIMAZE) injection, lactulose, ondansetron (ZOFRAN) IV, oxyCODONE, sodium chloride flush, traMADol, traZODone  Xrays No results found.  Assessment/Plan: S/P Procedure(s) (LRB): DEBRIDEMENT OF STERNOCLAVICULAR JOINT (N/A) APPLICATION OF WOUND VAC (N/A)  1 doing well on current management 2 ? If plan to stop Vanco tomorrow continues to be same- will try to arrange home VAC management 3 BS adeq control 4 home when all agree   LOS: 27 days    Rowe Clack [facility] 01/20/2019 Pager [phone]   Chart reviewed, patient examined, agree with above. Antibiotic course to be one tomorrow. His operative cultures were negative. We need to be sure that his pain can be managed adequately on oral oxy IR only without fentanyl before he goes home. He says he has not taken any fentanyl today and on MAR last dose was yesterday am. I would give him another day to be sure that oral pain med is adequate especially with VAC change  tomorrow.

## 2019-01-20 NOTE — Discharge Instructions (Signed)
Negative Pressure Wound Therapy Dressing Care  Home health nursing to do  Negative pressure wound therapy (NPWT) is a device that helps wounds heal. NPWT helps the wound stay clean and healthy while it heals from the inside. NPWT uses a bandage (dressing) that is made of a sponge or gauze-like material. The dressing is placed in or inside the wound. The wound is then covered and sealed with a cover dressing that sticks to your skin (adhesive). This keeps air out. A tube connects the cover dressing to a small pump. The pump sucks fluid and germs from the wound. The pump also controls any odor coming from the wound. What are the risks? NPWT is usually safe to use. The most common problem is skin irritation from the dressing adhesive, but there are many ways to prevent this from happening. However, more serious problems can develop, such as:  Bleeding.  Infection.  Dehydration.  Pain. How to change your dressing How often you change your dressing depends on your wound. If the pump is off for more than two hours, the dressing will need to be changed. Follow your health care providers instructions on how often to change it. Your health care provider may change your dressing, or a family member, friend, or caregiver may be shown how to change the dressing. It is important to:  Wear gloves and protective clothing while changing a dressing. This may include eye protection.  Never let anyone change your dressing if he or she has an infection, skin condition, or skin wound or cut of any size. Preparing to change your dressing  If needed, take pain medicine 30 minutes before the dressing change as prescribed by your health care provider.  Set up a clean station for wound care. You will need: ? A disposable garbage bag that is open and ready to use. ? Hand sanitizer. ? Wound cleanser or saltwater solution (saline) as told by your health care provider. ? New dressing material or bandages. Make sure  to open the dressing package so that the dressing remains on the inside of the package. You may also need the following in your clean station:  A box of vinyl gloves.  Tape.  Skin protectant. This may be a wipe, film, or spray.  Clean or germ-free (sterile) scissors.  Wound liner.  Cotton tip applicators. Removing your old dressing  Wash your hands with soap and water. Dry your hands with a clean towel. If soap and water are not available, use hand sanitizer.  Put on gloves.  Turn off the pump and disconnect the tubing from the dressing.  Carefully remove the adhesive cover dressing in the direction of your hair growth. Only touch the outside edges of the dressing.  Remove the dressing that is inside the wound. If the dressing sticks, use a wound cleanser or saline solution to wet the dressing. This helps it come off more easily.  Throw the old dressing supplies into the ready garbage bag.  Remove your gloves by grabbing the cuff and turning the glove inside out. Place the gloves in the trash immediately.  Wash your hands with soap and water. Dry your hands with a clean towel. If soap and water are not available, use hand sanitizer. Cleaning your wound  Follow your health care provider's instructions on how to clean your wound. This may include using a saline or recommended wound cleanser.  Do not use over-the-counter medicated or antiseptic creams, sprays, liquids, or dressings unless told to do so  by your health care provider.  Clean the area thoroughly with the recommended saline solution or wound cleanser and a clean gauze pad.  Throw the gauze pad into the garbage bag.  Wash your hands with soap and water. Dry your hands with a clean towel. If soap and water are not available, use hand sanitizer. Applying the dressing  Apply a skin protectant to any skin that will be exposed to adhesive. Let the skin protectant dry.  Put a new dressing into the wound.  Apply a new  cover dressing and tube.  Take off your gloves. Put them in the plastic bag with the old dressing. Tie the bag shut and throw it away.  Wash your hands with soap and water. Dry your hands with a clean towel. If soap and water are not available, use hand sanitizer.  Attach the suction and turn the pump back on. Do not change the settings on the machine without talking to a health care provider.  Replace the container in the pump that collects fluid if it is full. Do this at least once a week. Contact a health care provider if:  You have new pain.  You develop irritation, a rash, or itching around the wound or dressing.  You see new black or yellow tissue in your wound.  The dressing changes are painful or cause bleeding.  The pump has been off for more than two hours and you do not know how to change the dressing.  The alarm for the pump goes off and you do not know what to do. Get help right away if:  You have a lot of bleeding.  You see a sudden change in the color or texture of the drainage.  The wound breaks open.  You have severe pain.  You have signs of infection, such as: ? More redness, swelling, or pain. ? More fluid or blood. ? Warmth. ? Pus or a bad smell. ? Red streaks leading from wound. ? A fever. This information is not intended to replace advice given to you by your health care provider. Make sure you discuss any questions you have with your health care provider. Document Released: 01/12/2012 Document Revised: 11/15/2015 Document Reviewed: 07/26/2015 Elsevier Interactive Patient Education  2019 [employer].

## 2019-01-20 NOTE — Progress Notes (Signed)
Pharmacy Antibiotic Note  Chase Reed is a 53 y.o. 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  Pharmacy has been consulted for Vancomycin dosing.  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 S/p OR on 3/13 for debridement. Got TEE which was negative. Switched to vanc 3/14 d/t worsening leukopenia per ID (possibly due to ancef). +vac -WBC= 1.8, afeb, Scr 0.8  2/21 ancef>> 3/13 3/13 vanc>>  3/13: Manubrium tissue>> 2/21 chest abscess- staph aureus (pan-S) 2/22: BC: negative 2/20: BC x 2: MSSA 2/21: MRSA PCR: +  3/16: VP 33, VT 15; AUC came back at 553 (T1/2 6 hr, ke 0.1157). Will reduce slightly to 1000 mg IV every 12 hours for AUC of 442. KH   Plan: Vanc reduced to 1000mg  IV q 12 on 3/16 Treatment to end on 3/20 currently per ID     Height: 6' (182.9 cm) Weight: 167 lb 8.8 oz (76 kg)(scale B) IBW/kg (Calculated) : 77.6  Temp (24hrs), Avg:97.5 F (36.4 C), Min:97 F (36.1 C), Max:97.8 F (36.6 C)  Recent Labs  Lab 01/13/19 1111 01/14/19 1630 01/15/19 0521 01/16/19 0742 01/17/19 0742 01/17/19 1241 01/17/19 1930 01/18/19 0500  WBC 1.8* 1.5* 2.0* 1.7*  --   --   --  1.8*  CREATININE  --   --  0.98  --  0.80  --   --   --   VANCOTROUGH  --   --   --   --   --   --  15  --   VANCOPEAK  --   --   --   --   --  33  --   --     Estimated Creatinine Clearance: 118.8 mL/min (by C-G formula based on SCr of 0.8 mg/dL).    No Known Allergies   Chase Reed, PharmD, BCPS Clinical Staff Pharmacist [facility] 01/20/2019 8:58 AM

## 2019-01-20 NOTE — TOC Initial Note (Signed)
Transition of Care [facility]) - Initial/Assessment Note    Patient Details  Name: Chase Reed MRN: [id number] Date of Birth: 09/28/1966  Transition of Care [facility]) CM/SW Contact:    Lawerance Sabal, RN Phone Number: 01/20/2019, 11:24 AM  Clinical Narrative:                   Patient accepted by [facility] for [facility] RN.  Faxed VAC order to [facility] rep Glenna Fellows [phone], fax [phone]. Anticipate delivery of VAC to hospital on Friday 3/20.   Patient will DC with PO abx. Has Medicaid coverage.  He lives at home with a roommate. May need assistance with transport home.    Expected Discharge Plan: Home w Home Health Services Barriers to Discharge: Continued Medical Work up   Patient Goals and CMS Choice        Expected Discharge Plan and Services Expected Discharge Plan: Home w Home Health Services Discharge Planning Services: CM Consult Post Acute Care Choice: Durable Medical Equipment, Home Health                   DME Arranged: Vac DME Agency: [facility] HH Arranged: RN HH Agency: [facility]  Prior Living Arrangements/Services   Lives with:: Friends                   Activities of Daily Living Home Assistive Devices/Equipment: Eyeglasses, Medical laboratory scientific officer (specify quad or straight), CBG Meter ADL Screening (condition at time of admission) Patient's cognitive ability adequate to safely complete daily activities?: Yes Is the patient deaf or have difficulty hearing?: Yes Does the patient have difficulty seeing, even when wearing glasses/contacts?: Yes Does the patient have difficulty concentrating, remembering, or making decisions?: No Patient able to express need for assistance with ADLs?: Yes Does the patient have difficulty dressing or bathing?: No Independently performs ADLs?: Yes (appropriate for developmental age) Does the patient have difficulty walking or climbing stairs?: No Weakness of Legs: None Weakness of Arms/Hands: None  Permission Sought/Granted                   Emotional Assessment       Orientation: : Oriented to Self, Oriented to Place, Oriented to  Time, Oriented to Situation Alcohol / Substance Use: Illicit Drugs    Admission diagnosis:  Abscess of sternal region [L02.213] Mediastinal abscess [facility]) [J85.3] Patient Active Problem List   Diagnosis Date Noted  . Chest wall abscess 12/24/2018  . MSSA bacteremia 12/24/2018  . Cirrhosis (HCC) 12/24/2018  . Suicidal ideation   . Opiate withdrawal (HCC) 12/21/2016  . Tinea cruris 12/21/2016  . Substance induced mood disorder (HCC) 12/21/2016  . Polysubstance (including opioids) dependence w/o physiol dependence (HCC) 05/02/2016  . 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 Elevated lipase 05/02/2016   PCP:  Courtney Heys, PA-C Pharmacy:   [facility] [place], [place] - [address] [place] [address] Phone: [phone] Fax: [phone]     Social Determinants of Health (SDOH) Interventions    Readmission Risk Interventions 30 Day Unplanned Readmission Risk Score     ED to Hosp-Admission (Current) from 12/23/2018 in [facility] 4E CV SURGICAL PROGRESSIVE CARE  30 Day Unplanned Readmission Risk Score (%)  23 Filed at 01/20/2019 0801     This score is the patient's risk of an unplanned readmission within 30 days of being discharged (0 -100%). The score is based on dignosis, age, lab  data, medications, orders, and past utilization.   Low:  0-14.9   Medium: 15-21.9   High: 22-29.9   Extreme: 30 and above       No flowsheet data found.

## 2019-01-21 LAB — GLUCOSE, CAPILLARY
Glucose-Capillary: 141 mg/dL — ABNORMAL HIGH (ref 70–99)
Glucose-Capillary: 116 mg/dL — ABNORMAL HIGH (ref 70–99)
Glucose-Capillary: 179 mg/dL — ABNORMAL HIGH (ref 70–99)
Glucose-Capillary: 132 mg/dL — ABNORMAL HIGH (ref 70–99)

## 2019-01-21 NOTE — Progress Notes (Signed)
ID Progress Note:   Chase Reed is a 53 y.o. male with MSSA bacteremia in the setting of complex sternal abscess. He was not found to have vegetations on TEE. Treated with 4 weeks IV cefazolin >> vancomycin (changed 3/13 d/t worsening pancytopenia) with last dose due today march 20th.   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 Will address outpatient and determine next steps with therapy.   Rexene Alberts, MSN, NP-C [facility]  [email or web address] Pager: [phone] Office: [phone]

## 2019-01-21 NOTE — Progress Notes (Signed)
[facility] home wound vac delivered to room. CM made aware  Leonidas Romberg, RN

## 2019-01-21 NOTE — Progress Notes (Addendum)
      [address]       [place] [address]             [phone]      7 Days Post-Op Procedure(s) (LRB): DEBRIDEMENT OF STERNOCLAVICULAR JOINT (N/A) APPLICATION OF WOUND VAC (N/A) Subjective: Feels ok, still using more pain meds than we can discharge on  Objective: Vital signs in last 24 hours: Temp:  [97.9 F (36.6 C)-98.3 F (36.8 C)] 98.3 F (36.8 C) (03/19 2338) Pulse Rate:  [71-80] 80 (03/19 2338) Cardiac Rhythm: Normal sinus rhythm (03/19 1900) Resp:  [13-18] 16 (03/19 2338) BP: (113-122)/(74-84) 122/84 (03/19 2338) SpO2:  [98 %-99 %] 99 % (03/19 2338)  Hemodynamic parameters for last 24 hours:    Intake/Output from previous day: 03/19 0701 - 03/20 0700 In: 712 [P.O.:462; IV Piggyback:250] Out: 2025 [Urine:2025] Intake/Output this shift: No intake/output data recorded.  General appearance: alert, cooperative and no distress Heart: regular rate and rhythm Lungs: clear to auscultation bilaterally Abdomen: benign Extremities: no calf tenderness Wound: VAC in place,  no changes  Lab Results: No results for input(s): WBC, HGB, HCT, PLT in the last 72 hours. BMET: No results for input(s): NA, K, CL, CO2, GLUCOSE, BUN, CREATININE, CALCIUM in the last 72 hours.  PT/INR: No results for input(s): LABPROT, INR in the last 72 hours. ABG    Component Value Date/Time   PHART 7.509 (H) 12/24/2018 0311   HCO3 24.7 12/24/2018 0311   O2SAT 94.5 12/24/2018 0311   CBG (last 3)  Recent Labs    01/20/19 1707 01/20/19 2126 01/21/19 0642  GLUCAP 222* 94 141*    Meds Scheduled Meds: . aspirin EC  81 mg Oral Daily  . DULoxetine  60 mg Oral Daily  . gabapentin  600 mg Oral TID  . Influenza vac split quadrivalent PF  0.5 mL Intramuscular Tomorrow-1000  . insulin aspart  0-15 Units Subcutaneous TID AC & HS  . insulin glargine  15 Units Subcutaneous Daily  . linagliptin  5 mg Oral Daily  . magnesium oxide  400 mg Oral BID  . metFORMIN  1,000 mg  Oral BID WC  . pneumococcal 23 valent vaccine  0.5 mL Intramuscular Tomorrow-1000  . senna-docusate  1 tablet Oral QHS  . sodium chloride flush  10-40 mL Intracatheter Q12H   Continuous Infusions: . lactated ringers Stopped (01/14/19 1631)  . vancomycin Stopped (01/20/19 2111)   PRN Meds:.fentaNYL (SUBLIMAZE) injection, lactulose, ondansetron (ZOFRAN) IV, oxyCODONE, sodium chloride flush, traMADol, traZODone  Xrays No results found.  Assessment/Plan: S/P Procedure(s) (LRB): DEBRIDEMENT OF STERNOCLAVICULAR JOINT (N/A) APPLICATION OF WOUND VAC (N/A)  1 stable 2 abx stop today, VAC change today 3 will see how he does with pain meds today, have instructed to limit to q 6 hours , OXY only. He plans to get back on suboxone and is trying to arrange with former clinic as soon as is possible   Chart reviewed, patient examined, agree with above. Home when he is comfortable on pain meds at longer interval.     LOS: 28 days    Rowe Clack [facility] 01/21/2019 Pager [phone]

## 2019-01-21 NOTE — Consult Note (Signed)
WOC Consult  WOC Nurse wound consult note Reason for Consult:change wound vac dressing Wound type:surgical full thickness  Wound bed:10% possible bone, 90% red Measurement:right side of wound is less deep than 3 days ago. Upper wound edge is rolling inward, it was pulled back as much as possible and wound vac inserted in an effort to keep top edge from rolling further under. Drainage mod amt serosanguinous drainage in canister, no odor or active bleeding.  Periwound:intact Dressing procedure/placement/frequency:1 black sponge, connected to 75 mmhg continuous suction.  Pt was NOT premedicated for pain and tolerated WELL with mod amt discomfort.   Janna Arch. Dahlia Client, MSN Student Cammie Mcgee MSN, RN, CWOCN, [place], CNS [phone]

## 2019-01-22 LAB — GLUCOSE, CAPILLARY
Glucose-Capillary: 143 mg/dL — ABNORMAL HIGH (ref 70–99)
Glucose-Capillary: 113 mg/dL — ABNORMAL HIGH (ref 70–99)
Glucose-Capillary: 177 mg/dL — ABNORMAL HIGH (ref 70–99)
Glucose-Capillary: 124 mg/dL — ABNORMAL HIGH (ref 70–99)
Glucose-Capillary: 152 mg/dL — ABNORMAL HIGH (ref 70–99)

## 2019-01-22 NOTE — Progress Notes (Signed)
      [address]       [place] [address]             [phone]      8 Days Post-Op Procedure(s) (LRB): DEBRIDEMENT OF STERNOCLAVICULAR JOINT (N/A) APPLICATION OF WOUND VAC (N/A) Subjective: Trying to stretch pain meds out, improving to q 4-5 hours, no premed prior to wound vac change- did ok  Objective: Vital signs in last 24 hours: Temp:  [97.5 F (36.4 C)-98.1 F (36.7 C)] 97.5 F (36.4 C) (03/21 0800) Pulse Rate:  [80-83] 82 (03/21 0800) Cardiac Rhythm: Normal sinus rhythm (03/20 1900) Resp:  [10-18] 16 (03/21 0800) BP: (118-127)/(76-82) 122/79 (03/21 0800) SpO2:  [97 %-99 %] 97 % (03/21 0800)  Hemodynamic parameters for last 24 hours:    Intake/Output from previous day: 03/20 0701 - 03/21 0700 In: 1192 [P.O.:1182; I.V.:10] Out: 2075 [Urine:2050; Drains:25] Intake/Output this shift: No intake/output data recorded.  General appearance: alert, cooperative and no distress Heart: regular rate and rhythm Lungs: clear to auscultation bilaterally Abdomen: benign exam Extremities: no calf tenderness or edema Wound: VAC site-ok  Lab Results: No results for input(s): WBC, HGB, HCT, PLT in the last 72 hours. BMET: No results for input(s): NA, K, CL, CO2, GLUCOSE, BUN, CREATININE, CALCIUM in the last 72 hours.  PT/INR: No results for input(s): LABPROT, INR in the last 72 hours. ABG    Component Value Date/Time   PHART 7.509 (H) 12/24/2018 0311   HCO3 24.7 12/24/2018 0311   O2SAT 94.5 12/24/2018 0311   CBG (last 3)  Recent Labs    01/21/19 1708 01/21/19 2145 01/22/19 0633  GLUCAP 179* 132* 143*    Meds Scheduled Meds: . aspirin EC  81 mg Oral Daily  . DULoxetine  60 mg Oral Daily  . gabapentin  600 mg Oral TID  . Influenza vac split quadrivalent PF  0.5 mL Intramuscular Tomorrow-1000  . insulin aspart  0-15 Units Subcutaneous TID AC & HS  . insulin glargine  15 Units Subcutaneous Daily  . linagliptin  5 mg Oral Daily  . magnesium  oxide  400 mg Oral BID  . metFORMIN  1,000 mg Oral BID WC  . pneumococcal 23 valent vaccine  0.5 mL Intramuscular Tomorrow-1000  . senna-docusate  1 tablet Oral QHS  . sodium chloride flush  10-40 mL Intracatheter Q12H   Continuous Infusions: . lactated ringers Stopped (01/14/19 1631)   PRN Meds:.fentaNYL (SUBLIMAZE) injection, lactulose, ondansetron (ZOFRAN) IV, oxyCODONE, sodium chloride flush, traMADol, traZODone  Xrays No results found.  Assessment/Plan: S/P Procedure(s) (LRB): DEBRIDEMENT OF STERNOCLAVICULAR JOINT (N/A) APPLICATION OF WOUND VAC (N/A)  1 conts to work on pain management- improving and otherwise remains quite stable on current RX/TX   LOS: 29 days    Rowe Clack PA-C 01/22/2019 Pager [phone]

## 2019-01-22 NOTE — Plan of Care (Signed)
Care plans reviewed and patient is progressing.  

## 2019-01-23 LAB — GLUCOSE, CAPILLARY
Glucose-Capillary: 173 mg/dL — ABNORMAL HIGH (ref 70–99)
Glucose-Capillary: 148 mg/dL — ABNORMAL HIGH (ref 70–99)
Glucose-Capillary: 145 mg/dL — ABNORMAL HIGH (ref 70–99)
Glucose-Capillary: 157 mg/dL — ABNORMAL HIGH (ref 70–99)

## 2019-01-23 NOTE — Progress Notes (Addendum)
      [address]       [place] [address]             [phone]      9 Days Post-Op Procedure(s) (LRB): DEBRIDEMENT OF STERNOCLAVICULAR JOINT (N/A) APPLICATION OF WOUND VAC (N/A) Subjective: "lot of pain " this am, did better withspreading out pain meds yesterday  Objective: Vital signs in last 24 hours: Temp:  [97.6 F (36.4 C)-98.1 F (36.7 C)] 97.6 F (36.4 C) (03/22 0800) Pulse Rate:  [61-75] 71 (03/22 0800) Cardiac Rhythm: Normal sinus rhythm (03/22 0700) Resp:  [10-16] 10 (03/22 0431) BP: (103-125)/(67-92) 103/67 (03/22 0800) SpO2:  [95 %-97 %] 97 % (03/22 0800)  Hemodynamic parameters for last 24 hours:    Intake/Output from previous day: 03/21 0701 - 03/22 0700 In: 1040 [P.O.:1040] Out: 2675 [Urine:2650; Drains:25] Intake/Output this shift: Total I/O In: -  Out: 450 [Urine:450]  Wound: VAC in place, appears stable  Lab Results: No results for input(s): WBC, HGB, HCT, PLT in the last 72 hours. BMET: No results for input(s): NA, K, CL, CO2, GLUCOSE, BUN, CREATININE, CALCIUM in the last 72 hours.  PT/INR: No results for input(s): LABPROT, INR in the last 72 hours. ABG    Component Value Date/Time   PHART 7.509 (H) 12/24/2018 0311   HCO3 24.7 12/24/2018 0311   O2SAT 94.5 12/24/2018 0311   CBG (last 3)  Recent Labs    01/22/19 2044 01/22/19 2254 01/23/19 0605  GLUCAP 124* 152* 173*    Meds Scheduled Meds: . aspirin EC  81 mg Oral Daily  . DULoxetine  60 mg Oral Daily  . gabapentin  600 mg Oral TID  . Influenza vac split quadrivalent PF  0.5 mL Intramuscular Tomorrow-1000  . insulin aspart  0-15 Units Subcutaneous TID AC & HS  . insulin glargine  15 Units Subcutaneous Daily  . linagliptin  5 mg Oral Daily  . metFORMIN  1,000 mg Oral BID WC  . pneumococcal 23 valent vaccine  0.5 mL Intramuscular Tomorrow-1000  . senna-docusate  1 tablet Oral QHS  . sodium chloride flush  10-40 mL Intracatheter Q12H   Continuous Infusions:  . lactated ringers Stopped (01/14/19 1631)   PRN Meds:.fentaNYL (SUBLIMAZE) injection, lactulose, ondansetron (ZOFRAN) IV, oxyCODONE, sodium chloride flush, traMADol, traZODone  Xrays No results found.  Assessment/Plan: S/P Procedure(s) (LRB): DEBRIDEMENT OF STERNOCLAVICULAR JOINT (N/A) APPLICATION OF WOUND VAC (N/A)  1 stable, conts wound care and pain management  LOS: 30 days    Rowe Clack 01/23/2019  Patient counseled to reduce pain meds to tid patient examined and medical record reviewed,agree with above note. Kathlee Nations Trigt III 01/23/2019

## 2019-01-23 NOTE — Plan of Care (Signed)
Care plans reviewed and patient is progressing.  

## 2019-01-24 LAB — GLUCOSE, CAPILLARY
Glucose-Capillary: 109 mg/dL — ABNORMAL HIGH (ref 70–99)
Glucose-Capillary: 144 mg/dL — ABNORMAL HIGH (ref 70–99)
Glucose-Capillary: 167 mg/dL — ABNORMAL HIGH (ref 70–99)
Glucose-Capillary: 72 mg/dL (ref 70–99)
Glucose-Capillary: 84 mg/dL (ref 70–99)

## 2019-01-24 LAB — MRSA PCR SCREENING: MRSA by PCR: POSITIVE — AB

## 2019-01-24 NOTE — Progress Notes (Signed)
Patient given 15 mg oxy IR at 2131 for pain 7/10 in chest. Patient given next dose (15mg  oxy IR) at 0430 for pain 8/10. Patient informed that he was able to go 7 hours between doses. Patient in discomfort but glad he was able to last at least 6 hours between doses. Will continue to monitor. Victorino December, RN

## 2019-01-24 NOTE — Consult Note (Signed)
WOC Nurse wound follow up Wound type:surgical S/P debridement  Measurement:2cm x 8cm x 2cm  Wound BTY:OMAYO, ruddy in deepest point, bone present right distal aspect of the wound bed.  Drainage (amount, consistency, odor) see nursing flow sheet for canister drainage. No active drainage at the time of the dressing change. No odor  Periwound: intact  Dressing procedure/placement/frequency: Removed old NPWT dressing Filled wound with  __1____ piece of black foam. Sealed NPWT dressing at Patient received PO pain medication per bedside nurse prior to dressing change Patient tolerated procedure well  WOC nurse will continue to change NPWT dressing M/W/F. Explained to the patient procedure if hooked to home unit for DC prior to next dressing change. Will plan to change dressing again on Wednesday if patient still inpatient.   [facility], CNS, [employer] [phone]

## 2019-01-24 NOTE — Progress Notes (Addendum)
10 Days Post-Op Procedure(s) (LRB): DEBRIDEMENT OF STERNOCLAVICULAR JOINT (N/A) APPLICATION OF WOUND VAC (N/A) Subjective: Trying to extend interval between pain medication. Said he went 7 hours between the last 2 doses and "it was brutal".  Otherwise no new problems.   Objective: Vital signs in last 24 hours: Temp:  [97.3 F (36.3 C)-98 F (36.7 C)] 97.3 F (36.3 C) (03/23 0437) Pulse Rate:  [68-90] 78 (03/23 0437) Cardiac Rhythm: Normal sinus rhythm (03/22 2136) Resp:  [10-18] 10 (03/23 0437) BP: (101-131)/(67-88) 110/72 (03/23 0437) SpO2:  [95 %-99 %] 97 % (03/23 0437)     Intake/Output from previous day: 03/22 0701 - 03/23 0700 In: 610 [P.O.:600; I.V.:10] Out: 1675 [Urine:1675] Intake/Output this shift: No intake/output data recorded.  Heart: RRR, SR on monitor. Chest: wound in place functioning appropriately.   Lab Results: No results for input(s): WBC, HGB, HCT, PLT in the last 72 hours. BMET: No results for input(s): NA, K, CL, CO2, GLUCOSE, BUN, CREATININE, CALCIUM in the last 72 hours.  PT/INR: No results for input(s): LABPROT, INR in the last 72 hours. ABG    Component Value Date/Time   PHART 7.509 (H) 12/24/2018 0311   HCO3 24.7 12/24/2018 0311   O2SAT 94.5 12/24/2018 0311   CBG (last 3)  Recent Labs    01/23/19 1624 01/23/19 2051 01/24/19 0608  GLUCAP 145* 157* 109*    Assessment/Plan: S/P Procedure(s) (LRB): DEBRIDEMENT OF STERNOCLAVICULAR JOINT (N/A) APPLICATION OF WOUND VAC (N/A)  -POD31 debridement of chest wall abscess and placement of wound vac.  He has completed a full course of IV Ancef for MSSA as recommended by ID. For vac change today.  Wound vac could be managed at home but he is nervous about having adequate main control in the outpatient setting.  He is planning to get back on suboxone after discharge.   -Diabetes mellitus- Glucose <180 past 24 hours.   LOS: 31 days    Parke Poisson [phone] 01/24/2019  Chart  reviewed, patient examined, agree with above. Had VAC changed today with oral meds and did ok. I think he can go home tomorrow with [facility] doing VAC changed MWF. I will need to see him in the office in two weeks to check the wound.

## 2019-01-25 LAB — GLUCOSE, CAPILLARY
Glucose-Capillary: 209 mg/dL — ABNORMAL HIGH (ref 70–99)
Glucose-Capillary: 116 mg/dL — ABNORMAL HIGH (ref 70–99)

## 2019-01-25 MED ORDER — ASPIRIN 81 MG PO TBEC: 81.0000 mg | DELAYED_RELEASE_TABLET | Freq: Every day | ORAL | Status: DC

## 2019-01-25 MED ORDER — OXYCODONE HCL 15 MG PO TABS: 15.0000 mg | ORAL_TABLET | Freq: Four times a day (QID) | ORAL | 0 refills | Status: DC | PRN

## 2019-01-25 NOTE — Progress Notes (Addendum)
      [address]       [place] [address]             [phone]      11 Days Post-Op Procedure(s) (LRB): DEBRIDEMENT OF STERNOCLAVICULAR JOINT (N/A) APPLICATION OF WOUND VAC (N/A) Subjective: conts to improve with spacing out of pain meds  Objective: Vital signs in last 24 hours: Temp:  [97.7 F (36.5 C)-98.4 F (36.9 C)] 98.4 F (36.9 C) (03/23 2311) Pulse Rate:  [76-90] 76 (03/23 2311) Cardiac Rhythm: Normal sinus rhythm (03/24 0410) Resp:  [12-14] 14 (03/23 2311) BP: (123-140)/(80-99) 123/87 (03/23 2311) SpO2:  [96 %-99 %] 96 % (03/23 2311)  Hemodynamic parameters for last 24 hours:    Intake/Output from previous day: 03/23 0701 - 03/24 0700 In: 840 [P.O.:840] Out: 650 [Urine:650] Intake/Output this shift: No intake/output data recorded.  General appearance: alert, cooperative and no distress Wound: VAC in place, appears stable  Lab Results: No results for input(s): WBC, HGB, HCT, PLT in the last 72 hours. BMET: No results for input(s): NA, K, CL, CO2, GLUCOSE, BUN, CREATININE, CALCIUM in the last 72 hours.  PT/INR: No results for input(s): LABPROT, INR in the last 72 hours. ABG    Component Value Date/Time   PHART 7.509 (H) 12/24/2018 0311   HCO3 24.7 12/24/2018 0311   O2SAT 94.5 12/24/2018 0311   CBG (last 3)  Recent Labs    01/24/19 1752 01/24/19 2129 01/25/19 0629  GLUCAP 72 84 209*    Meds Scheduled Meds: . aspirin EC  81 mg Oral Daily  . DULoxetine  60 mg Oral Daily  . gabapentin  600 mg Oral TID  . Influenza vac split quadrivalent PF  0.5 mL Intramuscular Tomorrow-1000  . insulin aspart  0-15 Units Subcutaneous TID AC & HS  . insulin glargine  15 Units Subcutaneous Daily  . linagliptin  5 mg Oral Daily  . metFORMIN  1,000 mg Oral BID WC  . pneumococcal 23 valent vaccine  0.5 mL Intramuscular Tomorrow-1000  . senna-docusate  1 tablet Oral QHS  . sodium chloride flush  10-40 mL Intracatheter Q12H   Continuous  Infusions: . lactated ringers Stopped (01/14/19 1631)   PRN Meds:.fentaNYL (SUBLIMAZE) injection, lactulose, ondansetron (ZOFRAN) IV, oxyCODONE, sodium chloride flush, traMADol, traZODone  Xrays No results found.  Assessment/Plan: S/P Procedure(s) (LRB): DEBRIDEMENT OF STERNOCLAVICULAR JOINT (N/A) APPLICATION OF WOUND VAC (N/A)  1 doing well overall, stable for discharge   LOS: 32 days    Rowe Clack [facility] 01/25/2019 Pager [phone]   Chart reviewed, patient examined, agree with above. He can go home whenever it can be arranged so that he can have VAC changes at home.

## 2019-01-25 NOTE — Care Management Note (Signed)
Case Management Note Donn Pierini RN, BSN Transitions of Care Unit 4E- RN Case Manager [phone]  Patient Details  Name: Chase Reed MRN: [id number] Date of Birth: 10-25-1966  Subjective/Objective:   Pt admitted with chest wall abscess s/p I&D x2 with wound VAC placement, ID consulted for IV abx recommendations.                 Action/Plan: PTA pt lived at home, hx IVDU, per ID initial consult  Pt would not be a candidate for PICC line and home IV abx due to IVDU history. Most likely will need IV abx 4-6 wks. Per CVTS they also agree with ID that pt will not be a home abx candidate- will need to stay in house for duration of treatment as he is not a SNF candidate either due to history. CM will follow for transition of care needs.  Expected Discharge Date:  01/25/19               Expected Discharge Plan:  Home w Home Health Services  In-House Referral:  Clinical Social Work  Discharge planning Services  CM Consult  Post Acute Care Choice:  Durable Medical Equipment, Home Health Choice offered to:  Patient  DME Arranged:  Vac DME Agency:  [facility]  HH Arranged:  RN HH Agency:  [facility] ([facility]  Status of Service:  Completed, signed off  If discussed at Long Length of Stay Meetings, dates discussed:    Discharge Disposition: home with home health   Additional Comments:  01/25/19- 1230- Pt has completed his IV abx treatment ending non 3/20 per ID recommendations. Pt had another I&D on 3/13 with wound VAC re-placed at that time. Pt will have home wound VAC- per CM notes last week Pt has [facility] home wound vac arranged- and has been delivered to room for transition home. Referral has also been made to [facility] for [facility] needs for wound VAC drsg changes- CM has notified Lupita Leash with [facility] for pt discharge today and start of care on 01/26/19.   Darrold Span, RN 01/25/2019, 12:37 PM

## 2019-01-26 ENCOUNTER — Telehealth (HOSPITAL_COMMUNITY): Payer: Self-pay | Admitting: Surgical

## 2019-01-26 ENCOUNTER — Ambulatory Visit: Payer: Self-pay | Admitting: Surgery

## 2019-01-26 DIAGNOSIS — L02213 Cutaneous abscess of chest wall: Secondary | ICD-10-CM

## 2019-01-26 DIAGNOSIS — Z4801 Encounter for change or removal of surgical wound dressing: Secondary | ICD-10-CM | POA: Diagnosis not present

## 2019-01-27 ENCOUNTER — Telehealth: Payer: Self-pay

## 2019-01-27 NOTE — Telephone Encounter (Signed)
Verbal orders given to [facility] for [facility] wound vac changes Mon/Wed/Friday's x 8-10 weeks. / Marcelino Duster RN- [phone]

## 2019-01-31 ENCOUNTER — Other Ambulatory Visit: Payer: Self-pay

## 2019-01-31 NOTE — Telephone Encounter (Signed)
Patient was notified that request for refill was sent to Dr Laneta Simmers to review

## 2019-02-01 ENCOUNTER — Other Ambulatory Visit (HOSPITAL_COMMUNITY): Payer: Self-pay | Admitting: Surgical

## 2019-02-01 MED ORDER — OXYCODONE HCL 5 MG PO TABS: 5.0000 mg | ORAL_TABLET | Freq: Four times a day (QID) | ORAL | 0 refills | Status: DC | PRN

## 2019-02-01 MED ORDER — OXYCODONE HCL 15 MG PO TABS: 15.0000 mg | ORAL_TABLET | Freq: Four times a day (QID) | ORAL | 0 refills | Status: DC | PRN

## 2019-02-03 ENCOUNTER — Telehealth: Payer: Self-pay | Admitting: *Deleted

## 2019-02-03 ENCOUNTER — Other Ambulatory Visit: Payer: Self-pay

## 2019-02-03 ENCOUNTER — Ambulatory Visit (INDEPENDENT_AMBULATORY_CARE_PROVIDER_SITE_OTHER): Payer: Medicaid Other | Admitting: Infectious Diseases

## 2019-02-03 ENCOUNTER — Encounter: Payer: Self-pay | Admitting: Infectious Diseases

## 2019-02-03 DIAGNOSIS — K7469 Other cirrhosis of liver: Secondary | ICD-10-CM

## 2019-02-03 DIAGNOSIS — B182 Chronic viral hepatitis C: Secondary | ICD-10-CM

## 2019-02-03 DIAGNOSIS — L02213 Cutaneous abscess of chest wall: Secondary | ICD-10-CM | POA: Diagnosis not present

## 2019-02-03 DIAGNOSIS — D61818 Other pancytopenia: Secondary | ICD-10-CM | POA: Diagnosis not present

## 2019-02-03 DIAGNOSIS — R7881 Bacteremia: Secondary | ICD-10-CM

## 2019-02-03 DIAGNOSIS — M869 Osteomyelitis, unspecified: Secondary | ICD-10-CM | POA: Diagnosis not present

## 2019-02-03 MED ORDER — DOXYCYCLINE HYCLATE 100 MG PO TABS: 100.0000 mg | ORAL_TABLET | Freq: Two times a day (BID) | ORAL | 0 refills | Status: AC

## 2019-02-03 NOTE — Assessment & Plan Note (Signed)
He has wound VAC therapy that is changed multiple times per week from home health team.  Follow-up appointment with Dr. Laneta Simmers is scheduled on 15 April.  He was aware of this appointment and the details surrounding and plans to attend.  It sounds like his wound is been slow to heal since his most recent debridement on 13 March.  Resume antibiotics as indicated below.

## 2019-02-03 NOTE — Telephone Encounter (Signed)
RN relayed verbal order per Stephanie to Kerrin at [facility] to add labs at next visit CRP/ESR/CMET/CBC with diff. Per Kerrin, patient's next nurse visit is 4/3. Labs will be drawn, results faxed to [phone]. ,  M, RN  

## 2019-02-03 NOTE — Assessment & Plan Note (Signed)
I informed him today that during inpatient admission we assessed for reinfection.  He was found to have positive hepatitis C viral load with genotype 1a.  He was upset to hear that he has either failed previous therapy or more likely reinfected himself.  I reviewed documents from [facility] and it appeared he had several negative RNAs indicating he was previously cured successfully with Harvoni.  I told him we will address this in time.  With reinfection and signs of cirrhosis he needs an abdominal ultrasound and possibly a liver MRI.

## 2019-02-03 NOTE — Assessment & Plan Note (Addendum)
I reviewed his lab work with him over the phone today.  He has had neutropenia.  This was partially thought to be due to beta-lactam exposure.  We will repeat CBC through home health team.  Considering his pancytopenia I suspect this is more due to the cirrhosis process.

## 2019-02-03 NOTE — Assessment & Plan Note (Signed)
Chase Reed received 4 weeks of intravenous antibiotics for MSSA bacteremia and sternal abscess.  The last time that our team saw him in the hospital we did not know about his osteomyelitis.  This was realized later on CT scan.  Fortunately March 13 he underwent further debridement of the bone and at that time nothing grew on cultures.  With pain and slow wound healing I will resume doxycycline 100 mg twice a day for partially treated MSSA osteomyelitis.  We will plan on 1 month and then reevaluate.  Keflex would be preferred however considering questionable neutropenia due to beta-lactam will go with alternative..  Home health will check serum CRP, sed rate, CBC, cmet.

## 2019-02-03 NOTE — Assessment & Plan Note (Signed)
Subjectively does not any symptoms concerning for relapsing bacteremia.

## 2019-02-03 NOTE — Assessment & Plan Note (Signed)
We will need repeat liver ultrasound and possibly a MRI for HCC screening.  We will check AFP with next lab draw.

## 2019-02-03 NOTE — Progress Notes (Signed)
Name: Chase Reed  EHO:[id number]   DOB: 12/20/1965   PCP: Courtney Heys, PA-C   Virtual Visit via Telephone Note  I connected with Chase Reed on 02/03/19 at  1:45 PM EDT by telephone and verified that I am speaking with the correct person using two identifiers.   I discussed the limitations, risks, security and privacy concerns of performing an evaluation and management service by telephone and the availability of in person appointments. I also discussed with the patient that there may be a patient responsible charge related to this service. The patient expressed understanding and agreed to proceed.  She has a nurse that changes the wound vac a few times a week. Has had to do this himself a few times d/t leaking and such and supplements with wet to dry dressing. He reports that it is healing up very quickly.   01/10/2019 - CT scan of the chest revealed some residual osteomyelitis. He underwent a final debridement on 3/13 where he had bone   Chief Complaint  Patient presents with  . EVISIT    unable to get to [facility], dealing with depression; pain comes/goes, 1-10.     History of Present Illness: Chase Reed is doing okay, he feels like he was released from the hospital too soon with consideration of his pain control.  He admits that this was his decision to leave at the time he did but regrets it.  His pain varies anywhere from 1-2 up to 15 out of 10.  A lot of his pain seems to be centered over the left breast sternum where his wound VAC sits. Feels that it is stabbing pain. He has not been able to sleep comfortably over the last few days due to pain.  He has noticed an increase since discharge from the hospital as he now is responsible for self-care and household chores.  Feels that increased movement has caused this to worsen.  He denies any significant drainage mostly bloody in appearance.  Nothing ever in the [facility] canister.  He has no fevers or chills.  He is not currently on  antibiotics.  He stays with a 51 year old male roommate that he provides most care for.   He would like to discuss his white blood cell count as he hears it is been low.  He tells me he was previously cured from hepatitis C infection before 2016 through [facility].  He has had several relapses with intravenous drug use prior to admission and is really trying to abstain now at home.  Considering reaching out to previous methadone provider to reengage in care.  Medical/surgical/social/family history have been updated during today's visit.     Observations/Objective: Chase Reed is having a hard time with pain management at home and sounds quite uncomfortable.  His thoughts are kind of scattered likely due to some sleep deprivation and pain.  Lab Results  Component Value Date   CREATININE 0.80 01/17/2019   CREATININE 0.98 01/15/2019   CREATININE 0.94 01/11/2019    Lab Results  Component Value Date   WBC 1.8 (L) 01/18/2019   HGB 9.3 (L) 01/18/2019   HCT 28.5 (L) 01/18/2019   MCV 93.8 01/18/2019   PLT 55 (L) 01/18/2019    Lab Results  Component Value Date   ALT 9 12/26/2018   AST 15 12/26/2018   ALKPHOS 63 12/26/2018   BILITOT 0.5 12/26/2018    Assessment and Plan: Problem List Items Addressed This Visit  High   Chest wall abscess    He has wound VAC therapy that is changed multiple times per week from home health team.  Follow-up appointment with Dr. Laneta Simmers is scheduled on 15 April.  He was aware of this appointment and the details surrounding and plans to attend.  It sounds like his wound is been slow to heal since his most recent debridement on 13 March.  Resume antibiotics as indicated below.        Unprioritized   MSSA bacteremia    Subjectively does not any symptoms concerning for relapsing bacteremia.      Cirrhosis (HCC)    We will need repeat liver ultrasound and possibly a MRI for HCC screening.  We will check AFP with next lab draw.       Pancytopenia  (HCC)    I reviewed his lab work with him over the phone today.  He has had neutropenia.  This was partially thought to be due to beta-lactam exposure.  We will repeat CBC through home health team.  Considering his pancytopenia I suspect this is more due to the cirrhosis process.      Chronic viral hepatitis C (HCC)    I informed him today that during inpatient admission we assessed for reinfection.  He was found to have positive hepatitis C viral load with genotype 1a.  He was upset to hear that he has either failed previous therapy or more likely reinfected himself.  I reviewed documents from [facility] and it appeared he had several negative RNAs indicating he was previously cured successfully with Harvoni.  I told him we will address this in time.  With reinfection and signs of cirrhosis he needs an abdominal ultrasound and possibly a liver MRI.      Sternal osteomyelitis (HCC)    Cadyn received 4 weeks of intravenous antibiotics for MSSA bacteremia and sternal abscess.  The last time that our team saw him in the hospital we did not know about his osteomyelitis.  This was realized later on CT scan.  Fortunately March 13 he underwent further debridement of the bone and at that time nothing grew on cultures.  With pain and slow wound healing I will resume doxycycline 100 mg twice a day for partially treated MSSA osteomyelitis.  We will plan on 1 month and then reevaluate.  Keflex would be preferred however considering questionable neutropenia due to beta-lactam will go with alternative..  Home health will check serum CRP, sed rate, CBC, cmet.          Follow Up Instructions: Provided information about appointment details for Dr. Laneta Simmers follow-up on April 15.   I discussed the assessment and treatment plan with the patient. The patient was provided an opportunity to ask questions and all were answered. The patient agreed with the plan and demonstrated an understanding of the instructions.   The  patient was advised to call back or seek an in-person evaluation if the symptoms worsen or if the condition fails to improve as anticipated.  I provided 25 minutes of non-face-to-face time during this encounter.   Rexene Alberts, MSN, NP-C [facility] for Infectious Disease [facility]  [email or web address] Pager: [phone] Office: [phone] [facility] Main Line: [phone]

## 2019-02-08 ENCOUNTER — Other Ambulatory Visit (HOSPITAL_COMMUNITY): Payer: Self-pay | Admitting: Surgical

## 2019-02-08 MED ORDER — OXYCODONE HCL 5 MG PO TABS: 5.0000 mg | ORAL_TABLET | Freq: Four times a day (QID) | ORAL | 0 refills | Status: AC | PRN

## 2019-02-08 NOTE — Progress Notes (Signed)
      [address]       [place] [address]             [phone]     Refilled oxycodone with reduced dose, 5 mg q 6 prn#28              Will need office visit prior to any further refills with Dr Laneta Simmers   Rowe Clack, PA-C

## 2019-02-10 ENCOUNTER — Other Ambulatory Visit: Payer: Self-pay

## 2019-02-10 ENCOUNTER — Other Ambulatory Visit: Payer: Self-pay | Admitting: Surgery

## 2019-02-10 DIAGNOSIS — L02213 Cutaneous abscess of chest wall: Secondary | ICD-10-CM

## 2019-02-14 ENCOUNTER — Encounter: Payer: Self-pay | Admitting: Surgery

## 2019-02-14 ENCOUNTER — Other Ambulatory Visit: Payer: Self-pay

## 2019-02-14 ENCOUNTER — Ambulatory Visit
Admission: RE | Admit: 2019-02-14 | Discharge: 2019-02-14 | Disposition: A | Discharge status: Home / Self Care | Payer: Medicaid Other | Source: Ambulatory Visit | Attending: Surgery | Admitting: Surgery

## 2019-02-14 ENCOUNTER — Ambulatory Visit (INDEPENDENT_AMBULATORY_CARE_PROVIDER_SITE_OTHER): Payer: Self-pay | Admitting: Surgery

## 2019-02-14 VITALS — BP 133/93 | HR 97 | Temp 97.1°F | Resp 16 | Ht 72.0 in | Wt 186.2 lb

## 2019-02-14 DIAGNOSIS — L02213 Cutaneous abscess of chest wall: Secondary | ICD-10-CM

## 2019-02-14 DIAGNOSIS — B9561 Methicillin susceptible Staphylococcus aureus infection as the cause of diseases classified elsewhere: Secondary | ICD-10-CM

## 2019-02-14 DIAGNOSIS — R7881 Bacteremia: Secondary | ICD-10-CM

## 2019-02-14 DIAGNOSIS — Z09 Encounter for follow-up examination after completed treatment for conditions other than malignant neoplasm: Secondary | ICD-10-CM

## 2019-02-14 NOTE — Progress Notes (Signed)
HPI: Patient returns for routine postoperative follow-up having undergone incision and drainage of a large abscess of the right anterior chest wall extending down into the anterior mediastinum on 12/27/2018 and subsequent return to the operating room on 01/14/2023 for further debridement of osteomyelitis of the sternomanubrial joint. The patient's early postoperative recovery while in the hospital was notable for difficulty with pain control.  He was treated with a wound VAC on both occasions and discharged home with wound VAC therapy being changed 3 times a week by home health nursing. Since hospital discharge the patient reports that he has had no fever or chills.  He was started on doxycycline for 1 month and was recently evaluated by infectious disease on 02/03/2019.  He does report some left anterior chest wall pain which she had during his hospitalization.  He also noted marked swelling in both lower extremities last week of unknown etiology which has improved somewhat with elevation of his legs.  He is still having significant pain with wound VAC changes and said that he took all of the oxycodone that we prescribed at his last visit within 48 hours.  He has been getting his own pain medication off the street and said that he does not need any further pain medication from our office.  He denies any further intravenous drug use.   Current Outpatient Medications  Medication Sig Dispense Refill  . aspirin EC 81 MG EC tablet Take 1 tablet (81 mg total) by mouth daily.    Marland Kitchen doxycycline (VIBRA-TABS) 100 MG tablet Take 1 tablet (100 mg total) by mouth 2 (two) times daily for 30 days. 60 tablet 0  . DULoxetine (CYMBALTA) 60 MG capsule Take 60 mg by mouth daily.    Marland Kitchen gabapentin (NEURONTIN) 600 MG tablet take 1 tablet by mouth THREE TIMES DAILY    . insulin aspart (NOVOLOG) 100 UNIT/ML injection Inject 2-8 Units into the skin See admin instructions. Sliding scale.over 250--2 units over 300--4 units  over 350--6 units over 400--8 units Up to 50 units daily: For diabetes management 10 mL 0  . insulin glargine (LANTUS) 100 UNIT/ML injection Inject 30 Units into the skin daily.     . metFORMIN (GLUCOPHAGE) 1000 MG tablet Take 1,000 mg by mouth 2 (two) times daily.    Marland Kitchen oxyCODONE (OXY IR/ROXICODONE) 5 MG immediate release tablet Take 1 tablet (5 mg total) by mouth every 6 (six) hours as needed for up to 7 days. 28 tablet 0  . sitaGLIPtin (JANUVIA) 100 MG tablet Take 100 mg by mouth daily.    . traZODone (DESYREL) 50 MG tablet Take 1 tablet (50 mg total) by mouth at bedtime as needed for sleep. (Patient taking differently: Take 100 mg by mouth at bedtime as needed for sleep. ) 30 tablet 0  . SUBOXONE 8-2 MG FILM      No current facility-administered medications for this visit.     Physical Exam: BP (!) 133/93 (BP Location: Right Arm, Patient Position: Sitting, Cuff Size: Large)   Pulse 97   Temp (!) 97.1 F (36.2 C)   Resp 16   Ht 6' (1.829 m)   Wt 186 lb 3.2 oz (84.5 kg)   SpO2 97% Comment: ON RA  BMI 25.25 kg/m  He is in no distress.  The wound VAC was just changed this morning by the home health care nurse but we remove the sponge to examine the wound.  There is no erythema or swelling around the  wound.  The wound is closing in nicely but there is a deep crevice.  I do not think a wound VAC is effective at this time.  His chest wall otherwise is unremarkable.  He does have mild bilateral lower extremity edema.  Diagnostic Tests: None today  Impression:  Overall I think the wound is doing well at this time.  He has no signs of sepsis and there is no erythema or swelling around the wound.  I do not think a wound VAC is being effective at this time and to be best to switch to normal saline wet-to-dry dressings daily.  Hopefully discontinuing the wound VAC will improve his pain.  He said that he is not interested in any further pain medicine from our office and we will take care of it  himself.  I cautioned him against taking pain medication off the street.  It is not clear to me why he developed swelling in his lower extremities but it was bilateral and has improved with elevation so I think DVT would be less likely.  He did have a TEE in the hospital which showed normal left ventricular systolic function and no significant valvular disease.  If this recurs he is going to discuss it with his primary physician.  Plan:  We will start once daily wet-to-dry dressings by the home health care nurse.  I will plan to see him back in 1 week to reexamine the wound.   Alleen BorneBryan K Bartle, MD [facility] [phone]

## 2019-02-15 ENCOUNTER — Other Ambulatory Visit: Payer: Self-pay | Admitting: Surgical

## 2019-02-16 ENCOUNTER — Ambulatory Visit: Payer: Self-pay | Admitting: Surgery

## 2019-02-21 NOTE — Telephone Encounter (Signed)
No additional notes

## 2019-02-22 ENCOUNTER — Other Ambulatory Visit: Payer: Self-pay

## 2019-02-23 ENCOUNTER — Encounter: Payer: Self-pay | Admitting: Surgery

## 2019-02-28 ENCOUNTER — Encounter: Payer: Self-pay | Admitting: Surgery

## 2019-03-02 ENCOUNTER — Encounter: Payer: Self-pay | Admitting: Surgery

## 2019-03-03 ENCOUNTER — Other Ambulatory Visit: Payer: Self-pay

## 2019-03-04 ENCOUNTER — Ambulatory Visit (INDEPENDENT_AMBULATORY_CARE_PROVIDER_SITE_OTHER): Payer: Self-pay | Admitting: Surgery

## 2019-03-04 ENCOUNTER — Encounter: Payer: Self-pay | Admitting: Surgery

## 2019-03-04 VITALS — BP 124/86 | HR 92 | Temp 97.6°F | Resp 20 | Ht 72.0 in | Wt 187.0 lb

## 2019-03-04 DIAGNOSIS — R7881 Bacteremia: Secondary | ICD-10-CM

## 2019-03-04 DIAGNOSIS — L02213 Cutaneous abscess of chest wall: Secondary | ICD-10-CM

## 2019-03-04 DIAGNOSIS — Z09 Encounter for follow-up examination after completed treatment for conditions other than malignant neoplasm: Secondary | ICD-10-CM

## 2019-03-04 NOTE — Progress Notes (Signed)
     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  Since I last saw him on 02/14/2019 he has been doing daily wet-to-dry dressing changes with normal saline gauze to the wound.  He denies any fever or chills.  He said the pain is manageable.  He has noted no significant drainage from the wound.  He feels like the sternal motion with coughing has resolved.  He is still taking doxycycline per infectious disease recommendation.  Current Outpatient Medications  Medication Sig Dispense Refill  . aspirin EC 81 MG EC tablet Take 1 tablet (81 mg total) by mouth daily.    Marland Kitchen doxycycline (VIBRA-TABS) 100 MG tablet Take 1 tablet (100 mg total) by mouth 2 (two) times daily for 30 days. 60 tablet 0  . DULoxetine (CYMBALTA) 60 MG capsule Take 60 mg by mouth daily.    Marland Kitchen gabapentin (NEURONTIN) 600 MG tablet take 1 tablet by mouth THREE TIMES DAILY    . insulin aspart (NOVOLOG) 100 UNIT/ML injection Inject 2-8 Units into the skin See admin instructions. Sliding scale.over 250--2 units over 300--4 units over 350--6 units over 400--8 units Up to 50 units daily: For diabetes management 10 mL 0  . insulin glargine (LANTUS) 100 UNIT/ML injection Inject 30 Units into the skin daily.     . metFORMIN (GLUCOPHAGE) 1000 MG tablet Take 1,000 mg by mouth 2 (two) times daily.    . sitaGLIPtin (JANUVIA) 100 MG tablet Take 100 mg by mouth daily.    . SUBOXONE 8-2 MG FILM     . traZODone (DESYREL) 50 MG tablet Take 1 tablet (50 mg total) by mouth at bedtime as needed for sleep. (Patient taking differently: Take 100 mg by mouth at bedtime as needed for sleep. ) 30 tablet 0   No current facility-administered medications for this visit.      Physical Exam: BP 124/86   Pulse 92   Temp 97.6 F (36.4 C) (Skin)   Resp 20   Ht 6' (1.829 m)   Wt 187 lb (84.8 kg)   SpO2 99% Comment: RA  BMI 25.36 kg/m  He looks well. The chest wound is clean and granulating with no significant drainage. There is no erythema or tenderness of the chest wall.  Diagnostic Tests: None today  Impression:  The wound appears clean and granulating and is slowly closing in.  I recommended that he continue daily wet-to-dry dressing changes with normal saline gauze until closed.  Plan:  I will plan to see him back in 3 weeks for reevaluation of the wound.  I told him that if he has any fever or chills, increased drainage, erythema, or tenderness he should return to see me immediately.   Alleen Borne, MD [facility] [phone]

## 2019-03-24 ENCOUNTER — Other Ambulatory Visit: Payer: Self-pay

## 2019-03-25 ENCOUNTER — Telehealth: Payer: Self-pay | Admitting: Surgery

## 2019-03-25 ENCOUNTER — Encounter: Payer: Medicaid Other | Admitting: Surgery

## 2019-03-25 ENCOUNTER — Telehealth (INDEPENDENT_AMBULATORY_CARE_PROVIDER_SITE_OTHER): Payer: Self-pay | Admitting: Surgery

## 2019-03-25 DIAGNOSIS — M869 Osteomyelitis, unspecified: Secondary | ICD-10-CM

## 2019-03-25 DIAGNOSIS — L02213 Cutaneous abscess of chest wall: Secondary | ICD-10-CM

## 2019-03-25 NOTE — Telephone Encounter (Signed)
[address]       [place] [id number]             [phone]     CARDIOTHORACIC SURGERY TELEPHONE VIRTUAL OFFICE NOTE  Referring Provider is No ref. provider found Primary Cardiologist is No primary care provider on file. PCP is Courtney HeysYates-Davis, Heather D, PA-C   HPI:  I spoke with Chase Reed (DOB 02/09/1966 ) via telephone on 03/25/2019 at 1:13 PM and verified that I was speaking with the correct person using more than one form of identification.  We discussed the reason(s) for conducting our visit virtually instead of in-person.  The patient expressed understanding the circumstances and agreed to proceed as described.   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  Since I last saw him in the office on 03/04/2019 he said that he has been feeling well.  He is still doing wet-to-dry normal saline dressings to the wound.  He said no fever or chills.  He has been taking his antibiotics intermittently and said that he hardly took them at all last week.  He has not had any follow-up with infectious disease since I saw him last.  He sent a picture of his chest wound to our office nurse which I have reviewed.  This appears clean and getting smaller.  There is no erythema or swelling.  Current Outpatient Medications  Medication Sig Dispense Refill  . aspirin EC 81 MG EC tablet Take 1 tablet (81 mg total) by mouth daily.    . DULoxetine (CYMBALTA) 60 MG capsule Take 60 mg by mouth daily.    Marland Kitchen. gabapentin (NEURONTIN) 600 MG tablet take 1 tablet by mouth THREE TIMES DAILY    . insulin aspart (NOVOLOG) 100 UNIT/ML injection Inject 2-8 Units into the skin See  admin instructions. Sliding scale.over 250--2 units over 300--4 units over 350--6 units over 400--8 units Up to 50 units daily: For diabetes management 10 mL 0  . insulin glargine (LANTUS) 100 UNIT/ML injection Inject 30 Units into the skin daily.     . metFORMIN (GLUCOPHAGE) 1000 MG tablet Take 1,000 mg by mouth 2 (two) times daily.    . sitaGLIPtin (JANUVIA) 100 MG tablet Take 100 mg by mouth daily.    . SUBOXONE 8-2 MG FILM     . traZODone (DESYREL) 50 MG tablet Take 1 tablet (50 mg total) by mouth at bedtime as needed for sleep. (Patient taking differently: Take 100 mg by mouth at bedtime as needed for sleep. ) 30 tablet 0   No current facility-administered medications for this visit.      Diagnostic Tests:  None   Impression:  It appears that the chest wound is continuing to heal well.  The visible wound is granulating well and I advised him to continue normal saline wet-to-dry dressing changes until it is healed.  There are no signs of infection around the wound.  I advised him to complete his antibiotics as prescribed by infectious disease.  Also advised him to contact them for follow-up concerning his osteomyelitis as well as his hepatitis C.  Plan:  I will plan to do another virtual visit with him in about  3 weeks to reassess the wound or sooner if he develops any problems.  He is in agreement with that.    Alleen Borne, MD 03/25/2019 1:13 PM

## 2019-04-05 MED FILL — SUBOXONE 8 MG-2 MG SL FILM: 8-2 | 7 days supply | Qty: 21 | Fill #0

## 2019-04-13 ENCOUNTER — Telehealth (INDEPENDENT_AMBULATORY_CARE_PROVIDER_SITE_OTHER): Payer: Self-pay | Admitting: Surgery

## 2019-04-13 ENCOUNTER — Telehealth: Payer: Self-pay | Admitting: Surgery

## 2019-04-13 DIAGNOSIS — M869 Osteomyelitis, unspecified: Secondary | ICD-10-CM

## 2019-04-13 NOTE — Telephone Encounter (Signed)
[address]       [place] [address]             [phone]     CARDIOTHORACIC SURGERY TELEPHONE VIRTUAL OFFICE NOTE  Referring Provider is No ref. provider found Primary Cardiologist is No primary care provider on file. PCP is Reyes Ivan, PA-C   HPI:  I spoke with Chase Reed (DOB [birth year] ) via telephone on 04/13/2019 at 9:05 AM and verified that I was speaking with the correct person using more than one form of identification.  We discussed the reason(s) for conducting our visit virtually instead of in-person.  The patient expressed understanding the circumstances and agreed to proceed as described.   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  He is still doing normal saline wet-to-dry dressings to the open wound.  He said there is still some tan drainage.  He has no fever or chills.  There is no erythema around the wound.  He has not contacted infectious disease yet for follow-up.   Current Outpatient Medications  Medication Sig Dispense Refill  . aspirin EC 81 MG EC tablet Take 1 tablet (81 mg total) by mouth daily.    . DULoxetine (CYMBALTA) 60 MG capsule Take 60 mg by mouth daily.    Marland Kitchen gabapentin (NEURONTIN) 600 MG tablet take 1 tablet by mouth THREE TIMES DAILY    . insulin aspart (NOVOLOG) 100 UNIT/ML injection Inject 2-8 Units into the skin See admin instructions. Sliding scale.over 250--2 units over 300--4 units over 350--6 units over 400--8 units Up to 50 units daily: For diabetes management 10 mL 0  . insulin glargine (LANTUS) 100 UNIT/ML injection Inject 30 Units into the skin daily.     . metFORMIN  (GLUCOPHAGE) 1000 MG tablet Take 1,000 mg by mouth 2 (two) times daily.    . sitaGLIPtin (JANUVIA) 100 MG tablet Take 100 mg by mouth daily.    . SUBOXONE 8-2 MG FILM     . traZODone (DESYREL) 50 MG tablet Take 1 tablet (50 mg total) by mouth at bedtime as needed for sleep. (Patient taking differently: Take 100 mg by mouth at bedtime as needed for sleep. ) 30 tablet 0   No current facility-administered medications for this visit.      Diagnostic Tests:  none   Impression:  He feels like the wound is stable and looks about the same as when I talked to him 3 weeks ago.  There are no signs of worsening infection but has been slow to heal probably due to his diabetes and malnutrition.  I think is best to continue wet-to-dry dressings with normal saline for now to try to get this to healing completely.  He is in agreement with that.  Plan:  He will continue normal saline wet-to-dry dressing changes to the chest wound and I will contact him in about 3 more weeks to see how the wound is progressing.  He understands that if there is any worsening in the appearance, erythema around the wound or swelling, fever or chills he should contact our office for a in person office visit immediately.     Gaye Pollack, MD  04/13/2019 9:05 AM

## 2019-04-14 MED FILL — SUBOXONE 8 MG-2 MG SL FILM: 8-2 | 7 days supply | Qty: 21 | Fill #0

## 2019-04-21 MED FILL — SUBOXONE 8 MG-2 MG SL FILM: 8-2 | 7 days supply | Qty: 21 | Fill #0

## 2019-04-28 MED FILL — SUBOXONE 8 MG-2 MG SL FILM: 8-2 | 14 days supply | Qty: 42 | Fill #0

## 2019-05-11 ENCOUNTER — Other Ambulatory Visit: Payer: Self-pay

## 2019-05-11 ENCOUNTER — Telehealth (INDEPENDENT_AMBULATORY_CARE_PROVIDER_SITE_OTHER): Payer: Medicaid Other | Admitting: Surgery

## 2019-05-11 ENCOUNTER — Telehealth: Payer: Medicaid Other | Admitting: Surgery

## 2019-05-11 DIAGNOSIS — M869 Osteomyelitis, unspecified: Secondary | ICD-10-CM | POA: Diagnosis not present

## 2019-05-11 NOTE — Telephone Encounter (Signed)
      [address]       [place] [address]             [phone]     CARDIOTHORACIC SURGERY TELEPHONE VIRTUAL OFFICE NOTE  Referring Provider is No ref. provider found Primary Cardiologist is No primary care provider on file. PCP is Reyes Ivan, PA-C   HPI:  I spoke with Chase Reed (DOB [birth year] ) via telephone on 05/11/2019 at 2:50 PM and verified that I was speaking with the correct person using more than one form of identification.  We discussed the reason(s) for conducting our visit virtually instead of in-person.  The patient expressed understanding the circumstances and agreed to proceed as described.   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  He said that he is feeling well and the wound appears to be healing.  He is still doing normal saline wet-to-dry dressing changes to the open portion.  He has no fever chills.  There is no erythema around the wound.   Current Outpatient Medications  Medication Sig Dispense Refill  . aspirin EC 81 MG EC tablet Take 1 tablet (81 mg total) by mouth daily.    . DULoxetine (CYMBALTA) 60 MG capsule Take 60 mg by mouth daily.    Marland Kitchen gabapentin (NEURONTIN) 600 MG tablet take 1 tablet by mouth THREE TIMES DAILY    . insulin aspart (NOVOLOG) 100 UNIT/ML injection Inject 2-8 Units into the skin See admin instructions. Sliding scale.over 250--2 units over 300--4 units over 350--6 units over 400--8 units Up to 50 units daily: For diabetes management 10 mL 0  . insulin glargine (LANTUS) 100 UNIT/ML injection Inject 30 Units into the skin daily.     . metFORMIN (GLUCOPHAGE) 1000 MG tablet Take 1,000 mg by mouth 2 (two)  times daily.    . sitaGLIPtin (JANUVIA) 100 MG tablet Take 100 mg by mouth daily.    . SUBOXONE 8-2 MG FILM     . traZODone (DESYREL) 50 MG tablet Take 1 tablet (50 mg total) by mouth at bedtime as needed for sleep. (Patient taking differently: Take 100 mg by mouth at bedtime as needed for sleep. ) 30 tablet 0   No current facility-administered medications for this visit.      Diagnostic Tests:  n/a   Impression:  He says that he feels the wound is stable and appears to be slowly healing in.  I do not think there is any reason to change the current wet-to-dry dressings as long as things are healing and there are no signs of active infection.  I think his wound healing will be slow due to his diabetes and malnutrition.  Plan:  He will continue normal saline wet-to-dry dressing changes to the open wound and will let me know in about 3 weeks how the wound is progressing.  I told him that if there is any worsening in the appearance, development of erythema around the wound or swelling, fever, or chills he should contact our office so that we can bring him in for an office visit.     Fernande Boyden., MD 05/11/2019

## 2019-05-12 MED FILL — CloNIDine HCL 0.1 MG TAB: 0.1 | 7 days supply | Qty: 14 | Fill #0

## 2019-05-12 MED FILL — PROMETHAZINE 25 MG TABLET: 25 | 7 days supply | Qty: 21 | Fill #0

## 2019-05-19 MED FILL — SUBOXONE 8 MG-2 MG SL FILM: 8-2 | 14 days supply | Qty: 42 | Fill #0

## 2019-06-29 ENCOUNTER — Encounter: Payer: Self-pay | Admitting: Surgery

## 2019-09-08 ENCOUNTER — Telehealth: Payer: Self-pay | Admitting: Hematology and Oncology

## 2019-09-08 NOTE — Telephone Encounter (Signed)
A new hem appt has been scheduled for Chase Reed to see Dr. Lorenso Courier on 11/17 at 2pm. Pt's address and insurance has been verified. Pt aware to arrive 15 minutes early.

## 2019-09-20 ENCOUNTER — Other Ambulatory Visit: Payer: Self-pay

## 2019-09-20 ENCOUNTER — Inpatient Hospital Stay: Payer: Medicaid Other | Attending: Hematology and Oncology | Admitting: Hematology and Oncology

## 2019-09-20 ENCOUNTER — Telehealth: Payer: Self-pay

## 2019-09-20 ENCOUNTER — Encounter: Payer: Self-pay | Admitting: Hematology and Oncology

## 2019-09-20 ENCOUNTER — Inpatient Hospital Stay: Payer: Medicaid Other

## 2019-09-20 VITALS — BP 122/87 | HR 93 | Temp 98.5°F | Resp 18 | Ht 72.0 in | Wt 187.3 lb

## 2019-09-20 DIAGNOSIS — K7460 Unspecified cirrhosis of liver: Secondary | ICD-10-CM | POA: Diagnosis not present

## 2019-09-20 DIAGNOSIS — Z7982 Long term (current) use of aspirin: Secondary | ICD-10-CM | POA: Diagnosis not present

## 2019-09-20 DIAGNOSIS — Z79899 Other long term (current) drug therapy: Secondary | ICD-10-CM | POA: Diagnosis not present

## 2019-09-20 DIAGNOSIS — D61818 Other pancytopenia: Secondary | ICD-10-CM | POA: Diagnosis not present

## 2019-09-20 DIAGNOSIS — R161 Splenomegaly, not elsewhere classified: Secondary | ICD-10-CM | POA: Insufficient documentation

## 2019-09-20 DIAGNOSIS — F17210 Nicotine dependence, cigarettes, uncomplicated: Secondary | ICD-10-CM | POA: Insufficient documentation

## 2019-09-20 DIAGNOSIS — D696 Thrombocytopenia, unspecified: Secondary | ICD-10-CM

## 2019-09-20 DIAGNOSIS — F1910 Other psychoactive substance abuse, uncomplicated: Secondary | ICD-10-CM | POA: Diagnosis not present

## 2019-09-20 DIAGNOSIS — E119 Type 2 diabetes mellitus without complications: Secondary | ICD-10-CM | POA: Insufficient documentation

## 2019-09-20 DIAGNOSIS — Z23 Encounter for immunization: Secondary | ICD-10-CM | POA: Diagnosis not present

## 2019-09-20 DIAGNOSIS — Z794 Long term (current) use of insulin: Secondary | ICD-10-CM | POA: Insufficient documentation

## 2019-09-20 DIAGNOSIS — R5383 Other fatigue: Secondary | ICD-10-CM | POA: Insufficient documentation

## 2019-09-20 LAB — CBC WITH DIFFERENTIAL (CANCER CENTER ONLY)
Abs Immature Granulocytes: 0.01 10*3/uL (ref 0.00–0.07)
Basophils Absolute: 0 10*3/uL (ref 0.0–0.1)
Basophils Relative: 0 %
Eosinophils Absolute: 0 10*3/uL (ref 0.0–0.5)
Eosinophils Relative: 1 %
HCT: 34.6 % — ABNORMAL LOW (ref 39.0–52.0)
Hemoglobin: 11.8 g/dL — ABNORMAL LOW (ref 13.0–17.0)
Immature Granulocytes: 0 %
Lymphocytes Relative: 11 %
Lymphs Abs: 0.4 10*3/uL — ABNORMAL LOW (ref 0.7–4.0)
MCH: 29.9 pg (ref 26.0–34.0)
MCHC: 34.1 g/dL (ref 30.0–36.0)
MCV: 87.6 fL (ref 80.0–100.0)
Monocytes Absolute: 0.4 10*3/uL (ref 0.1–1.0)
Monocytes Relative: 10 %
Neutro Abs: 2.6 10*3/uL (ref 1.7–7.7)
Neutrophils Relative %: 78 %
Platelet Count: 35 10*3/uL — ABNORMAL LOW (ref 150–400)
RBC: 3.95 MIL/uL — ABNORMAL LOW (ref 4.22–5.81)
RDW: 13.2 % (ref 11.5–15.5)
WBC Count: 3.4 10*3/uL — ABNORMAL LOW (ref 4.0–10.5)
nRBC: 0 % (ref 0.0–0.2)

## 2019-09-20 LAB — CMP (CANCER CENTER ONLY)
ALT: 34 U/L (ref 0–44)
AST: 32 U/L (ref 15–41)
Albumin: 4.1 g/dL (ref 3.5–5.0)
Alkaline Phosphatase: 93 U/L (ref 38–126)
Anion gap: 12 (ref 5–15)
BUN: 17 mg/dL (ref 6–20)
CO2: 23 mmol/L (ref 22–32)
Calcium: 9.1 mg/dL (ref 8.9–10.3)
Chloride: 100 mmol/L (ref 98–111)
Creatinine: 1.22 mg/dL (ref 0.61–1.24)
GFR, Est AFR Am: >60 mL/min (ref >60)
GFR, Estimated: >60 mL/min (ref >60)
Glucose, Bld: 240 mg/dL — ABNORMAL HIGH (ref 70–99)
Potassium: 4.3 mmol/L (ref 3.5–5.1)
Sodium: 135 mmol/L (ref 135–145)
Total Bilirubin: 0.9 mg/dL (ref 0.3–1.2)
Total Protein: 8.1 g/dL (ref 6.5–8.1)

## 2019-09-20 LAB — RETIC PANEL
Immature Retic Fract: 10 % (ref 2.3–15.9)
RBC.: 3.88 MIL/uL — ABNORMAL LOW (ref 4.22–5.81)
Retic Count, Absolute: 48.1 10*3/uL (ref 19.0–186.0)
Retic Ct Pct: 1.2 % (ref 0.4–3.1)
Reticulocyte Hemoglobin: 31.4 pg (ref >27.9)

## 2019-09-20 LAB — SAVE SMEAR(SSMR), FOR PROVIDER SLIDE REVIEW

## 2019-09-20 LAB — VITAMIN B12: Vitamin B-12: 192 pg/mL (ref 180–914)

## 2019-09-20 LAB — FOLATE: Folate: 9.2 ng/mL (ref >5.9)

## 2019-09-20 MED ORDER — INFLUENZA VAC SPLIT QUAD 0.5 ML IM SUSY: 0.5000 mL | PREFILLED_SYRINGE | Freq: Once | INTRAMUSCULAR | Status: DC

## 2019-09-20 NOTE — Telephone Encounter (Signed)
COVID-19 Pre-Screening Questions:09/20/19    Do you currently have a fever (>100 F), chills or unexplained body aches? NO   . Are you currently experiencing new cough, shortness of breath, sore throat, runny nose?NO .  Have you recently travelled outside the state of [place] in the last 14 days? NO .  Have you been in contact with someone that is currently pending confirmation of Covid19 testing or has been confirmed to have the Covid19 virus? NO  **If the patient answers NO to ALL questions -  advise the patient to please call the clinic before coming to the office should any symptoms develop.     
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
## 2019-09-21 ENCOUNTER — Ambulatory Visit (INDEPENDENT_AMBULATORY_CARE_PROVIDER_SITE_OTHER): Payer: Medicaid Other | Admitting: Internal Medicine

## 2019-09-21 ENCOUNTER — Telehealth: Payer: Self-pay | Admitting: Pharmacy Technician

## 2019-09-21 ENCOUNTER — Encounter: Payer: Self-pay | Admitting: Internal Medicine

## 2019-09-21 VITALS — Ht 72.0 in | Wt 191.0 lb

## 2019-09-21 DIAGNOSIS — B182 Chronic viral hepatitis C: Secondary | ICD-10-CM | POA: Diagnosis present

## 2019-09-21 LAB — IRON AND TIBC
Iron: 56 ug/dL (ref 42–163)
Saturation Ratios: 12 % — ABNORMAL LOW (ref 20–55)
TIBC: 456 ug/dL — ABNORMAL HIGH (ref 202–409)
UIBC: 401 ug/dL — ABNORMAL HIGH (ref 117–376)

## 2019-09-21 LAB — FERRITIN: Ferritin: 42 ng/mL (ref 24–336)

## 2019-09-21 LAB — LACTATE DEHYDROGENASE: LDH: 133 U/L (ref 98–192)

## 2019-09-21 LAB — HOMOCYSTEINE: Homocysteine: 12.6 umol/L (ref 0.0–14.5)

## 2019-09-21 LAB — TSH: TSH: 1.957 u[IU]/mL (ref 0.320–4.118)

## 2019-09-21 NOTE — Telephone Encounter (Signed)
[facility] Patient Advocate Encounter   Have needed readiness to treat form for [facility] Medicaid approval.  Will file once medication is determined.  Chase Reed. [name] [place] Patient [facility] Phone: [phone] Fax:  [phone]

## 2019-09-21 NOTE — Progress Notes (Signed)
[place] for Infectious Disease   CC: thrombocytopenia  HPI:  +Chase Reed is a 53 y.o. male who presents for initial evaluation and management of chronic hepatitis C.  Patient tested positive in March 2020. Hepatitis C-associated risk factors present are: IV drug abuse (details: has been drug free for over 2 months). Patient denies renal dialysis, sexual contact with person with liver disease. Patient has had other studies performed. Results: hepatitis C RNA by PCR, result: positive. Patient has had prior treatment for Hepatitis C. Patient does have a past history of liver disease. Patient does not have a family history of liver disease. Patient does  have associated signs or symptoms related to liver disease.  Labs reviewed and confirm chronic hepatitis C with a positive viral load.  He previously was treated by [facility] [place] about 6 years ago and achieved SVR as noted in the GI note from 10/2//19.  SVR done in February 2017.   He was hepatitis C RNA negative at that time.  AFP of 7.6.  He had a pneumovax in 2016.  He has known cirrhosis and sent here for evaluation of thrombocytopenia.  He saw hematology yesterday for pancytopenia.  WBC from that visit is improved and Hgb is improved.  Platelets remained low at 35.  He has relapsed with drug use but clean again and starting suboxone tomorrow.   Records reviewed from Epic, positive hepatitis C, genotype 1a.       Patient does not have documented immunity to Hepatitis A. Patient does not have documented immunity to Hepatitis B.    Review of Systems:   Constitutional: negative for fevers, chills, fatigue and malaise Cardiovascular: negative for chest pressure/discomfort Integument/breast: negative for rash Musculoskeletal: negative for myalgias and arthralgias All other systems reviewed and are negative       Past Medical History:  Diagnosis Date  . Cirrhosis ([place])   . Diabetes mellitus without complication  ([place])   . Hepatitis     Prior to Admission medications   Medication Sig Start Date End Date Taking? Authorizing Provider  aspirin EC 81 MG EC tablet Take 1 tablet (81 mg total) by mouth daily. 01/25/19  Yes Gold, Wayne E, PA-C  cloNIDine (CATAPRES) 0.1 MG tablet Take 1 tablet by mouth daily. 10/06/18  Yes [provider]  DULoxetine (CYMBALTA) 60 MG capsule Take 60 mg by mouth daily. 09/07/18  Yes [provider]  gabapentin (NEURONTIN) 600 MG tablet take 1 tablet by mouth THREE TIMES DAILY 01/20/19  Yes [provider]  insulin aspart (NOVOLOG) 100 UNIT/ML injection Inject 2-8 Units into the skin See admin instructions. Sliding scale.over 250--2 units over 300--4 units over 350--6 units over 400--8 units Up to 50 units daily: For diabetes management 12/25/16  Yes Lindell Spar I, NP  insulin glargine (LANTUS) 100 UNIT/ML injection Inject 30 Units into the skin daily.    Yes [provider]  lisinopril (ZESTRIL) 20 MG tablet Take 1 tablet by mouth daily. 07/07/19  Yes [provider]  metFORMIN (GLUCOPHAGE) 1000 MG tablet Take 1,000 mg by mouth 2 (two) times daily. 04/29/18  Yes [provider]  sitaGLIPtin (JANUVIA) 100 MG tablet Take 100 mg by mouth daily. 10/19/18  Yes [provider]  SUBOXONE 8-2 MG FILM  12/21/18  Yes [provider]  traZODone (DESYREL) 50 MG tablet Take 1 tablet (50 mg total) by mouth at bedtime as needed for sleep. Patient taking differently: Take 100 mg by mouth  at bedtime as needed for sleep.  05/11/17  Yes Charm Rings, NP    Allergies  Allergen Reactions  . Atorvastatin Other (See Comments)    Leg cramps and body aches    Social History   Tobacco Use  . Smoking status: Current Some Day Smoker    Packs/day: 0.25    Years: 30.00    Pack years: 7.50    Types: Cigarettes  . Smokeless tobacco: Former Engineer, water Use Topics  . Alcohol use: No  . Drug use: Yes    Types: Heroin, IV     Comment: Fentanyl and "Oxy" per patient     Family History  Problem Relation Age of Onset  . COPD Father      Objective:  Constitutional: Awake, alert, nad,  Eyes: anicteric Cardiovascular: RRR Respiratory: CTA B; normal respiratory effort Gastrointestinal: soft, nt, nd Musculoskeletal: no edema Skin: No rashes; no porphyria cutanea tarda Lymphatic: no cervical lymphadenopathy   Laboratory Genotype:  Lab Results  Component Value Date   HCVGENOTYPE Comment 01/05/2019   HCV viral load:  Lab Results  Component Value Date   HCVQUANT 30 (L) 12/25/2018   Lab Results  Component Value Date   WBC 3.4 (L) 09/20/2019   HGB 11.8 (L) 09/20/2019   HCT 34.6 (L) 09/20/2019   MCV 87.6 09/20/2019   PLT 35 (L) 09/20/2019    Lab Results  Component Value Date   CREATININE 1.22 09/20/2019   BUN 17 09/20/2019   NA 135 09/20/2019   K 4.3 09/20/2019   CL 100 09/20/2019   CO2 23 09/20/2019    Lab Results  Component Value Date   ALT 34 09/20/2019   AST 32 09/20/2019   ALKPHOS 93 09/20/2019     Labs and history reviewed and show CHILD-PUGH A  5-6 points: Child class A 7-9 points: Child class B 10-15 points: Child class C  Lab Results  Component Value Date   INR 1.24 12/24/2018   BILITOT 0.9 09/20/2019   ALBUMIN 4.1 09/20/2019     Assessment: New Patient with Chronic Hepatitis C genotype 1a, untreated. He was previously treated and confirmed SVR so I consider this a reinfection.   I discussed with the patient the lab findings that confirm chronic hepatitis C as well as the natural history and progression of disease including about 30% of people who develop cirrhosis of the liver if left untreated and once cirrhosis is established there is a 2-7% risk per year of liver cancer and liver failure.  I discussed the importance of treatment and benefits in reducing the risk, even if significant liver fibrosis exists.   Plan: 1) Patient counseled extensively on limiting  acetaminophen to no more than 2 grams daily, avoidance of alcohol. 2) Transmission discussed with patient including sexual transmission, sharing razors and toothbrush.   3) he is in substance abuse counseling and going to be on suboxone. 4) [facility] willl [facility] for continued care of his cirrhosis 5) with his significant thrombocytopenia, I will treat this as a decompensated cirrhosis.  I will avoid ribavirin with his pancytopenia. Will prescribe Epclusa for 24 weeks once labs are confirmed and ultrasound is done, as long as no mass detected.  6) will do ultrasound for HCC screening 7) will check hepatitis A and B serology 8) he has had a pneumovax

## 2019-09-21 NOTE — Patient Instructions (Signed)
Date 09/21/19  Dear Chase Reed, As discussed in the [facility], your hepatitis C therapy will include the following medications:    sofosbuvir 400 mg/velpatasvir 100 mg (Epclusa) oral daily  Please note that ALL MEDICATIONS WILL START ON THE SAME DATE for a total of 24 weeks. ---------------------------------------------------------------- Your HCV Treatment Start Date: TBA   Your HCV genotype: 1a    Liver Fibrosis: cirrhosis   ---------------------------------------------------------------- YOUR PHARMACY CONTACT (depending on your insurance):   [facility] [place], [place] [address] Phone: [phone] Hours: Monday to Friday 7:30 am to 6:00 pm   Please always contact your pharmacy at least 3-4 business days before you run out of medications to ensure your next month's medication is ready or 1 week prior to running out if you receive it by mail.  Remember, each prescription is for 28 days. ---------------------------------------------------------------- GENERAL NOTES REGARDING YOUR HEPATITIS C MEDICATION:  SOFOSBUVIR (SOVALDI or EPCLUSA): - Sofosbuvir 400 mg tablet is taken daily with OR without food. - The sofosbuvir tablets are yellow. - The Epclusa tablets are pink, diamond-shaped - The tablets should be stored at room temperature. - The most common side effects with sofosbuvir or Epclusa include:      1. Fatigue      2. Headache      3. Nausea      4. Diarrhea      5. Insomnia  - Acid reducing agents such as H2 blockers (ie. Pepcid (famotidine), Zantac (ranitidine), Tagamet (cimetidine), Axid (nizatidine) and proton pump inhibitors (ie. Prilosec (omeprazole), Protonix (pantoprazole), Nexium (esomeprazole), or Aciphex (rabeprazole)) can decrease effectiveness of Harvoni. Do not take until you have discussed with a health care provider.    -Antacids that contain magnesium and/or aluminum hydroxide (ie. Milk of Magensia, Rolaids, Gaviscon,  Maalox, Mylanta, an dArthritis Pain Formula)can reduce absorption of sofosbuvir, so take them at least 4 hours before or after Harvoni.  -Calcium carbonate (calcium supplements or antacids such as Tums, Caltrate, Os-Cal)needs to be taken at least 4 hours hours before or after sofosbuvir.  -St. John's wort or any products that contain St. John's wort like some herbal supplements  Please inform the office prior to starting any of these medications.   Please note that this only lists the most common side effects and is NOT a comprehensive list of the potential side effects of these medications. For more information, please review the drug information sheets that come with your medication package from the pharmacy.  ---------------------------------------------------------------- GENERAL HELPFUL HINTS ON HCV THERAPY: 1. No alcohol. 2. Stay well-hydrated 3. Notify the ID Clinic of any changes in your other over-the-counter/herbal or prescription medications. 4. If you miss a dose of your medication, take the missed dose as soon as you remember. Return to your regular time/dose schedule the next day.  5.  Do not stop taking your medications without first talking with your healthcare provider. 6.  You may take Tylenol (acetaminophen), as long as the dose is less than 2000 mg (OR no more than 4 tablets of the Tylenol Extra Strengths 500mg  tablet) in 24 hours. 7. You will follow up with our clinic pharmacist initially after starting the medication to monitor for any possible side effects 8. You will get labs once during treatment, soon after treatment completion and again 6 months or more after treatment completion to verify the virus is completely gone.   Thayer Headings, [place] for [facility] [place]  [address] [place], [place]  [address] [phone]

## 2019-09-22 MED FILL — SUBOXONE 8 MG-2 MG SL FILM: 8-2 | 12 days supply | Qty: 36 | Fill #0

## 2019-09-23 ENCOUNTER — Telehealth: Payer: Self-pay | Admitting: Hematology and Oncology

## 2019-09-23 ENCOUNTER — Encounter: Payer: Self-pay | Admitting: Family Medicine

## 2019-09-23 ENCOUNTER — Other Ambulatory Visit: Payer: Self-pay | Admitting: Hematology and Oncology

## 2019-09-23 DIAGNOSIS — D509 Iron deficiency anemia, unspecified: Secondary | ICD-10-CM

## 2019-09-23 MED ORDER — IRON (FERROUS SULFATE) 325 (65 FE) MG PO TABS: 325.0000 mg | ORAL_TABLET | Freq: Every day | ORAL | 2 refills | Status: DC

## 2019-09-23 MED ORDER — PROCHLORPERAZINE MALEATE 10 MG PO TABS: 10.0000 mg | ORAL_TABLET | Freq: Four times a day (QID) | ORAL | 0 refills | Status: DC | PRN

## 2019-09-23 NOTE — Telephone Encounter (Signed)
Called Chase Reed to discuss the results of his bloodwork. His Plt count dropped to 35 (from 45-50 range). Additionally his labs showed an iron deficiency (TIBC 456) sat 12% with ferritin of 42.    Called in PO iron 325mg  daily to help replete his iron stores.  Additionally he is 53 yrs old and has not had a screening colonoscopy. Given this unexplained iron deficiency a colonoscopy is currently indicated for dual reasons.  Scheduled for RTC in 3 months time for continued assessment.   Chase Peoples, MD Department of Hematology/Oncology [place] at [facility] Phone: [phone] Pager: [phone] Email: [email or web address]

## 2019-09-24 LAB — METHYLMALONIC ACID, SERUM: Methylmalonic Acid, Quantitative: 273 nmol/L (ref 0–378)

## 2019-09-28 LAB — COMPLETE METABOLIC PANEL WITH GFR
AG Ratio: 1.2 (calc) (ref 1.0–2.5)
ALT: 25 U/L (ref 9–46)
AST: 30 U/L (ref 10–35)
Albumin: 4.2 g/dL (ref 3.6–5.1)
Alkaline phosphatase (APISO): 89 U/L (ref 35–144)
BUN: 19 mg/dL (ref 7–25)
CO2: 26 mmol/L (ref 20–32)
Calcium: 9.1 mg/dL (ref 8.6–10.3)
Chloride: 99 mmol/L (ref 98–110)
Creat: 1.02 mg/dL (ref 0.70–1.33)
GFR, Est African American: 98 mL/min/{1.73_m2} (ref >=60)
GFR, Est Non African American: 85 mL/min/{1.73_m2} (ref >=60)
Globulin: 3.4 g/dL (calc) (ref 1.9–3.7)
Glucose, Bld: 185 mg/dL — ABNORMAL HIGH (ref 65–99)
Potassium: 3.8 mmol/L (ref 3.5–5.3)
Sodium: 134 mmol/L — ABNORMAL LOW (ref 135–146)
Total Bilirubin: 0.5 mg/dL (ref 0.2–1.2)
Total Protein: 7.6 g/dL (ref 6.1–8.1)

## 2019-09-28 LAB — HEPATITIS B SURFACE ANTIGEN: Hepatitis B Surface Ag: NONREACTIVE

## 2019-09-28 LAB — CBC WITH DIFFERENTIAL/PLATELET
Absolute Monocytes: 333 cells/uL (ref 200–950)
Basophils Absolute: 10 cells/uL (ref 0–200)
Basophils Relative: 0.5 %
Eosinophils Absolute: 49 cells/uL (ref 15–500)
Eosinophils Relative: 2.6 %
HCT: 32.9 % — ABNORMAL LOW (ref 38.5–50.0)
Hemoglobin: 11.1 g/dL — ABNORMAL LOW (ref 13.2–17.1)
Lymphs Abs: 443 cells/uL — ABNORMAL LOW (ref 850–3900)
MCH: 29.9 pg (ref 27.0–33.0)
MCHC: 33.7 g/dL (ref 32.0–36.0)
MCV: 88.7 fL (ref 80.0–100.0)
MPV: 10.9 fL (ref 7.5–12.5)
Monocytes Relative: 17.5 %
Neutro Abs: 1066 cells/uL — ABNORMAL LOW (ref 1500–7800)
Neutrophils Relative %: 56.1 %
Platelets: 33 10*3/uL — ABNORMAL LOW (ref 140–400)
RBC: 3.71 10*6/uL — ABNORMAL LOW (ref 4.20–5.80)
RDW: 13 % (ref 11.0–15.0)
Total Lymphocyte: 23.3 %
WBC: 1.9 10*3/uL — ABNORMAL LOW (ref 3.8–10.8)

## 2019-09-28 LAB — HEPATITIS C GENOTYPE: HCV Genotype: 3

## 2019-09-28 LAB — HEPATITIS A ANTIBODY, TOTAL: Hepatitis A AB,Total: NONREACTIVE

## 2019-09-28 LAB — HEPATITIS B CORE ANTIBODY, TOTAL: Hep B Core Total Ab: NONREACTIVE

## 2019-09-28 LAB — PROTIME-INR
INR: 1
Prothrombin Time: 10.8 s (ref 9.0–11.5)

## 2019-09-28 LAB — HIV ANTIBODY (ROUTINE TESTING W REFLEX): HIV 1&2 Ab, 4th Generation: NONREACTIVE

## 2019-09-28 LAB — HEPATITIS C RNA QUANTITATIVE
HCV Quantitative Log: 7.08 Log IU/mL — ABNORMAL HIGH
HCV RNA, PCR, QN: 12000000 IU/mL — ABNORMAL HIGH

## 2019-09-28 LAB — HEPATITIS B SURFACE ANTIBODY,QUALITATIVE: Hep B S Ab: NONREACTIVE

## 2019-10-03 ENCOUNTER — Other Ambulatory Visit: Payer: Self-pay | Admitting: Internal Medicine

## 2019-10-03 ENCOUNTER — Other Ambulatory Visit: Payer: Medicaid Other

## 2019-10-03 ENCOUNTER — Ambulatory Visit
Admission: RE | Admit: 2019-10-03 | Discharge: 2019-10-03 | Disposition: A | Discharge status: Home / Self Care | Payer: Medicaid Other | Source: Ambulatory Visit | Attending: Internal Medicine | Admitting: Internal Medicine

## 2019-10-03 DIAGNOSIS — B182 Chronic viral hepatitis C: Secondary | ICD-10-CM

## 2019-10-03 MED ORDER — SOFOSBUVIR-VELPATASVIR 400-100 MG PO TABS: 1.0000 | ORAL_TABLET | Freq: Every day | ORAL | 5 refills | Status: DC

## 2019-10-04 NOTE — Telephone Encounter (Signed)
[facility] Patient Advocate Encounter   Received notification from [place] that prior authorization for Chase Reed is required.   PA submitted on 10/04/2019 Key [id number] W Status is pending, will continue to check [place] tracks for determination.  Recipient ID: [id number] [place], CPhT Specialty Pharmacy Patient [facility] for Infectious Disease Phone: [phone] Fax: [phone] 10/04/2019 10:08 AM

## 2019-10-04 NOTE — Telephone Encounter (Addendum)
[facility] Patient Advocate Encounter  Prior Authorization for Chase Reed has been approved.    PA# [id number] Effective dates: 10/04/2019 through 12/03/2019  Patients co-pay is $3.00  [facility] Patient Advocate Encounter left HIPAA compliant voicemail on the patient's preferred phone number.   Bartholomew Crews, CPhT Specialty Pharmacy Patient [facility] for Infectious Disease Phone: [phone] Fax: [phone] 10/04/2019 3:08 PM

## 2019-10-05 ENCOUNTER — Telehealth: Payer: Self-pay | Admitting: Pharmacist

## 2019-10-05 ENCOUNTER — Encounter: Payer: Self-pay | Admitting: Pharmacy Technician

## 2019-10-05 MED FILL — SOFOSBUVIR-VELPATASVIR 400-: 400-100 | 28 days supply | Qty: 28 | Fill #0

## 2019-10-10 NOTE — Telephone Encounter (Signed)
Thank you [name].

## 2019-10-31 MED FILL — SOFOSBUVIR-VELPATASVIR 400-: 400-100 | 28 days supply | Qty: 28 | Fill #1

## 2019-11-06 ENCOUNTER — Other Ambulatory Visit: Payer: Self-pay | Admitting: Pharmacist

## 2019-11-06 DIAGNOSIS — B182 Chronic viral hepatitis C: Secondary | ICD-10-CM

## 2019-11-08 ENCOUNTER — Ambulatory Visit: Payer: Medicaid Other | Admitting: Pharmacist

## 2019-11-16 ENCOUNTER — Ambulatory Visit: Payer: Medicaid Other | Admitting: Pharmacist

## 2019-11-16 ENCOUNTER — Other Ambulatory Visit: Payer: Self-pay | Admitting: Hematology and Oncology

## 2019-11-22 ENCOUNTER — Ambulatory Visit: Payer: Medicaid Other | Admitting: Pharmacist

## 2019-11-29 ENCOUNTER — Telehealth: Payer: Self-pay | Admitting: Pharmacy Technician

## 2019-11-29 NOTE — Telephone Encounter (Signed)
[facility] Patient Advocate Encounter   Received notification from [facility] that the patient's medication required a new prior authorization for the final 4 weeks of treatment from [facility] Medicaid. When I looked at the chart for the labs I saw that the patient no showed all the follow-up appointments. We do not have the necessary labs to get a renewal at this time.   I reached out to the patient to see if he could come in today and he stated that he currently had 20 tablets and was good to wait until 02/02 appointment to get his updated labs. He confirmed his start date was 12/03 so the dates of refills would indicate several missed doses.

## 2019-12-06 ENCOUNTER — Ambulatory Visit: Payer: Medicaid Other | Admitting: Pharmacist

## 2019-12-07 NOTE — Progress Notes (Signed)
HPI: Chase Reed is a 54 y.o. male who presents to the [facility] pharmacy clinic for Hepatitis C follow-up.  Medication: Dorita Fray  Start Date: 10/05/2019, however patient reports waiting a few days before starting to take the medication. He reports he is on second half of 2nd month of [place]. Unsure of exact start date, but probably mid-late December is when he started.  Hepatitis C Genotype: Genotype 1a  Fibrosis Score: Decompensated cirrohsis and thrombocytopenia- platelets 35   Hepatitis C RNA: 12,000,000 on 09/21/2019  Patient Active Problem List   Diagnosis Date Noted  . Pancytopenia (HCC) 02/03/2019  . Chronic viral hepatitis C (HCC) 02/03/2019  . 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 Chest wall abscess 12/24/2018  . MSSA bacteremia 12/24/2018  . Cirrhosis (HCC) 12/24/2018  . Substance induced mood disorder (HCC) 12/21/2016  . Polysubstance (including opioids) dependence w/o physiol dependence (HCC) 05/02/2016  . 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 Elevated lipase 05/02/2016    Patient's Medications  New Prescriptions   No medications on file  Previous Medications   ASPIRIN EC 81 MG EC TABLET    Take 1 tablet (81 mg total) by mouth daily.   CLONIDINE (CATAPRES) 0.1 MG TABLET    Take 1 tablet by mouth daily.   DULOXETINE (CYMBALTA) 60 MG CAPSULE    Take 60 mg by mouth daily.   GABAPENTIN (NEURONTIN) 600 MG TABLET    take 1 tablet by mouth THREE TIMES DAILY   INSULIN ASPART (NOVOLOG) 100 UNIT/ML INJECTION    Inject 2-8 Units into the skin See admin instructions. Sliding scale.over 250--2 units over 300--4 units over 350--6 units over 400--8 units Up to 50 units daily: For diabetes management   INSULIN GLARGINE (LANTUS) 100 UNIT/ML INJECTION    Inject 30 Units into the skin daily.    IRON, FERROUS SULFATE, 325 (65 FE) MG TABS    Take 325 mg by mouth daily.   LISINOPRIL (ZESTRIL) 20 MG TABLET    Take 1 tablet by mouth daily.     METFORMIN (GLUCOPHAGE) 1000 MG TABLET    Take 1,000 mg by mouth 2 (two) times daily.   PROCHLORPERAZINE (COMPAZINE) 10 MG TABLET    Take 1 tablet (10 mg total) by mouth every 6 (six) hours as needed for nausea or vomiting.   SITAGLIPTIN (JANUVIA) 100 MG TABLET    Take 100 mg by mouth daily.   SOFOSBUVIR-VELPATASVIR (EPCLUSA) 400-100 MG TABS    Take 1 tablet by mouth daily.   SUBOXONE 8-2 MG FILM       TRAZODONE (DESYREL) 50 MG TABLET    Take 1 tablet (50 mg total) by mouth at bedtime as needed for sleep.  Modified Medications   No medications on file  Discontinued Medications   No medications on file    Allergies: Allergies  Allergen Reactions  . Atorvastatin Other (See Comments)    Leg cramps and body aches    Past Medical History: Past Medical History:  Diagnosis Date  . Cirrhosis (HCC)   . Diabetes mellitus without complication (HCC)   . Hepatitis     Social History: Social History   Socioeconomic History  . Marital status: Married    Spouse name: Not on file  . Number of children: Not on file  . Years of education: Not on file  . Highest education level: Not on file  Occupational History  . Occupation: Software engineer  Tobacco Use  . Smoking status: Current Some Day Smoker  Packs/day: 0.25    Years: 30.00    Pack years: 7.50    Types: Cigarettes  . Smokeless tobacco: Former Network engineer and Sexual Activity  . Alcohol use: No  . Drug use: Yes    Types: Heroin, IV    Comment: Fentanyl and "Oxy" per patient   . Sexual activity: Never  Other Topics Concern  . Not on file  Social History Narrative  . Not on file   Social Determinants of Health   Financial Resource Strain:   . Difficulty of Paying Living Expenses: Not on file  Food Insecurity:   . Worried About Charity fundraiser in the Last Year: Not on file  . Ran Out of Food in the Last Year: Not on file  Transportation Needs:   . Lack of Transportation (Medical): Not on file  . Lack of  Transportation (Non-Medical): Not on file  Physical Activity:   . Days of Exercise per Week: Not on file  . Minutes of Exercise per Session: Not on file  Stress:   . Feeling of Stress : Not on file  Social Connections:   . Frequency of Communication with Friends and Family: Not on file  . Frequency of Social Gatherings with Friends and Family: Not on file  . Attends Religious Services: Not on file  . Active Member of Clubs or Organizations: Not on file  . Attends Archivist Meetings: Not on file  . Marital Status: Not on file    Labs: Hepatitis C Lab Results  Component Value Date   HCVGENOTYPE 3 09/21/2019   HCVRNAPCRQN 12,000,000 (H) 09/21/2019   Hepatitis B Lab Results  Component Value Date   HEPBSAB NON-REACTIVE 09/21/2019   HEPBSAG NON-REACTIVE 09/21/2019   HEPBCAB NON-REACTIVE 09/21/2019   Hepatitis A Lab Results  Component Value Date   HAV NON-REACTIVE 09/21/2019   HIV Lab Results  Component Value Date   HIV NON-REACTIVE 09/21/2019   HIV Non Reactive 12/24/2018   Lab Results  Component Value Date   CREATININE 1.02 09/21/2019   CREATININE 1.22 09/20/2019   CREATININE 0.80 01/17/2019   CREATININE 0.98 01/15/2019   CREATININE 0.94 01/11/2019   Lab Results  Component Value Date   AST 30 09/21/2019   AST 32 09/20/2019   AST 15 12/26/2018   ALT 25 09/21/2019   ALT 34 09/20/2019   ALT 9 12/26/2018   INR 1.0 09/21/2019   INR 1.24 12/24/2018   INR 0.99 12/23/2016    Assessment: Romel is here for Hepatitis C re-infection 4-week follow-up. He tested positive in March 2020. He was previously treated for hepatitis C 6 years ago with Harvoni and reached SVR. He is high risk due to IVDU. He has recovered in the past and was in a methadone program for 12 years, then he relapsed. He was recently in recovery again last year on suboxone, but soon relapsed due to feeling weird on suboxone and being in immense pain. 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 Daymian is currently injecting heroin with last use being 3 hours ago. He says he uses it for relief of pain and not to get high.   The patient says he is taking Epclusa daily and is on the second half of the second month of the medication. He reports fatigue, insomnia, and headache, but no stomach upset. He does not take any medications to treat symptoms.  Plan:  - Hep  C viral load - Needs hep A and B vaccines in the future.  - Follow-up with Cassie on Wednesday April 17 at 2:15pm  Cassie L. Kuppelweiser, PharmD, BCIDP, AAHIVP, CPP Clinical Pharmacist Practitioner Infectious Diseases Clinical Pharmacist [facility] 12/08/2019, 4:48 PM

## 2019-12-08 ENCOUNTER — Other Ambulatory Visit: Payer: Self-pay

## 2019-12-08 ENCOUNTER — Ambulatory Visit (INDEPENDENT_AMBULATORY_CARE_PROVIDER_SITE_OTHER): Payer: Medicaid Other | Admitting: Pharmacist

## 2019-12-08 DIAGNOSIS — B182 Chronic viral hepatitis C: Secondary | ICD-10-CM

## 2019-12-11 LAB — HEPATITIS C RNA QUANTITATIVE
HCV Quantitative Log: <1.18 Log IU/mL
HCV RNA, PCR, QN: <15 IU/mL

## 2019-12-11 LAB — COMPREHENSIVE METABOLIC PANEL
AG Ratio: 1.3 (calc) (ref 1.0–2.5)
ALT: 11 U/L (ref 9–46)
AST: 17 U/L (ref 10–35)
Albumin: 4.5 g/dL (ref 3.6–5.1)
Alkaline phosphatase (APISO): 74 U/L (ref 35–144)
BUN: 23 mg/dL (ref 7–25)
CO2: 25 mmol/L (ref 20–32)
Calcium: 9.3 mg/dL (ref 8.6–10.3)
Chloride: 102 mmol/L (ref 98–110)
Creat: 1.01 mg/dL (ref 0.70–1.33)
Globulin: 3.4 g/dL (calc) (ref 1.9–3.7)
Glucose, Bld: 90 mg/dL (ref 65–99)
Potassium: 4.4 mmol/L (ref 3.5–5.3)
Sodium: 136 mmol/L (ref 135–146)
Total Bilirubin: 0.7 mg/dL (ref 0.2–1.2)
Total Protein: 7.9 g/dL (ref 6.1–8.1)

## 2019-12-12 NOTE — Telephone Encounter (Signed)
[facility] Patient Advocate Encounter   Received notification from [employer] that prior authorization for Dorita Fray is required.   PA submitted on 12/12/2019 Key [id number] W Status is pending on [place] tracks    [facility] Clinic will continue to follow.  Beulah Gandy, CPhT Specialty Pharmacy Patient [facility] for Infectious Disease Phone: [phone] Fax: [phone] 12/12/2019 11:29 AM

## 2019-12-12 NOTE — Telephone Encounter (Addendum)
[facility] Patient Advocate Encounter  Prior Authorization for Dorita Fray has been approved.    PA# [id number] W Effective dates: 12/12/2019 through 01/11/2020  Patients co-pay is $3.00   [facility] Clinic will continue to follow. Patient set up for the pharmacy to mail his medication on 02/16  Beulah Gandy, CPhT Specialty Pharmacy Patient [facility] for Infectious Disease Phone: [phone] Fax: [phone] 12/12/2019 3:49 PM

## 2019-12-20 MED FILL — SOFOSBUVIR-VELPATASVIR 400-: 400-100 | 28 days supply | Qty: 28 | Fill #2

## 2019-12-21 ENCOUNTER — Other Ambulatory Visit: Payer: Medicaid Other

## 2019-12-21 ENCOUNTER — Ambulatory Visit: Payer: Medicaid Other | Admitting: Hematology and Oncology

## 2020-01-16 ENCOUNTER — Telehealth: Payer: Self-pay | Admitting: Pharmacy Technician

## 2020-01-16 NOTE — Telephone Encounter (Addendum)
[facility] Patient Advocate Encounter   Received notification from [facility] Medicaid that prior authorization for Epclusa is required.   PA submitted on 01/16/2020 Key [id number] W Status is approved for 4 weeks 01/16/2020 - 02/15/2020  This will be for the 4th month of 6 needed.    [facility] Clinic will continue to follow.   Netty Starring. Dimas Aguas CPhT Specialty Pharmacy Patient [facility] for Infectious Disease Phone: [phone] Fax:  [phone]

## 2020-01-17 MED FILL — SOFOSBUVIR-VELPATASVIR 400-: 400-100 | 28 days supply | Qty: 28 | Fill #3

## 2020-02-08 ENCOUNTER — Ambulatory Visit: Payer: Medicaid Other | Admitting: Pharmacist

## 2020-02-13 ENCOUNTER — Telehealth: Payer: Self-pay | Admitting: Pharmacy Technician

## 2020-02-13 MED FILL — SOFOSBUVIR-VELPATASVIR 400-: 400-100 | 28 days supply | Qty: 28 | Fill #4

## 2020-02-13 NOTE — Telephone Encounter (Signed)
[facility] Patient Advocate Encounter   Received notification from [facility] MEDICAID that prior authorization for EPCLUSA (generic) is required for 5th month.   PA submitted on 02/13/2020 Key [id number] W Status is pending    [facility] Clinic will continue to follow.   Netty Starring. Dimas Aguas CPhT Specialty Pharmacy Patient [facility] for Infectious Disease Phone: [phone] Fax:  [phone]

## 2020-02-13 NOTE — Telephone Encounter (Addendum)
[facility] Patient Advocate Encounter  Prior Authorization for [employer] has been approved.    PA# [id number] Effective dates: 02/13/2020 through 03/14/2020   This is #5 of 6 fills.  Patients co-pay is $3.   [facility] Clinic will continue to follow.  Netty Starring. Dimas Aguas CPhT Specialty Pharmacy Patient [facility] for Infectious Disease Phone: [phone] Fax:  [phone]

## 2020-03-12 ENCOUNTER — Telehealth: Payer: Self-pay | Admitting: Pharmacy Technician

## 2020-03-12 NOTE — Telephone Encounter (Signed)
[facility] Patient Advocate Encounter  Prior Authorization for Dorita Fray generic has been approved.    PA# [id number] Effective dates: 03/12/2020 through 04/11/2020  Patients co-pay is $3.   [facility] Clinic will continue to follow.  Netty Starring. Dimas Aguas CPhT Specialty Pharmacy Patient [facility] for Infectious Disease Phone: [phone] Fax:  [phone]

## 2020-03-12 NOTE — Telephone Encounter (Signed)
[facility] Patient Advocate Encounter   Received notification from [facility] MEDICAID that prior authorization for [facility] is required.   PA submitted on 03/12/2020 Key [id number] W Status is pending    [facility] Clinic will continue to follow.   Netty Starring. Dimas Aguas CPhT Specialty Pharmacy Patient [facility] for Infectious Disease Phone: [phone] Fax:  [phone]

## 2020-03-13 IMAGING — US US ABDOMEN LIMITED
1 series · 14 of 25 positions shown · non-contrast
Comparison: None.

CLINICAL DATA: Chronic hepatitis-C

EXAM:
ULTRASOUND ABDOMEN LIMITED RIGHT UPPER QUADRANT

[Series 1: us abdomen limited · 0.23mm/px · 14 of 54 slices shown]
[im 1/54]
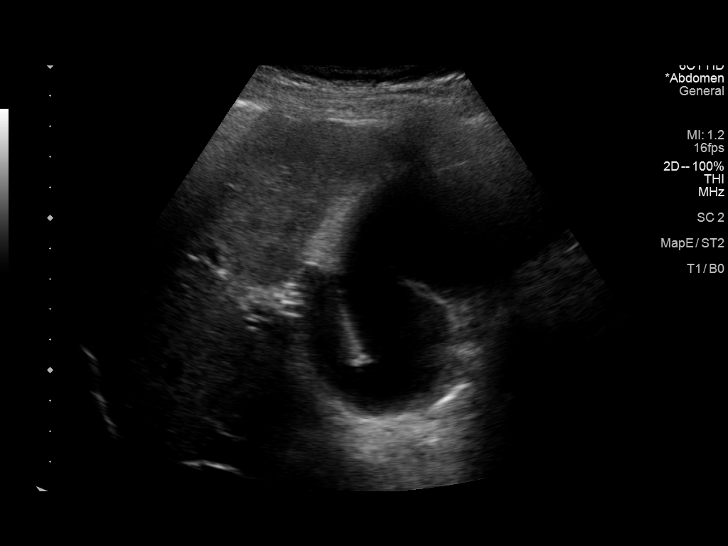
[im 5/54]
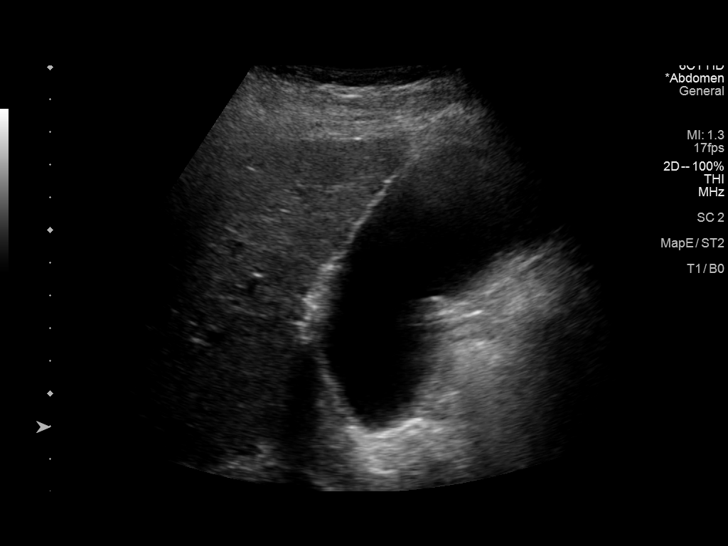
[im 9/54]
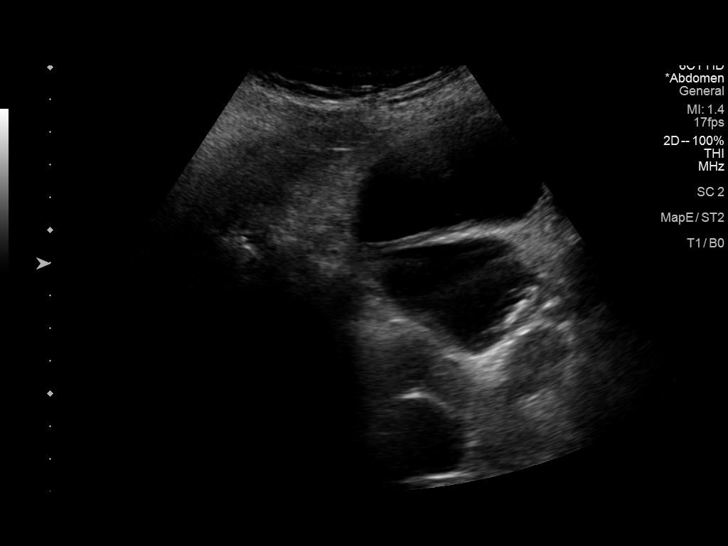
[im 14/54]
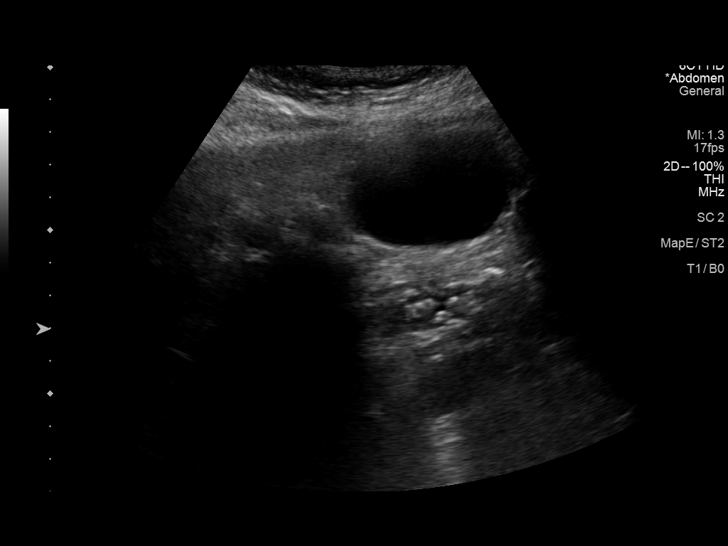
[im 18/54]
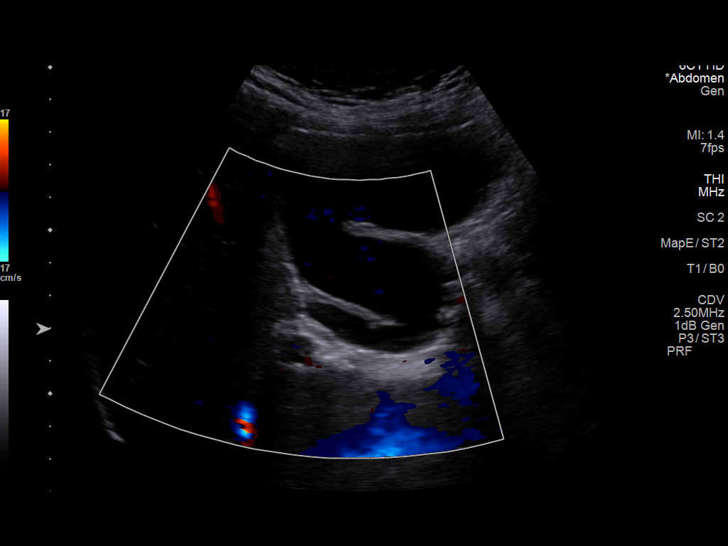
[im 20/54]
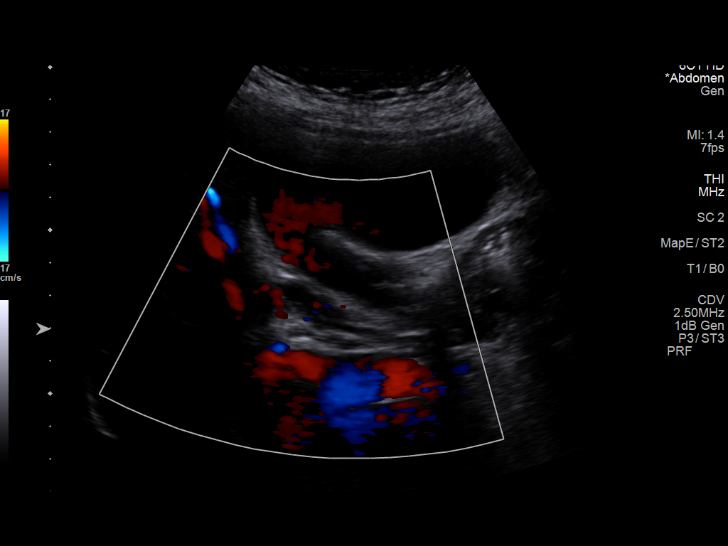
[im 25/54]
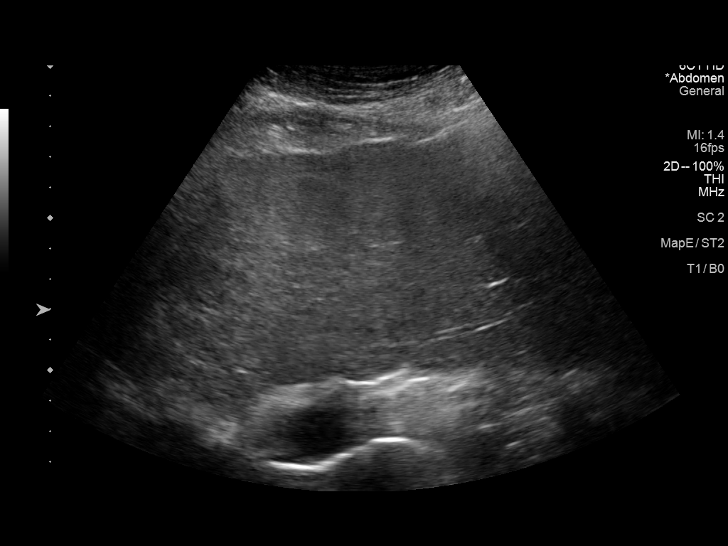
[im 29/54]
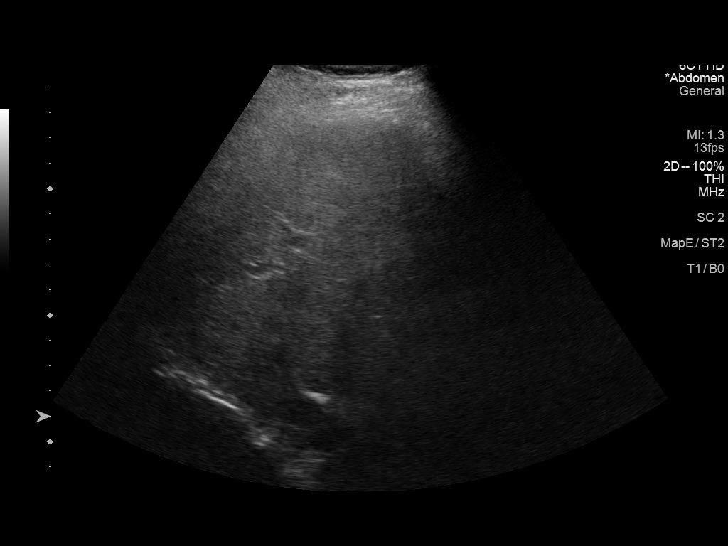
[im 34/54]
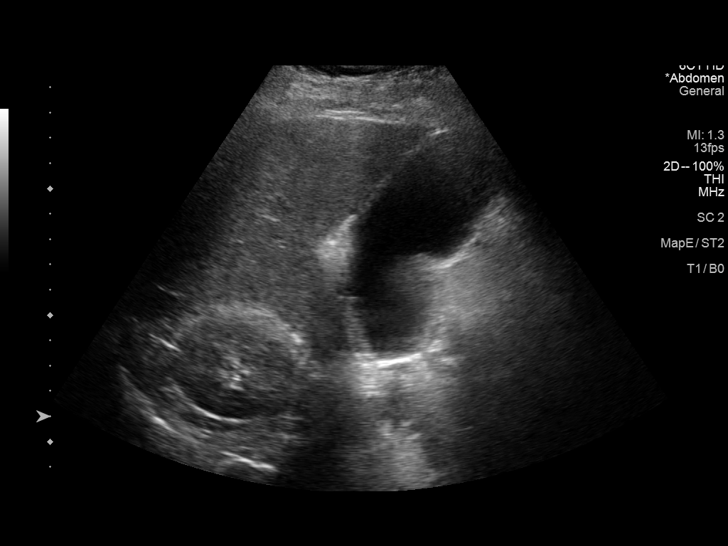
[im 36/54]
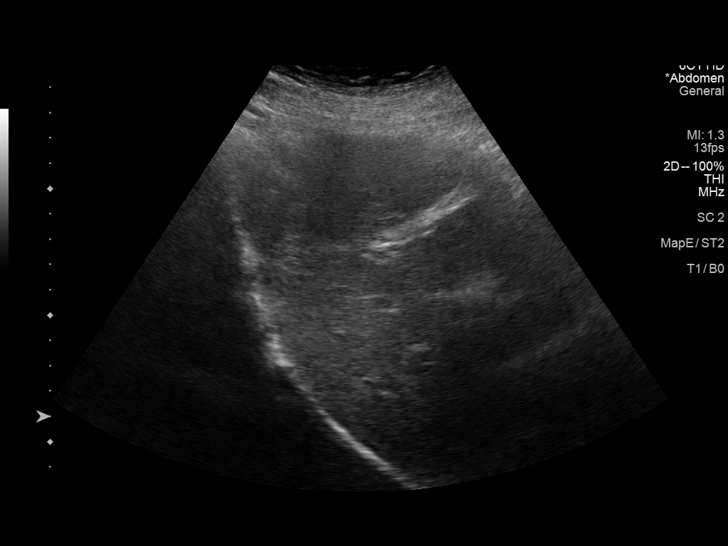
[im 40/54]
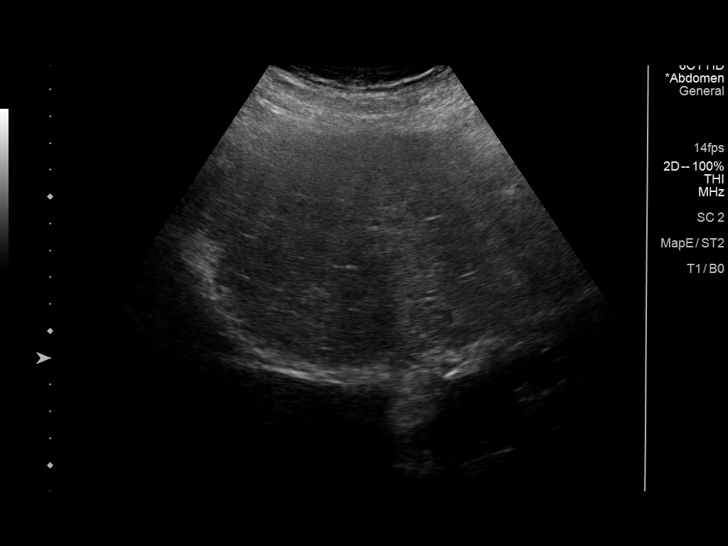
[im 45/54]
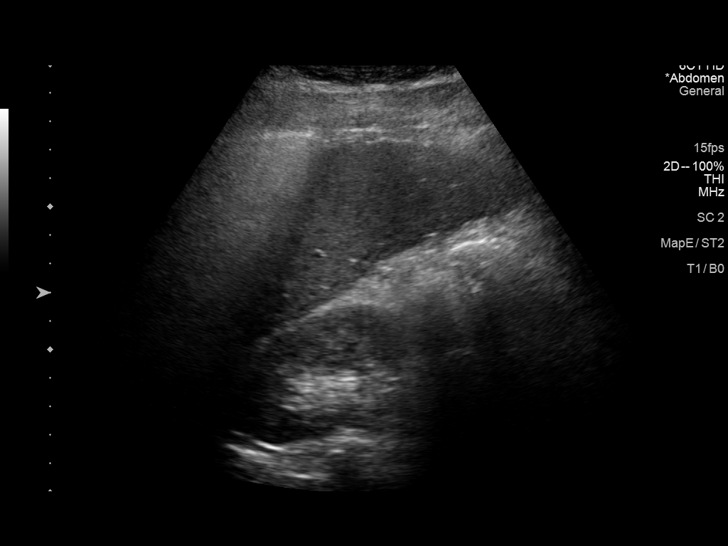
[im 49/54]
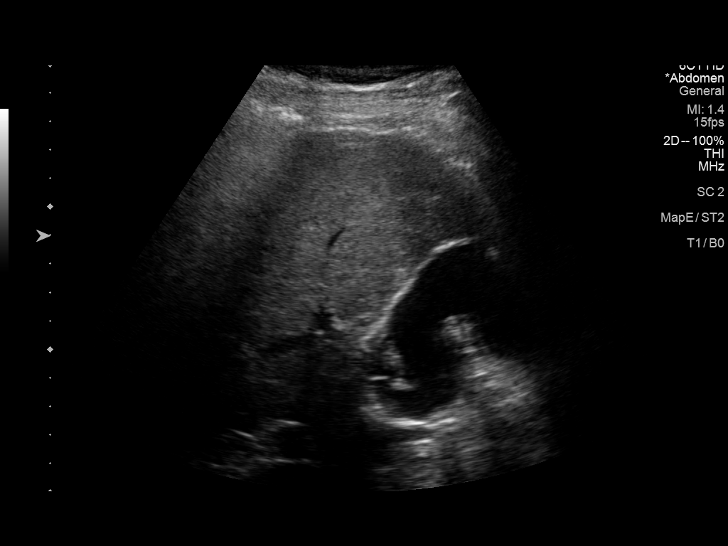
[im 54/54]
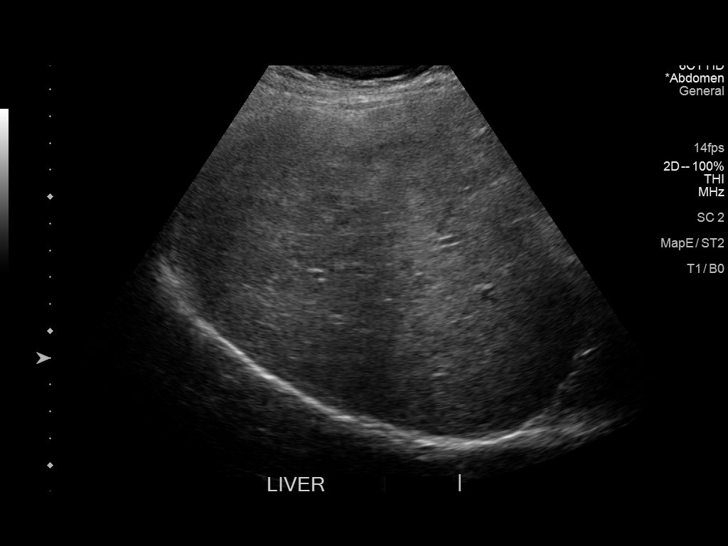

[14 of 25 positions shown; findings below may reference images not displayed]

FINDINGS: Gallbladder:

Within the gallbladder, there are several small echogenic foci which
move and shadow consistent with cholelithiasis. Largest gallstone
measures 3 mm. There is no gallbladder wall thickening or
pericholecystic fluid. No sonographic Murphy sign noted by
sonographer.

Common bile duct:

Diameter: 3 mm. No intrahepatic or extrahepatic biliary duct
dilatation.

Liver:

No focal lesion identified. The liver contour is subtly nodular with
overall increased liver echogenicity. Portal vein is patent on color
Doppler imaging with normal direction of blood flow towards the
liver.

Other: None.
IMPRESSION: 1. Cholelithiasis. No gallbladder wall thickening or pericholecystic
fluid.

2. Liver has a rather subtly nodular contour. Liver echogenicity is
increased. These are findings most likely indicative of cirrhosis.
No focal liver lesions are evident on this study.

## 2020-04-04 ENCOUNTER — Other Ambulatory Visit: Payer: Self-pay | Admitting: Internal Medicine

## 2020-04-05 ENCOUNTER — Ambulatory Visit: Payer: Medicaid Other | Admitting: Pharmacist

## 2020-04-09 ENCOUNTER — Telehealth: Payer: Self-pay | Admitting: Pharmacy Technician

## 2020-04-09 ENCOUNTER — Other Ambulatory Visit: Payer: Self-pay

## 2020-04-09 ENCOUNTER — Ambulatory Visit (INDEPENDENT_AMBULATORY_CARE_PROVIDER_SITE_OTHER): Payer: Medicaid Other | Admitting: Pharmacist

## 2020-04-09 DIAGNOSIS — B182 Chronic viral hepatitis C: Secondary | ICD-10-CM

## 2020-04-09 NOTE — Progress Notes (Signed)
HPI: Chase Reed is a 54 y.o. male who presents to the [facility] for Hepatitis C follow-up.  Medication: Epclusa x 24 weeks  Start Date: mid December  Hepatitis C Genotype: 1a/3  Fibrosis Score: decompensated cirrhosis  Hepatitis C RNA: 12 million 09/21/19, <15 12/08/19  Patient Active Problem List   Diagnosis Date Noted  . Pancytopenia ([place]) 02/03/2019  . Chronic viral hepatitis C ([place]) 02/03/2019  . 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 Chest wall abscess 12/24/2018  . MSSA bacteremia 12/24/2018  . Cirrhosis ([place]) 12/24/2018  . Substance induced mood disorder ([place]) 12/21/2016  . Polysubstance (including opioids) dependence w/o physiol dependence ([place]) 05/02/2016  . Substance-induced psychotic disorder with hallucinations ([place]) 05/02/2016  . Elevated lipase 05/02/2016    Patient's Medications  New Prescriptions   No medications on file  Previous Medications   ASPIRIN EC 81 MG EC TABLET    Take 1 tablet (81 mg total) by mouth daily.   CLONIDINE (CATAPRES) 0.1 MG TABLET    Take 1 tablet by mouth daily.   DULOXETINE (CYMBALTA) 60 MG CAPSULE    Take 60 mg by mouth daily.   GABAPENTIN (NEURONTIN) 600 MG TABLET    take 1 tablet by mouth THREE TIMES DAILY   INSULIN ASPART (NOVOLOG) 100 UNIT/ML INJECTION    Inject 2-8 Units into the skin See admin instructions. Sliding scale.over 250--2 units over 300--4 units over 350--6 units over 400--8 units Up to 50 units daily: For diabetes management   INSULIN GLARGINE (LANTUS) 100 UNIT/ML INJECTION    Inject 30 Units into the skin daily.    IRON, FERROUS SULFATE, 325 (65 FE) MG TABS    Take 325 mg by mouth daily.   LISINOPRIL (ZESTRIL) 20 MG TABLET    Take 1 tablet by mouth daily.   METFORMIN (GLUCOPHAGE) 1000 MG TABLET    Take 1,000 mg by mouth 2 (two) times daily.   PROCHLORPERAZINE (COMPAZINE) 10 MG TABLET    Take 1 tablet (10 mg total) by mouth every 6 (six) hours as needed for nausea or vomiting.   SITAGLIPTIN  (JANUVIA) 100 MG TABLET    Take 100 mg by mouth daily.   SOFOSBUVIR-VELPATASVIR (EPCLUSA) 400-100 MG TABS    Take 1 tablet by mouth daily.   SUBOXONE 8-2 MG FILM       TRAZODONE (DESYREL) 50 MG TABLET    Take 1 tablet (50 mg total) by mouth at bedtime as needed for sleep.  Modified Medications   No medications on file  Discontinued Medications   No medications on file    Allergies: Allergies  Allergen Reactions  . Atorvastatin Other (See Comments)    Leg cramps and body aches    Past Medical History: Past Medical History:  Diagnosis Date  . Cirrhosis ([place])   . Diabetes mellitus without complication ([place])   . Hepatitis     Social History: Social History   Socioeconomic History  . Marital status: Married    Spouse name: Not on file  . Number of children: Not on file  . Years of education: Not on file  . Highest education level: Not on file  Occupational History  . Occupation: Librarian, academic  Tobacco Use  . Smoking status: Current Some Day Smoker    Packs/day: 0.25    Years: 30.00    Pack years: 7.50    Types: Cigarettes  . Smokeless tobacco: Former Network engineer and Sexual Activity  . Alcohol use: No  .  Drug use: Yes    Types: Heroin, IV    Comment: Fentanyl and "Oxy" per patient   . Sexual activity: Never  Other Topics Concern  . Not on file  Social History Narrative  . Not on file   Social Determinants of Health   Financial Resource Strain:   . Difficulty of Paying Living Expenses:   Food Insecurity:   . Worried About Programme researcher, broadcasting/film/video in the Last Year:   . Barista in the Last Year:   Transportation Needs:   . Freight forwarder (Medical):   Marland Kitchen Lack of Transportation (Non-Medical):   Physical Activity:   . Days of Exercise per Week:   . Minutes of Exercise per Session:   Stress:   . Feeling of Stress :   Social Connections:   . Frequency of Communication with Friends and Family:   . Frequency of Social Gatherings with Friends and  Family:   . Attends Religious Services:   . Active Member of Clubs or Organizations:   . Attends Banker Meetings:   Marland Kitchen Marital Status:     Labs: Hepatitis C Lab Results  Component Value Date   HCVGENOTYPE 3 09/21/2019   HCVRNAPCRQN <15 NOT DETECTED 12/08/2019   HCVRNAPCRQN 12,000,000 (H) 09/21/2019   Hepatitis B Lab Results  Component Value Date   HEPBSAB NON-REACTIVE 09/21/2019   HEPBSAG NON-REACTIVE 09/21/2019   HEPBCAB NON-REACTIVE 09/21/2019   Hepatitis A Lab Results  Component Value Date   HAV NON-REACTIVE 09/21/2019   HIV Lab Results  Component Value Date   HIV NON-REACTIVE 09/21/2019   HIV Non Reactive 12/24/2018   Lab Results  Component Value Date   CREATININE 1.01 12/08/2019   CREATININE 1.02 09/21/2019   CREATININE 1.22 09/20/2019   CREATININE 0.80 01/17/2019   CREATININE 0.98 01/15/2019   Lab Results  Component Value Date   AST 17 12/08/2019   AST 30 09/21/2019   AST 32 09/20/2019   ALT 11 12/08/2019   ALT 25 09/21/2019   ALT 34 09/20/2019   INR 1.0 09/21/2019   INR 1.24 12/24/2018   INR 0.99 12/23/2016    Assessment: Chase Reed is here today for his end of therapy Hepatitis C follow up. He has about 2 tablets of Epclusa left of his 24 weeks of therapy. He states that he missed about 3-4 doses the entire time total. He did not have any issues or side effects.  He is asking for a refill today, but I confirmed with [facility] that he received all 6 months and would not need additional therapy at this time.  His early on treatment viral load was undetectable.  I will check again today and have him see Dr. Luciana Axe in 3 months for first SVR visit. I told him that he may have him follow up again to assure clearance and cure.   Plan: - Hep C RNA today - F/u with Dr. Luciana Axe 9/15 at 1115am  Tiwanda Threats L. Alainah Phang, PharmD, BCIDP, AAHIVP, CPP Clinical Pharmacist Practitioner Infectious Diseases Clinical Pharmacist [facility] 04/09/2020, 11:25 AM

## 2020-04-09 NOTE — Telephone Encounter (Signed)
[facility] Patient Advocate Encounter    Findings of the benefits investigation:   Insurance: NCMED remains active  Refill history of Sofosbuvir/Velpatasvir: 10/05/2019 10/31/2019 12/20/2019 01/17/2020 02/13/2020 03/12/2020  He tried to get a 7th refill, patient and pharmacy were updated that his therapy timeline had been completed with 6 refills.   Beulah Gandy, CPhT Specialty Pharmacy Patient [facility] Phone: [phone] Fax: [phone] 04/09/2020 8:47 AM

## 2020-04-11 LAB — HEPATITIS C RNA QUANTITATIVE
HCV Quantitative Log: <1.18 Log IU/mL — AB
HCV RNA, PCR, QN: <15 IU/mL — AB

## 2020-04-20 ENCOUNTER — Telehealth: Payer: Self-pay | Admitting: Hematology and Oncology

## 2020-04-20 NOTE — Telephone Encounter (Signed)
Scheduled per los. Called and spoke with patient. Confirmed appt 

## 2020-04-29 NOTE — Progress Notes (Signed)
[place] [phone]   [phone]  PROGRESS NOTE  Patient Care Team: Donnamarie Poag as PCP - General (Family Medicine)  Hematological/Oncological History # Pancytopenia 1) 12/23/2018: WBC 8.7, Hgb 11.8, Plt 97. Admitted for chest wall abscess.  2) 12/25/2018: WBC 3.9, hgb 9.5, Plt 75 3) 01/11/2019: WBC 2.3, Hgb 10.1, Plt 42 4) 01/18/2019: WBC 1.8, Hgb 9.3, Plt 55. Discharged 01/21/2019 5) 09/20/2019: Establish care with Dr. Lorenso Courier. WBC 3.4, Hgb 11.8, MCV 87.6, Plt 35  6)  04/12/2020: WBC 2.1, Hgb 11.8, MCV 92, Plt 43.  7) 04/30/2020: WBC 2.8, Hgb 12.1, MCV 89.5, Plt 46  Interval History:  Chase Reed 54 y.o. 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 The patient's last visit was on 09/20/2019 at which time he established care. In the interim since the last visit the patient's blood counts have remained relatively stable.   On exam today Chase Reed notes that he is having some trouble with his teeth.  He underwent a dental extraction on 04/26/2020 and has been having oozing ever since.  He reports that he was given some mouthwash to help with his soreness and to prevent infection.  He was also placed on antibiotics while this was being done.  He reports that he has been spitting up some clots but that the bleeding has begun to slow down.  Otherwise he denies having any recent fevers, chills, sweats, nausea, vomiting or diarrhea.  He has completed his treatment for hepatitis C and notes that he tolerated it well.  He denies having any abdominal discomfort or early satiety.  Unfortunately he is currently back to using fentanyl that he requires from the street.  He notes that his pain has been difficult to control but that is a consistent chronic issue.  Otherwise he has no questions concerns or complaints today.  A full 10 point ROS is listed below.  MEDICAL HISTORY:   Past Medical History:  Diagnosis Date  . Cirrhosis ([place])   . Diabetes mellitus without complication ([place])   . Hepatitis     SURGICAL HISTORY: Past Surgical History:  Procedure Laterality Date  . APPLICATION OF WOUND VAC N/A 12/24/2018   Procedure: APPLICATION OF WOUND VAC;  Surgeon: Gaye Pollack, MD;  Location: [facility];  Service: Thoracic;  Laterality: N/A;  . APPLICATION OF WOUND VAC N/A 12/27/2018   Procedure: WOUND VAC CHANGE;  Surgeon: Gaye Pollack, MD;  Location: [facility];  Service: Thoracic;  Laterality: N/A;  . APPLICATION OF WOUND VAC N/A 01/14/2019   Procedure: APPLICATION OF WOUND VAC;  Surgeon: Gaye Pollack, MD;  Location: [facility];  Service: Vascular;  Laterality: N/A;  . I & D EXTREMITY N/A 01/14/2019   Procedure: DEBRIDEMENT OF STERNOCLAVICULAR JOINT;  Surgeon: Gaye Pollack, MD;  Location[facility];  Service: Vascular;  Laterality: N/A;  . LITHOTRIPSY    . STERNAL WOUND DEBRIDEMENT N/A 12/24/2018   Procedure: I&D CHEST WALL ABSCESS;  Surgeon: Gaye Pollack, MD;  Location: [facility];  Service: Thoracic;  Laterality: N/A;  . STERNAL WOUND DEBRIDEMENT N/A 12/27/2018   Procedure: DEBRIDEMENT OF CHEST WALL WOUND;  Surgeon: Gaye Pollack, MD;  Location: [facility];  Service: Thoracic;  Laterality: N/A;  . TEE WITHOUT CARDIOVERSION N/A 01/03/2019   Procedure: TRANSESOPHAGEAL ECHOCARDIOGRAM (TEE);  Surgeon: Sanda Klein, MD;  Location: [place];  Service: Cardiovascular;  Laterality: N/A;  SOCIAL HISTORY: Social History   Socioeconomic History  . Marital status: Married    Spouse name: Not on file  . Number of children: Not on file  . Years of education: Not on file  . Highest education level: Not on file  Occupational History  . Occupation: Software engineer  Tobacco Use  . Smoking status: Current Some Day Smoker    Packs/day: 0.25    Years: 30.00    Pack years: 7.50    Types: Cigarettes  . Smokeless tobacco: Former Clinical biochemist  . Vaping Use: Never used    Substance and Sexual Activity  . Alcohol use: No  . Drug use: Yes    Types: Heroin, IV    Comment: Fentanyl and "Oxy" per patient   . Sexual activity: Never  Other Topics Concern  . Not on file  Social History Narrative  . Not on file   Social Determinants of Health   Financial Resource Strain:   . Difficulty of Paying Living Expenses:   Food Insecurity:   . Worried About Programme researcher, broadcasting/film/video in the Last Year:   . Barista in the Last Year:   Transportation Needs:   . Freight forwarder (Medical):   Marland Kitchen Lack of Transportation (Non-Medical):   Physical Activity:   . Days of Exercise per Week:   . Minutes of Exercise per Session:   Stress:   . Feeling of Stress :   Social Connections:   . Frequency of Communication with Friends and Family:   . Frequency of Social Gatherings with Friends and Family:   . Attends Religious Services:   . Active Member of Clubs or Organizations:   . Attends Banker Meetings:   Marland Kitchen Marital Status:   Intimate Partner Violence:   . Fear of Current or Ex-Partner:   . Emotionally Abused:   Marland Kitchen Physically Abused:   . Sexually Abused:     FAMILY HISTORY: Family History  Problem Relation Age of Onset  . COPD Father     ALLERGIES:  is allergic to atorvastatin.  MEDICATIONS:  Current Outpatient Medications  Medication Sig Dispense Refill  . aspirin EC 81 MG EC tablet Take 1 tablet (81 mg total) by mouth daily.    . cloNIDine (CATAPRES) 0.1 MG tablet Take 1 tablet by mouth daily.    . DULoxetine (CYMBALTA) 60 MG capsule Take 60 mg by mouth daily.    Marland Kitchen gabapentin (NEURONTIN) 600 MG tablet take 1 tablet by mouth THREE TIMES DAILY    . insulin aspart (NOVOLOG) 100 UNIT/ML injection Inject 2-8 Units into the skin See admin instructions. Sliding scale.over 250--2 units over 300--4 units over 350--6 units over 400--8 units Up to 50 units daily: For diabetes management 10 mL 0  . insulin glargine (LANTUS) 100 UNIT/ML injection  Inject 30 Units into the skin daily.     . Iron, Ferrous Sulfate, 325 (65 Fe) MG TABS Take 325 mg by mouth daily. 90 tablet 2  . lisinopril (ZESTRIL) 20 MG tablet Take 1 tablet by mouth daily.    . metFORMIN (GLUCOPHAGE) 1000 MG tablet Take 1,000 mg by mouth 2 (two) times daily.    . prochlorperazine (COMPAZINE) 10 MG tablet Take 1 tablet (10 mg total) by mouth every 6 (six) hours as needed for nausea or vomiting. 30 tablet 0  . sitaGLIPtin (JANUVIA) 100 MG tablet Take 100 mg by mouth daily.    . SUBOXONE 8-2 MG FILM  (Patient  not taking: Reported on 04/30/2020)    . traZODone (DESYREL) 50 MG tablet Take 1 tablet (50 mg total) by mouth at bedtime as needed for sleep. (Patient taking differently: Take 100 mg by mouth at bedtime as needed for sleep. ) 30 tablet 0   No current facility-administered medications for this visit.    REVIEW OF SYSTEMS:   Constitutional: ( - ) fevers, ( - )  chills , ( - ) night sweats Eyes: ( - ) blurriness of vision, ( - ) double vision, ( - ) watery eyes Ears, nose, mouth, throat, and face: ( - ) mucositis, ( - ) sore throat Respiratory: ( - ) cough, ( - ) dyspnea, ( - ) wheezes Cardiovascular: ( - ) palpitation, ( - ) chest discomfort, ( - ) lower extremity swelling Gastrointestinal:  ( - ) nausea, ( - ) heartburn, ( - ) change in bowel habits Skin: ( - ) abnormal skin rashes Lymphatics: ( - ) new lymphadenopathy, ( - ) easy bruising Neurological: ( - ) numbness, ( - ) tingling, ( - ) new weaknesses Behavioral/Psych: ( - ) mood change, ( - ) new changes  All other systems were reviewed with the patient and are negative.  PHYSICAL EXAMINATION: ECOG PERFORMANCE STATUS: 1 - Symptomatic but completely ambulatory  Vitals:   04/30/20 1121  BP: 131/86  Pulse: 64  Resp: 18  Temp: 97.9 F (36.6 C)  SpO2: 100%   Filed Weights   04/30/20 1121  Weight: 203 lb 3.2 oz (92.2 kg)    GENERAL: well appearing middle aged Caucasian alert, no distress and  comfortable SKIN: skin color, texture, turgor are normal, no rashes or significant lesions EYES: conjunctiva are pink and non-injected, sclera clear OROPHARYNX: no exudate, no erythema; lips, buccal mucosa, and tongue normal. Mild oozing of blood from right lower molar site.  LUNGS: clear to auscultation and percussion with normal breathing effort HEART: regular rate & rhythm and no murmurs and no lower extremity edema Musculoskeletal: no cyanosis of digits and no clubbing  PSYCH: alert & oriented x 3, fluent speech NEURO: no focal motor/sensory deficits  LABORATORY DATA:  I have reviewed the data as listed CBC Latest Ref Rng & Units 04/30/2020 09/21/2019 09/20/2019  WBC 4.0 - 10.5 K/uL 2.8(L) 1.9(L) 3.4(L)  Hemoglobin 13.0 - 17.0 g/dL 12.1(L) 11.1(L) 11.8(L)  Hematocrit 39 - 52 % 34.9(L) 32.9(L) 34.6(L)  Platelets 150 - 400 K/uL 46(L) 33(L) 35(L)    CMP Latest Ref Rng & Units 04/30/2020 12/08/2019 09/21/2019  Glucose 70 - 99 mg/dL 625(W) 90 389(H)  BUN 6 - 20 mg/dL 18 23 19   Creatinine 0.61 - 1.24 mg/dL 7.34 2.87  Sodium 135 - 145 mmol/L 140 136 134(L)  Potassium 3.5 - 5.1 mmol/L 4.2 4.4 3.8  Chloride 98 - 111 mmol/L 102 102 99  CO2 22 - 32 mmol/L 28 25 26   Calcium 8.9 - 10.3 mg/dL 9.6 9.3 9.1  Total Protein 6.5 - 8.1 g/dL 8.0 7.9 7.6  Total Bilirubin 0.3 - 1.2 mg/dL 0.5 0.7 0.5  Alkaline Phos 38 - 126 U/L 90 - -  AST 15 - 41 U/L 19 17 30   ALT 0 - 44 U/L 17 11 25     RADIOGRAPHIC STUDIES: No results found.  ASSESSMENT & PLAN Chase Reed 54 y.o. male with medical history significant for cirrhosis 2/2 to Hep C, DM type II, and polysubstance abuse who presents for follow up to evaluate of longstanding pancytopenia.  Pancytopenia in  the setting of cirrhosis is common, particularly when splenomegaly is present as well ( Can J Gastroenterol. 2009;23(6):441-445.). This patient has marked splenomegaly, with a spleen measuring 21.4 cm and containing 1L of volume. In this case the  body is sequestering cells of all types in the spleen, causing the apparent circulating value to appear quite low. Additionally the liver produces thrombopoietin, which stimulates the marrow to produce platelets. When the liver functions is impaired thrombopoietin decreases, resulting in decreased platelets levels. Unfortunately there is no definitive treatment for splenomegaly causes by cirrhosis (due to increased portal pressure). TIPS and beta blockers to decrease portal pressure have been shown to help in some cases, however cytopenias alone are not an indication for the procedure.   During his last visit the patient had an extensive workup including an attritional evaluation, review the peripheral blood film, and viral work-up revealed no other possible etiologies for the patient's pancytopenia.  In the interim since her last visit the patient has been treated by GI for his hepatitis C and is beginning to see a slow improvement in his blood counts.  On exam today his white blood cell count is increasing and his platelet count is above its nadir previously seen.  As such I think we have the patient return in approximately 6 months time to continue to evaluate.  In the event the patient was in a car procedure is having issues with bleeding we would happily see him back for the consideration of TPO mimetic therapy.  #Pancytopenia in the Setting of Cirrhosis -- counts appear to be modestly improving following Hep C treatment  --HIV negative, known Hep C positive with cirrhosis -- prior review of peripheral blood film shows markedly decrease WBC and Plt, but normal morphology and no clumping.  --the patient's pancytopenia is most consistent with massive splenomegaly 2/2 to cirrhosis. Splenomegaly (substantial, noted at 1L of volume (nml 250cc) and 21.4 cm)) in Feb 2020.  -- in the event his Plt count were to drop <50 and he were to require a procedure we can potentially treat with avatrombopag to help  elevate these levels.  --RTC in 6 months to reassess blood counts.   #Oral Bleed --if bleed is persistent can prescribe tranexamic acid 650mg  PO tablet dissolved in water as an antifibrinolytic solution.   #Cirrhosis 2/2 to Hepatitis C --patient of GI at [facility]  --patient has completed  --defer management of cirrhosis and Hep C GI center  #IV Drug Use #Mental Health --currently using, d/c suboxone clinic --encourage patient to try to get clean again.  --continue to monitor  No orders of the defined types were placed in this encounter.   All questions were answered. The patient knows to call the clinic with any problems, questions or concerns.  A total of more than 30 minutes were spent on this encounter and over half of that time was spent on counseling and coordination of care as outlined above.   [facility], MD Department of Hematology/Oncology [facility] Phone: [phone] Pager: [phone] Email: [phone]  05/01/2020 4:54 PM

## 2020-04-30 ENCOUNTER — Other Ambulatory Visit: Payer: Self-pay | Admitting: Hematology and Oncology

## 2020-04-30 ENCOUNTER — Other Ambulatory Visit: Payer: Self-pay

## 2020-04-30 ENCOUNTER — Inpatient Hospital Stay: Payer: Medicaid Other

## 2020-04-30 ENCOUNTER — Inpatient Hospital Stay: Payer: Medicaid Other | Attending: Hematology and Oncology | Admitting: Hematology and Oncology

## 2020-04-30 VITALS — BP 131/86 | HR 64 | Temp 97.9°F | Resp 18 | Ht 72.0 in | Wt 203.2 lb

## 2020-04-30 DIAGNOSIS — F17210 Nicotine dependence, cigarettes, uncomplicated: Secondary | ICD-10-CM | POA: Diagnosis not present

## 2020-04-30 DIAGNOSIS — B182 Chronic viral hepatitis C: Secondary | ICD-10-CM

## 2020-04-30 DIAGNOSIS — D61818 Other pancytopenia: Secondary | ICD-10-CM | POA: Diagnosis not present

## 2020-04-30 DIAGNOSIS — E119 Type 2 diabetes mellitus without complications: Secondary | ICD-10-CM | POA: Insufficient documentation

## 2020-04-30 DIAGNOSIS — K7460 Unspecified cirrhosis of liver: Secondary | ICD-10-CM | POA: Insufficient documentation

## 2020-04-30 DIAGNOSIS — G8929 Other chronic pain: Secondary | ICD-10-CM | POA: Insufficient documentation

## 2020-04-30 DIAGNOSIS — K7469 Other cirrhosis of liver: Secondary | ICD-10-CM | POA: Diagnosis not present

## 2020-04-30 DIAGNOSIS — Z7982 Long term (current) use of aspirin: Secondary | ICD-10-CM | POA: Insufficient documentation

## 2020-04-30 DIAGNOSIS — D703 Neutropenia due to infection: Secondary | ICD-10-CM

## 2020-04-30 DIAGNOSIS — D696 Thrombocytopenia, unspecified: Secondary | ICD-10-CM

## 2020-04-30 DIAGNOSIS — B1920 Unspecified viral hepatitis C without hepatic coma: Secondary | ICD-10-CM | POA: Diagnosis not present

## 2020-04-30 DIAGNOSIS — Z794 Long term (current) use of insulin: Secondary | ICD-10-CM | POA: Insufficient documentation

## 2020-04-30 DIAGNOSIS — Z79899 Other long term (current) drug therapy: Secondary | ICD-10-CM | POA: Insufficient documentation

## 2020-04-30 LAB — CMP (CANCER CENTER ONLY)
ALT: 17 U/L (ref 0–44)
AST: 19 U/L (ref 15–41)
Albumin: 4.2 g/dL (ref 3.5–5.0)
Alkaline Phosphatase: 90 U/L (ref 38–126)
Anion gap: 10 (ref 5–15)
BUN: 18 mg/dL (ref 6–20)
CO2: 28 mmol/L (ref 22–32)
Calcium: 9.6 mg/dL (ref 8.9–10.3)
Chloride: 102 mmol/L (ref 98–111)
Creatinine: 1.06 mg/dL (ref 0.61–1.24)
GFR, Est AFR Am: >60 mL/min (ref >60)
GFR, Estimated: >60 mL/min (ref >60)
Glucose, Bld: 105 mg/dL — ABNORMAL HIGH (ref 70–99)
Potassium: 4.2 mmol/L (ref 3.5–5.1)
Sodium: 140 mmol/L (ref 135–145)
Total Bilirubin: 0.5 mg/dL (ref 0.3–1.2)
Total Protein: 8 g/dL (ref 6.5–8.1)

## 2020-04-30 LAB — CBC WITH DIFFERENTIAL (CANCER CENTER ONLY)
Abs Immature Granulocytes: 0.01 10*3/uL (ref 0.00–0.07)
Basophils Absolute: 0 10*3/uL (ref 0.0–0.1)
Basophils Relative: 1 %
Eosinophils Absolute: 0.1 10*3/uL (ref 0.0–0.5)
Eosinophils Relative: 4 %
HCT: 34.9 % — ABNORMAL LOW (ref 39.0–52.0)
Hemoglobin: 12.1 g/dL — ABNORMAL LOW (ref 13.0–17.0)
Immature Granulocytes: 0 %
Lymphocytes Relative: 24 %
Lymphs Abs: 0.7 10*3/uL (ref 0.7–4.0)
MCH: 31 pg (ref 26.0–34.0)
MCHC: 34.7 g/dL (ref 30.0–36.0)
MCV: 89.5 fL (ref 80.0–100.0)
Monocytes Absolute: 0.3 10*3/uL (ref 0.1–1.0)
Monocytes Relative: 9 %
Neutro Abs: 1.7 10*3/uL (ref 1.7–7.7)
Neutrophils Relative %: 62 %
Platelet Count: 46 10*3/uL — ABNORMAL LOW (ref 150–400)
RBC: 3.9 MIL/uL — ABNORMAL LOW (ref 4.22–5.81)
RDW: 14.1 % (ref 11.5–15.5)
WBC Count: 2.8 10*3/uL — ABNORMAL LOW (ref 4.0–10.5)
nRBC: 0 % (ref 0.0–0.2)

## 2020-04-30 LAB — SAVE SMEAR(SSMR), FOR PROVIDER SLIDE REVIEW

## 2020-05-01 ENCOUNTER — Telehealth: Payer: Self-pay

## 2020-05-01 ENCOUNTER — Encounter: Payer: Self-pay | Admitting: Hematology and Oncology

## 2020-05-01 NOTE — Telephone Encounter (Signed)
Received Voicemail from patient stating that DrDorsey requested for him to let him know about which oral rinse medication he was taking because you wanted to see if the rinse had the blood clotter you thought that he needed in it,and if it did not he was going to call in one for him. He is take Chlorexidine glucanate 0.12%. oral rinse. Sent staff message to Dr Leonides Schanz to make him aware and cc: his nurse Beth RN aswell.

## 2020-05-02 ENCOUNTER — Telehealth: Payer: Self-pay | Admitting: Hematology and Oncology

## 2020-05-02 NOTE — Telephone Encounter (Signed)
Schedule per los. Called and left msg. Mailed printout  

## 2020-05-03 ENCOUNTER — Other Ambulatory Visit: Payer: Self-pay

## 2020-05-03 ENCOUNTER — Ambulatory Visit (INDEPENDENT_AMBULATORY_CARE_PROVIDER_SITE_OTHER): Payer: Medicaid Other | Admitting: Clinical

## 2020-05-03 DIAGNOSIS — F331 Major depressive disorder, recurrent, moderate: Secondary | ICD-10-CM | POA: Diagnosis not present

## 2020-05-03 DIAGNOSIS — F1120 Opioid dependence, uncomplicated: Secondary | ICD-10-CM

## 2020-05-04 ENCOUNTER — Telehealth: Payer: Self-pay | Admitting: *Deleted

## 2020-05-04 ENCOUNTER — Ambulatory Visit: Payer: Medicaid Other | Attending: Internal Medicine

## 2020-05-04 DIAGNOSIS — F331 Major depressive disorder, recurrent, moderate: Secondary | ICD-10-CM | POA: Insufficient documentation

## 2020-05-04 DIAGNOSIS — Z23 Encounter for immunization: Secondary | ICD-10-CM
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
## 2020-05-04 NOTE — Telephone Encounter (Signed)
TCT patient regarding bleeding s/p dental procedures.  No answer and unable to leave vm message.

## 2020-05-04 NOTE — Progress Notes (Signed)
   Covid-19 Vaccination Clinic  Name:  Chase Reed    MRN: [id number] DOB: [birth year]  05/04/2020  Chase Reed was observed post Covid-19 immunization for 15 minutes without incident. He was provided with Vaccine Information Sheet and instruction to access the V-Safe system.   Chase Reed was instructed to call 911 with any severe reactions post vaccine: Marland Kitchen Difficulty breathing  . Swelling of face and throat  . A fast heartbeat  . A bad rash all over body  . Dizziness and weakness   Immunizations Administered    Name Date Dose VIS Date Route   Moderna COVID-19 Vaccine 05/04/2020 11:04 AM 0.5 mL 10/2019 Intramuscular   Manufacturer: Moderna   Lot: [id number]   NDC: 02774-128-78

## 2020-05-23 ENCOUNTER — Encounter (HOSPITAL_COMMUNITY): Payer: Self-pay | Admitting: Psychiatry

## 2020-05-23 ENCOUNTER — Ambulatory Visit (INDEPENDENT_AMBULATORY_CARE_PROVIDER_SITE_OTHER): Payer: Medicaid Other | Admitting: Psychiatry

## 2020-05-23 ENCOUNTER — Other Ambulatory Visit: Payer: Self-pay

## 2020-05-23 DIAGNOSIS — F333 Major depressive disorder, recurrent, severe with psychotic symptoms: Secondary | ICD-10-CM

## 2020-05-23 DIAGNOSIS — R41840 Attention and concentration deficit: Secondary | ICD-10-CM

## 2020-05-23 MED ORDER — TRAZODONE HCL 50 MG PO TABS: 100.0000 mg | ORAL_TABLET | Freq: Every evening | ORAL | 1 refills | Status: DC | PRN

## 2020-05-23 MED ORDER — DULOXETINE HCL 60 MG PO CPEP: 60.0000 mg | ORAL_CAPSULE | Freq: Every day | ORAL | 1 refills | Status: DC

## 2020-05-23 MED ORDER — QUETIAPINE FUMARATE 100 MG PO TABS
100.0000 mg | ORAL_TABLET | Freq: Two times a day (BID) | ORAL | 1 refills | Status: DC
Start: 2020-05-23 — End: 2020-05-23

## 2020-05-23 MED ORDER — QUETIAPINE FUMARATE 100 MG PO TABS: 100.0000 mg | ORAL_TABLET | Freq: Every day | ORAL | 1 refills | Status: DC

## 2020-05-23 MED ORDER — ATOMOXETINE HCL 40 MG PO CAPS: 40.0000 mg | ORAL_CAPSULE | Freq: Every day | ORAL | 1 refills | Status: DC
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
## 2020-05-25 ENCOUNTER — Telehealth: Payer: Self-pay | Admitting: Hematology and Oncology

## 2020-05-25 NOTE — Telephone Encounter (Signed)
Rescheduled appointments per 7/12 provider message. Left message on patient voicemail with updated appointment date and time.

## 2020-06-01 ENCOUNTER — Ambulatory Visit: Payer: Self-pay

## 2020-06-04 DIAGNOSIS — E1142 Type 2 diabetes mellitus with diabetic polyneuropathy: Secondary | ICD-10-CM | POA: Insufficient documentation

## 2020-06-05 ENCOUNTER — Ambulatory Visit: Payer: Medicaid Other | Attending: Internal Medicine

## 2020-06-05 DIAGNOSIS — Z23 Encounter for immunization: Secondary | ICD-10-CM

## 2020-06-05 NOTE — Progress Notes (Signed)
° °  Covid-19 Vaccination Clinic  Name:  [name]    MRN: [id number] DOB: 12/22/1965  06/05/2020  Chase Reed was observed post Covid-19 immunization for 15 minutes without incident. He was provided with Vaccine Information Sheet and instruction to access the V-Safe system.   Chase Reed was instructed to call 911 with any severe reactions post vaccine:  Difficulty breathing   Swelling of face and throat   A fast heartbeat   A bad rash all over body   Dizziness and weakness   Immunizations Administered    Name Date Dose VIS Date Route   Moderna COVID-19 Vaccine 06/05/2020 12:33 PM 0.5 mL 10/2019 Intramuscular   Manufacturer: Moderna   Lot: [id number]   NDC: 70488-891-69

## 2020-07-10 ENCOUNTER — Other Ambulatory Visit (HOSPITAL_COMMUNITY): Payer: Self-pay | Admitting: Psychiatry

## 2020-07-10 DIAGNOSIS — R41840 Attention and concentration deficit: Secondary | ICD-10-CM

## 2020-07-18 ENCOUNTER — Ambulatory Visit: Payer: Medicaid Other | Admitting: Internal Medicine

## 2020-07-25 ENCOUNTER — Telehealth (INDEPENDENT_AMBULATORY_CARE_PROVIDER_SITE_OTHER): Payer: Medicaid Other | Admitting: Psychiatry

## 2020-07-25 ENCOUNTER — Other Ambulatory Visit: Payer: Self-pay

## 2020-07-25 ENCOUNTER — Encounter (HOSPITAL_COMMUNITY): Payer: Self-pay | Admitting: Psychiatry

## 2020-07-25 DIAGNOSIS — R41840 Attention and concentration deficit: Secondary | ICD-10-CM

## 2020-07-25 DIAGNOSIS — F333 Major depressive disorder, recurrent, severe with psychotic symptoms: Secondary | ICD-10-CM

## 2020-07-25 MED ORDER — DULOXETINE HCL 60 MG PO CPEP: 60.0000 mg | ORAL_CAPSULE | Freq: Every day | ORAL | 2 refills | Status: DC

## 2020-07-25 MED ORDER — TRAZODONE HCL 50 MG PO TABS: 100.0000 mg | ORAL_TABLET | Freq: Every evening | ORAL | 2 refills | Status: DC | PRN

## 2020-07-25 MED ORDER — RISPERIDONE 2 MG PO TABS: 2.0000 mg | ORAL_TABLET | Freq: Every day | ORAL | 2 refills | Status: DC

## 2020-07-25 MED ORDER — ATOMOXETINE HCL 80 MG PO CAPS: 80.0000 mg | ORAL_CAPSULE | Freq: Every day | ORAL | 2 refills | Status: DC

## 2020-07-25 NOTE — Progress Notes (Signed)
[facility] MD/PA/NP OP Progress Note Virtual Visit via Telephone Note  I connected with Chase Reed on 07/25/20 at  1:00 PM EDT by telephone and verified that I am speaking with the correct person using two identifiers.  Location: Patient: home Provider: Clinic   I discussed the limitations, risks, security and privacy concerns of performing an evaluation and management service by telephone and the availability of in person appointments. I also discussed with the patient that there may be a patient responsible charge related to this service. The patient expressed understanding and agreed to proceed.   I provided 30 minutes of non-face-to-face time during this encounter.    07/25/2020 1:34 PM MYNOR WITKOP  MRN:  [id number]  Chief Complaint: " I am tired of being miserable"  HPI: 54 year old male in today for follow up psychiatric evaluation.    He has a psychiatric history of substance-induced mood disorder, substance induced psychotic disorders with hallucinations, opioid use disorder, and depression. He is currently managed on duloxetine 60 mg daily, trazodone 50 mg, and gabapentin 600 mg three times daily (precribed by PCP for neuropathy), Strattera 40 mg daily, and Seroquel 100 mg at bedtime.  Patient notes that he never received his Seroquel and notes that he has been experiencing VAH.  Today patient is irritable, engaged in conversation, and talkative.  He notes that he has been feeling depressed and having problems concentrating.  He informed provider that he never received his Seroquel and is still having visual auditory hallucinations.  He also reports that he is having difficulty concentrating.  He notes that he increased his Strattera dose from 40 mg to 80 mg however he is now out of the medication.  Patient informed provider that Vyvanse and Adderall were effective in the past and notes that he would like to be restarted on these medications.  Provider informed patient that at this  time these medications would not be prescribed however informed him that at a later date the medications could be prescribed.  Patient became frustrated and notes that he may need a second opinion as he feels he is being judged.  Provider informed patient that she was not judging him however notes that she would like to try a nonstimulant prior to restarting a stimulant.  He endorsed understanding however disagreed.    Patient informed Clinical research associate that he deals with chronic pain due to neuropathy and orthopedic conditions.  He notes that he continues to take takes prescription Xanax and opioids that are not prescribed to him.    He informed provider that his doctors are sympathetic to his pain however notes that they will not prescribe him narcotics or other pain medications.  He notes that his providers instructed him to take nonprescription medications safely and encourage him not to get it off the streets.   Patient notes that he never received his Seroquel and reports that he does not want to take it as it because it caused him to feel overly sedated in the past.  He also notes that Abilify caused his friend to feel restless and notes that he does not want to take that medication either.  He is agreeable to starting Risperdal 2 mg daily to help manage symptoms of psychosis.  He is agreeable to increasing Strattera 40 mg to 80 mg to help manage concentration.  He will continue all other medications as prescribed.  Potential side effects of medication and risks vs benefits of treatment vs non-treatment were explained and discussed. All questions  were answered. He will continue all other medications as prescribed.  No other concerns noted at this time.  Visit Diagnosis:    ICD-10-CM   1. Attention and concentration deficit  R41.840 atomoxetine (STRATTERA) 80 MG capsule  2. Severe episode of recurrent major depressive disorder, with psychotic features (HCC)  F33.3 DULoxetine (CYMBALTA) 60 MG capsule     traZODone (DESYREL) 50 MG tablet    risperiDONE (RISPERDAL) 2 MG tablet    Past Psychiatric History:  He has a psychiatric history of substance-induced mood disorder, substance induced psychotic disorders with hallucinations, opioid use disorder, and depression.  Past Medical History:  Past Medical History:  Diagnosis Date  . Cirrhosis (HCC)   . Diabetes mellitus without complication (HCC)   . Hepatitis     Past Surgical History:  Procedure Laterality Date  . APPLICATION OF WOUND VAC N/A 12/24/2018   Procedure: APPLICATION OF WOUND VAC;  Surgeon: Alleen Borne, MD;  Location: [facility];  Service: Thoracic;  Laterality: N/A;  . APPLICATION OF WOUND VAC N/A 12/27/2018   Procedure: WOUND VAC CHANGE;  Surgeon: Alleen Borne, MD;  Location: [facility] OR;  Service: Thoracic;  Laterality: N/A;  . APPLICATION OF WOUND VAC N/A 01/14/2019   Procedure: APPLICATION OF WOUND VAC;  Surgeon: Alleen Borne, MD;  Location: [facility] OR;  Service: Vascular;  Laterality: N/A;  . I & D EXTREMITY N/A 01/14/2019   Procedure: DEBRIDEMENT OF STERNOCLAVICULAR JOINT;  Surgeon: Alleen Borne, MD;  Location: [facility] OR;  Service: Vascular;  Laterality: N/A;  . LITHOTRIPSY    . STERNAL WOUND DEBRIDEMENT N/A 12/24/2018   Procedure: I&D CHEST WALL ABSCESS;  Surgeon: Alleen Borne, MD;  Location: [facility];  Service: Thoracic;  Laterality: N/A;  . STERNAL WOUND DEBRIDEMENT N/A 12/27/2018   Procedure: DEBRIDEMENT OF CHEST WALL WOUND;  Surgeon: Alleen Borne, MD;  Location: [facility];  Service: Thoracic;  Laterality: N/A;  . TEE WITHOUT CARDIOVERSION N/A 01/03/2019   Procedure: TRANSESOPHAGEAL ECHOCARDIOGRAM (TEE);  Surgeon: Thurmon Fair, MD;  Location: [facility];  Service: Cardiovascular;  Laterality: N/A;    Family Psychiatric History: Unknown  Family History:  Family History  Problem Relation Age of Onset  . COPD Father     Social History:  Social History   Socioeconomic History  . Marital status: Married    Spouse name:  Not on file  . Number of children: Not on file  . Years of education: Not on file  . Highest education level: Not on file  Occupational History  . Occupation: Software engineer  Tobacco Use  . Smoking status: Current Some Day Smoker    Packs/day: 0.25    Years: 30.00    Pack years: 7.50    Types: Cigarettes  . Smokeless tobacco: Former Clinical biochemist  . Vaping Use: Never used  Substance and Sexual Activity  . Alcohol use: No  . Drug use: Yes    Types: Heroin, IV    Comment: Fentanyl and "Oxy" per patient   . Sexual activity: Never  Other Topics Concern  . Not on file  Social History Narrative  . Not on file   Social Determinants of Health   Financial Resource Strain:   . Difficulty of Paying Living Expenses: Not on file  Food Insecurity:   . Worried About Programme researcher, broadcasting/film/video in the Last Year: Not on file  . Ran Out of Food in the Last Year: Not on file  Transportation Needs:   .  Lack of Transportation (Medical): Not on file  . Lack of Transportation (Non-Medical): Not on file  Physical Activity:   . Days of Exercise per Week: Not on file  . Minutes of Exercise per Session: Not on file  Stress:   . Feeling of Stress : Not on file  Social Connections:   . Frequency of Communication with Friends and Family: Not on file  . Frequency of Social Gatherings with Friends and Family: Not on file  . Attends Religious Services: Not on file  . Active Member of Clubs or Organizations: Not on file  . Attends Banker Meetings: Not on file  . Marital Status: Not on file    Allergies:  Allergies  Allergen Reactions  . Atorvastatin Other (See Comments)    Leg cramps and body aches    Metabolic Disorder Labs: Lab Results  Component Value Date   HGBA1C 7.5 (H) 12/24/2018   MPG 168.55 12/24/2018   MPG 183 12/22/2016   No results found for: PROLACTIN No results found for: CHOL, TRIG, HDL, CHOLHDL, VLDL, LDLCALC Lab Results  Component Value Date   TSH 1.957  09/20/2019    Therapeutic Level Labs: No results found for: LITHIUM No results found for: VALPROATE No components found for:  CBMZ  Current Medications: Current Outpatient Medications  Medication Sig Dispense Refill  . aspirin EC 81 MG EC tablet Take 1 tablet (81 mg total) by mouth daily.    Marland Kitchen atomoxetine (STRATTERA) 80 MG capsule Take 1 capsule (80 mg total) by mouth daily. 30 capsule 2  . cloNIDine (CATAPRES) 0.1 MG tablet Take 1 tablet by mouth daily.    . DULoxetine (CYMBALTA) 60 MG capsule Take 1 capsule (60 mg total) by mouth daily. 30 capsule 2  . gabapentin (NEURONTIN) 600 MG tablet take 1 tablet by mouth THREE TIMES DAILY    . insulin aspart (NOVOLOG) 100 UNIT/ML injection Inject 2-8 Units into the skin See admin instructions. Sliding scale.over 250--2 units over 300--4 units over 350--6 units over 400--8 units Up to 50 units daily: For diabetes management 10 mL 0  . insulin glargine (LANTUS) 100 UNIT/ML injection Inject 30 Units into the skin daily.     . Iron, Ferrous Sulfate, 325 (65 Fe) MG TABS Take 325 mg by mouth daily. 90 tablet 2  . lisinopril (ZESTRIL) 20 MG tablet Take 1 tablet by mouth daily.    . metFORMIN (GLUCOPHAGE) 1000 MG tablet Take 1,000 mg by mouth 2 (two) times daily.    . prochlorperazine (COMPAZINE) 10 MG tablet Take 1 tablet (10 mg total) by mouth every 6 (six) hours as needed for nausea or vomiting. 30 tablet 0  . risperiDONE (RISPERDAL) 2 MG tablet Take 1 tablet (2 mg total) by mouth at bedtime. 30 tablet 2  . sitaGLIPtin (JANUVIA) 100 MG tablet Take 100 mg by mouth daily.    . SUBOXONE 8-2 MG FILM  (Patient not taking: Reported on 04/30/2020)    . traZODone (DESYREL) 50 MG tablet Take 2 tablets (100 mg total) by mouth at bedtime as needed for sleep. 30 tablet 2   No current facility-administered medications for this visit.     Musculoskeletal: Strength & Muscle Tone: Unable to assess. Patient unable to log in virtually Gait & Station: Unable to  assess. Patient unable to log in virtually Patient leans: N/A  Psychiatric Specialty Exam: Review of Systems  There were no vitals taken for this visit.There is no height or weight on file to calculate  BMI.  General Appearance: Unable to assess. Patient unable to log in virtually  Eye Contact:  Unable to assess. Patient unable to log in virtually  Speech:  Clear and Coherent and Normal Rate  Volume:  Normal  Mood:  Angry and Irritable  Affect:  Congruent  Thought Process:  Coherent, Goal Directed and Linear  Orientation:  Full (Time, Place, and Person)  Thought Content: Logical and Hallucinations: Auditory Visual   Suicidal Thoughts:  No  Homicidal Thoughts:  No  Memory:  Immediate;   Good Recent;   Good Remote;   Good  Judgement:  Fair  Insight:  Fair  Psychomotor Activity:  Normal  Concentration:  Concentration: Fair and Attention Span: Fair  Recall:  Good  Fund of Knowledge: Good  Language: Good  Akathisia:  No  Handed:  Right  AIMS (if indicated):Not done  Assets:  Communication Skills Desire for Improvement Housing Social Support  ADL's:  Intact  Cognition: WNL  Sleep:  Good   Screenings: AIMS     [facility] (Dis[facility]) [facility] Admission (Discharged[facility] from [facility]  AIMS Total Score 0 0    AUDIT     Admission (Discharged) [facility]  Alcohol Use Disorder Identification Test Final Score (AUDIT) 0    GAD-7     [facility]  Total GAD-7 Score 14    PHQ2-9     [facility]  PHQ-2 Total Score 4 0 6   PHQ-9 Total Score 16 -- --       Assessment and Plan: Patient endorses symptoms of depression and poor concentration.  He is agreeable to starting Risperdal 2 mg daily to help manage symptoms of psychosis.  He is agreeable to increasing Strattera 40 mg to 80 mg to help manage concentration.  He will continue all other medications as prescribed.  1. Attention and concentration deficit  Increased- atomoxetine (STRATTERA) 80 MG capsule; Take 1 capsule (80 mg total) by mouth daily.  Dispense: 30 capsule; Refill: 2  2. Severe episode of recurrent major depressive disorder, with psychotic features (HCC)  Continue- DULoxetine (CYMBALTA) 60 MG capsule; Take 1 capsule (60 mg total) by mouth daily.  Dispense: 30 capsule; Refill: 2 Continue- traZODone (DESYREL) 50 MG tablet; Take 2 tablets (100 mg total) by mouth at bedtime as needed for sleep.  Dispense: 30 tablet; Refill: 2 Start- risperiDONE (RISPERDAL) 2 MG tablet; Take 1 tablet (2 mg total) by mouth at bedtime.  Dispense: 30 tablet; Refill: 2   Follow-up in 2 months  Shanna CiscoBrittney E Breindel Collier, NP 07/25/2020, 1:34 PM

## 2020-08-13 ENCOUNTER — Ambulatory Visit: Payer: Medicaid Other | Admitting: Internal Medicine

## 2020-09-04 ENCOUNTER — Other Ambulatory Visit (HOSPITAL_COMMUNITY): Payer: Self-pay | Admitting: Psychiatry

## 2020-09-04 DIAGNOSIS — F333 Major depressive disorder, recurrent, severe with psychotic symptoms: Secondary | ICD-10-CM

## 2020-09-24 ENCOUNTER — Encounter (HOSPITAL_COMMUNITY): Payer: Medicaid Other | Admitting: Psychiatry

## 2020-09-24 ENCOUNTER — Other Ambulatory Visit (HOSPITAL_COMMUNITY): Payer: Self-pay | Admitting: Psychiatry

## 2020-09-24 ENCOUNTER — Ambulatory Visit: Payer: Medicaid Other | Admitting: Internal Medicine

## 2020-09-24 DIAGNOSIS — F333 Major depressive disorder, recurrent, severe with psychotic symptoms: Secondary | ICD-10-CM

## 2020-10-02 ENCOUNTER — Inpatient Hospital Stay (HOSPITAL_COMMUNITY)
Admission: EM | Admit: 2020-10-02 | Discharge: 2020-10-09 | DRG: 580 | Disposition: A | Discharge status: Home / Self Care | Payer: Medicaid Other | Attending: Internal Medicine | Admitting: Internal Medicine

## 2020-10-02 ENCOUNTER — Encounter (HOSPITAL_COMMUNITY): Payer: Self-pay | Admitting: Emergency Medicine

## 2020-10-02 ENCOUNTER — Other Ambulatory Visit: Payer: Self-pay

## 2020-10-02 DIAGNOSIS — E1140 Type 2 diabetes mellitus with diabetic neuropathy, unspecified: Secondary | ICD-10-CM | POA: Diagnosis present

## 2020-10-02 DIAGNOSIS — I10 Essential (primary) hypertension: Secondary | ICD-10-CM | POA: Diagnosis present

## 2020-10-02 DIAGNOSIS — F17210 Nicotine dependence, cigarettes, uncomplicated: Secondary | ICD-10-CM | POA: Diagnosis present

## 2020-10-02 DIAGNOSIS — Z79899 Other long term (current) drug therapy: Secondary | ICD-10-CM

## 2020-10-02 DIAGNOSIS — B182 Chronic viral hepatitis C: Secondary | ICD-10-CM | POA: Diagnosis present

## 2020-10-02 DIAGNOSIS — L0390 Cellulitis, unspecified: Secondary | ICD-10-CM | POA: Diagnosis present

## 2020-10-02 DIAGNOSIS — E1169 Type 2 diabetes mellitus with other specified complication: Secondary | ICD-10-CM

## 2020-10-02 DIAGNOSIS — E1165 Type 2 diabetes mellitus with hyperglycemia: Secondary | ICD-10-CM | POA: Diagnosis present

## 2020-10-02 DIAGNOSIS — E119 Type 2 diabetes mellitus without complications: Secondary | ICD-10-CM

## 2020-10-02 DIAGNOSIS — E1142 Type 2 diabetes mellitus with diabetic polyneuropathy: Secondary | ICD-10-CM

## 2020-10-02 DIAGNOSIS — F1123 Opioid dependence with withdrawal: Secondary | ICD-10-CM | POA: Diagnosis present

## 2020-10-02 DIAGNOSIS — F17200 Nicotine dependence, unspecified, uncomplicated: Secondary | ICD-10-CM | POA: Diagnosis present

## 2020-10-02 DIAGNOSIS — Z794 Long term (current) use of insulin: Secondary | ICD-10-CM

## 2020-10-02 DIAGNOSIS — R0989 Other specified symptoms and signs involving the circulatory and respiratory systems: Secondary | ICD-10-CM

## 2020-10-02 DIAGNOSIS — D61818 Other pancytopenia: Secondary | ICD-10-CM | POA: Diagnosis present

## 2020-10-02 DIAGNOSIS — Z560 Unemployment, unspecified: Secondary | ICD-10-CM

## 2020-10-02 DIAGNOSIS — Z7982 Long term (current) use of aspirin: Secondary | ICD-10-CM

## 2020-10-02 DIAGNOSIS — K7460 Unspecified cirrhosis of liver: Secondary | ICD-10-CM | POA: Diagnosis present

## 2020-10-02 DIAGNOSIS — R161 Splenomegaly, not elsewhere classified: Secondary | ICD-10-CM | POA: Diagnosis present

## 2020-10-02 DIAGNOSIS — Z888 Allergy status to other drugs, medicaments and biological substances status: Secondary | ICD-10-CM

## 2020-10-02 DIAGNOSIS — Z7984 Long term (current) use of oral hypoglycemic drugs: Secondary | ICD-10-CM

## 2020-10-02 DIAGNOSIS — L03113 Cellulitis of right upper limb: Principal | ICD-10-CM | POA: Diagnosis present

## 2020-10-02 DIAGNOSIS — Z20822 Contact with and (suspected) exposure to covid-19: Secondary | ICD-10-CM | POA: Diagnosis present

## 2020-10-02 DIAGNOSIS — L02413 Cutaneous abscess of right upper limb: Secondary | ICD-10-CM | POA: Diagnosis present

## 2020-10-02 DIAGNOSIS — B9561 Methicillin susceptible Staphylococcus aureus infection as the cause of diseases classified elsewhere: Secondary | ICD-10-CM | POA: Diagnosis present

## 2020-10-02 DIAGNOSIS — F1120 Opioid dependence, uncomplicated: Secondary | ICD-10-CM | POA: Diagnosis present

## 2020-10-02 LAB — CBC WITH DIFFERENTIAL/PLATELET
Abs Immature Granulocytes: 0.02 10*3/uL (ref 0.00–0.07)
Basophils Absolute: 0 10*3/uL (ref 0.0–0.1)
Basophils Relative: 0 %
Eosinophils Absolute: 0.1 10*3/uL (ref 0.0–0.5)
Eosinophils Relative: 1 %
HCT: 36.8 % — ABNORMAL LOW (ref 39.0–52.0)
Hemoglobin: 12.6 g/dL — ABNORMAL LOW (ref 13.0–17.0)
Immature Granulocytes: 0 %
Lymphocytes Relative: 14 %
Lymphs Abs: 0.6 10*3/uL — ABNORMAL LOW (ref 0.7–4.0)
MCH: 30.6 pg (ref 26.0–34.0)
MCHC: 34.2 g/dL (ref 30.0–36.0)
MCV: 89.3 fL (ref 80.0–100.0)
Monocytes Absolute: 0.4 10*3/uL (ref 0.1–1.0)
Monocytes Relative: 8 %
Neutro Abs: 3.4 10*3/uL (ref 1.7–7.7)
Neutrophils Relative %: 77 %
Platelets: 31 10*3/uL — ABNORMAL LOW (ref 150–400)
RBC: 4.12 MIL/uL — ABNORMAL LOW (ref 4.22–5.81)
RDW: 13.4 % (ref 11.5–15.5)
WBC: 4.5 10*3/uL (ref 4.0–10.5)
nRBC: 0 % (ref 0.0–0.2)

## 2020-10-02 LAB — URINALYSIS, ROUTINE W REFLEX MICROSCOPIC
Bilirubin Urine: NEGATIVE
Glucose, UA: 150 mg/dL — AB
Hgb urine dipstick: NEGATIVE
Ketones, ur: NEGATIVE mg/dL
Leukocytes,Ua: NEGATIVE
Nitrite: NEGATIVE
Protein, ur: NEGATIVE mg/dL
Specific Gravity, Urine: 1.021 (ref 1.005–1.030)
pH: 5 (ref 5.0–8.0)

## 2020-10-02 LAB — LACTIC ACID, PLASMA: Lactic Acid, Venous: 1.4 mmol/L (ref 0.5–1.9)

## 2020-10-02 NOTE — ED Triage Notes (Addendum)
Pt to ED with c/o infection to right arm  Pt st's he was shooting Heroin 2 days ago and missed the vein.  Pt's right arm is red and swollen  Pt st'[s he last used prior to coming to ED

## 2020-10-03 ENCOUNTER — Observation Stay (HOSPITAL_COMMUNITY): Payer: Medicaid Other

## 2020-10-03 ENCOUNTER — Observation Stay (HOSPITAL_COMMUNITY): Payer: Medicaid Other | Admitting: Anesthesiology

## 2020-10-03 ENCOUNTER — Encounter (HOSPITAL_COMMUNITY): Payer: Self-pay | Admitting: Internal Medicine

## 2020-10-03 ENCOUNTER — Encounter (HOSPITAL_COMMUNITY)
Admission: EM | Disposition: A | Discharge status: Home / Self Care | Payer: Self-pay | Source: Home / Self Care | Attending: Internal Medicine

## 2020-10-03 ENCOUNTER — Emergency Department (HOSPITAL_COMMUNITY): Payer: Medicaid Other

## 2020-10-03 ENCOUNTER — Other Ambulatory Visit (HOSPITAL_COMMUNITY): Payer: Self-pay | Admitting: Psychiatry

## 2020-10-03 DIAGNOSIS — I10 Essential (primary) hypertension: Secondary | ICD-10-CM | POA: Diagnosis present

## 2020-10-03 DIAGNOSIS — L03113 Cellulitis of right upper limb: Secondary | ICD-10-CM | POA: Diagnosis not present

## 2020-10-03 DIAGNOSIS — F17200 Nicotine dependence, unspecified, uncomplicated: Secondary | ICD-10-CM | POA: Diagnosis present

## 2020-10-03 DIAGNOSIS — E119 Type 2 diabetes mellitus without complications: Secondary | ICD-10-CM

## 2020-10-03 DIAGNOSIS — E1142 Type 2 diabetes mellitus with diabetic polyneuropathy: Secondary | ICD-10-CM

## 2020-10-03 DIAGNOSIS — R41840 Attention and concentration deficit: Secondary | ICD-10-CM

## 2020-10-03 DIAGNOSIS — L0390 Cellulitis, unspecified: Secondary | ICD-10-CM | POA: Diagnosis present

## 2020-10-03 DIAGNOSIS — D61818 Other pancytopenia: Secondary | ICD-10-CM

## 2020-10-03 DIAGNOSIS — F1120 Opioid dependence, uncomplicated: Secondary | ICD-10-CM | POA: Diagnosis not present

## 2020-10-03 HISTORY — PX: I & D EXTREMITY: SHX5045

## 2020-10-03 LAB — COMPREHENSIVE METABOLIC PANEL
ALT: 62 U/L — ABNORMAL HIGH (ref 0–44)
AST: 62 U/L — ABNORMAL HIGH (ref 15–41)
Albumin: 3.8 g/dL (ref 3.5–5.0)
Alkaline Phosphatase: 110 U/L (ref 38–126)
Anion gap: 11 (ref 5–15)
BUN: 16 mg/dL (ref 6–20)
CO2: 24 mmol/L (ref 22–32)
Calcium: 9.1 mg/dL (ref 8.9–10.3)
Chloride: 100 mmol/L (ref 98–111)
Creatinine, Ser: 1.14 mg/dL (ref 0.61–1.24)
GFR, Estimated: >60 mL/min (ref >60)
Glucose, Bld: 220 mg/dL — ABNORMAL HIGH (ref 70–99)
Potassium: 4.7 mmol/L (ref 3.5–5.1)
Sodium: 135 mmol/L (ref 135–145)
Total Bilirubin: 0.9 mg/dL (ref 0.3–1.2)
Total Protein: 7.5 g/dL (ref 6.5–8.1)

## 2020-10-03 LAB — RESP PANEL BY RT-PCR (FLU A&B, COVID) ARPGX2
Influenza A by PCR: NEGATIVE
Influenza B by PCR: NEGATIVE
SARS Coronavirus 2 by RT PCR: NEGATIVE

## 2020-10-03 LAB — GLUCOSE, CAPILLARY: Glucose-Capillary: 195 mg/dL — ABNORMAL HIGH (ref 70–99)

## 2020-10-03 LAB — CBG MONITORING, ED: Glucose-Capillary: 168 mg/dL — ABNORMAL HIGH (ref 70–99)

## 2020-10-03 SURGERY — IRRIGATION AND DEBRIDEMENT EXTREMITY
Anesthesia: General | Site: Arm Lower | Laterality: Right

## 2020-10-03 MED ORDER — SODIUM CHLORIDE 0.9 % IV SOLN
2.0000 g | Freq: Three times a day (TID) | INTRAVENOUS | Status: DC
  Administered 2020-10-03 – 2020-10-07 (×12): 2 g via INTRAVENOUS
  Filled 2020-10-03 (×12): qty 2

## 2020-10-03 MED ORDER — HYDROXYZINE HCL 25 MG PO TABS
25.0000 mg | ORAL_TABLET | Freq: Four times a day (QID) | ORAL | Status: AC | PRN
  Administered 2020-10-06: 25 mg via ORAL
  Filled 2020-10-03: qty 1

## 2020-10-03 MED ORDER — HYDROMORPHONE HCL 1 MG/ML IJ SOLN
INTRAMUSCULAR | Status: DC | PRN
Start: 2020-10-03 — End: 2020-10-03
  Administered 2020-10-03: .5 mg via INTRAVENOUS

## 2020-10-03 MED ORDER — CLONIDINE HCL 0.1 MG PO TABS
0.1000 mg | ORAL_TABLET | Freq: Every day | ORAL | Status: DC
  Administered 2020-10-03: 0.1 mg via ORAL
  Filled 2020-10-03: qty 1

## 2020-10-03 MED ORDER — INSULIN ASPART 100 UNIT/ML ~~LOC~~ SOLN
0.0000 [IU] | Freq: Three times a day (TID) | SUBCUTANEOUS | Status: DC
  Administered 2020-10-04: 2 [IU] via SUBCUTANEOUS
  Administered 2020-10-04: 5 [IU] via SUBCUTANEOUS
  Administered 2020-10-04: 3 [IU] via SUBCUTANEOUS
  Administered 2020-10-05: 2 [IU] via SUBCUTANEOUS
  Administered 2020-10-05 (×2): 3 [IU] via SUBCUTANEOUS
  Administered 2020-10-06: 1 [IU] via SUBCUTANEOUS
  Administered 2020-10-06: 2 [IU] via SUBCUTANEOUS
  Administered 2020-10-06 – 2020-10-08 (×6): 3 [IU] via SUBCUTANEOUS
  Administered 2020-10-08: 5 [IU] via SUBCUTANEOUS
  Administered 2020-10-09: 3 [IU] via SUBCUTANEOUS
  Administered 2020-10-09: 2 [IU] via SUBCUTANEOUS

## 2020-10-03 MED ORDER — CHLORHEXIDINE GLUCONATE 0.12 % MT SOLN
OROMUCOSAL | Status: AC
  Filled 2020-10-03: qty 15

## 2020-10-03 MED ORDER — OXYCODONE HCL 5 MG PO TABS
10.0000 mg | ORAL_TABLET | Freq: Once | ORAL | Status: AC
  Administered 2020-10-03: 10 mg via ORAL
  Filled 2020-10-03: qty 2

## 2020-10-03 MED ORDER — LACTATED RINGERS IV SOLN: INTRAVENOUS | Status: DC

## 2020-10-03 MED ORDER — STERILE WATER FOR IRRIGATION IR SOLN
Status: DC | PRN
  Administered 2020-10-03: 200 mL

## 2020-10-03 MED ORDER — MIDAZOLAM HCL 2 MG/2ML IJ SOLN
INTRAMUSCULAR | Status: AC
  Filled 2020-10-03: qty 2

## 2020-10-03 MED ORDER — GADOBUTROL 1 MMOL/ML IV SOLN
9.0000 mL | Freq: Once | INTRAVENOUS | Status: AC | PRN
  Administered 2020-10-03: 9 mL via INTRAVENOUS

## 2020-10-03 MED ORDER — LACTATED RINGERS IV SOLN: INTRAVENOUS | Status: AC

## 2020-10-03 MED ORDER — HYDROMORPHONE HCL 1 MG/ML IJ SOLN
0.5000 mg | INTRAMUSCULAR | Status: DC | PRN
  Administered 2020-10-03 (×3): 0.5 mg via INTRAVENOUS

## 2020-10-03 MED ORDER — HYDROMORPHONE HCL 1 MG/ML IJ SOLN
INTRAMUSCULAR | Status: AC
  Administered 2020-10-03: 0.5 mg
  Filled 2020-10-03: qty 1

## 2020-10-03 MED ORDER — 0.9 % SODIUM CHLORIDE (POUR BTL) OPTIME
TOPICAL | Status: DC | PRN
  Administered 2020-10-03: 1000 mL

## 2020-10-03 MED ORDER — KETOROLAC TROMETHAMINE 30 MG/ML IJ SOLN: 30.0000 mg | Freq: Once | INTRAMUSCULAR | Status: DC

## 2020-10-03 MED ORDER — SUCCINYLCHOLINE CHLORIDE 20 MG/ML IJ SOLN
INTRAMUSCULAR | Status: DC | PRN
  Administered 2020-10-03: 120 mg via INTRAVENOUS

## 2020-10-03 MED ORDER — VANCOMYCIN HCL 1500 MG/300ML IV SOLN
1500.0000 mg | INTRAVENOUS | Status: DC
  Administered 2020-10-04 – 2020-10-05 (×2): 1500 mg via INTRAVENOUS
  Filled 2020-10-03 (×3): qty 300

## 2020-10-03 MED ORDER — OXYCODONE HCL 5 MG/5ML PO SOLN: 5.0000 mg | Freq: Once | ORAL | Status: DC | PRN

## 2020-10-03 MED ORDER — DULOXETINE HCL 60 MG PO CPEP
60.0000 mg | ORAL_CAPSULE | Freq: Every day | ORAL | Status: DC
  Filled 2020-10-03: qty 1

## 2020-10-03 MED ORDER — ATOMOXETINE HCL 40 MG PO CAPS
80.0000 mg | ORAL_CAPSULE | Freq: Every day | ORAL | Status: DC
  Filled 2020-10-03: qty 2

## 2020-10-03 MED ORDER — CLONIDINE HCL 0.1 MG PO TABS
0.1000 mg | ORAL_TABLET | ORAL | Status: AC
  Administered 2020-10-06 – 2020-10-07 (×4): 0.1 mg via ORAL
  Filled 2020-10-03 (×4): qty 1

## 2020-10-03 MED ORDER — ONDANSETRON 4 MG PO TBDP
4.0000 mg | ORAL_TABLET | Freq: Four times a day (QID) | ORAL | Status: AC | PRN
  Administered 2020-10-03: 4 mg via ORAL
  Filled 2020-10-03 (×2): qty 1

## 2020-10-03 MED ORDER — PROPOFOL 10 MG/ML IV BOLUS
INTRAVENOUS | Status: AC
  Filled 2020-10-03: qty 40

## 2020-10-03 MED ORDER — LACTATED RINGERS IV SOLN: INTRAVENOUS | Status: DC | PRN

## 2020-10-03 MED ORDER — MEPERIDINE HCL 25 MG/ML IJ SOLN: 6.2500 mg | INTRAMUSCULAR | Status: DC | PRN

## 2020-10-03 MED ORDER — DEXMEDETOMIDINE (PRECEDEX) IN NS 20 MCG/5ML (4 MCG/ML) IV SYRINGE
PREFILLED_SYRINGE | INTRAVENOUS | Status: DC | PRN
  Administered 2020-10-03 (×4): 4 ug via INTRAVENOUS

## 2020-10-03 MED ORDER — ONDANSETRON HCL 4 MG/2ML IJ SOLN
4.0000 mg | Freq: Four times a day (QID) | INTRAMUSCULAR | Status: DC | PRN
  Administered 2020-10-03 – 2020-10-09 (×13): 4 mg via INTRAVENOUS
  Filled 2020-10-03 (×14): qty 2

## 2020-10-03 MED ORDER — CHLORHEXIDINE GLUCONATE 0.12 % MT SOLN: 15.0000 mL | Freq: Once | OROMUCOSAL | Status: DC

## 2020-10-03 MED ORDER — PROPOFOL 10 MG/ML IV BOLUS
INTRAVENOUS | Status: DC | PRN
  Administered 2020-10-03: 200 mg via INTRAVENOUS
  Administered 2020-10-03: 40 mg via INTRAVENOUS

## 2020-10-03 MED ORDER — MIDAZOLAM HCL 2 MG/2ML IJ SOLN
0.5000 mg | Freq: Once | INTRAMUSCULAR | Status: AC | PRN
  Administered 2020-10-03: 2 mg via INTRAVENOUS

## 2020-10-03 MED ORDER — FENTANYL CITRATE (PF) 250 MCG/5ML IJ SOLN
INTRAMUSCULAR | Status: AC
  Filled 2020-10-03: qty 5

## 2020-10-03 MED ORDER — INSULIN ASPART 100 UNIT/ML ~~LOC~~ SOLN
0.0000 [IU] | Freq: Every day | SUBCUTANEOUS | Status: DC
  Administered 2020-10-05: 3 [IU] via SUBCUTANEOUS
  Administered 2020-10-06 – 2020-10-07 (×2): 2 [IU] via SUBCUTANEOUS

## 2020-10-03 MED ORDER — LOPERAMIDE HCL 2 MG PO CAPS: 2.0000 mg | ORAL_CAPSULE | ORAL | Status: AC | PRN

## 2020-10-03 MED ORDER — KETAMINE HCL 50 MG/5ML IJ SOSY
PREFILLED_SYRINGE | INTRAMUSCULAR | Status: AC
  Filled 2020-10-03: qty 5

## 2020-10-03 MED ORDER — METHOCARBAMOL 500 MG PO TABS
500.0000 mg | ORAL_TABLET | Freq: Three times a day (TID) | ORAL | Status: AC | PRN
  Administered 2020-10-04 – 2020-10-08 (×9): 500 mg via ORAL
  Filled 2020-10-03 (×10): qty 1

## 2020-10-03 MED ORDER — CLONIDINE HCL 0.1 MG PO TABS
0.1000 mg | ORAL_TABLET | Freq: Every day | ORAL | Status: AC
  Administered 2020-10-08 – 2020-10-09 (×2): 0.1 mg via ORAL
  Filled 2020-10-03 (×2): qty 1

## 2020-10-03 MED ORDER — HYDROMORPHONE HCL 1 MG/ML IJ SOLN
0.2500 mg | INTRAMUSCULAR | Status: DC | PRN
  Administered 2020-10-03 (×4): 0.5 mg via INTRAVENOUS

## 2020-10-03 MED ORDER — ONDANSETRON HCL 4 MG/2ML IJ SOLN
INTRAMUSCULAR | Status: DC | PRN
  Administered 2020-10-03: 10 mg via INTRAVENOUS

## 2020-10-03 MED ORDER — MORPHINE SULFATE (PF) 2 MG/ML IV SOLN
2.0000 mg | INTRAVENOUS | Status: DC | PRN
  Administered 2020-10-03 (×2): 2 mg via INTRAVENOUS
  Filled 2020-10-03: qty 1

## 2020-10-03 MED ORDER — BUPIVACAINE HCL (PF) 0.25 % IJ SOLN
INTRAMUSCULAR | Status: DC | PRN
  Administered 2020-10-03: 30 mL

## 2020-10-03 MED ORDER — INSULIN GLARGINE 100 UNIT/ML ~~LOC~~ SOLN
15.0000 [IU] | Freq: Every day | SUBCUTANEOUS | Status: DC
  Administered 2020-10-04: 15 [IU] via SUBCUTANEOUS
  Filled 2020-10-03 (×2): qty 0.15

## 2020-10-03 MED ORDER — MORPHINE SULFATE (PF) 4 MG/ML IV SOLN
INTRAVENOUS | Status: AC
  Administered 2020-10-03: 4 mg via INTRAVENOUS
  Filled 2020-10-03: qty 1

## 2020-10-03 MED ORDER — PROMETHAZINE HCL 25 MG/ML IJ SOLN
6.2500 mg | INTRAMUSCULAR | Status: DC | PRN
  Administered 2020-10-03: 12.5 mg via INTRAVENOUS

## 2020-10-03 MED ORDER — PROMETHAZINE HCL 25 MG/ML IJ SOLN
INTRAMUSCULAR | Status: AC
  Filled 2020-10-03: qty 1

## 2020-10-03 MED ORDER — MORPHINE SULFATE (PF) 2 MG/ML IV SOLN
2.0000 mg | INTRAVENOUS | Status: DC | PRN
  Administered 2020-10-04 – 2020-10-09 (×28): 2 mg via INTRAVENOUS
  Filled 2020-10-03 (×28): qty 1

## 2020-10-03 MED ORDER — VANCOMYCIN HCL 1750 MG/350ML IV SOLN
1750.0000 mg | INTRAVENOUS | Status: AC
  Administered 2020-10-03: 1750 mg via INTRAVENOUS
  Filled 2020-10-03: qty 350

## 2020-10-03 MED ORDER — NAPROXEN 250 MG PO TABS
500.0000 mg | ORAL_TABLET | Freq: Two times a day (BID) | ORAL | Status: AC | PRN
  Administered 2020-10-08: 500 mg via ORAL
  Filled 2020-10-03 (×2): qty 2

## 2020-10-03 MED ORDER — VANCOMYCIN HCL 1750 MG/350ML IV SOLN
1750.0000 mg | Freq: Once | INTRAVENOUS | Status: DC
  Filled 2020-10-03: qty 350

## 2020-10-03 MED ORDER — HYDROMORPHONE HCL 1 MG/ML IJ SOLN
INTRAMUSCULAR | Status: AC
  Administered 2020-10-03: 0.5 mg via INTRAVENOUS
  Filled 2020-10-03: qty 1

## 2020-10-03 MED ORDER — MORPHINE SULFATE (PF) 4 MG/ML IV SOLN
4.0000 mg | Freq: Once | INTRAVENOUS | Status: AC
  Administered 2020-10-03: 4 mg via INTRAVENOUS
  Filled 2020-10-03: qty 1

## 2020-10-03 MED ORDER — GABAPENTIN 600 MG PO TABS
600.0000 mg | ORAL_TABLET | Freq: Three times a day (TID) | ORAL | Status: DC
  Administered 2020-10-03 – 2020-10-09 (×17): 600 mg via ORAL
  Filled 2020-10-03 (×18): qty 1

## 2020-10-03 MED ORDER — MIDAZOLAM HCL 5 MG/5ML IJ SOLN
INTRAMUSCULAR | Status: DC | PRN
  Administered 2020-10-03 (×2): 1 mg via INTRAVENOUS

## 2020-10-03 MED ORDER — MORPHINE SULFATE (PF) 4 MG/ML IV SOLN
4.0000 mg | INTRAVENOUS | Status: AC
  Administered 2020-10-03 – 2020-10-04 (×4): 4 mg via INTRAVENOUS
  Filled 2020-10-03 (×3): qty 1

## 2020-10-03 MED ORDER — VANCOMYCIN HCL IN DEXTROSE 1-5 GM/200ML-% IV SOLN: 1000.0000 mg | Freq: Once | INTRAVENOUS | Status: DC

## 2020-10-03 MED ORDER — SODIUM CHLORIDE 0.9 % IV SOLN: 2.0000 g | Freq: Once | INTRAVENOUS | Status: DC

## 2020-10-03 MED ORDER — SODIUM CHLORIDE 0.9 % IV SOLN
1.0000 g | Freq: Once | INTRAVENOUS | Status: AC
  Administered 2020-10-03: 1 g via INTRAVENOUS
  Filled 2020-10-03: qty 10

## 2020-10-03 MED ORDER — KETAMINE HCL 10 MG/ML IJ SOLN
INTRAMUSCULAR | Status: DC | PRN
  Administered 2020-10-03 (×5): 10 mg via INTRAVENOUS

## 2020-10-03 MED ORDER — BUPIVACAINE HCL (PF) 0.25 % IJ SOLN
INTRAMUSCULAR | Status: AC
  Filled 2020-10-03: qty 30

## 2020-10-03 MED ORDER — MORPHINE SULFATE (PF) 2 MG/ML IV SOLN
2.0000 mg | INTRAVENOUS | Status: DC
  Filled 2020-10-03: qty 1

## 2020-10-03 MED ORDER — RISPERIDONE 2 MG PO TABS
2.0000 mg | ORAL_TABLET | Freq: Every day | ORAL | Status: DC
  Filled 2020-10-03: qty 1

## 2020-10-03 MED ORDER — OXYCODONE HCL 5 MG PO TABS: 5.0000 mg | ORAL_TABLET | Freq: Once | ORAL | Status: DC | PRN

## 2020-10-03 MED ORDER — HYDROMORPHONE HCL 1 MG/ML IJ SOLN
INTRAMUSCULAR | Status: AC
  Filled 2020-10-03: qty 0.5

## 2020-10-03 MED ORDER — DICYCLOMINE HCL 20 MG PO TABS
20.0000 mg | ORAL_TABLET | Freq: Four times a day (QID) | ORAL | Status: AC | PRN
  Filled 2020-10-03: qty 1

## 2020-10-03 MED ORDER — HYDROMORPHONE HCL 1 MG/ML IJ SOLN
INTRAMUSCULAR | Status: AC
  Administered 2020-10-03: 1 mg
  Filled 2020-10-03: qty 1

## 2020-10-03 MED ORDER — CLONIDINE HCL 0.1 MG PO TABS
0.1000 mg | ORAL_TABLET | Freq: Four times a day (QID) | ORAL | Status: AC
  Administered 2020-10-03 – 2020-10-05 (×10): 0.1 mg via ORAL
  Filled 2020-10-03 (×10): qty 1

## 2020-10-03 MED ORDER — FENTANYL CITRATE (PF) 250 MCG/5ML IJ SOLN
INTRAMUSCULAR | Status: DC | PRN
Start: 2020-10-03 — End: 2020-10-03
  Administered 2020-10-03 (×2): 50 ug via INTRAVENOUS
  Administered 2020-10-03 (×3): 100 ug via INTRAVENOUS

## 2020-10-03 MED ORDER — LIDOCAINE HCL (CARDIAC) PF 100 MG/5ML IV SOSY
PREFILLED_SYRINGE | INTRAVENOUS | Status: DC | PRN
  Administered 2020-10-03: 60 mg via INTRATRACHEAL

## 2020-10-03 MED ORDER — SODIUM CHLORIDE 0.9 % IR SOLN
Status: DC | PRN
  Administered 2020-10-03: 3000 mL

## 2020-10-03 SURGICAL SUPPLY — 49 items
BNDG ELASTIC 4X5.8 VLCR STR LF (GAUZE/BANDAGES/DRESSINGS) ×3 IMPLANT
BNDG ESMARK 4X9 LF (GAUZE/BANDAGES/DRESSINGS) IMPLANT
BNDG GAUZE ELAST 4 BULKY (GAUZE/BANDAGES/DRESSINGS) ×3 IMPLANT
CHLORAPREP W/TINT 26 (MISCELLANEOUS) ×3 IMPLANT
CORD BIPOLAR FORCEPS 12FT (ELECTRODE) ×3 IMPLANT
COVER SURGICAL LIGHT HANDLE (MISCELLANEOUS) ×3 IMPLANT
COVER WAND RF STERILE (DRAPES) IMPLANT
CUFF TOURN SGL QUICK 18X4 (TOURNIQUET CUFF) ×3 IMPLANT
CUFF TOURN SGL QUICK 24 (TOURNIQUET CUFF)
CUFF TRNQT CYL 24X4X16.5-23 (TOURNIQUET CUFF) IMPLANT
DRAPE U-SHAPE 47X51 STRL (DRAPES) ×3 IMPLANT
DRSG XEROFORM 1X8 (GAUZE/BANDAGES/DRESSINGS) ×3 IMPLANT
GAUZE PACKING IODOFORM 1/2 (PACKING) ×3 IMPLANT
GAUZE SPONGE 4X4 12PLY STRL (GAUZE/BANDAGES/DRESSINGS) IMPLANT
GAUZE SPONGE 4X4 12PLY STRL LF (GAUZE/BANDAGES/DRESSINGS) ×3 IMPLANT
GAUZE XEROFORM 5X9 LF (GAUZE/BANDAGES/DRESSINGS) IMPLANT
GLOVE BIOGEL PI IND STRL 7.5 (GLOVE) ×1 IMPLANT
GLOVE BIOGEL PI IND STRL 8 (GLOVE) ×2 IMPLANT
GLOVE BIOGEL PI INDICATOR 7.5 (GLOVE) ×2
GLOVE BIOGEL PI INDICATOR 8 (GLOVE) ×4
GLOVE SURG SS PI 7.5 STRL IVOR (GLOVE) ×3 IMPLANT
GLOVE SURG SYN 7.5  E (GLOVE) ×3
GLOVE SURG SYN 7.5 E (GLOVE) ×1 IMPLANT
GOWN STRL REUS W/ TWL LRG LVL3 (GOWN DISPOSABLE) ×1 IMPLANT
GOWN STRL REUS W/TWL 2XL LVL3 (GOWN DISPOSABLE) ×3 IMPLANT
GOWN STRL REUS W/TWL LRG LVL3 (GOWN DISPOSABLE) ×3
KIT BASIN OR (CUSTOM PROCEDURE TRAY) ×3 IMPLANT
KIT TURNOVER KIT B (KITS) ×3 IMPLANT
MANIFOLD NEPTUNE II (INSTRUMENTS) ×3 IMPLANT
NEEDLE HYPO 25GX1X1/2 BEV (NEEDLE) ×3 IMPLANT
NS IRRIG 1000ML POUR BTL (IV SOLUTION) ×3 IMPLANT
PACK ORTHO EXTREMITY (CUSTOM PROCEDURE TRAY) ×3 IMPLANT
PAD ABD 8X10 STRL (GAUZE/BANDAGES/DRESSINGS) ×3 IMPLANT
PAD ARMBOARD 7.5X6 YLW CONV (MISCELLANEOUS) ×3 IMPLANT
PAD CAST 4YDX4 CTTN HI CHSV (CAST SUPPLIES) ×1 IMPLANT
PADDING CAST COTTON 4X4 STRL (CAST SUPPLIES) ×3
SET CYSTO W/LG BORE CLAMP LF (SET/KITS/TRAYS/PACK) ×3 IMPLANT
SPONGE LAP 4X18 RFD (DISPOSABLE) IMPLANT
SWAB COLLECTION DEVICE MRSA (MISCELLANEOUS) ×3 IMPLANT
SWAB CULTURE ESWAB REG 1ML (MISCELLANEOUS) ×3 IMPLANT
SYR CONTROL 10ML LL (SYRINGE) ×3 IMPLANT
TOWEL GREEN STERILE (TOWEL DISPOSABLE) ×3 IMPLANT
TOWEL GREEN STERILE FF (TOWEL DISPOSABLE) IMPLANT
TUBE CONNECTING 12'X1/4 (SUCTIONS) ×1
TUBE CONNECTING 12X1/4 (SUCTIONS) ×2 IMPLANT
TUBING TUR DISP (UROLOGICAL SUPPLIES) IMPLANT
UNDERPAD 30X36 HEAVY ABSORB (UNDERPADS AND DIAPERS) ×3 IMPLANT
WATER STERILE IRR 1000ML POUR (IV SOLUTION) ×3 IMPLANT
YANKAUER SUCT BULB TIP NO VENT (SUCTIONS) ×3 IMPLANT

## 2020-10-03 NOTE — Consult Note (Signed)
Reason for Consult:Right FA cellulitis Referring Physician: R Rai Time called: 1026 Time at bedside: 1033   Chase Reed is an 54 y.o. male.  HPI: Chase Reed comes in with a right FA injection. He injected heroin in that arm Sunday night and missed the vein. When he awoke Monday morning he was in significant pain. This just worsened and spread with accompanying erythema and he sought treatment this morning. He felt a little feverish yesterday but nothing measured and no chills, sweats, or N/V. He is RHD and unemployed.  Past Medical History:  Diagnosis Date  . Cirrhosis (HCC)   . Diabetes mellitus without complication (HCC)   . Hepatitis     Past Surgical History:  Procedure Laterality Date  . APPLICATION OF WOUND VAC N/A 12/24/2018   Procedure: APPLICATION OF WOUND VAC;  Surgeon: Alleen Borne, MD;  Location: [facility] OR;  Service: Thoracic;  Laterality: N/A;  . APPLICATION OF WOUND VAC N/A 12/27/2018   Procedure: WOUND VAC CHANGE;  Surgeon: Alleen Borne, MD;  Location: [facility] OR;  Service: Thoracic;  Laterality: N/A;  . APPLICATION OF WOUND VAC N/A 01/14/2019   Procedure: APPLICATION OF WOUND VAC;  Surgeon: Alleen Borne, MD;  Location: [facility] OR;  Service: Vascular;  Laterality: N/A;  . I & D EXTREMITY N/A 01/14/2019   Procedure: DEBRIDEMENT OF STERNOCLAVICULAR JOINT;  Surgeon: Alleen Borne, MD;  Location: [facility] OR;  Service: Vascular;  Laterality: N/A;  . LITHOTRIPSY    . STERNAL WOUND DEBRIDEMENT N/A 12/24/2018   Procedure: I&D CHEST WALL ABSCESS;  Surgeon: Alleen Borne, MD;  Location: [facility] OR;  Service: Thoracic;  Laterality: N/A;  . STERNAL WOUND DEBRIDEMENT N/A 12/27/2018   Procedure: DEBRIDEMENT OF CHEST WALL WOUND;  Surgeon: Alleen Borne, MD;  Location: [facility] OR;  Service: Thoracic;  Laterality: N/A;  . TEE WITHOUT CARDIOVERSION N/A 01/03/2019   Procedure: TRANSESOPHAGEAL ECHOCARDIOGRAM (TEE);  Surgeon: Thurmon Fair, MD;  Location: [facility] ENDOSCOPY;  Service: Cardiovascular;  Laterality:  N/A;    Family History  Problem Relation Age of Onset  . COPD Father     Social History:  reports that he has been smoking cigarettes. He has a 7.50 pack-year smoking history. He has quit using smokeless tobacco. He reports current drug use. Drugs: Heroin and IV. He reports that he does not drink alcohol.  Allergies:  Allergies  Allergen Reactions  . Atorvastatin Other (See Comments)    Leg cramps and body aches    Medications: I have reviewed the patient's current medications.  Results for orders placed or performed during the hospital encounter of 10/02/20 (from the past 48 hour(s))  Urinalysis, Routine w reflex microscopic Urine, Clean Catch     Status: Abnormal   Collection Time: 10/02/20 10:14 PM  Result Value Ref Range   Color, Urine YELLOW YELLOW   APPearance CLEAR CLEAR   Specific Gravity, Urine 1.021 1.005 - 1.030   pH 5.0 5.0 - 8.0   Glucose, UA 150 (A) NEGATIVE mg/dL   Hgb urine dipstick NEGATIVE NEGATIVE   Bilirubin Urine NEGATIVE NEGATIVE   Ketones, ur NEGATIVE NEGATIVE mg/dL   Protein, ur NEGATIVE NEGATIVE mg/dL   Nitrite NEGATIVE NEGATIVE   Leukocytes,Ua NEGATIVE NEGATIVE    Comment: Performed at [facility], [address], [place], [place] [address]  CBC with Differential     Status: Abnormal   Collection Time: 10/02/20 10:24 PM  Result Value Ref Range   WBC 4.5 4.0 - 10.5 K/uL  RBC 4.12 (L) 4.22 - 5.81 MIL/uL   Hemoglobin 12.6 (L) 13.0 - 17.0 g/dL   HCT 07.8 (L) 39 - 52 %   MCV 89.3 80.0 - 100.0 fL   MCH 30.6 26.0 - 34.0 pg   MCHC 34.2 30.0 - 36.0 g/dL   RDW 67.5 44.9 - 20.1 %   Platelets 31 (L) 150 - 400 K/uL    Comment: REPEATED TO VERIFY PLATELET COUNT CONFIRMED BY SMEAR SPECIMEN CHECKED FOR CLOTS Immature Platelet Fraction may be clinically indicated, consider ordering this additional test [id number]    nRBC 0.0 0.0 - 0.2 %   Neutrophils Relative % 77 %   Neutro Abs 3.4 1.7 - 7.7 K/uL   Lymphocytes Relative 14 %   Lymphs Abs 0.6  (L) 0.7 - 4.0 K/uL   Monocytes Relative 8 %   Monocytes Absolute 0.4 0.1 - 1.0 K/uL   Eosinophils Relative 1 %   Eosinophils Absolute 0.1 0.0 - 0.5 K/uL   Basophils Relative 0 %   Basophils Absolute 0.0 0.0 - 0.1 K/uL   Immature Granulocytes 0 %   Abs Immature Granulocytes 0.02 0.00 - 0.07 K/uL    Comment: Performed at [facility], [address], [place], [place] [address]  Comprehensive metabolic panel     Status: Abnormal   Collection Time: 10/02/20 10:24 PM  Result Value Ref Range   Sodium 135 135 - 145 mmol/L   Potassium 4.7 3.5 - 5.1 mmol/L   Chloride 100 98 - 111 mmol/L   CO2 24 22 - 32 mmol/L   Glucose, Bld 220 (H) 70 - 99 mg/dL    Comment: Glucose reference range applies only to samples taken after fasting for at least 8 hours.   BUN 16 6 - 20 mg/dL   Creatinine, Ser 5.49 0.61 - 1.24 mg/dL   Calcium 9.1 8.9 - 82.6 mg/dL   Total Protein 7.5 6.5 - 8.1 g/dL   Albumin 3.8 3.5 - 5.0 g/dL   AST 62 (H) 15 - 41 U/L   ALT 62 (H) 0 - 44 U/L   Alkaline Phosphatase 110 38 - 126 U/L   Total Bilirubin 0.9 0.3 - 1.2 mg/dL   GFR, Estimated >41 >58 mL/min    Comment: (NOTE) Calculated using the CKD-EPI Creatinine Equation (2021)    Anion gap 11 5 - 15    Comment: Performed at [facility], [address], [place], [place] [address]  Lactic acid, plasma     Status: None   Collection Time: 10/02/20 10:25 PM  Result Value Ref Range   Lactic Acid, Venous 1.4 0.5 - 1.9 mmol/L    Comment: Performed at [facility], [address], [place], [place] [address]  Resp Panel by RT-PCR (Flu A&B, Covid) Nasopharyngeal Swab     Status: None   Collection Time: 10/03/20  5:12 AM   Specimen: Nasopharyngeal Swab; Nasopharyngeal(NP) swabs in vial transport medium  Result Value Ref Range   SARS Coronavirus 2 by RT PCR NEGATIVE NEGATIVE    Comment: (NOTE) SARS-CoV-2 target nucleic acids are NOT DETECTED.  The SARS-CoV-2 RNA is generally detectable in upper respiratory specimens  during the acute phase of infection. The lowest concentration of SARS-CoV-2 viral copies this assay can detect is 138 copies/mL. A negative result does not preclude SARS-Cov-2 infection and should not be used as the sole basis for treatment or other patient management decisions. A negative result may occur with  improper specimen collection/handling, submission of  specimen other than nasopharyngeal swab, presence of viral mutation(s) within the areas targeted by this assay, and inadequate number of viral copies(<138 copies/mL). A negative result must be combined with clinical observations, patient history, and epidemiological information. The expected result is Negative.  Fact Sheet for Patients:  BloggerCourse.com  Fact Sheet for Healthcare Providers:  SeriousBroker.it  This test is no t yet approved or cleared by the [place] FDA and  has been authorized for detection and/or diagnosis of SARS-CoV-2 by FDA under an Emergency Use Authorization (EUA). This EUA will remain  in effect (meaning this test can be used) for the duration of the COVID-19 declaration under Section 564(b)(1) of the Act, 21 U.S.C.section 360bbb-3(b)(1), unless the authorization is terminated  or revoked sooner.       Influenza A by PCR NEGATIVE NEGATIVE   Influenza B by PCR NEGATIVE NEGATIVE    Comment: (NOTE) The Xpert Xpress SARS-CoV-2/FLU/RSV plus assay is intended as an aid in the diagnosis of influenza from Nasopharyngeal swab specimens and should not be used as a sole basis for treatment. Nasal washings and aspirates are unacceptable for Xpert Xpress SARS-CoV-2/FLU/RSV testing.  Fact Sheet for Patients: BloggerCourse.com  Fact Sheet for Healthcare Providers: SeriousBroker.it  This test is not yet approved or cleared by the [place] FDA and has been authorized for detection and/or diagnosis  of SARS-CoV-2 by FDA under an Emergency Use Authorization (EUA). This EUA will remain in effect (meaning this test can be used) for the duration of the COVID-19 declaration under Section 564(b)(1) of the Act, 21 U.S.C. section 360bbb-3(b)(1), unless the authorization is terminated or revoked.  Performed at [facility], [address], [place], [place] [address]     DG Elbow Complete Right  Result Date: 10/03/2020 CLINICAL DATA:  Right elbow pain and redness related to heroin use EXAM: RIGHT ELBOW - COMPLETE 3+ VIEW COMPARISON:  None. FINDINGS: Subcutaneous reticulation and swollen appearance of the proximal forearm and medial to the elbow. No joint effusion, erosion, or malalignment. No soft tissue emphysema. IMPRESSION: Forearm swelling without soft tissue emphysema or elbow joint effusion. Electronically Signed   By: Marnee Spring M.D.   On: 10/03/2020 05:20   DG Wrist Complete Right  Result Date: 10/03/2020 CLINICAL DATA:  Wrist pain and redness relate to heroin use EXAM: RIGHT WRIST - COMPLETE 3+ VIEW COMPARISON:  None. FINDINGS: Subcutaneous reticulation in the mid to distal forearm few bubbles of gas clustered ventrally and laterally. No bony erosion. 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  Remote boxer's fracture. IMPRESSION: Forearm swelling with a cluster of gas bubbles in the ventral and lateral forearm. Electronically Signed   By: Marnee Spring M.D.   On: 10/03/2020 05:21    Review of Systems  Constitutional: Positive for fever (Possibly). Negative for chills and diaphoresis.  HENT: Negative for ear discharge, ear pain, hearing loss and tinnitus.   Eyes: Negative for photophobia and pain.  Respiratory: Negative for cough and shortness of breath.   Cardiovascular: Negative for chest pain.  Gastrointestinal: Negative for abdominal pain, nausea and vomiting.  Genitourinary: Negative for dysuria, flank pain, frequency and urgency.  Musculoskeletal: Positive for  arthralgias (Right FA). Negative for back pain, myalgias and neck pain.  Neurological: Negative for dizziness and headaches.  Hematological: Does not bruise/bleed easily.  Psychiatric/Behavioral: The patient is not nervous/anxious.    Blood pressure 121/76, pulse (!) 58, temperature 98 F (36.7 C), resp. rate 18, height 6' (1.829 m), weight 90.7 kg, SpO2 96 %. Physical  Exam Constitutional:      General: He is not in acute distress.    Appearance: He is well-developed. He is not diaphoretic.  HENT:     Head: Normocephalic and atraumatic.  Eyes:     General: No scleral icterus.       Right eye: No discharge.        Left eye: No discharge.     Conjunctiva/sclera: Conjunctivae normal.  Cardiovascular:     Rate and Rhythm: Normal rate and regular rhythm.  Pulmonary:     Effort: Pulmonary effort is normal. No respiratory distress.  Musculoskeletal:     Cervical back: Normal range of motion.     Comments: Right shoulder, elbow, wrist, digits- no skin wounds, severe TTP volar/medial FA, no fluctuance, + induration, good ROM elbow/wrist, erythema throughout, no instability, no blocks to motion  Sens  Ax/R/M/U intact  Mot   Ax/ R/ PIN/ M/ AIN/ U intact  Rad 2+  Skin:    General: Skin is warm and dry.  Neurological:     Mental Status: He is alert.  Psychiatric:        Behavior: Behavior normal.     Assessment/Plan: Right FA cellulitis -- Suspect this is just cellulitis but will get MRI to r/o deep abscess or fascial involvement. Could give clear liquids but would avoid reg diet until MRI results are back. DM IVDU Thrombocytopenia w/splenomegaly    Freeman CaldronMichael J. Akylah Hascall, PA-C Orthopedic Surgery [phone] 10/03/2020, 10:40 AM

## 2020-10-03 NOTE — ED Notes (Signed)
Pt to MRI

## 2020-10-03 NOTE — Transfer of Care (Signed)
Immediate Anesthesia Transfer of Care Note  Patient: Chase Reed  Procedure(s) Performed: IRRIGATION AND DEBRIDEMENT OF FOREARM (Right )  Patient Location: PACU  Anesthesia Type:General  Level of Consciousness: awake  Airway & Oxygen Therapy: Patient Spontanous Breathing  Post-op Assessment: Report given to RN and Post -op Vital signs reviewed and stable  Post vital signs: Reviewed and stable  Last Vitals:  Vitals Value Taken Time  BP 134/118 10/03/20 2050  Temp    Pulse 101 10/03/20 2051  Resp 15 10/03/20 2051  SpO2 100 % 10/03/20 2051  Vitals shown include unvalidated device data.  Last Pain:  Vitals:   10/03/20 1547  TempSrc:   PainSc: 5          Complications: No complications documented.

## 2020-10-03 NOTE — H&P (Addendum)
History and Physical        Hospital Admission Note Date: 10/03/2020  Patient name: Chase Reed Medical record number: [id number] Date of birth: [birth year] Age: 54 y.o. Gender: male  PCP: Courtney Heys, PA-C    Patient coming from: HOME   I have reviewed all records in the [facility].    Chief Complaint:  Right arm swelling and pain for last 3 days  HPI: Patient is a 54 year old male with history of IV drug use (heroin 1/2-1gm almost daily), hepatitis C, pancytopenia, cirrhosis, diabetes mellitus type 2, essential hypertension presented to ED with right arm swelling, redness and pain for last 3 days.  Patient reported that he was injecting heroin in the right arm 3 days ago (on Sunday night), that was cut with water, when he missed the wean.  Next morning he started noticing redness in the right arm, which progressively worsened and continue to extend down to his wrist and to the right elbow.  Patient reported he had fevers and was having chills, constant throbbing pain in the right arm 10/10, worse with movement.  He however continue to use IV heroin, last use was last night before coming to ED.  Otherwise denies numbness, weakness or pain in the fingers.  No chest pain, shortness of breath, nausea vomiting or diarrhea. Vaccinated for COVID-19.  ED work-up/course:  In ED, temp 98.4, respiratory rate 16, BP lowest to 73/54, improved to 121/76 at the time of my examination, O2 sat 10% on room air WBC is 4.5, hemoglobin 12.6, hematocrit 36.8, platelets 31K (baseline in 30s)   Review of Systems: Positives marked in 'bold' Constitutional: + fever, chills, diaphoresis, poor appetite and fatigue.  HEENT: Denies photophobia, eye pain, redness, hearing loss, ear pain, congestion, sore throat, rhinorrhea, sneezing, mouth sores, trouble swallowing, neck pain, neck  stiffness and tinnitus.   Respiratory: Denies SOB, DOE, cough, chest tightness,  and wheezing.   Cardiovascular: Denies chest pain, palpitations and leg swelling.  Gastrointestinal: Denies nausea, vomiting, abdominal pain, diarrhea, constipation, blood in stool and abdominal distention.  Genitourinary: Denies dysuria, urgency, frequency, hematuria, flank pain and difficulty urinating.  Musculoskeletal: See HPI  skin: Right forearm redness with induration, erythema with streaking, swelling to the right elbow and down to right wrist Neurological: Denies dizziness, seizures, syncope, weakness, light-headedness, numbness and headaches.  Hematological: Denies adenopathy. Easy bruising, personal or family bleeding history  Psychiatric/Behavioral: Denies suicidal ideation, mood changes, confusion, nervousness, sleep disturbance and agitation  Past Medical History: Past Medical History:  Diagnosis Date  . Cirrhosis (HCC)   . Diabetes mellitus without complication (HCC)   . Hepatitis     Past Surgical History:  Procedure Laterality Date  . APPLICATION OF WOUND VAC N/A 12/24/2018   Procedure: APPLICATION OF WOUND VAC;  Surgeon: Alleen Borne, MD;  Location: [facility] OR;  Service: Thoracic;  Laterality: N/A;  . APPLICATION OF WOUND VAC N/A 12/27/2018   Procedure: WOUND VAC CHANGE;  Surgeon: Alleen Borne, MD;  Location: [facility] OR;  Service: Thoracic;  Laterality: N/A;  . APPLICATION OF WOUND VAC N/A 01/14/2019   Procedure: APPLICATION OF WOUND VAC;  Surgeon: Alleen Borne, MD;  Location: [facility] OR;  Service: Vascular;  Laterality: N/A;  . I & D EXTREMITY N/A 01/14/2019   Procedure: DEBRIDEMENT OF STERNOCLAVICULAR JOINT;  Surgeon: Alleen Borne, MD;  Location: [facility] OR;  Service: Vascular;  Laterality: N/A;  . LITHOTRIPSY    . STERNAL WOUND DEBRIDEMENT N/A 12/24/2018   Procedure: I&D CHEST WALL ABSCESS;  Surgeon: Alleen Borne, MD;  Location: [facility] OR;  Service: Thoracic;  Laterality: N/A;  . STERNAL WOUND  DEBRIDEMENT N/A 12/27/2018   Procedure: DEBRIDEMENT OF CHEST WALL WOUND;  Surgeon: Alleen Borne, MD;  Location: [facility] OR;  Service: Thoracic;  Laterality: N/A;  . TEE WITHOUT CARDIOVERSION N/A 01/03/2019   Procedure: TRANSESOPHAGEAL ECHOCARDIOGRAM (TEE);  Surgeon: Thurmon Fair, MD;  Location[facility] ENDOSCOPY;  Service: Cardiovascular;  Laterality: N/A;    Medications: Prior to Admission medications   Medication Sig Start Date End Date Taking? Authorizing Provider  aspirin EC 81 MG EC tablet Take 1 tablet (81 mg total) by mouth daily. 01/25/19   Gold, Glenice Laine, PA-C  atomoxetine (STRATTERA) 80 MG capsule Take 1 capsule (80 mg total) by mouth daily. 07/25/20   Shanna Cisco, NP  cloNIDine (CATAPRES) 0.1 MG tablet Take 1 tablet by mouth daily. 10/06/18   [provider]  DULoxetine (CYMBALTA) 60 MG capsule Take 1 capsule (60 mg total) by mouth daily. 09/04/20   Shanna Cisco, NP  gabapentin (NEURONTIN) 600 MG tablet take 1 tablet by mouth THREE TIMES DAILY 01/20/19   [provider]  insulin aspart (NOVOLOG) 100 UNIT/ML injection Inject 2-8 Units into the skin See admin instructions. Sliding scale.over 250--2 units over 300--4 units over 350--6 units over 400--8 units Up to 50 units daily: For diabetes management 12/25/16   Armandina Stammer I, NP  insulin glargine (LANTUS) 100 UNIT/ML injection Inject 30 Units into the skin daily.     [provider]  Iron, Ferrous Sulfate, 325 (65 Fe) MG TABS Take 325 mg by mouth daily. 09/23/19   Jaci Standard, MD  lisinopril (ZESTRIL) 20 MG tablet Take 1 tablet by mouth daily. 07/07/19   [provider]  metFORMIN (GLUCOPHAGE) 1000 MG tablet Take 1,000 mg by mouth 2 (two) times daily. 04/29/18   [provider]  prochlorperazine (COMPAZINE) 10 MG tablet Take 1 tablet (10 mg total) by mouth every 6 (six) hours as needed for nausea or vomiting. 02/16/20   Jaci Standard, MD  risperiDONE (RISPERDAL) 2 MG tablet Take  1 tablet (2 mg total) by mouth at bedtime. 09/24/20   Shanna Cisco, NP  sitaGLIPtin (JANUVIA) 100 MG tablet Take 100 mg by mouth daily. 10/19/18   [provider]  SUBOXONE 8-2 MG FILM  12/21/18   [provider]  traZODone (DESYREL) 50 MG tablet Take 2 tablets (100 mg total) by mouth at bedtime as needed for sleep. 07/25/20   Shanna Cisco, NP    Allergies:   Allergies  Allergen Reactions  . Atorvastatin Other (See Comments)    Leg cramps and body aches    Social History:  reports that he has been smoking cigarettes. He has a 7.50 pack-year smoking history. He has quit using smokeless tobacco. He reports current drug use. Drugs: Heroin and IV. He reports that he does not drink alcohol.  Family History: Family History  Problem Relation Age of Onset  . COPD Father     Physical Exam: Blood pressure 125/76, pulse 60, temperature 98.4 F (36.9 C), temperature source Oral,  resp. rate 18, height 6' (1.829 m), weight 90.7 kg, SpO2 99 %. General: Alert, awake, oriented x3, i uncomfortable with pain Eyes: pink conjunctiva,anicteric sclera, pupils equal and reactive to light and accomodation, HEENT: normocephalic, atraumatic, oropharynx clear Neck: supple, no masses or lymphadenopathy, no goiter, no bruits, no JVD CVS: Regular rate and rhythm, without murmurs, rubs or gallops. No lower extremity edema Resp : Clear to auscultation bilaterally, no wheezing, rales or rhonchi. GI : Soft, nontender, nondistended, positive bowel sounds, no masses. No hepatomegaly. No hernia.  Musculoskeletal: No clubbing or cyanosis, positive pedal pulses. No contracture. right arm ROM decreased due to pain and cellulitis Neuro: Grossly intact, no focal neurological deficits, strength 5/5 upper and lower extremities bilaterally Psych: alert and oriented x 3, normal mood and affect Skin: Right forearm with erythema, induration in ventral and lateral forearm, tender to touch, from right  elbow to right wrist.  Left arm scar due to prior surgery, scar on the upper chest due to prior surgery   LABS on Admission: I have personally reviewed all the labs and imagings below    Basic Metabolic Panel: Recent Labs  Lab 10/02/20 2224  NA 135  K 4.7  CL 100  CO2 24  GLUCOSE 220*  BUN 16  CREATININE 1.14  CALCIUM 9.1   Liver Function Tests: Recent Labs  Lab 10/02/20 2224  AST 62*  ALT 62*  ALKPHOS 110  BILITOT 0.9  PROT 7.5  ALBUMIN 3.8   No results for input(s): LIPASE, AMYLASE in the last 168 hours. No results for input(s): AMMONIA in the last 168 hours. CBC: Recent Labs  Lab 10/02/20 2224  WBC 4.5  NEUTROABS 3.4  HGB 12.6*  HCT 36.8*  MCV 89.3  PLT 31*   Cardiac Enzymes: No results for input(s): CKTOTAL, CKMB, CKMBINDEX, TROPONINI in the last 168 hours. BNP: Invalid input(s): POCBNP CBG: No results for input(s): GLUCAP in the last 168 hours.  Radiological Exams on Admission:  DG Elbow Complete Right  Result Date: 10/03/2020 CLINICAL DATA:  Right elbow pain and redness related to heroin use EXAM: RIGHT ELBOW - COMPLETE 3+ VIEW COMPARISON:  None. FINDINGS: Subcutaneous reticulation and swollen appearance of the proximal forearm and medial to the elbow. No joint effusion, erosion, or malalignment. No soft tissue emphysema. IMPRESSION: Forearm swelling without soft tissue emphysema or elbow joint effusion. Electronically Signed   By: Marnee Spring M.D.   On: 10/03/2020 05:20   DG Wrist Complete Right  Result Date: 10/03/2020 CLINICAL DATA:  Wrist pain and redness relate to heroin use EXAM: RIGHT WRIST - COMPLETE 3+ VIEW COMPARISON:  None. FINDINGS: Subcutaneous reticulation in the mid to distal forearm few bubbles of gas clustered ventrally and laterally. No bony erosion. 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  Remote boxer's fracture. IMPRESSION: Forearm swelling with a cluster of gas bubbles in the ventral and lateral forearm. Electronically Signed   By:  Marnee Spring M.D.   On: 10/03/2020 05:21      EKG: Independently reviewed. *Sinus rhythm, rate 50, no acute ST-T wave changes  Assessment/Plan Principal Problem:   Right arm cellulitis with possible abscess, concern for necrotizing fasciitis, in the setting of IV drug use -Right wrist x-ray showed forearm swelling with cluster of gas bubbles in the ventral and lateral forearm.  Consulted orthopedics, concern for abscess, rule out necrotizing fasciitis, will keep n.p.o. after orthopedics evaluation -Continue IV fluids hydration, pain control -Elevate right arm, continue IV vancomycin and cefepime due to severe cellulitis and possible  abscess  Active Problems:   Opioid use disorder, severe, dependence (HCC) with continued IV heroin use -Patient counseled on substance abuse, he wants to quit but does not want to use methadone and wants to slowly taper down himself -Case management consult for substance abuse -High risk for withdrawals and in significant pain from right arm cellulitis, will place on IV scheduled morphine and as needed for the severe pain and withdrawals. - Also placed on clonidine detox protocol  Known history of cirrhosis (HCC) from hep C, thrombocytopenia -Patient ports that he has been treated for hepatitis C, follows Dr. Luciana Axeomer in ID clinic -Platelets close to his baseline (30-35K), follows Dr. Leonides Schanzorsey in the oncology clinic    Nicotine dependence -States 1 pack last a couple days, declined nicotine patch    Essential hypertension -BP currently stable, continue clonidine, however hold off on lisinopril    Diabetes mellitus (HCC), type II, IDDM, uncontrolled with neuropathy -Hold off on Metformin, Januvia -On Lantus, 30 mg daily at home, will start on 15 mg as patient is n.p.o. and continue sliding scale insulin   DVT prophylaxis: SCDs due to thrombocytopenia  CODE STATUS: Full CODE STATUS  Consults called: Orthopedics  Family Communication: Admission,  patients condition and plan of care including tests being ordered have been discussed with the patient who indicates understanding and agree with the plan and Code Status  Admission status:   The medical decision making on this patient was of high complexity and the patient is at high risk for clinical deterioration, therefore this is a level 3 admission.  Severity of Illness:      The appropriate patient status for this patient is OBSERVATION. Observation status is judged to be reasonable and necessary in order to provide the required intensity of service to ensure the patient's safety. The patient's presenting symptoms, physical exam findings, and initial radiographic and laboratory data in the context of their medical condition is felt to place them at decreased risk for further clinical deterioration. Furthermore, it is anticipated that the patient will be medically stable for discharge from the hospital within 2 midnights of admission. The following factors support the patient status of observation.   " The patient's presenting symptoms include right arm cellulitis.  IV drug use " The physical exam findings include right forearm cellulitis with induration, possibility of abscess, rule out necrotizing fasciitis " The initial radiographic and laboratory data are x-rays of the wrist shows gas bubbles, rule out abscess     Time Spent on Admission: 70 minutes     Jennifer Payes M.D. [facility] 10/03/2020, 10:23 AM

## 2020-10-03 NOTE — Discharge Planning (Signed)
Arlys John with [facility] [employer] called to confirm pt ED admission.  Arlys John will visit pt within the hour.

## 2020-10-03 NOTE — Anesthesia Postprocedure Evaluation (Signed)
Anesthesia Post Note  Patient: Chase Reed  Procedure(s) Performed: Irrigation debridement of RIGHT forearm subfascial abscess measuring approximately 6 x 3 x 1.5 cm (Right Arm Lower)     Patient location during evaluation: PACU Anesthesia Type: General Level of consciousness: awake and alert, patient cooperative and oriented Pain management: pain level not controlled (pain difficult to control) Vital Signs Assessment: post-procedure vital signs reviewed and stable Respiratory status: spontaneous breathing, nonlabored ventilation and respiratory function stable Cardiovascular status: blood pressure returned to baseline and stable Postop Assessment: no apparent nausea or vomiting Anesthetic complications: no   No complications documented.  Last Vitals:  Vitals:   10/03/20 2145 10/03/20 2222  BP:  (!) 188/113  Pulse: 80 62  Resp: 18 19  Temp: 36.8 C 36.9 C  SpO2: 97% 100%    Last Pain:  Vitals:   10/03/20 2222  TempSrc: Oral  PainSc:                  Harlo Fabela,E. Ely Ballen

## 2020-10-03 NOTE — Anesthesia Procedure Notes (Signed)
Procedure Name: Intubation Date/Time: 10/03/2020 8:10 PM Performed by: Claudina Lick, CRNA Pre-anesthesia Checklist: Patient identified, Emergency Drugs available, Suction available, Patient being monitored and Timeout performed Patient Re-evaluated:Patient Re-evaluated prior to induction Oxygen Delivery Method: Circle system utilized Preoxygenation: Pre-oxygenation with 100% oxygen Induction Type: IV induction, Rapid sequence and Cricoid Pressure applied Laryngoscope Size: Miller and 2 Grade View: Grade I Tube type: Oral Tube size: 7.5 mm Number of attempts: 1 Airway Equipment and Method: Stylet Placement Confirmation: ETT inserted through vocal cords under direct vision,  positive ETCO2 and breath sounds checked- equal and bilateral Secured at: 22 cm Tube secured with: Tape Dental Injury: Teeth and Oropharynx as per pre-operative assessment

## 2020-10-03 NOTE — ED Provider Notes (Addendum)
[facility] EMERGENCY DEPARTMENT Provider Note   CSN: [id number] Arrival date & time: 10/02/20  2158     History Chief Complaint  Patient presents with  . Cellulitis    Chase Reed is a 54 y.o. male with a history of IV drug use, hepatitis C, pancytopenia, and cirrhosis who presents the emergency department with a chief complaint of right arm pain.  The patient reports that he was injecting heroin that was cut with water approximately 3 days ago when he missed the vein.  Over the last 3 days, he has had constant, gradually worsening redness and swelling to the right forearm that is continue to extend down to his wrist and above his right elbow.  He is endorsing significant pain in his right elbow that is worse with movement.  States that he is unable to fully extend his elbow due to pain.  He also reports that he has developed pain in his right wrist that is worse with movement.  No other known aggravating or alleviating factors.  He reports an associated tactile fever 2 nights ago, and he has been having chills.  He denies numbness, weakness, pain in the right hand, fingers, or shoulder, rash, chest pain, shortness of breath, or URI symptoms.  He is fully vaccinated against COVID-19.  Reports that he last used just prior to arrival due to pain from the infection.  He states that he thinks that he is ready to stop using IV opioids as he has been using for more than 20 years and has recently been using 0.5 to 1 g daily.   The history is provided by the patient and medical records. No language interpreter was used.       Past Medical History:  Diagnosis Date  . Cirrhosis (HCC)   . Diabetes mellitus without complication (HCC)   . Hepatitis     Patient Active Problem List   Diagnosis Date Noted  . Right arm cellulitis 10/03/2020  . Major depressive disorder, recurrent episode, moderate with anxious distress (HCC) 05/04/2020  . Pancytopenia (HCC) 02/03/2019  .  Chronic viral hepatitis C (HCC) 02/03/2019  . 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 Chest wall abscess 12/24/2018  . MSSA bacteremia 12/24/2018  . Cirrhosis (HCC) 12/24/2018  . Substance induced mood disorder (HCC) 12/21/2016  . Opioid use disorder, severe, dependence (HCC) 05/02/2016  . 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 Elevated lipase 05/02/2016    Past Surgical History:  Procedure Laterality Date  . APPLICATION OF WOUND VAC N/A 12/24/2018   Procedure: APPLICATION OF WOUND VAC;  Surgeon: Alleen Borne, MD;  Location: [facility] OR;  Service: Thoracic;  Laterality: N/A;  . APPLICATION OF WOUND VAC N/A 12/27/2018   Procedure: WOUND VAC CHANGE;  Surgeon: Alleen Borne, MD;  Location: [facility] OR;  Service: Thoracic;  Laterality: N/A;  . APPLICATION OF WOUND VAC N/A 01/14/2019   Procedure: APPLICATION OF WOUND VAC;  Surgeon: Alleen Borne, MD;  Location: [facility] OR;  Service: Vascular;  Laterality: N/A;  . I & D EXTREMITY N/A 01/14/2019   Procedure: DEBRIDEMENT OF STERNOCLAVICULAR JOINT;  Surgeon: Alleen Borne, MD;  Location: [facility] OR;  Service: Vascular;  Laterality: N/A;  . LITHOTRIPSY    . STERNAL WOUND DEBRIDEMENT N/A 12/24/2018   Procedure: I&D CHEST WALL ABSCESS;  Surgeon: Alleen Borne, MD;  Location: [facility] OR;  Service: Thoracic;  Laterality: N/A;  . STERNAL WOUND DEBRIDEMENT N/A 12/27/2018   Procedure: DEBRIDEMENT OF  CHEST WALL WOUND;  Surgeon: Alleen BorneBartle, Bryan K, MD;  [facility];  Service: Thoracic;  Laterality: N/A;  . TEE WITHOUT CARDIOVERSION N/A 01/03/2019   Procedure: TRANSESOPHAGEAL ECHOCARDIOGRAM (TEE);  Surgeon: Thurmon Fairroitoru, Mihai, MD;  [facility];  Service: Cardiovascular;  Laterality: N/A;       Family History  Problem Relation Age of Onset  . COPD Father     Social History   Tobacco Use  . Smoking status: Current Some Day Smoker    Packs/day: 0.25    Years: 30.00    Pack years: 7.50    Types: Cigarettes  . Smokeless  tobacco: Former Clinical biochemistUser  Vaping Use  . Vaping Use: Never used  Substance Use Topics  . Alcohol use: No  . Drug use: Yes    Types: Heroin, IV    Comment: Fentanyl and "Oxy" per patient     Home Medications Prior to Admission medications   Medication Sig Start Date End Date Taking? Authorizing Provider  aspirin EC 81 MG EC tablet Take 1 tablet (81 mg total) by mouth daily. 01/25/19   Gold, Glenice LaineWayne E, PA-C  atomoxetine (STRATTERA) 80 MG capsule Take 1 capsule (80 mg total) by mouth daily. 07/25/20   Shanna CiscoParsons, Brittney E, NP  cloNIDine (CATAPRES) 0.1 MG tablet Take 1 tablet by mouth daily. 10/06/18   [provider]  DULoxetine (CYMBALTA) 60 MG capsule Take 1 capsule (60 mg total) by mouth daily. 09/04/20   Shanna CiscoParsons, Brittney E, NP  gabapentin (NEURONTIN) 600 MG tablet take 1 tablet by mouth THREE TIMES DAILY 01/20/19   [provider]  insulin aspart (NOVOLOG) 100 UNIT/ML injection Inject 2-8 Units into the skin See admin instructions. Sliding scale.over 250--2 units over 300--4 units over 350--6 units over 400--8 units Up to 50 units daily: For diabetes management 12/25/16   Armandina StammerNwoko, Agnes I, NP  insulin glargine (LANTUS) 100 UNIT/ML injection Inject 30 Units into the skin daily.     [provider]  Iron, Ferrous Sulfate, 325 (65 Fe) MG TABS Take 325 mg by mouth daily. 09/23/19   Jaci Standardorsey, John T IV, MD  lisinopril (ZESTRIL) 20 MG tablet Take 1 tablet by mouth daily. 07/07/19   [provider]  metFORMIN (GLUCOPHAGE) 1000 MG tablet Take 1,000 mg by mouth 2 (two) times daily. 04/29/18   [provider]  prochlorperazine (COMPAZINE) 10 MG tablet Take 1 tablet (10 mg total) by mouth every 6 (six) hours as needed for nausea or vomiting. 02/16/20   Jaci Standardorsey, John T IV, MD  risperiDONE (RISPERDAL) 2 MG tablet Take 1 tablet (2 mg total) by mouth at bedtime. 09/24/20   Shanna CiscoParsons, Brittney E, NP  sitaGLIPtin (JANUVIA) 100 MG tablet Take 100 mg by mouth daily. 10/19/18    [provider]  SUBOXONE 8-2 MG FILM  12/21/18   [provider]  traZODone (DESYREL) 50 MG tablet Take 2 tablets (100 mg total) by mouth at bedtime as needed for sleep. 07/25/20   Shanna CiscoParsons, Brittney E, NP    Allergies    Atorvastatin  Review of Systems   Review of Systems  Constitutional: Positive for chills and fever. Negative for appetite change.  HENT: Negative for congestion and sore throat.   Respiratory: Negative for shortness of breath and wheezing.   Cardiovascular: Negative for chest pain and palpitations.  Gastrointestinal: Negative for abdominal pain, diarrhea and vomiting.  Genitourinary: Negative for dysuria.  Musculoskeletal: Positive for arthralgias and myalgias. Negative for back pain, joint swelling, neck pain and  neck stiffness.  Skin: Positive for color change and wound. Negative for rash.  Allergic/Immunologic: Negative for immunocompromised state.  Neurological: Negative for seizures, syncope, weakness, numbness and headaches.  Psychiatric/Behavioral: Negative for confusion.    Physical Exam Updated Vital Signs BP 118/67   Pulse (!) 48   Temp 98.4 F (36.9 C) (Oral)   Resp 16   Ht 6' (1.829 m)   Wt 90.7 kg   SpO2 97%   BMI 27.12 kg/m   Physical Exam Vitals and nursing note reviewed.  Constitutional:      General: He is not in acute distress.    Appearance: He is well-developed. He is not ill-appearing, toxic-appearing or diaphoretic.  HENT:     Head: Normocephalic.  Eyes:     Conjunctiva/sclera: Conjunctivae normal.  Cardiovascular:     Rate and Rhythm: Normal rate and regular rhythm.     Heart sounds: No murmur heard.   Pulmonary:     Effort: Pulmonary effort is normal.  Abdominal:     General: There is no distension.     Palpations: Abdomen is soft.  Musculoskeletal:     Cervical back: Neck supple.     Comments: Circumferential erythema and warmth noted to the right upper extremity that starts at the wrist and extends  above the elbow.  There is some induration noted to the mid forearm.  No fluctuance. Muscular compartments are soft. Pain is not out of proportion to exam.  Range of motion of the right elbow is decreased secondary to pain.  There is diffuse tenderness to palpation of the right elbow.  No edema.  Full active and passive range of motion of the right wrist.  There is diffuse tenderness to palpation.  No erythema, streaking, or warmth noted to the right hand or fingers.   Neurovascular intact to the right upper extremity.  Skin:    General: Skin is warm and dry.     Capillary Refill: Capillary refill takes less than 2 seconds.  Neurological:     Mental Status: He is alert.  Psychiatric:        Behavior: Behavior normal.         ED Results / Procedures / Treatments   Labs (all labs ordered are listed, but only abnormal results are displayed) Labs Reviewed  CBC WITH DIFFERENTIAL/PLATELET - Abnormal; Notable for the following components:      Result Value   RBC 4.12 (*)    Hemoglobin 12.6 (*)    HCT 36.8 (*)    Platelets 31 (*)    Lymphs Abs 0.6 (*)    All other components within normal limits  COMPREHENSIVE METABOLIC PANEL - Abnormal; Notable for the following components:   Glucose, Bld 220 (*)    AST 62 (*)    ALT 62 (*)    All other components within normal limits  URINALYSIS, ROUTINE W REFLEX MICROSCOPIC - Abnormal; Notable for the following components:   Glucose, UA 150 (*)    All other components within normal limits  RESP PANEL BY RT-PCR (FLU A&B, COVID) ARPGX2  LACTIC ACID, PLASMA    EKG None  Radiology DG Elbow Complete Right  Result Date: 10/03/2020 CLINICAL DATA:  Right elbow pain and redness related to heroin use EXAM: RIGHT ELBOW - COMPLETE 3+ VIEW COMPARISON:  None. FINDINGS: Subcutaneous reticulation and swollen appearance of the proximal forearm and medial to the elbow. No joint effusion, erosion, or malalignment. No soft tissue emphysema. IMPRESSION:  Forearm swelling without soft tissue  emphysema or elbow joint effusion. Electronically Signed   By: Marnee Spring M.D.   On: 10/03/2020 05:20   DG Wrist Complete Right  Result Date: 10/03/2020 CLINICAL DATA:  Wrist pain and redness relate to heroin use EXAM: RIGHT WRIST - COMPLETE 3+ VIEW COMPARISON:  None. FINDINGS: Subcutaneous reticulation in the mid to distal forearm few bubbles of gas clustered ventrally and laterally. No bony erosion. 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  Remote boxer's fracture. IMPRESSION: Forearm swelling with a cluster of gas bubbles in the ventral and lateral forearm. Electronically Signed   By: Marnee Spring M.D.   On: 10/03/2020 05:21    Procedures Procedures (including critical care time)  Medications Ordered in ED Medications  vancomycin (VANCOREADY) IVPB 1750 mg/350 mL (has no administration in time range)  cefTRIAXone (ROCEPHIN) 1 g in sodium chloride 0.9 % 100 mL IVPB (1 g Intravenous New Bag/Given 10/03/20 0518)  oxyCODONE (Oxy IR/ROXICODONE) immediate release tablet 10 mg (10 mg Oral Given 10/03/20 0518)    ED Course  I have reviewed the triage vital signs and the nursing notes.  Pertinent labs & imaging results that were available during my care of the patient were reviewed by me and considered in my medical decision making (see chart for details).    MDM Rules/Calculators/A&P                          54 year old male with a history of IV drug use, hepatitis C, pancytopenia, and cirrhosis who presents with 3 days of worsening right arm pain, swelling, and warmth with chills and subjective fevers that began after he missed the vein while injecting IV heroin that was cut with water. The patient was discussed with Dr. Clayborne Dana, attending physician.   Vital signs are stable. On exam, he has circumferential cellulitis of the right upper extremity that extends from the wrist to above the right elbow. He is having pain with range of motion of the right wrist  and elbow, which could be from cellulitis or from developing septic joint.  Imaging has been reviewed and independently interpreted by me. Given concern for pain with range of motion, right elbow and wrist imaging was obtained. No joint effusion. There is a cluster of gas bubbles in the ventral and lateral forearm, which I suspect is secondary from where the patient has been injecting based on imaging. I have a very low suspicion for necrotizing fasciitis as pain is not out of proportion to exam. Doubt compartment syndrome given that muscular compartments are soft.   Labs have been reviewed and independently interpreted by me. WBC is 4.5, uptrending from his baseline of around 1.8.  He has a thrombocytopenia, which is chronic and he is followed by oncology. Mild transaminitis, but abdomen is benign. Glucose is elevated at 220, but normal bicarb and anion gap.  Patient will require admission for cellulitis.  Site has been marked with skin marker. He has been started on Rocephin and vancomycin. There is no indication for incision and drainage at this time.  Patient does also express that he would like to stop using IV fentanyl and heroin.  Will place peer support consult.  Consult to the hospitalist team for admission.  Dr. Leafy Half will accept the patient for admission.  The patient appears reasonably stabilized for admission considering the current resources, flow, and capabilities available in the ED at this time, and I doubt any other [facility] requiring further screening and/or treatment in the ED  prior to admission.  Final Clinical Impression(s) / ED Diagnoses Final diagnoses:  Right arm cellulitis    Rx / DC Orders ED Discharge Orders    None       Leonela Kivi A, PA-C 10/03/20 0620    Frederik Pear A, PA-C 10/03/20 1914    Marily Memos, MD 10/03/20 [phone]

## 2020-10-03 NOTE — Op Note (Signed)
PREOPERATIVE DIAGNOSIS: Right forearm abscess from IV drug use  POSTOPERATIVE DIAGNOSIS: Same  ATTENDING PHYSICIAN: Gasper Lloyd. Roney Mans, III, MD who was present and scrubbed for the entire case   ASSISTANT SURGEON: None.   ANESTHESIA: General  SURGICAL PROCEDURES: Irrigation debridement of right forearm subfascial abscess measuring approximately 6 x 3 x 1.5 cm  SURGICAL INDICATIONS: Patient is a 54 year old male who presented to the ER last night.  On Sunday he was injecting IV heroin when he missed his vein and injected in the soft tissues.  Over the next day he developed increasing pain, redness and swelling and presented to the ER.  A subsequent MRI of the forearm was obtained which showed a large rim-enhancing lesion in the soft tissues consistent with a infectious abscess.  I discussed treatment options with the patient I did recommend proceeding forward with I&D of his right forearm abscess and continued care under the hospitalist service.  FINDING: Large abscess deep to the fascia in the right forearm.  It measured 6 x 3 x 1.5 cm in size  DESCRIPTION OF PROCEDURE: Patient was identified in the preop holding area where the risk benefits and alternatives of the procedure were discussed with the patient.  These risks include but are not limited to continued infection, bleeding, damage to surrounding structures including blood vessels and nerves, pain, stiffness and need for additional procedures.  Informed consent was obtained at that time the patient's right arm was marked with a surgical marking pen.  He was then brought back to the operative suite where timeout was performed identifying the correct patient operative site.  He was positioned supine on the operative table with his hand outstretched on a hand table.  He was induced under general anesthesia.  A tourniquet was placed on the upper right arm.  Preoperative antibiotics were held as he had already been receiving IV antibiotics from the  ER.  The limb was exsanguinated by gravity and the tourniquet was inflated.  Along the radial border of the forearm in its midportion, there was indurated and fluctuant area.  A longitudinal incision was made in this region.  Blunt dissection was carried down through the subcutaneous tissue.  And penetrating deep to the fascia, there was a large expression of abundant purulent material.  This was cultured for both aerobic and anaerobic bacteria.  Blunt dissection was continued through the abscess both proximally and distally.  It did track some up into the more proximal forearm but remained deep to the fascia and not significantly involving the muscle.  The abscess was debrided with a curette and rondure.  All nonviable muscle was removed through an excisional debridement with a rondure.  At this point the wound was copiously irrigated with 3 L normal saline via cystoscopy tubing.  Iodoform gauze was placed in the wound for packing and the incision was loosely closed with two 4-0 Prolene sutures.  Xeroform, 4 x 4's, ABD as well as a soft dressing were then applied to the forearm.  The tourniquet was released and the patient had return of brisk capillary refill to all of his digits.  He was awoken from his anesthesia and extubated in the operating without a complications.  He was taken the PACU in stable condition.  He tolerated the procedure well and there are no complications.  ESTIMATED BLOOD LOSS: 15 mL  TOURNIQUET TIME: 15 minutes  SPECIMENS: Aerobic and anaerobic cultures  POSTOPERATIVE PLAN: Patient will be transferred back to the floor for continued care under  the hospitalist service.  We will follow-up his cultures.  I will have him evaluated by PT wound care for beginning hydrotherapy tomorrow.  His packing can be pulled and hydrotherapy and dressing changes can begin.  We can replace packing as tolerated starting tomorrow as well.  Once his cultures have been obtained and antibiotic plan has been  established we will plan for discharge and close follow-up with me as an outpatient.  IMPLANTS: None

## 2020-10-03 NOTE — Progress Notes (Signed)
Pharmacy Antibiotic Note  Chase Reed is a 54 y.o. male admitted on 10/02/2020 with right arm pain.  Patient was injecting heroin and missed the vein.  Pharmacy has been consulted for vancomycin and cefepime dosing for cellulitis.  Patient received Rocephin this AM.  SCr 1.14, CrCL 83 ml/min, afebrile, WBC WNL, LA 1.4.  Plan: Vanc 1750mg  IV x 1, then 1500mg  IV Q24H for goal trough 10-15 mcg/mL Cefepime 2gm IV Q8H Monitor renal fxn, clinical progress, vanc trough as indicated   Height: 6' (182.9 cm) Weight: 90.7 kg (200 lb) IBW/kg (Calculated) : 77.6  Temp (24hrs), Avg:98.4 F (36.9 C), Min:98.4 F (36.9 C), Max:98.4 F (36.9 C)  Recent Labs  Lab 10/02/20 2224 10/02/20 2225  WBC 4.5  --   CREATININE 1.14  --   LATICACIDVEN  --  1.4    Estimated Creatinine Clearance: 83.2 mL/min (by C-G formula based on SCr of 1.14 mg/dL).    Allergies  Allergen Reactions  . Atorvastatin Other (See Comments)    Leg cramps and body aches    Chase Rambeau D. 10/04/20, PharmD, BCPS, BCCCP 10/03/2020, 9:34 AM

## 2020-10-03 NOTE — Anesthesia Preprocedure Evaluation (Addendum)
Anesthesia Evaluation  Patient identified by MRN, date of birth, ID band Patient awake    Reviewed: Allergy & Precautions, NPO status , Patient's Chart, lab work & pertinent test results  History of Anesthesia Complications Negative for: history of anesthetic complications  Airway Mallampati: II  TM Distance: >3 FB Neck ROM: Full    Dental  (+) Poor Dentition, Missing, Chipped, Dental Advisory Given   Pulmonary Current Smoker and Patient abstained from smoking.,  10/03/2020 SARS coronavirus NEG   breath sounds clear to auscultation       Cardiovascular (-) hypertension Rhythm:Regular Rate:Normal  '20 ECHO: EF 60-65%, no significant valvular abnormalities   Neuro/Psych Anxiety Depression Schizophrenia negative neurological ROS     GI/Hepatic (+) Cirrhosis     substance abuse (drug use (heroin 1/2-1gm almost daily))  IV drug use, Hepatitis -n/v   Endo/Other  diabetes (glu 168), Insulin Dependent  Renal/GU negative Renal ROS     Musculoskeletal  (+) narcotic dependent  Abdominal   Peds  Hematology Thrombocytopenia: plt 31k   Anesthesia Other Findings   Reproductive/Obstetrics                            Anesthesia Physical Anesthesia Plan  ASA: III  Anesthesia Plan: General   Post-op Pain Management:    Induction: Intravenous and Rapid sequence  PONV Risk Score and Plan: 1 and Ondansetron and Dexamethasone  Airway Management Planned: Oral ETT  Additional Equipment: None  Intra-op Plan:   Post-operative Plan: Extubation in OR  Informed Consent: I have reviewed the patients History and Physical, chart, labs and discussed the procedure including the risks, benefits and alternatives for the proposed anesthesia with the patient or authorized representative who has indicated his/her understanding and acceptance.     Dental advisory given  Plan Discussed with: CRNA and  Surgeon  Anesthesia Plan Comments:        Anesthesia Quick Evaluation

## 2020-10-03 NOTE — ED Notes (Signed)
Patient vomiting, states he thinks he is starting to go into opiate withdrawal. Attending notified.

## 2020-10-03 NOTE — ED Notes (Signed)
MRI made aware of morphine administration prior to transport so the can monitor LOC.

## 2020-10-03 NOTE — ED Notes (Signed)
Patient transported to MRI 

## 2020-10-03 NOTE — ED Notes (Signed)
Pt vomiting post oral meds

## 2020-10-03 NOTE — Patient Outreach (Signed)
[facility] met with Pt an was made aware that Pt will be going to another Unit. [facility]PSS left contact information an was able to  process with Pt about following up before his is discharged from the other Unit.

## 2020-10-04 ENCOUNTER — Encounter (HOSPITAL_COMMUNITY): Payer: Self-pay | Admitting: Orthopaedic Surgery

## 2020-10-04 ENCOUNTER — Other Ambulatory Visit: Payer: Self-pay

## 2020-10-04 DIAGNOSIS — F17210 Nicotine dependence, cigarettes, uncomplicated: Secondary | ICD-10-CM | POA: Diagnosis present

## 2020-10-04 DIAGNOSIS — F1120 Opioid dependence, uncomplicated: Secondary | ICD-10-CM | POA: Diagnosis not present

## 2020-10-04 DIAGNOSIS — Z79899 Other long term (current) drug therapy: Secondary | ICD-10-CM | POA: Diagnosis not present

## 2020-10-04 DIAGNOSIS — Z7984 Long term (current) use of oral hypoglycemic drugs: Secondary | ICD-10-CM | POA: Diagnosis not present

## 2020-10-04 DIAGNOSIS — L03113 Cellulitis of right upper limb: Secondary | ICD-10-CM | POA: Diagnosis present

## 2020-10-04 DIAGNOSIS — L02413 Cutaneous abscess of right upper limb: Secondary | ICD-10-CM | POA: Diagnosis present

## 2020-10-04 DIAGNOSIS — B9561 Methicillin susceptible Staphylococcus aureus infection as the cause of diseases classified elsewhere: Secondary | ICD-10-CM | POA: Diagnosis present

## 2020-10-04 DIAGNOSIS — Z888 Allergy status to other drugs, medicaments and biological substances status: Secondary | ICD-10-CM | POA: Diagnosis not present

## 2020-10-04 DIAGNOSIS — E1165 Type 2 diabetes mellitus with hyperglycemia: Secondary | ICD-10-CM | POA: Diagnosis present

## 2020-10-04 DIAGNOSIS — R161 Splenomegaly, not elsewhere classified: Secondary | ICD-10-CM | POA: Diagnosis present

## 2020-10-04 DIAGNOSIS — A4901 Methicillin susceptible Staphylococcus aureus infection, unspecified site: Secondary | ICD-10-CM | POA: Diagnosis not present

## 2020-10-04 DIAGNOSIS — K7460 Unspecified cirrhosis of liver: Secondary | ICD-10-CM | POA: Diagnosis present

## 2020-10-04 DIAGNOSIS — Z560 Unemployment, unspecified: Secondary | ICD-10-CM | POA: Diagnosis not present

## 2020-10-04 DIAGNOSIS — F1123 Opioid dependence with withdrawal: Secondary | ICD-10-CM | POA: Diagnosis present

## 2020-10-04 DIAGNOSIS — F17201 Nicotine dependence, unspecified, in remission: Secondary | ICD-10-CM | POA: Diagnosis not present

## 2020-10-04 DIAGNOSIS — B182 Chronic viral hepatitis C: Secondary | ICD-10-CM | POA: Diagnosis present

## 2020-10-04 DIAGNOSIS — E1140 Type 2 diabetes mellitus with diabetic neuropathy, unspecified: Secondary | ICD-10-CM | POA: Diagnosis present

## 2020-10-04 DIAGNOSIS — L0390 Cellulitis, unspecified: Secondary | ICD-10-CM | POA: Diagnosis not present

## 2020-10-04 DIAGNOSIS — Z20822 Contact with and (suspected) exposure to covid-19: Secondary | ICD-10-CM | POA: Diagnosis present

## 2020-10-04 DIAGNOSIS — D61818 Other pancytopenia: Secondary | ICD-10-CM | POA: Diagnosis present

## 2020-10-04 DIAGNOSIS — I10 Essential (primary) hypertension: Secondary | ICD-10-CM | POA: Diagnosis present

## 2020-10-04 DIAGNOSIS — Z7982 Long term (current) use of aspirin: Secondary | ICD-10-CM | POA: Diagnosis not present

## 2020-10-04 DIAGNOSIS — Z794 Long term (current) use of insulin: Secondary | ICD-10-CM | POA: Diagnosis not present

## 2020-10-04 LAB — GLUCOSE, CAPILLARY
Glucose-Capillary: 216 mg/dL — ABNORMAL HIGH (ref 70–99)
Glucose-Capillary: 213 mg/dL — ABNORMAL HIGH (ref 70–99)
Glucose-Capillary: 279 mg/dL — ABNORMAL HIGH (ref 70–99)
Glucose-Capillary: 187 mg/dL — ABNORMAL HIGH (ref 70–99)

## 2020-10-04 LAB — BASIC METABOLIC PANEL
Anion gap: 15 (ref 5–15)
BUN: 10 mg/dL (ref 6–20)
CO2: 24 mmol/L (ref 22–32)
Calcium: 9.5 mg/dL (ref 8.9–10.3)
Chloride: 101 mmol/L (ref 98–111)
Creatinine, Ser: 0.94 mg/dL (ref 0.61–1.24)
GFR, Estimated: >60 mL/min (ref >60)
Glucose, Bld: 198 mg/dL — ABNORMAL HIGH (ref 70–99)
Potassium: 4 mmol/L (ref 3.5–5.1)
Sodium: 140 mmol/L (ref 135–145)

## 2020-10-04 LAB — CBC
HCT: 35.8 % — ABNORMAL LOW (ref 39.0–52.0)
Hemoglobin: 12.9 g/dL — ABNORMAL LOW (ref 13.0–17.0)
MCH: 30.8 pg (ref 26.0–34.0)
MCHC: 36 g/dL (ref 30.0–36.0)
MCV: 85.4 fL (ref 80.0–100.0)
Platelets: 39 10*3/uL — ABNORMAL LOW (ref 150–400)
RBC: 4.19 MIL/uL — ABNORMAL LOW (ref 4.22–5.81)
RDW: 13.2 % (ref 11.5–15.5)
WBC: 7.4 10*3/uL (ref 4.0–10.5)
nRBC: 0 % (ref 0.0–0.2)

## 2020-10-04 LAB — HEMOGLOBIN A1C
Hgb A1c MFr Bld: 7.7 % — ABNORMAL HIGH (ref 4.8–5.6)
Mean Plasma Glucose: 174.29 mg/dL

## 2020-10-04 LAB — HIV ANTIBODY (ROUTINE TESTING W REFLEX): HIV Screen 4th Generation wRfx: NONREACTIVE

## 2020-10-04 MED ORDER — METHADONE HCL 10 MG PO TABS
20.0000 mg | ORAL_TABLET | Freq: Every day | ORAL | Status: DC
  Administered 2020-10-04 – 2020-10-08 (×5): 20 mg via ORAL
  Filled 2020-10-04 (×5): qty 2

## 2020-10-04 MED ORDER — HYDROCODONE-ACETAMINOPHEN 10-325 MG PO TABS
1.0000 | ORAL_TABLET | Freq: Once | ORAL | Status: AC
  Administered 2020-10-04: 1 via ORAL
  Filled 2020-10-04: qty 1

## 2020-10-04 MED ORDER — BUPRENORPHINE HCL-NALOXONE HCL 2-0.5 MG SL SUBL
2.0000 | SUBLINGUAL_TABLET | SUBLINGUAL | Status: DC | PRN
  Filled 2020-10-04: qty 2

## 2020-10-04 MED ORDER — LORAZEPAM 2 MG/ML IJ SOLN
1.0000 mg | Freq: Once | INTRAMUSCULAR | Status: AC
  Administered 2020-10-04: 1 mg via INTRAVENOUS
  Filled 2020-10-04: qty 1

## 2020-10-04 MED ORDER — BUPRENORPHINE HCL-NALOXONE HCL 8-2 MG SL SUBL: 1.0000 | SUBLINGUAL_TABLET | Freq: Two times a day (BID) | SUBLINGUAL | Status: DC

## 2020-10-04 MED ORDER — INSULIN GLARGINE 100 UNIT/ML ~~LOC~~ SOLN
10.0000 [IU] | Freq: Once | SUBCUTANEOUS | Status: AC
  Administered 2020-10-04: 10 [IU] via SUBCUTANEOUS
  Filled 2020-10-04: qty 0.1

## 2020-10-04 MED ORDER — INSULIN GLARGINE 100 UNIT/ML ~~LOC~~ SOLN
25.0000 [IU] | Freq: Every day | SUBCUTANEOUS | Status: DC
  Administered 2020-10-05 – 2020-10-08 (×4): 25 [IU] via SUBCUTANEOUS
  Filled 2020-10-04 (×4): qty 0.25

## 2020-10-04 MED ORDER — TRAZODONE HCL 100 MG PO TABS
100.0000 mg | ORAL_TABLET | Freq: Every day | ORAL | Status: DC
  Administered 2020-10-04 – 2020-10-08 (×5): 100 mg via ORAL
  Filled 2020-10-04 (×5): qty 1

## 2020-10-04 NOTE — Progress Notes (Signed)
   Ortho Hand Progress Note  Subjective: No acute events last night. Withdrawing from heroin. Pain in arm tolerable   Objective: Vital signs in last 24 hours: Temp:  [97.2 F (36.2 C)-98.4 F (36.9 C)] 98.3 F (36.8 C) (12/02 0801) Pulse Rate:  [57-99] 61 (12/02 0801) Resp:  [6-29] 20 (12/02 0801) BP: (134-188)/(85-129) 184/104 (12/02 0801) SpO2:  [95 %-100 %] 100 % (12/02 0801) Weight:  [88.9 kg-90.7 kg] 88.9 kg (12/01 2222)  Intake/Output from previous day: 12/01 0701 - 12/02 0700 In: 1423.2 [I.V.:1223.2; IV Piggyback:200] Out: 25 [Blood:25] Intake/Output this shift: Total I/O In: 200 [P.O.:200] Out: 950 [Urine:950]  Recent Labs    10/02/20 2224 10/04/20 0612  HGB 12.6* 12.9*   Recent Labs    10/02/20 2224 10/04/20 0612  WBC 4.5 7.4  RBC 4.12* 4.19*  HCT 36.8* 35.8*  PLT 31* 39*   Recent Labs    10/02/20 2224 10/04/20 0612  NA 135 140  K 4.7 4.0  CL 100 101  CO2 24 24  BUN 16 10  CREATININE 1.14 0.94  GLUCOSE 220* 198*  CALCIUM 9.1 9.5   No results for input(s): LABPT, INR in the last 72 hours.  Aaox3 nad Resp nonlabored RRR LUE: dressings in place. Decreased erythema to the visualized arm. Intact ain/pin/u. SILT m/u/r. Fingers wwp with bcr.   Assessment/Plan: 54 yo M with R forearm abscess s/p I&D, DOS 12/1  - Hospitalists primary. Appreciate management - PT hydrotherapy for continued cleansing of wound, packing and dressing changes - Cxs: pending - Abx per primary - RUE elevation, ice and digit mobilization  - Remainder per primary   Cain Saupe III 10/04/2020, 11:57 AM  [phone]

## 2020-10-04 NOTE — Progress Notes (Signed)
Physical Therapy Wound Treatment Patient Details  Name: Chase Reed MRN: [id number] Date of Birth: Apr 13, 1966  Today's Date: 10/04/2020 Time: 1010-1045 Time Calculation (min): 35 min  Subjective  Subjective: Endorses pain but pleasant and agreeable to hydrotherapy Patient and Family Stated Goals: Heal wound, go home Date of Onset: 10/01/20 Prior Treatments: I&D 10/03/2020  Pain Score: Premedicated however complaining of significant pain throughout session.   Wound Assessment     Wound / Incision (Open or Dehisced) 10/04/20 Incision - Open Arm Right Forearm (Active)  Wound Image   10/04/20 1400  Dressing Type Compression wrap;Gauze (Comment);Moist to dry 10/04/20 1400  Dressing Changed Changed 10/04/20 1400  Dressing Status Clean;Dry;Intact 10/04/20 1400  Dressing Change Frequency Daily 10/04/20 1400  Site / Wound Assessment Pale 10/04/20 1400  % Wound base Red or Granulating 100% 10/04/20 1400  % Wound base Yellow/Fibrinous Exudate 0% 10/04/20 1400  % Wound base Black/Eschar 0% 10/04/20 1400  % Wound base Other/Granulation Tissue (Comment) 0% 10/04/20 1400  Peri-wound Assessment Intact;Bleeding 10/04/20 1400  Wound Length (cm) 5.5 cm 10/04/20 1400  Wound Width (cm) 0.2 cm 10/04/20 1400  Wound Depth (cm) 3 cm 10/04/20 1400  Wound Volume (cm^3) 3.3 cm^3 10/04/20 1400  Wound Surface Area (cm^2) 1.1 cm^2 10/04/20 1400  Margins Unattached edges (unapproximated) 10/04/20 1400  Closure Sutures 10/04/20 1400  Drainage Amount Moderate 10/04/20 1400  Drainage Description Sanguineous 10/04/20 1400  Treatment Hydrotherapy (Pulse lavage);Packing (Impregnated strip) 10/04/20 1400   Hydrotherapy Pulsed lavage therapy - wound location: R forearm Pulsed Lavage with Suction (psi): 12 psi Pulsed Lavage with Suction - Normal Saline Used: 1000 mL Pulsed Lavage Tip: Tip with splash shield   Wound Assessment and Plan  Wound Therapy - Assess/Plan/Recommendations Wound Therapy - Clinical  Statement: Pt presents to hydrotherapy s/p I&D on 10/03/2020. Pain was a limiting factor but was able to tolerate some packing of the wound. Will continue to follow for pulse lavage to decrease bioburden, promote wound bed healing, and for discharge education.  Wound Therapy - Functional Problem List: Decreased gross and fine motor of R wrist and hand Factors Delaying/Impairing Wound Healing: Infection - systemic/local;Substance abuse Hydrotherapy Plan: Debridement;Dressing change;Patient/family education;Pulsatile lavage with suction Wound Therapy - Frequency: 6X / week Wound Therapy - Follow Up Recommendations: Home health RN Wound Plan: See above  Wound Therapy Goals- Improve the function of patient's integumentary system by progressing the wound(s) through the phases of wound healing (inflammation - proliferation - remodeling) by: Improve Drainage Characteristics: Min;Serous Improve Drainage Characteristics - Progress: Goal set today Patient/Family will be able to : demonstrate or instruct in proper dressing change technique with no cues from therapist.  Patient/Family Instruction Goal - Progress: Goal set today Time For Goal Achievement: 7 days Wound Therapy - Potential for Goals: Good  Goals will be updated until maximal potential achieved or discharge criteria met.  Discharge criteria: when goals achieved, discharge from hospital, MD decision/surgical intervention, no progress towards goals, refusal/missing three consecutive treatments without notification or medical reason.  GP     Thelma Comp 10/04/2020, 3:05 PM  Rolinda Roan, PT, DPT Acute Rehabilitation Services Pager: [phone] Office: [phone]

## 2020-10-04 NOTE — Progress Notes (Signed)
PROGRESS NOTE  Chase Reed ZOX:[id number] DOB: 1966/07/10 DOA: 10/02/2020 PCP: Courtney Heys, PA-C  Brief History   Patient is a 54 year old male with history of IV drug use (heroin 1/2-1gm almost daily), hepatitis C, pancytopenia, cirrhosis, diabetes mellitus type 2, essential hypertension presented to ED with right arm swelling, redness and pain for last 3 days.  Patient reported that he was injecting heroin in the right arm 3 days ago (on Sunday night), that was cut with water, when he missed the wean.  Next morning he started noticing redness in the right arm, which progressively worsened and continue to extend down to his wrist and to the right elbow.  Patient reported he had fevers and was having chills, constant throbbing pain in the right arm 10/10, worse with movement.  He however continue to use IV heroin, last use was last night before coming to ED.  Otherwise denies numbness, weakness or pain in the fingers.  No chest pain, shortness of breath, nausea vomiting or diarrhea. Vaccinated for COVID-19.  ED work-up/course:  In ED, temp 98.4, respiratory rate 16, BP lowest to 73/54, improved to 121/76 at the time of my examination, O2 sat 10% on room air WBC is 4.5, hemoglobin 12.6, hematocrit 36.8, platelets 31K (baseline in 30s)  [facility] have been consulted to admit the patient for further evaluation and treatment. Orthopedic surgery was consulted. The patient was taken for I&D of the extremity on 10/03/2020. Today the patient will go for hydrotherapy.  Consultants  Orthopedic surgery  Procedures  . I&D . Hydrotherapy  Antibiotics   Anti-infectives (From admission, onward)   Start     Dose/Rate Route Frequency Ordered Stop   10/04/20 1100  vancomycin (VANCOREADY) IVPB 1500 mg/300 mL        1,500 mg 150 mL/hr over 120 Minutes Intravenous Every 24 hours 10/03/20 0932     10/03/20 1200  vancomycin (VANCOCIN) IVPB 1000 mg/200 mL premix  Status:   Discontinued        1,000 mg 200 mL/hr over 60 Minutes Intravenous  Once 10/03/20 1147 10/03/20 1211   10/03/20 1200  ceFEPIme (MAXIPIME) 2 g in sodium chloride 0.9 % 100 mL IVPB  Status:  Discontinued        2 g 200 mL/hr over 30 Minutes Intravenous  Once 10/03/20 1147 10/03/20 1211   10/03/20 1000  ceFEPIme (MAXIPIME) 2 g in sodium chloride 0.9 % 100 mL IVPB        2 g 200 mL/hr over 30 Minutes Intravenous Every 8 hours 10/03/20 0932     12 /01/21 0945  vancomycin (VANCOREADY) IVPB 1750 mg/350 mL        1,750 mg 175 mL/hr over 120 Minutes Intravenous STAT 10/03/20 0932 10/03/20 1346   10/03/20 0500  vancomycin (VANCOREADY) IVPB 1750 mg/350 mL  Status:  Discontinued        1,750 mg 175 mL/hr over 120 Minutes Intravenous  Once 10/03/20 0449 10/03/20 0932   10/03/20 0445  cefTRIAXone (ROCEPHIN) 1 g in sodium chloride 0.9 % 100 mL IVPB        1 g 200 mL/hr over 30 Minutes Intravenous  Once 10/03/20 0444 10/03/20 0625    .  Subjective  The patient is dry heaving into a blue bag as I enter his room. His retching can be heard down the hall. He states that he is in withdrawal.  Objective   Vitals:  Vitals:   10/04/20 0620 10/04/20 0801  BP: (!) 172/85 (!) 184/104  Pulse: 72 61  Resp: 18 20  Temp: (!) 97.2 F (36.2 C) 98.3 F (36.8 C)  SpO2: 96% 100%   Exam:  Constitutional:  . The patient is awake, alert, and oriented x 3. Severe distress from dry heaves. Respiratory:  . No increased work of breathing. . No wheezes, rales, or rhonchi . No tactile fremitus Cardiovascular:  . Regular rate and rhythm . No murmurs, ectopy, or gallups. . No lateral PMI. No thrills. Abdomen:  . Abdomen is soft, non-tender, non-distended . No hernias, masses, or organomegaly . Normoactive bowel sounds.  Musculoskeletal:  . No cyanosis, clubbing, or edema . Right arm is bandaged. Skin:  . No rashes, lesions, ulcers . palpation of skin: no induration or nodules Neurologic:  . CN 2-12  intact . Sensation all 4 extremities intact Psychiatric:  . Mental status o Mood, affect appropriate o Orientation to person, place, time  . judgment and insight appear intact  I have personally reviewed the following:   Today's Data  . Vitals, CBC, BMP   Micro Data  . Wound culture: Gram stain positive for GPC. Culture has had no growth. . Blood culture x 2: No growth  Imaging  . X-ray elbow and wrist . MRI right forearm  Scheduled Meds: . [START ON 10/05/2020] buprenorphine-naloxone  1 tablet Sublingual BID  . cloNIDine  0.1 mg Oral QID   Followed by  . [START ON 10/06/2020] cloNIDine  0.1 mg Oral BH-qamhs   Followed by  . [START ON 10/08/2020] cloNIDine  0.1 mg Oral QAC breakfast  . gabapentin  600 mg Oral TID  . insulin aspart  0-5 Units Subcutaneous QHS  . insulin aspart  0-9 Units Subcutaneous TID WC  . insulin glargine  15 Units Subcutaneous Daily  . methadone  20 mg Oral Daily   Continuous Infusions: . ceFEPime (MAXIPIME) IV 2 g (10/04/20 1750)  . vancomycin 1,500 mg (10/04/20 1137)    Principal Problem:   Right arm cellulitis Active Problems:   Opioid use disorder, severe, dependence (HCC)   Cirrhosis (HCC)   Pancytopenia (HCC)   Nicotine dependence   Essential hypertension   Diabetes mellitus (HCC)   Cellulitis   LOS: 0 days   A & P   Right arm cellulitis with abscess, concern for necrotizing fasciitis, in the setting of IV drug use: Orthopedic surgery consulted. The patient has undergone operative I&D with irrigation due to ight wrist x-ray showed forearm swelling with cluster of gas bubbles in the ventral and lateral forearm.  The patient is to start hydrotherapy today.Continue IV fluids hydration, pain control. Gram stain positive for GPC. No growth from wound culture/blood cultures. Continue to elevate right arm, continue IV vancomycin and cefepime due to severe cellulitis and possible abscess.  Opioid use disorder, severe, dependence (HCC) with  continued IV heroin use: Patient counseled on substance abuse, he wants to quit but does not want to use methadone and wants to slowly taper down himself. The patient is currently in active and severe withdrawal. He has been started on suboxone. Also placed on clonidine detox protocol. Case management consult for substance abuse.   Known history of cirrhosis (HCC) from hep C, thrombocytopenia: Patient states that he has been treated for hepatitis C, follows Dr. Luciana Axe in ID clinic. His platelets close to his baseline (30-35K), follows Dr. Leonides Schanz in the oncology clinic.  Nicotine dependence: States 1 pack last a couple days, declined nicotine patch  Essential hypertension: BP currently stable, continue clonidine, however hold  off on lisinopril  Diabetes mellitus (HCC), type II, IDDM, uncontrolled with neuropathy:  Hold off on Metformin, Januvia. The patient's glucoses are currently controlled with Lantus, 25 units daily. He usually takes 30 units daily at home. Dose has been lowered as patient has been vomiting for 1/2 the day. He has also been started on sliding scale insulin.  I have seen and examined this patient myself. I have spent 35 minutes in his evaluation and care.  DVT prophylaxis: SCDs due to thrombocytopenia CODE STATUS: Full Code Family Communication: None available Disposition: Status is: Inpatient  Remains inpatient appropriate because:IV treatments appropriate due to intensity of illness or inability to take PO   Dispo: The patient is from: Home              Anticipated d/c is to: Home              Anticipated d/c date is: 2 days              Patient currently is not medically stable to d/c.  Lorie Cleckley, DO [facility] Direct contact: see [email or web address]  7PM-7AM contact night coverage as above 10/04/2020, 6:20 PM  LOS: 0 days

## 2020-10-05 DIAGNOSIS — L03113 Cellulitis of right upper limb: Secondary | ICD-10-CM | POA: Diagnosis not present

## 2020-10-05 LAB — GLUCOSE, CAPILLARY
Glucose-Capillary: 198 mg/dL — ABNORMAL HIGH (ref 70–99)
Glucose-Capillary: 240 mg/dL — ABNORMAL HIGH (ref 70–99)
Glucose-Capillary: 230 mg/dL — ABNORMAL HIGH (ref 70–99)
Glucose-Capillary: 272 mg/dL — ABNORMAL HIGH (ref 70–99)

## 2020-10-05 MED ORDER — SODIUM CHLORIDE 0.9 % IV SOLN
INTRAVENOUS | Status: DC | PRN
  Administered 2020-10-06: 250 mL via INTRAVENOUS
  Administered 2020-10-07: 1000 mL via INTRAVENOUS

## 2020-10-05 MED ORDER — AMLODIPINE BESYLATE 10 MG PO TABS
10.0000 mg | ORAL_TABLET | Freq: Every day | ORAL | Status: DC
  Administered 2020-10-05 – 2020-10-09 (×5): 10 mg via ORAL
  Filled 2020-10-05 (×5): qty 1

## 2020-10-05 NOTE — Progress Notes (Signed)
PROGRESS NOTE  OUSMAN DISE XMD:[id number] DOB: [birth year] DOA: 10/02/2020 PCP: Courtney Heys, PA-C  Brief History   Patient is a 54 year old male with history of IV drug use (heroin 1/2-1gm almost daily), hepatitis C, pancytopenia, cirrhosis, diabetes mellitus type 2, essential hypertension presented to ED with right arm swelling, redness and pain for last 3 days.  Patient reported that he was injecting heroin in the right arm 3 days ago (on Sunday night), that was cut with water, when he missed the wean.  Next morning he started noticing redness in the right arm, which progressively worsened and continue to extend down to his wrist and to the right elbow.  Patient reported he had fevers and was having chills, constant throbbing pain in the right arm 10/10, worse with movement.  He however continue to use IV heroin, last use was last night before coming to ED.  Otherwise denies numbness, weakness or pain in the fingers.  No chest pain, shortness of breath, nausea vomiting or diarrhea. Vaccinated for COVID-19.  ED work-up/course:  In ED, temp 98.4, respiratory rate 16, BP lowest to 73/54, improved to 121/76 at the time of my examination, O2 sat 10% on room air WBC is 4.5, hemoglobin 12.6, hematocrit 36.8, platelets 31K (baseline in 30s)  [facility] have been consulted to admit the patient for further evaluation and treatment. Orthopedic surgery was consulted. The patient was taken for I&D of the extremity on 10/03/2020. Today the patient will go for hydrotherapy.  The patient was having severe opioid withdrawals on 10/04/2020. This was treated with methadone. He was improved on 10/05/2020.  Consultants  Orthopedic surgery  Procedures  . I&D . Hydrotherapy  Antibiotics   Anti-infectives (From admission, onward)   Start     Dose/Rate Route Frequency Ordered Stop   10/04/20 1100  vancomycin (VANCOREADY) IVPB 1500 mg/300 mL        1,500 mg 150 mL/hr over 120 Minutes  Intravenous Every 24 hours 10/03/20 0932     10/03/20 1200  vancomycin (VANCOCIN) IVPB 1000 mg/200 mL premix  Status:  Discontinued        1,000 mg 200 mL/hr over 60 Minutes Intravenous  Once 10/03/20 1147 10/03/20 1211   10/03/20 1200  ceFEPIme (MAXIPIME) 2 g in sodium chloride 0.9 % 100 mL IVPB  Status:  Discontinued        2 g 200 mL/hr over 30 Minutes Intravenous  Once 10/03/20 1147 10/03/20 1211   10/03/20 1000  ceFEPIme (MAXIPIME) 2 g in sodium chloride 0.9 % 100 mL IVPB        2 g 200 mL/hr over 30 Minutes Intravenous Every 8 hours 10/03/20 0932     12 /01/21 0945  vancomycin (VANCOREADY) IVPB 1750 mg/350 mL        1,750 mg 175 mL/hr over 120 Minutes Intravenous STAT 10/03/20 0932 10/03/20 1346   10/03/20 0500  vancomycin (VANCOREADY) IVPB 1750 mg/350 mL  Status:  Discontinued        1,750 mg 175 mL/hr over 120 Minutes Intravenous  Once 10/03/20 0449 10/03/20 0932   10/03/20 0445  cefTRIAXone (ROCEPHIN) 1 g in sodium chloride 0.9 % 100 mL IVPB        1 g 200 mL/hr over 30 Minutes Intravenous  Once 10/03/20 0444 10/03/20 0625     Subjective  The patient is resting comfortably in bed. No new complaints.  Objective   Vitals:  Vitals:   10/05/20 1141 10/05/20 1342  BP: (!) 195/102 (!) 174/113  Pulse: 68 79  Resp: 18 18  Temp:    SpO2: 95% 98%   Exam:  Constitutional:  . The patient is awake, alert, and oriented x 3. Severe distress from dry heaves. Respiratory:  . No increased work of breathing. . No wheezes, rales, or rhonchi . No tactile fremitus Cardiovascular:  . Regular rate and rhythm . No murmurs, ectopy, or gallups. . No lateral PMI. No thrills. Abdomen:  . Abdomen is soft, non-tender, non-distended . No hernias, masses, or organomegaly . Normoactive bowel sounds.  Musculoskeletal:  . No cyanosis, clubbing, or edema . Right arm is bandaged. Skin:  . No rashes, lesions, ulcers . palpation of skin: no induration or nodules Neurologic:  . CN 2-12  intact . Sensation all 4 extremities intact Psychiatric:  . Mental status o Mood, affect appropriate o Orientation to person, place, time  . judgment and insight appear intact  I have personally reviewed the following:   Today's Data  . Oceanographer  . Wound culture: Gram stain positive for GPC. Culture has had no growth. . Blood culture x 2: No growth  Imaging  . X-ray elbow and wrist . MRI right forearm  Scheduled Meds: . amLODipine  10 mg Oral Daily  . cloNIDine  0.1 mg Oral QID   Followed by  . [START ON 10/06/2020] cloNIDine  0.1 mg Oral BH-qamhs   Followed by  . [START ON 10/08/2020] cloNIDine  0.1 mg Oral QAC breakfast  . gabapentin  600 mg Oral TID  . insulin aspart  0-5 Units Subcutaneous QHS  . insulin aspart  0-9 Units Subcutaneous TID WC  . insulin glargine  25 Units Subcutaneous Daily  . methadone  20 mg Oral Daily  . traZODone  100 mg Oral QHS   Continuous Infusions: . ceFEPime (MAXIPIME) IV 2 g (10/05/20 1024)  . vancomycin 1,500 mg (10/05/20 1126)    Principal Problem:   Right arm cellulitis Active Problems:   Opioid use disorder, severe, dependence (HCC)   Cirrhosis (HCC)   Pancytopenia (HCC)   Nicotine dependence   Essential hypertension   Diabetes mellitus (HCC)   Cellulitis   LOS: 1 day   A & P   Right arm cellulitis with abscess, concern for necrotizing fasciitis, in the setting of IV drug use: Orthopedic surgery consulted. The patient has undergone operative I&D with irrigation due to ight wrist x-ray showed forearm swelling with cluster of gas bubbles in the ventral and lateral forearm.  The patient is to start hydrotherapy today.Continue IV fluids hydration, pain control. Gram stain positive for GPC. No growth from wound culture/blood cultures. Continue to elevate right arm, continue IV vancomycin and cefepime due to severe cellulitis and possible abscess. Hydrotherapy continues.  Opioid use disorder, severe, dependence (HCC) with  continued IV heroin use: Patient counseled on substance abuse, he wants to quit but does not want to use methadone and wants to slowly taper down himself. The patient is currently in active and severe withdrawal. This was addressed with methadone. Improved today.  Known history of cirrhosis (HCC) from hep C, thrombocytopenia: Patient states that he has been treated for hepatitis C, follows Dr. Luciana Axe in ID clinic. His platelets close to his baseline (30-35K), follows Dr. Leonides Schanz in the oncology clinic.  Nicotine dependence: States 1 pack last a couple days, declined nicotine patch  Essential hypertension: BP currently stable, continue clonidine, however hold off on lisinopril  Diabetes mellitus (HCC), type II, IDDM, uncontrolled with neuropathy:  Hold off on Metformin, Januvia. The patient's glucoses are currently controlled with Lantus, 25 units daily. He usually takes 30 units daily at home. Dose has been lowered as patient has been vomiting for 1/2 the day. He has also been started on sliding scale insulin.  I have seen and examined this patient myself. I have spent 32 minutes in his evaluation and care.  DVT prophylaxis: SCDs due to thrombocytopenia CODE STATUS: Full Code Family Communication: None available Disposition: Status is: Inpatient  Remains inpatient appropriate because:IV treatments appropriate due to intensity of illness or inability to take PO  Dispo: The patient is from: Home              Anticipated d/c is to: Home              Anticipated d/c date is: 2 days              Patient currently is not medically stable to d/c.  Darria Corvera, DO [facility] Direct contact: see [email or web address]  7PM-7AM contact night coverage as above 10/05/2020, 2:38 PM  LOS: 0 days

## 2020-10-05 NOTE — Progress Notes (Deleted)
Patient complaining of being uncomfortable, wanting food and drink, saying that his dose of pain medication will not be enough for him. Patient has been resting and now that he is awake, he is uncomfortable.  RN paged MD about drink- can now have water. We will see how pain medication works. 

## 2020-10-05 NOTE — Progress Notes (Signed)
Physical Therapy Wound Treatment Patient Details  Name: Chase Reed MRN: [id number] Date of Birth: [birth year]  Today's Date: 10/05/2020 Time: 1102-1124 Time Calculation (min): 22 min  Subjective  Subjective: "I am going to listen to my music," pt stated. Used for relaxation Patient and Family Stated Goals: Heal wound, go home Date of Onset: 10/01/20 Prior Treatments: I&D 10/03/2020  Pain Score: Pain Score: 5   Wound Assessment  Wound / Incision (Open or Dehisced) 10/04/20 Incision - Open Arm Right Forearm (Active)  Dressing Type ABD;Compression wrap;Gauze (Comment);Moist to moist 10/05/20 1208  Dressing Changed Changed 10/05/20 1208  Dressing Status Clean;Dry;Intact 10/05/20 1208  Dressing Change Frequency Daily 10/05/20 1208  Site / Wound Assessment Clean 10/05/20 1208  % Wound base Red or Granulating 100% 10/05/20 1208  % Wound base Yellow/Fibrinous Exudate 0% 10/05/20 1208  % Wound base Black/Eschar 0% 10/05/20 1208  % Wound base Other/Granulation Tissue (Comment) 0% 10/05/20 1208  Peri-wound Assessment Intact;Bleeding 10/05/20 1208  Wound Length (cm) 5.5 cm 10/04/20 1400  Wound Width (cm) 0.2 cm 10/04/20 1400  Wound Depth (cm) 3 cm 10/04/20 1400  Wound Volume (cm^3) 3.3 cm^3 10/04/20 1400  Wound Surface Area (cm^2) 1.1 cm^2 10/04/20 1400  Margins Unattached edges (unapproximated) 10/05/20 1208  Closure Sutures 10/05/20 1208  Drainage Amount Moderate 10/05/20 1208  Drainage Description Sanguineous 10/05/20 1208  Treatment Hydrotherapy (Pulse lavage);Packing (Impregnated strip) 10/05/20 1208   Hydrotherapy Pulsed lavage therapy - wound location: R forearm Pulsed Lavage with Suction (psi): 4 psi (4-8) Pulsed Lavage with Suction - Normal Saline Used: 1000 mL Pulsed Lavage Tip: Tip with splash shield   Wound Assessment and Plan  Wound Therapy - Assess/Plan/Recommendations Wound Therapy - Clinical Statement: Pt tolerated pulsatile lavage and dressing change. Will  continue to follow for pulse lavage to decrease bioburden, promote wound bed healing, and for discharge education.  Wound Therapy - Functional Problem List: Decreased gross and fine motor of R wrist and hand Factors Delaying/Impairing Wound Healing: Infection - systemic/local;Substance abuse Hydrotherapy Plan: Debridement;Dressing change;Patient/family education;Pulsatile lavage with suction Wound Therapy - Frequency: 6X / week Wound Therapy - Follow Up Recommendations: Home health RN Wound Plan: See above  Wound Therapy Goals- Improve the function of patient's integumentary system by progressing the wound(s) through the phases of wound healing (inflammation - proliferation - remodeling) by: Improve Drainage Characteristics: Min;Serous Patient/Family will be able to : demonstrate or instruct in proper dressing change technique with no cues from therapist.   Goals will be updated until maximal potential achieved or discharge criteria met.  Discharge criteria: when goals achieved, discharge from hospital, MD decision/surgical intervention, no progress towards goals, refusal/missing three consecutive treatments without notification or medical reason.  Brooks 10/05/2020, 12:16 PM [place] Pager [phone] Office [phone]

## 2020-10-06 DIAGNOSIS — L03113 Cellulitis of right upper limb: Principal | ICD-10-CM

## 2020-10-06 LAB — GLUCOSE, CAPILLARY
Glucose-Capillary: 194 mg/dL — ABNORMAL HIGH (ref 70–99)
Glucose-Capillary: 217 mg/dL — ABNORMAL HIGH (ref 70–99)
Glucose-Capillary: 169 mg/dL — ABNORMAL HIGH (ref 70–99)
Glucose-Capillary: 224 mg/dL — ABNORMAL HIGH (ref 70–99)

## 2020-10-06 LAB — MRSA PCR SCREENING: MRSA by PCR: NEGATIVE

## 2020-10-06 MED ORDER — METRONIDAZOLE 500 MG PO TABS
500.0000 mg | ORAL_TABLET | Freq: Three times a day (TID) | ORAL | Status: DC
  Administered 2020-10-06 – 2020-10-08 (×6): 500 mg via ORAL
  Filled 2020-10-06 (×6): qty 1

## 2020-10-06 MED ORDER — VANCOMYCIN HCL IN DEXTROSE 1-5 GM/200ML-% IV SOLN
1000.0000 mg | Freq: Two times a day (BID) | INTRAVENOUS | Status: DC
  Administered 2020-10-06 – 2020-10-07 (×2): 1000 mg via INTRAVENOUS
  Filled 2020-10-06 (×2): qty 200

## 2020-10-06 NOTE — Progress Notes (Signed)
PROGRESS NOTE  Chase Reed:[id number] DOB: 1966/04/21 DOA: 10/02/2020 PCP: Courtney Heys, PA-C  Brief History   Patient is a 54 year old male with history of IV drug use (heroin 1/2-1gm almost daily), hepatitis C, pancytopenia, cirrhosis, diabetes mellitus type 2, essential hypertension presented to ED with right arm swelling, redness and pain for last 3 days.  Patient reported that he was injecting heroin in the right arm 3 days ago (on Sunday night), that was cut with water, when he missed the wean.  Next morning he started noticing redness in the right arm, which progressively worsened and continue to extend down to his wrist and to the right elbow.  Patient reported he had fevers and was having chills, constant throbbing pain in the right arm 10/10, worse with movement.  He however continue to use IV heroin, last use was last night before coming to ED.  Otherwise denies numbness, weakness or pain in the fingers.  No chest pain, shortness of breath, nausea vomiting or diarrhea. Vaccinated for COVID-19.  ED work-up/course:  In ED, temp 98.4, respiratory rate 16, BP lowest to 73/54, improved to 121/76 at the time of my examination, O2 sat 10% on room air WBC is 4.5, hemoglobin 12.6, hematocrit 36.8, platelets 31K (baseline in 30s)  [facility] have been consulted to admit the patient for further evaluation and treatment. Orthopedic surgery was consulted. The patient was taken for I&D of the extremity on 10/03/2020. Today the patient will go for hydrotherapy.  The patient was having severe opioid withdrawals on 10/04/2020. This was treated with methadone. He was improved on 10/05/2020.  The patient has tested negative for MRSA by PCR.  Infectious disease has been consulted to assist with antibiotic choice and duration of therapy. However wound culture has grown out staph aureus. Blood cultures have had no growth.  Consultants  Orthopedic surgery Infectious  disease.  Procedures  . I&D . Hydrotherapy  Antibiotics   Anti-infectives (From admission, onward)   Start     Dose/Rate Route Frequency Ordered Stop   10/06/20 1400  metroNIDAZOLE (FLAGYL) tablet 500 mg        500 mg Oral Every 8 hours 10/06/20 0922     10/04/20 1100  vancomycin (VANCOREADY) IVPB 1500 mg/300 mL  Status:  Discontinued        1,500 mg 150 mL/hr over 120 Minutes Intravenous Every 24 hours 10/03/20 0932 10/06/20 0838   10/03/20 1200  vancomycin (VANCOCIN) IVPB 1000 mg/200 mL premix  Status:  Discontinued        1,000 mg 200 mL/hr over 60 Minutes Intravenous  Once 10/03/20 1147 10/03/20 1211   10/03/20 1200  ceFEPIme (MAXIPIME) 2 g in sodium chloride 0.9 % 100 mL IVPB  Status:  Discontinued        2 g 200 mL/hr over 30 Minutes Intravenous  Once 10/03/20 1147 10/03/20 1211   10/03/20 1000  ceFEPIme (MAXIPIME) 2 g in sodium chloride 0.9 % 100 mL IVPB        2 g 200 mL/hr over 30 Minutes Intravenous Every 8 hours 10/03/20 0932     12 /01/21 0945  vancomycin (VANCOREADY) IVPB 1750 mg/350 mL        1,750 mg 175 mL/hr over 120 Minutes Intravenous STAT 10/03/20 0932 10/03/20 1346   10/03/20 0500  vancomycin (VANCOREADY) IVPB 1750 mg/350 mL  Status:  Discontinued        1,750 mg 175 mL/hr over 120 Minutes Intravenous  Once 10/03/20 0449 10/03/20 0932  10/03/20 0445  cefTRIAXone (ROCEPHIN) 1 g in sodium chloride 0.9 % 100 mL IVPB        1 g 200 mL/hr over 30 Minutes Intravenous  Once 10/03/20 0444 10/03/20 0625     Subjective  The patient is resting comfortably in bed. No new complaints.  Objective   Vitals:  Vitals:   10/06/20 0614 10/06/20 1049  BP: (!) 179/115 (!) 160/111  Pulse: 70 85  Resp:  14  Temp:  99 F (37.2 C)  SpO2:  95%   Exam:  Constitutional:  The patient is awake, alert, and oriented x 3.  No acute distress. Respiratory:  . No increased work of breathing. . No wheezes, rales, or rhonchi . No tactile fremitus Cardiovascular:  . Regular  rate and rhythm . No murmurs, ectopy, or gallups. . No lateral PMI. No thrills. Abdomen:  . Abdomen is soft, non-tender, non-distended . No hernias, masses, or organomegaly . Normoactive bowel sounds.  Musculoskeletal:  . No cyanosis, clubbing, or edema . Right arm is bandaged. Skin:  . No rashes, lesions, ulcers . palpation of skin: no induration or nodules Neurologic:  . CN 2-12 intact . Sensation all 4 extremities intact Psychiatric:  . Mental status o Mood, affect appropriate o Orientation to person, place, time  . judgment and insight appear intact  I have personally reviewed the following:   Today's Data  . Dispensing optician  . Wound culture: Gram stain positive for GPC. Culture has grown out staph . Blood culture x 2: No growth . PCR negative for MRSA  Imaging  . X-ray elbow and wrist . MRI right forearm  Scheduled Meds: . amLODipine  10 mg Oral Daily  . cloNIDine  0.1 mg Oral BH-qamhs   Followed by  . [START ON 10/08/2020] cloNIDine  0.1 mg Oral QAC breakfast  . gabapentin  600 mg Oral TID  . insulin aspart  0-5 Units Subcutaneous QHS  . insulin aspart  0-9 Units Subcutaneous TID WC  . insulin glargine  25 Units Subcutaneous Daily  . methadone  20 mg Oral Daily  . metroNIDAZOLE  500 mg Oral Q8H  . traZODone  100 mg Oral QHS   Continuous Infusions: . sodium chloride Stopped (10/06/20 0355)  . ceFEPime (MAXIPIME) IV 2 g (10/06/20 1027)    Principal Problem:   Right arm cellulitis Active Problems:   Opioid use disorder, severe, dependence (HCC)   Cirrhosis (HCC)   Pancytopenia (HCC)   Nicotine dependence   Essential hypertension   Diabetes mellitus (HCC)   Cellulitis   LOS: 2 days   A & P   Right arm cellulitis with abscess, concern for necrotizing fasciitis, in the setting of IV drug use: Orthopedic surgery consulted. The patient has undergone operative I&D with irrigation due to ight wrist x-ray showed forearm swelling with cluster of gas  bubbles in the ventral and lateral forearm.  The patient is to start hydrotherapy today.Continue IV fluids hydration, pain control. Gram stain positive for GPC. No growth from wound culture/blood cultures. Continue to elevate right arm, continue IV vancomycin and cefepime due to severe cellulitis and possible abscess. Hydrotherapy continues today, but will be on hold tomorrow due to increased induration. Infectious disease has been consulted for   Opioid use disorder, severe, dependence (HCC) with continued IV heroin use: Patient counseled on substance abuse, he wants to quit but does not want to use methadone and wants to slowly taper down himself. The patient is currently in  active and severe withdrawal. This was addressed with methadone. Improved today.  Known history of cirrhosis (HCC) from hep C, thrombocytopenia: Patient states that he has been treated for hepatitis C, follows Dr. Luciana Axe in ID clinic. His platelets close to his baseline (30-35K), follows Dr. Leonides Schanz in the oncology clinic.  Nicotine dependence: States 1 pack last a couple days, declined nicotine patch  Essential hypertension: BP currently stable, continue clonidine, however hold off on lisinopril  Diabetes mellitus (HCC), type II, IDDM, uncontrolled with neuropathy:  Hold off on Metformin, Januvia. The patient's glucoses are currently controlled with Lantus, 25 units daily. He usually takes 30 units daily at home. Dose has been lowered as patient has been vomiting for 1/2 the day. He has also been started on sliding scale insulin.  I have seen and examined this patient myself. I have spent 36 minutes in his evaluation and care.  DVT prophylaxis: SCDs due to thrombocytopenia CODE STATUS: Full Code Family Communication: None available Disposition: Status is: Inpatient  Remains inpatient appropriate because:IV treatments appropriate due to intensity of illness or inability to take PO  Dispo: The patient is from:  Home              Anticipated d/c is to: Home              Anticipated d/c date is: 2 days              Patient currently is not medically stable to d/c.  Justina Bertini, DO [facility] Direct contact: see [email or web address]  7PM-7AM contact night coverage as above 12/342021, 2:28 PM  LOS: 0 days

## 2020-10-06 NOTE — Plan of Care (Signed)

## 2020-10-06 NOTE — Progress Notes (Signed)
Pharmacy Antibiotic Note  Chase Reed is a 54 y.o. male admitted on 10/02/2020 with right arm pain.  Patient was injecting heroin and missed the vein.  Pharmacy has been consulted for vancomycin and cefepime dosing for cellulitis / wound infection.    SCr 0.94 (12/2), CrCL 100 ml/min, afebrile, WBC WNL  12/1 surgical wound >> stap aureus (sens pending)  Vancomycin was discontinued this AM.  ID was consulted who recommends continuing Vancomycin until wound sensitivities are back.  Plan: Vanc 1000mg  IV q12h for goal trough 10-15 mcg/mL Continue Cefepime 2gm IV Q8H Add Flagyl PO per ID recs. Monitor renal fxn, clinical progress, vanc trough as indicated Last BMET was 12/2, will order for AM to assess renal function.   14/2, Pharm.D., BCPS Clinical Pharmacist  **Pharmacist phone directory can be found on [email or web address] listed under [facility].  10/06/2020 3:13 PM

## 2020-10-06 NOTE — Consult Note (Signed)
[facility]                                                                                        Patient Identification: Patient Name: Chase Reed MRN: [id number] Admit Date: 10/02/2020 10:04 PM Today's Date: 10/06/2020 Reason for consult:   Principal Problem:   Right arm cellulitis Active Problems:   Opioid use disorder, severe, dependence (HCC)   Cirrhosis (HCC)   Pancytopenia (HCC)   Nicotine dependence   Essential hypertension   Diabetes mellitus (HCC)   Cellulitis   Antibiotics:  Cefepime Day 4, Vancomycin Day 4   Assessment 1. Rt proximal forearm abscess/cellulitis in the setting of active IV drug injection. S/p I and D on 10/03/20 with deeper fascial involvement with no involvement of muscle or bone. OR cx is growing Staph aureus- sensi is pending. MRSA PCR is positive.   2. Chronic hepatitis C - Previously treated with concerns of re-infection in the setting of IVDU. Patient follows up with Dr Luciana Axe at [facility]   Recommendations  Continue Vancomycin, pharmacy to dose until sensitivities are back Continue cefepime pending cultures to finalize. Will add metronidazole 500mg  PO TID for now until culture finalizes given gas seen in the MRI forearm for covering anaerobic organisms.  Monitor CBC, CMP and Vanc trough Patient will follow up with Dr for management of his Hepatitis C Following  Rest of the management as per the primary team. Please call with questions or concerns. Thank you for the consult   Luciana Axe, MD Infectious Diseases  [facility]   To contact the attending provider between 8A-5P or the covering provider during after hours 5P-8A, please log into the web site [email or web address] and access using universal [place] password for that web site. If you do not have the password, please call the hospital  operator. __________________________________________________________________________________________________________ HPI and Hospital Course: Patient is a 54 year old male with history of IV drug use (heroin 1/2-1gm almost daily), hepatitis C, pancytopenia, cirrhosis, diabetes mellitus type 2, essential hypertension presented to ED with right arm swelling, redness and pain for last 3 days.  Patient reported that he was injecting heroin in the right arm 3 days ago (on Sunday night), that was cut with water, when he missed the wean.  Next morning he started noticing redness in the right arm, which progressively worsened and continue to extend down to his wrist and to the right elbow.  Patient reported he had fevers and was having chills, constant throbbing pain in the right arm 10/10, worse with movement.  He however continue to use IV heroin, last use was last night before coming to ED.  Patient underwent Irrigation debridement of right forearm subfascial abscess measuring approximately 6 x 3 x 1.5 cm on 10/03/20.  FINDING: Large abscess deep to the fascia in the right forearm.  It measured 6 x 3 x 1.5 cm in size ROS: General- Denies fever, chills, loss of appetite and loss of weight HEENT - Denies headache, blurry vision, neck pain, sinus pain Chest - Denies any chest pain, SOB or cough CVS- Denies any dizziness/lightheadedness, syncopal attacks,  palpitations Abdomen- Denies any nausea, vomiting, abdominal pain, hematochezia and diarrhea Neuro - Denies any weakness, numbness, tingling sensation Psych - Denies any changes in mood irritability or depressive symptoms GU- Denies any burning, dysuria, hematuria or increased frequency of urination Skin - denies any rashes/lesions MSK - denies any joint pain/swelling or restricted ROM    Past Medical History:  Diagnosis Date  . Cirrhosis (HCC)   . Diabetes mellitus without complication (HCC)   . Hepatitis    Past Surgical History:  Procedure  Laterality Date  . APPLICATION OF WOUND VAC N/A 12/24/2018   Procedure: APPLICATION OF WOUND VAC;  Surgeon: Alleen Borne, MD;  Location: [facility] OR;  Service: Thoracic;  Laterality: N/A;  . APPLICATION OF WOUND VAC N/A 12/27/2018   Procedure: WOUND VAC CHANGE;  Surgeon: Alleen Borne, MD;  Location: [facility] OR;  Service: Thoracic;  Laterality: N/A;  . APPLICATION OF WOUND VAC N/A 01/14/2019   Procedure: APPLICATION OF WOUND VAC;  Surgeon: Alleen Borne, MD;  Location: [facility] OR;  Service: Vascular;  Laterality: N/A;  . I & D EXTREMITY N/A 01/14/2019   Procedure: DEBRIDEMENT OF STERNOCLAVICULAR JOINT;  Surgeon: Alleen Borne, MD;  Location: [facility] OR;  Service: Vascular;  Laterality: N/A;  . I & D EXTREMITY Right 10/03/2020   Procedure: Irrigation debridement of RIGHT forearm subfascial abscess measuring approximately 6 x 3 x 1.5 cm;  Surgeon: Ernest Mallick, MD;  Location: [facility] OR;  Service: Orthopedics;  Laterality: Right;  . LITHOTRIPSY    . STERNAL WOUND DEBRIDEMENT N/A 12/24/2018   Procedure: I&D CHEST WALL ABSCESS;  Surgeon: Alleen Borne, MD;  Location: [facility] OR;  Service: Thoracic;  Laterality: N/A;  . STERNAL WOUND DEBRIDEMENT N/A 12/27/2018   Procedure: DEBRIDEMENT OF CHEST WALL WOUND;  Surgeon: Alleen Borne, MD;  Location: [facility] OR;  Service: Thoracic;  Laterality: N/A;  . TEE WITHOUT CARDIOVERSION N/A 01/03/2019   Procedure: TRANSESOPHAGEAL ECHOCARDIOGRAM (TEE);  Surgeon: Thurmon Fair, MD;  Location: [facility] ENDOSCOPY;  Service: Cardiovascular;  Laterality: N/A;     Scheduled Meds: . amLODipine  10 mg Oral Daily  . cloNIDine  0.1 mg Oral BH-qamhs   Followed by  . [START ON 10/08/2020] cloNIDine  0.1 mg Oral QAC breakfast  . gabapentin  600 mg Oral TID  . insulin aspart  0-5 Units Subcutaneous QHS  . insulin aspart  0-9 Units Subcutaneous TID WC  . insulin glargine  25 Units Subcutaneous Daily  . methadone  20 mg Oral Daily  . traZODone  100 mg Oral QHS   Continuous Infusions: . sodium  chloride Stopped (10/06/20 0355)  . ceFEPime (MAXIPIME) IV Stopped (10/06/20 0312)   PRN Meds:.sodium chloride, dicyclomine, hydrOXYzine, loperamide, methocarbamol, morphine injection, naproxen, ondansetron (ZOFRAN) IV, ondansetron  Allergies  Allergen Reactions  . Atorvastatin Other (See Comments)    Leg cramps and body aches   Social History   Socioeconomic History  . Marital status: Married    Spouse name: Not on file  . Number of children: Not on file  . Years of education: Not on file  . Highest education level: Not on file  Occupational History  . Occupation: Software engineer  Tobacco Use  . Smoking status: Current Some Day Smoker    Packs/day: 0.25    Years: 30.00    Pack years: 7.50    Types: Cigarettes  . Smokeless tobacco: Former Clinical biochemist  . Vaping Use: Never used  Substance and Sexual Activity  .  Alcohol use: No  . Drug use: Yes    Types: Heroin, IV    Comment: Fentanyl and "Oxy" per patient   . Sexual activity: Never  Other Topics Concern  . Not on file  Social History Narrative  . Not on file   Social Determinants of Health   Financial Resource Strain:   . Difficulty of Paying Living Expenses: Not on file  Food Insecurity:   . Worried About Programme researcher, broadcasting/film/video in the Last Year: Not on file  . Ran Out of Food in the Last Year: Not on file  Transportation Needs:   . Lack of Transportation (Medical): Not on file  . Lack of Transportation (Non-Medical): Not on file  Physical Activity:   . Days of Exercise per Week: Not on file  . Minutes of Exercise per Session: Not on file  Stress:   . Feeling of Stress : Not on file  Social Connections:   . Frequency of Communication with Friends and Family: Not on file  . Frequency of Social Gatherings with Friends and Family: Not on file  . Attends Religious Services: Not on file  . Active Member of Clubs or Organizations: Not on file  . Attends Banker Meetings: Not on file  . Marital  Status: Not on file  Intimate Partner Violence:   . Fear of Current or Ex-Partner: Not on file  . Emotionally Abused: Not on file  . Physically Abused: Not on file  . Sexually Abused: Not on file     Vitals BP (!) 179/115 (BP Location: Left Arm)   Pulse 70   Temp 98.7 F (37.1 C) (Oral)   Resp 18   Ht 6' (1.829 m)   Wt 86 kg   SpO2 98%   BMI 25.71 kg/m    Physical Exam Constitutional:  average built, not in acute distress    Comments:   Cardiovascular:     Rate and Rhythm: Normal rate and regular rhythm.     Heart sounds:  Pulmonary:     Effort: Pulmonary effort is normal.     Comments:   Abdominal:     Palpations: Abdomen is soft.     Tenderness: Non tender  Musculoskeletal:        General: RT forearm is bandaged with no restriction in ROM   Skin:    Comments: No obvious lesions/rashes   Neurological:     General: No focal deficit present.   Psychiatric:        Mood and Affect: Mood normal.    Pertinent Microbiology Results for orders placed or performed during the hospital encounter of 10/02/20  Resp Panel by RT-PCR (Flu A&B, Covid) Nasopharyngeal Swab     Status: None   Collection Time: 10/03/20  5:12 AM   Specimen: Nasopharyngeal Swab; Nasopharyngeal(NP) swabs in vial transport medium  Result Value Ref Range Status   SARS Coronavirus 2 by RT PCR NEGATIVE NEGATIVE Final    Comment: (NOTE) SARS-CoV-2 target nucleic acids are NOT DETECTED.  The SARS-CoV-2 RNA is generally detectable in upper respiratory specimens during the acute phase of infection. The lowest concentration of SARS-CoV-2 viral copies this assay can detect is 138 copies/mL. A negative result does not preclude SARS-Cov-2 infection and should not be used as the sole basis for treatment or other patient management decisions. A negative result may occur with  improper specimen collection/handling, submission of specimen other than nasopharyngeal swab, presence of viral mutation(s)  within the areas targeted by  this assay, and inadequate number of viral copies(<138 copies/mL). A negative result must be combined with clinical observations, patient history, and epidemiological information. The expected result is Negative.  Fact Sheet for Patients:  BloggerCourse.com  Fact Sheet for Healthcare Providers:  SeriousBroker.it  This test is no t yet approved or cleared by the [place] FDA and  has been authorized for detection and/or diagnosis of SARS-CoV-2 by FDA under an Emergency Use Authorization (EUA). This EUA will remain  in effect (meaning this test can be used) for the duration of the COVID-19 declaration under Section 564(b)(1) of the Act, 21 U.S.C.section 360bbb-3(b)(1), unless the authorization is terminated  or revoked sooner.       Influenza A by PCR NEGATIVE NEGATIVE Final   Influenza B by PCR NEGATIVE NEGATIVE Final    Comment: (NOTE) The Xpert Xpress SARS-CoV-2/FLU/RSV plus assay is intended as an aid in the diagnosis of influenza from Nasopharyngeal swab specimens and should not be used as a sole basis for treatment. Nasal washings and aspirates are unacceptable for Xpert Xpress SARS-CoV-2/FLU/RSV testing.  Fact Sheet for Patients: BloggerCourse.com  Fact Sheet for Healthcare Providers: SeriousBroker.it  This test is not yet approved or cleared by the [place] FDA and has been authorized for detection and/or diagnosis of SARS-CoV-2 by FDA under an Emergency Use Authorization (EUA). This EUA will remain in effect (meaning this test can be used) for the duration of the COVID-19 declaration under Section 564(b)(1) of the Act, 21 U.S.C. section 360bbb-3(b)(1), unless the authorization is terminated or revoked.  Performed at [facility], [address] [place], [place] [address]   Aerobic/Anaerobic Culture (surgical/deep wound)      Status: None (Preliminary result)   Collection Time: 10/03/20  8:15 PM   Specimen: PATH Other; Tissue  Result Value Ref Range Status   Specimen Description WOUND  Final   Special Requests   Final    IRRIGATION AND DEBRIDEMENT RIGHT FOREARM ABCESS SPEC A   Gram Stain   Final    RARE WBC PRESENT, PREDOMINANTLY PMN FEW GRAM POSITIVE COCCI Performed at [facility], [address] [place], [place] [address]    Culture   Final    FEW STAPHYLOCOCCUS AUREUS SUSCEPTIBILITIES TO FOLLOW NO ANAEROBES ISOLATED; CULTURE IN PROGRESS FOR 5 DAYS    Report Status PENDING  Incomplete  Culture, blood (routine x 2)     Status: None (Preliminary result)   Collection Time: 10/04/20 11:06 AM   Specimen: BLOOD  Result Value Ref Range Status   Specimen Description BLOOD LEFT ANTECUBITAL  Final   Special Requests   Final    BOTTLES DRAWN AEROBIC AND ANAEROBIC Blood Culture adequate volume   Culture   Final    NO GROWTH < 24 HOURS Performed at [facility], [address] [place], [place] [address]    Report Status PENDING  Incomplete  Culture, blood (routine x 2)     Status: None (Preliminary result)   Collection Time: 10/04/20 11:07 AM   Specimen: BLOOD  Result Value Ref Range Status   Specimen Description BLOOD LEFT ANTECUBITAL  Final   Special Requests   Final    BOTTLES DRAWN AEROBIC AND ANAEROBIC Blood Culture adequate volume   Culture   Final    NO GROWTH < 24 HOURS Performed at [facility], [address], [place], [place] [address]    Report Status PENDING  Incomplete  MRSA PCR Screening  Status: None   Collection Time: 10/05/20  8:44 PM   Specimen: Nasal Mucosa; Nasopharyngeal  Result Value Ref Range Status   MRSA by PCR NEGATIVE NEGATIVE Final    Comment:        The GeneXpert MRSA Assay (FDA approved for NASAL specimens only), is one component of a comprehensive MRSA colonization surveillance program. It is not intended to diagnose MRSA infection nor  to guide or monitor treatment for MRSA infections. Performed at [facility], [address] [place], [place] [address]      Pertinent Lab seen by me: CBC Latest Ref Rng & Units 10/04/2020 10/02/2020 04/30/2020  WBC 4.0 - 10.5 K/uL 7.4 4.5 2.8(L)  Hemoglobin 13.0 - 17.0 g/dL 12.9(L) 12.6(L) 12.1(L)  Hematocrit 39 - 52 % 35.8(L) 36.8(L) 34.9(L)  Platelets 150 - 400 K/uL 39(L) 31(L) 46(L)   CMP Latest Ref Rng & Units 10/04/2020 10/02/2020 04/30/2020  Glucose 70 - 99 mg/dL 213(Y198(H) 865(H220(H) 846(N105(H)  BUN 6 - 20 mg/dL 10 16 18   Creatinine 0.61 - 1.24 mg/dL 6.290.94 5.281.14 4.131.06  Sodium 135 - 145 mmol/L 140 135 140  Potassium 3.5 - 5.1 mmol/L 4.0 4.7 4.2  Chloride 98 - 111 mmol/L 101 100 102  CO2 22 - 32 mmol/L 24 24 28   Calcium 8.9 - 10.3 mg/dL 9.5 9.1 9.6  Total Protein 6.5 - 8.1 g/dL - 7.5 8.0  Total Bilirubin 0.3 - 1.2 mg/dL - 0.9 0.5  Alkaline Phos 38 - 126 U/L - 110 90  AST 15 - 41 U/L - 62(H) 19  ALT 0 - 44 U/L - 62(H) 17    Pertinent Imagings/Other Imagings Plain films and CT images have been personally visualized and interpreted; radiology reports have been reviewed. Decision making incorporated into the Impression / Recommendations.   MRI Rt forearm WWO contrast 10/03/2020   FINDINGS: Bones/Joint/Cartilage  No signal abnormalities to suggest trauma or infection. Joint spaces of the right elbow and right wrist appear well preserved. No joint effusion.  Ligaments  Intact  Muscles and Tendons  There is edema and mild enhancement within the superficial aspect of the brachioradialis muscle in the proximal forearm, likely related to the overlying inflammatory process. Remaining muscular structures are intact.  Soft tissues  There is a rim enhancing fluid collection within the proximal right forearm ventral radial aspect, measuring 3.1 x 1.6 cm in transverse direction and extending approximately 6.2 cm in length. Punctate low signal foci within the collection are  compatible with gas seen on recent x-ray. Findings are consistent with abscess. The fluid collection is located approximately 1 cm deep to the skin surface.  Edema seen in the subcutaneous fat surrounding the abscess.  IMPRESSION: 1. Rim enhancing abscess with internal gas within the radial aspect of the proximal forearm, corresponding to the abnormality seen on recent x-ray. 2. Subcutaneous edema surrounding the abscess, as well as edema and enhancement of the adjacent underlying brachioradialis muscle. There is no evidence of intramuscular abscess or fluid collection at this time.  Xray RT Wrist 10/03/20  FINDINGS: Subcutaneous reticulation in the mid to distal forearm few bubbles of gas clustered ventrally and laterally.  No bony erosion.  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  Remote boxer's fracture.  IMPRESSION: Forearm swelling with a cluster of gas bubbles in the ventral and lateral forearm.  Rt elbow Xray 10/03/20 FINDINGS: Subcutaneous reticulation and swollen appearance of the proximal forearm and medial to the elbow. No joint effusion, erosion, or malalignment. No soft tissue emphysema.  IMPRESSION: Forearm swelling without soft tissue emphysema or elbow joint effusion.  I have spent greater than 60 minutes for this patient encounter including review of prior medical records with greater than 50% of time being face to face and coordination of their care.

## 2020-10-06 NOTE — Progress Notes (Signed)
Physical Therapy Wound Treatment Patient Details  Name: Chase Reed MRN: [id number] Date of Birth: [birth year]  Today's Date: 10/06/2020 Time: 7943-2761 Time Calculation (min): 21 min  Subjective  Subjective: How much longer do I have to do this Patient and Family Stated Goals: Heal wound, go home Date of Onset: 10/01/20 Prior Treatments: I&D 10/03/2020  Pain Score:  Significant. Pt premedicated.  Wound Assessment  Wound / Incision (Open or Dehisced) 10/04/20 Incision - Open Arm Right Forearm (Active)  Dressing Type ABD;Compression wrap;Moist to moist;Gauze (Comment) 10/06/20 0951  Dressing Changed Changed 10/06/20 0951  Dressing Status Clean;Dry;Intact 10/06/20 0951  Dressing Change Frequency Daily 10/06/20 0951  Site / Wound Assessment Clean 10/06/20 0951  % Wound base Red or Granulating 100% 10/06/20 0951  % Wound base Yellow/Fibrinous Exudate 0% 10/06/20 0951  % Wound base Black/Eschar 0% 10/06/20 0951  % Wound base Other/Granulation Tissue (Comment) 0% 10/06/20 0951  Peri-wound Assessment Induration 10/06/20 0951  Wound Length (cm) 5.5 cm 10/04/20 1400  Wound Width (cm) 0.2 cm 10/04/20 1400  Wound Depth (cm) 3 cm 10/04/20 1400  Wound Volume (cm^3) 3.3 cm^3 10/04/20 1400  Wound Surface Area (cm^2) 1.1 cm^2 10/04/20 1400  Margins Unattached edges (unapproximated) 10/06/20 0951  Closure Sutures 10/06/20 0951  Drainage Amount Moderate 10/06/20 0951  Drainage Description Sanguineous 10/06/20 0951  Treatment Hydrotherapy (Pulse lavage);Packing (Impregnated strip) 10/06/20 0951   Hydrotherapy Pulsed lavage therapy - wound location: R forearm Pulsed Lavage with Suction (psi): 4 psi (4-8) Pulsed Lavage with Suction - Normal Saline Used: 1000 mL Pulsed Lavage Tip: Tip with splash shield   Wound Assessment and Plan  Wound Therapy - Assess/Plan/Recommendations Wound Therapy - Clinical Statement: Continues to have moderate bloody drainage. Seems more indurated in periwound. No  hydrotherapy tomorrow. Nursing to change tomorrow.  Will continue to follow for pulse lavage to decrease bioburden, promote wound bed healing, and for discharge education.  Wound Therapy - Functional Problem List: Decreased gross and fine motor of R wrist and hand Factors Delaying/Impairing Wound Healing: Infection - systemic/local;Substance abuse Hydrotherapy Plan: Debridement;Dressing change;Patient/family education;Pulsatile lavage with suction Wound Therapy - Frequency: 6X / week Wound Therapy - Follow Up Recommendations: Home health RN Wound Plan: See above  Wound Therapy Goals- Improve the function of patient's integumentary system by progressing the wound(s) through the phases of wound healing (inflammation - proliferation - remodeling) by: Improve Drainage Characteristics: Min;Serous Improve Drainage Characteristics - Progress: Progressing toward goal Patient/Family will be able to : demonstrate or instruct in proper dressing change technique with no cues from therapist.  Patient/Family Instruction Goal - Progress: Progressing toward goal  Goals will be updated until maximal potential achieved or discharge criteria met.  Discharge criteria: when goals achieved, discharge from hospital, MD decision/surgical intervention, no progress towards goals, refusal/missing three consecutive treatments without notification or medical reason.  GP     Shary Decamp [name] 10/06/2020, 9:55 AM

## 2020-10-07 ENCOUNTER — Inpatient Hospital Stay (HOSPITAL_COMMUNITY): Payer: Medicaid Other

## 2020-10-07 DIAGNOSIS — L03113 Cellulitis of right upper limb: Secondary | ICD-10-CM | POA: Diagnosis not present

## 2020-10-07 LAB — BASIC METABOLIC PANEL
Anion gap: 10 (ref 5–15)
BUN: 19 mg/dL (ref 6–20)
CO2: 24 mmol/L (ref 22–32)
Calcium: 9.7 mg/dL (ref 8.9–10.3)
Chloride: 101 mmol/L (ref 98–111)
Creatinine, Ser: 0.91 mg/dL (ref 0.61–1.24)
GFR, Estimated: >60 mL/min (ref >60)
Glucose, Bld: 195 mg/dL — ABNORMAL HIGH (ref 70–99)
Potassium: 3.9 mmol/L (ref 3.5–5.1)
Sodium: 135 mmol/L (ref 135–145)

## 2020-10-07 LAB — GLUCOSE, CAPILLARY
Glucose-Capillary: 214 mg/dL — ABNORMAL HIGH (ref 70–99)
Glucose-Capillary: 248 mg/dL — ABNORMAL HIGH (ref 70–99)
Glucose-Capillary: 233 mg/dL — ABNORMAL HIGH (ref 70–99)
Glucose-Capillary: 217 mg/dL — ABNORMAL HIGH (ref 70–99)

## 2020-10-07 MED ORDER — LISINOPRIL 10 MG PO TABS
10.0000 mg | ORAL_TABLET | Freq: Every day | ORAL | Status: DC
  Administered 2020-10-07 – 2020-10-09 (×3): 10 mg via ORAL
  Filled 2020-10-07 (×3): qty 1

## 2020-10-07 MED ORDER — CEFAZOLIN SODIUM-DEXTROSE 2-4 GM/100ML-% IV SOLN
2.0000 g | Freq: Three times a day (TID) | INTRAVENOUS | Status: DC
  Administered 2020-10-07 – 2020-10-08 (×3): 2 g via INTRAVENOUS
  Filled 2020-10-07 (×3): qty 100

## 2020-10-07 NOTE — Plan of Care (Signed)
Patient is progressing on all care plan goals at this time.

## 2020-10-07 NOTE — Progress Notes (Signed)
PROGRESS NOTE  Chase Reed OMB:[id number] DOB: [birth year] DOA: 10/02/2020 PCP: Reyes Ivan, PA-C  Brief History   Patient is a 54 year old male with history of IV drug use (heroin 1/2-1gm almost daily), hepatitis C, pancytopenia, cirrhosis, diabetes mellitus type 2, essential hypertension presented to ED with right arm swelling, redness and pain for last 3 days.  Patient reported that he was injecting heroin in the right arm 3 days ago (on _0 /02/21 1100  vancomycin (VANCOREADY) IVPB 1500 mg/300 mL  Status:  Discontinued        1,500 mg 150 mL/hr over 120 Minutes Intravenous Every 24 hours 10/03/20 0932 10/06/20 0838   10/03/20 1200  vancomycin (VANCOCIN) IVPB 1000 mg/200 mL premix  Status:  Discontinued        1,000 mg 200 mL/hr over 60 Minutes Intravenous  Once 10/03/20 1147 10/03/20 1211   10/03/20 1200  ceFEPIme (MAXIPIME) 2 g in sodium chloride 0.9 % 100 mL IVPB  Status:  Discontinued        2 g 200 mL/hr over 30 Minutes Intravenous  Once 10/03/20 1147 10/03/20 1211   10/03/20 1000  ceFEPIme (MAXIPIME) 2 g in sodium chloride 0.9 % 100 mL IVPB  Status:  Discontinued        2 g 200 mL/hr over 30 Minutes Intravenous  Every 8 hours 10/03/20 0932 10/07/20 1351   10/03/20 0945  vancomycin (VANCOREADY) IVPB 1750 mg/350 mL        1,750 mg 175 mL/hr over 120 Minutes Intravenous STAT 10/03/20 0932 10/03/20 1346   10/03/20 0500  vancomycin (VANCOREADY) IVPB 1750 mg/350 mL  Status:  Discontinued        1,750 mg 175 mL/hr over 120 Minutes Intravenous  Once 10/03/20 0449 10/03/20 0932   10/03/20 0445  cefTRIAXone (ROCEPHIN) 1 g in sodium chloride 0.9 % 100 mL IVPB        1 g 200 mL/hr over 30 Minutes Intravenous  Once 10/03/20 0444 10/03/20 0625     Subjective  The patient is resting comfortably in bed. He is complaining of increased pain in his right  arm.  Objective   Vitals:  Vitals:   10/07/20 0837 10/07/20 1158  BP: (!) 160/104 (!) 147/106  Pulse:  (!) 105  Resp:  20  Temp:  98.4 F (36.9 C)  SpO2:  96%   Exam:  Constitutional:  The patient is awake, alert, and oriented x 3.  No acute distress. Respiratory:  . No increased work of breathing. . No wheezes, rales, or rhonchi . No tactile fremitus Cardiovascular:  . Regular rate and rhythm . No murmurs, ectopy, or gallups. . No lateral PMI. No thrills. Abdomen:  . Abdomen is soft, non-tender, non-distended . No hernias, masses, or organomegaly . Normoactive bowel sounds.  Musculoskeletal:  . No cyanosis, clubbing, or edema . Right arm is bandaged. Skin:  . No rashes, lesions, ulcers . palpation of skin: no induration or nodules Neurologic:  . CN 2-12 intact . Sensation all 4 extremities intact Psychiatric:  . Mental status o Mood, affect appropriate o Orientation to person, place, time  . judgment and insight appear intact  I have personally reviewed the following:   Today's Data  . Vitals. BMP  Micro Data  . Wound culture: Gram stain positive for GPC. Culture has grown out staph . Blood culture x 2: No growth . PCR negative for MRSA  Imaging  . X-ray elbow and wrist . MRI right forearm  Scheduled Meds: . amLODipine  10 mg Oral Daily  . cloNIDine  0.1 mg Oral BH-qamhs   Followed by  . [START ON 10/08/2020] cloNIDine  0.1 mg Oral QAC breakfast  . gabapentin  600 mg Oral TID  . insulin aspart  0-5 Units Subcutaneous QHS  . insulin aspart  0-9 Units Subcutaneous TID WC  . insulin glargine  25 Units Subcutaneous Daily  . lisinopril  10 mg Oral Daily  . methadone  20 mg Oral Daily  . metroNIDAZOLE  500 mg Oral Q8H  . traZODone  100 mg Oral QHS   Continuous Infusions: . sodium chloride 1,000 mL (10/07/20 0852)  .  ceFAZolin (ANCEF) IV      Principal Problem:   Right arm cellulitis Active Problems:   Opioid use disorder, severe, dependence  (HCC)   Cirrhosis (HCC)   Pancytopenia (HCC)   Nicotine dependence   Essential hypertension   Diabetes mellitus ([place])   Cellulitis   LOS: 3 days   A & P   Right arm cellulitis with abscess, concern for necrotizing fasciitis, in the setting of IV drug use: Orthopedic surgery consulted. The patient has undergone operative I&D with irrigation due to ight wrist x-ray showed forearm swelling with cluster of gas bubbles in the ventral and lateral forearm.  The patient is to start hydrotherapy today.Continue  IV fluids hydration, pain control. Gram stain positive for GPC. No growth from wound culture/blood cultures. Continue to elevate right arm, continue IV vancomycin and cefepime due to severe cellulitis and possible abscess. Hydrotherapy continues today, but will be on hold tomorrow due to increased induration. Infectious disease has been consulted for antibiotic choice and duration. Today they have narrowed antibiotic coverage from vancomycin and cefepime to ancef as susceptibilities have demonstratred that the organism is pan sensitive.   Opioid use disorder, severe, dependence ([place]) with continued IV heroin use: Patient counseled on substance abuse, he wants to quit but does not want to use methadone and wants to slowly taper down himself. The patient is currently in active and severe withdrawal. This was addressed with methadone. Patient states that he feels that he is nearly past withdrawal symptoms. LCSW has met with the patient and offered him assistance with rehab from opiates. He has declined this help.   Known history of cirrhosis (HCC) from hep C, thrombocytopenia: Patient states that he has been treated for hepatitis C, follows Dr. Linus Salmons in [employer] clinic. His continued IVDU is a continued risk for reinfection despite treatment. His platelets close to his baseline (30-35K), follows Dr. Lorenso Courier in the oncology clinic.  Nicotine dependence: States 1 pack last a couple days, declined nicotine  patch  Essential hypertension: BP currently stable, continue clonidine, however hold off on lisinopril  Diabetes mellitus ([place]), type II, IDDM, uncontrolled with neuropathy:  Hold off on Metformin, Januvia. The patient's glucoses are currently controlled with Lantus, 25 units daily. He usually takes 30 units daily at home. Dose has been lowered as patient has been vomiting for 1/2 the day. He has also been started on sliding scale insulin.  I have seen and examined this patient myself. I have spent 32 minutes in his evaluation and care.  DVT prophylaxis: SCDs due to thrombocytopenia CODE STATUS: Full Code Family Communication: None available Disposition: Status is: Inpatient  Remains inpatient appropriate because:IV treatments appropriate due to intensity of illness or inability to take PO  Dispo: The patient is from: Home              Anticipated d/c is to: Home              Anticipated d/c date is: 2 days              Patient currently is not medically stable to d/c.   , DO [facility] Direct contact: see [email or web address]  7PM-7AM contact night coverage as above 10/07/2020, 2:05 PM  LOS: 0 days

## 2020-10-07 NOTE — Progress Notes (Addendum)
CSW received consult for substance use/education for patient. CSW spoke with patient at bedside and offered patient resources for outpatient substance use treatment services. Patient declined.No further questions reported at this time.   CSW will continue to follow

## 2020-10-07 NOTE — Progress Notes (Signed)
Upon initial assessment this morning at 0800, patient was alert and oriented x 4. Respirations even and unlabored, lung sounds clear throughout, good volume noted. Patient has dressing in place to right forearm area; kerlix and ACE bandage with no drainage noted. PIV in left forearm is flushing well but has no blood return. At first glance this are looks infiltrated and edematous; however, on closer examination, the area is neither, it was just the way it was resting on the bed. Patient has normoactive bowel sounds and though his last BM was two days ago, hw stated that he feels he will have to go later today. Patient is using a urinal without difficulty and has received IV antibiotics without adverse effects. Patient's OR sample for culture has begun to grow MSSA; therefore, ID discontinued both Vanco and cefepime and started ancef. Patient was notified of these findings and feels relieved that it was not MRSA at this time. Patient continues to complain of generalized pain and has required PRN morphine an d robaxin as ordered, with sufficient relief.New order for Lisinopril 10mg  started today for persistent hypertensive readings, and patient states he takes this med at home as needed. He also continues to require sliding scale coverage despite am Lantus administration. Call light is in reach, will continue to monitor.

## 2020-10-07 NOTE — Progress Notes (Signed)
ID Brief Note  Cultures reviewed. 12/1/OR cultures growing MSSA in day 4.   DC Vancomycin and ceftriaxone   Start cefazolin   Pharmacy consult order in with above recommendations.  ID team will follow Monday  Odette Fraction, MD Infectious Diseases [facility]

## 2020-10-08 DIAGNOSIS — A4901 Methicillin susceptible Staphylococcus aureus infection, unspecified site: Secondary | ICD-10-CM | POA: Diagnosis not present

## 2020-10-08 DIAGNOSIS — L03113 Cellulitis of right upper limb: Secondary | ICD-10-CM | POA: Diagnosis not present

## 2020-10-08 DIAGNOSIS — F1120 Opioid dependence, uncomplicated: Secondary | ICD-10-CM | POA: Diagnosis not present

## 2020-10-08 DIAGNOSIS — F17201 Nicotine dependence, unspecified, in remission: Secondary | ICD-10-CM

## 2020-10-08 DIAGNOSIS — B9561 Methicillin susceptible Staphylococcus aureus infection as the cause of diseases classified elsewhere: Secondary | ICD-10-CM | POA: Diagnosis not present

## 2020-10-08 LAB — AEROBIC/ANAEROBIC CULTURE W GRAM STAIN (SURGICAL/DEEP WOUND)

## 2020-10-08 LAB — GLUCOSE, CAPILLARY
Glucose-Capillary: 215 mg/dL — ABNORMAL HIGH (ref 70–99)
Glucose-Capillary: 279 mg/dL — ABNORMAL HIGH (ref 70–99)
Glucose-Capillary: 221 mg/dL — ABNORMAL HIGH (ref 70–99)
Glucose-Capillary: 149 mg/dL — ABNORMAL HIGH (ref 70–99)

## 2020-10-08 MED ORDER — INSULIN GLARGINE 100 UNIT/ML ~~LOC~~ SOLN
30.0000 [IU] | Freq: Every day | SUBCUTANEOUS | Status: DC
  Administered 2020-10-09: 30 [IU] via SUBCUTANEOUS
  Filled 2020-10-08: qty 0.3

## 2020-10-08 MED ORDER — METHADONE HCL 10 MG PO TABS
30.0000 mg | ORAL_TABLET | Freq: Every day | ORAL | Status: DC
  Administered 2020-10-09: 30 mg via ORAL
  Filled 2020-10-08: qty 3

## 2020-10-08 MED ORDER — METHADONE HCL 10 MG PO TABS
10.0000 mg | ORAL_TABLET | Freq: Once | ORAL | Status: AC
  Administered 2020-10-08: 10 mg via ORAL
  Filled 2020-10-08: qty 1

## 2020-10-08 MED ORDER — METHOCARBAMOL 500 MG PO TABS
500.0000 mg | ORAL_TABLET | Freq: Three times a day (TID) | ORAL | Status: DC | PRN
  Administered 2020-10-08: 500 mg via ORAL
  Filled 2020-10-08: qty 1

## 2020-10-08 MED ORDER — SODIUM CHLORIDE 0.9 % IV SOLN
3.0000 g | Freq: Four times a day (QID) | INTRAVENOUS | Status: DC
  Administered 2020-10-08 – 2020-10-09 (×4): 3 g via INTRAVENOUS
  Filled 2020-10-08 (×6): qty 8

## 2020-10-08 NOTE — Progress Notes (Signed)
Inpatient Diabetes Program Recommendations  AACE/ADA: New Consensus Statement on Inpatient Glycemic Control (2015)  Target Ranges:  Prepandial:   less than 140 mg/dL      Peak postprandial:   less than 180 mg/dL (1-2 hours)      Critically ill patients:  140 - 180 mg/dL   Lab Results  Component Value Date   GLUCAP 279 (H) 10/08/2020   HGBA1C 7.7 (H) 10/04/2020    Review of Glycemic Control Results for MINORU, CHAP (MRN [id number]) as of 10/08/2020 15:14  Ref. Range 10/07/2020 16:19 10/07/2020 21:32 10/08/2020 05:25 10/08/2020 11:14  Glucose-Capillary Latest Ref Range: 70 - 99 mg/dL 947 (H) 096 (H) 283 (H) 279 (H)   Diabetes history: Type 2 DM Outpatient Diabetes medications: Lantus 30 units QD, Novolog 2-8 units TID, Metformin 1000 mg BID, Januvia 10 mg QD Current orders for Inpatient glycemic control: Lantus 30 units QD, Novolog 0-9 units TID, Novolog 0-5 units QHS  Inpatient Diabetes Program Recommendations:    Consider adding Novolog 3 units TID (assuming patient is consuming >50% of meal).   Thanks, Lujean Rave, MSN, RNC-OB Diabetes Coordinator [phone] (8a-5p)

## 2020-10-08 NOTE — Progress Notes (Signed)
Physical Therapy Wound Treatment Patient Details  Name: Chase Reed MRN: [id number] Date of Birth: 1966/02/28  Today's Date: 10/08/2020 Time: 1000-1023 Time Calculation (min): 23 min  Subjective  Subjective: Endorses pain but pleasant and agreeable to hydrotherapy Patient and Family Stated Goals: Heal wound, go home Date of Onset: 10/01/20 Prior Treatments: I&D 10/03/2020  Pain Score: Premedicated with morphine and tolerated treatment with faces pain scale 6/10  Wound Assessment  Negative Pressure Wound Therapy Chest Anterior;Mid (Active)     Wound / Incision (Open or Dehisced) 10/04/20 Incision - Open Arm Right Forearm (Active)  Dressing Type ABD;Compression wrap;Gauze (Comment);Moist to dry 10/08/20 1030  Dressing Changed Changed 10/08/20 1030  Dressing Status Clean;Dry;Intact 10/08/20 1030  Dressing Change Frequency Daily 10/08/20 1030  Site / Wound Assessment Clean (assessed from what was visible) 10/08/20 1030  % Wound base Red or Granulating 100% 10/08/20 1030  % Wound base Yellow/Fibrinous Exudate 0% 10/08/20 1030  % Wound base Black/Eschar 0% 10/08/20 1030  % Wound base Other/Granulation Tissue (Comment) 0% 10/08/20 1030  Peri-wound Assessment Induration proximal 1/2 10/08/20 1030  Wound Length (cm) 5.5 cm 10/04/20 1400  Wound Width (cm) 0.2 cm 10/04/20 1400  Wound Depth (cm) 3 cm 10/04/20 1400  Wound Volume (cm^3) 3.3 cm^3 10/04/20 1400  Wound Surface Area (cm^2) 1.1 cm^2 10/04/20 1400  Margins Unattached edges (unapproximated) 10/08/20 1030  Closure Sutures 10/08/20 1030  Drainage Amount Minimal 10/08/20 1030  Drainage Description Sanguineous 10/08/20 1030  Treatment Hydrotherapy (Pulse lavage);Packing (Impregnated strip) 10/08/20 1030   Hydrotherapy Pulsed lavage therapy - wound location: R forearm Pulsed Lavage with Suction (psi): 8 psi (8-12) Pulsed Lavage with Suction - Normal Saline Used: 1000 mL Pulsed Lavage Tip: Tip with splash shield   Wound  Assessment and Plan  Wound Therapy - Assess/Plan/Recommendations Wound Therapy - Clinical Statement: Some bloody drainage noted however appears to be slowing down. Noted induration around proximal 1/2 of wound both medial and lateral to incision. Due to sutures, it is difficult to visualize the wound but was assessed and packed as able. Will continue to follow for pulse lavage to decrease bioburden, promote wound bed healing, and for discharge education.  Wound Therapy - Functional Problem List: Decreased gross and fine motor of R wrist and hand Factors Delaying/Impairing Wound Healing: Infection - systemic/local;Substance abuse Hydrotherapy Plan: Debridement;Dressing change;Patient/family education;Pulsatile lavage with suction Wound Therapy - Frequency: 6X / week Wound Therapy - Follow Up Recommendations: Home health RN Wound Plan: See above  Wound Therapy Goals- Improve the function of patient's integumentary system by progressing the wound(s) through the phases of wound healing (inflammation - proliferation - remodeling) by: Improve Drainage Characteristics: Min;Serous Improve Drainage Characteristics - Progress: Progressing toward goal Patient/Family will be able to : demonstrate or instruct in proper dressing change technique with no cues from therapist.  Patient/Family Instruction Goal - Progress: Progressing toward goal Time For Goal Achievement: 7 days Wound Therapy - Potential for Goals: Good  Goals will be updated until maximal potential achieved or discharge criteria met.  Discharge criteria: when goals achieved, discharge from hospital, MD decision/surgical intervention, no progress towards goals, refusal/missing three consecutive treatments without notification or medical reason.  GP     Thelma Comp 10/08/2020, 10:35 AM  Rolinda Roan, PT, DPT Acute Rehabilitation Services Pager: [phone] Office: [phone]

## 2020-10-08 NOTE — Progress Notes (Signed)
Patient alert and oriented x 4 this morning with noticeable increase in COWS score. He is restless, voicing significant pain/discomfort, has cold chills, and is nauseous. Patient stating that he doesn't know why he is feeling this bad this far into his week of withdrawal, but he was visibly in distress. Contacted attending to notify him of this, and to discuss possibility of increasing daily methadone to 30mg  for about a week, then allow sufficient time to work on a tapering plan prior to patient's discharge, as he stated he does not want to go home on methadone because he cannot maintain, due to transportation issues to/from methadone clinics. Additionally, asked for a one time dose morphine to be ordered given prior to daily hydrotherapy. After physician visited with patient, new order received for increase in methadone, but not extra morphine; we will need to time hydrotherapy with PRN morphine doses. Patient was accepting of this. Additionally, PRN dose of zofran given for dry heaves with efficacy. Remaining of assessment revealed no abnormalities; patient is feeling the need to have a BM, but has not been strong enough to use the toilet yet. ID physician came by and changed antibiotics to unasyn; initial dose to be given today. No other needs at this time, will continue to monitor.

## 2020-10-08 NOTE — Progress Notes (Signed)
Subjective: Complaining of worsening wrist pain and worsening withdrawal symptoms  Antibiotics:  Anti-infectives (From admission, onward)   Start     Dose/Rate Route Frequency Ordered Stop   10/08/20 1400  Ampicillin-Sulbactam (UNASYN) 3 g in sodium chloride 0.9 % 100 mL IVPB        3 g 200 mL/hr over 30 Minutes Intravenous Every 6 hours 10/08/20 0924     10/07/20 1445  ceFAZolin (ANCEF) IVPB 2g/100 mL premix  Status:  Discontinued        2 g 200 mL/hr over 30 Minutes Intravenous Every 8 hours 10/07/20 1351 10/08/20 0924   10/06/20 1700  vancomycin (VANCOCIN) IVPB 1000 mg/200 mL premix  Status:  Discontinued        1,000 mg 200 mL/hr over 60 Minutes Intravenous Every 12 hours 10/06/20 1508 10/07/20 1352   10/06/20 1400  metroNIDAZOLE (FLAGYL) tablet 500 mg  Status:  Discontinued        500 mg Oral Every 8 hours 10/06/20 0922 10/08/20 0924   10/04/20 1100  vancomycin (VANCOREADY) IVPB 1500 mg/300 mL  Status:  Discontinued        1,500 mg 150 mL/hr over 120 Minutes Intravenous Every 24 hours 10/03/20 0932 10/06/20 0838   10/03/20 1200  vancomycin (VANCOCIN) IVPB 1000 mg/200 mL premix  Status:  Discontinued        1,000 mg 200 mL/hr over 60 Minutes Intravenous  Once 10/03/20 1147 10/03/20 1211   10/03/20 1200  ceFEPIme (MAXIPIME) 2 g in sodium chloride 0.9 % 100 mL IVPB  Status:  Discontinued        2 g 200 mL/hr over 30 Minutes Intravenous  Once 10/03/20 1147 10/03/20 1211   10/03/20 1000  ceFEPIme (MAXIPIME) 2 g in sodium chloride 0.9 % 100 mL IVPB  Status:  Discontinued        2 g 200 mL/hr over 30 Minutes Intravenous Every 8 hours 10/03/20 0932 10/07/20 1351   10/03/20 0945  vancomycin (VANCOREADY) IVPB 1750 mg/350 mL        1,750 mg 175 mL/hr over 120 Minutes Intravenous STAT 10/03/20 0932 10/03/20 1346   10/03/20 0500  vancomycin (VANCOREADY) IVPB 1750 mg/350 mL  Status:  Discontinued        1,750 mg 175 mL/hr over 120 Minutes Intravenous  Once 10/03/20 0449  10/03/20 0932   10/03/20 0445  cefTRIAXone (ROCEPHIN) 1 g in sodium chloride 0.9 % 100 mL IVPB        1 g 200 mL/hr over 30 Minutes Intravenous  Once 10/03/20 0444 10/03/20 0625      Medications: Scheduled Meds: . amLODipine  10 mg Oral Daily  . cloNIDine  0.1 mg Oral QAC breakfast  . gabapentin  600 mg Oral TID  . insulin aspart  0-5 Units Subcutaneous QHS  . insulin aspart  0-9 Units Subcutaneous TID WC  . insulin glargine  25 Units Subcutaneous Daily  . lisinopril  10 mg Oral Daily  . methadone  10 mg Oral Once  . [START ON 10/09/2020] methadone  30 mg Oral Daily  . traZODone  100 mg Oral QHS   Continuous Infusions: . sodium chloride 1,000 mL (10/07/20 0852)  . ampicillin-sulbactam (UNASYN) IV     PRN Meds:.sodium chloride, dicyclomine, hydrOXYzine, loperamide, methocarbamol, morphine injection, naproxen, ondansetron (ZOFRAN) IV, ondansetron    Objective: Weight change: -0.2 kg  Intake/Output Summary (Last 24 hours) at 10/08/2020 1239 Last data filed at 10/08/2020 0806 Gross per 24  hour  Intake 580 ml  Output 1050 ml  Net -470 ml   Blood pressure (!) 142/99, pulse 86, temperature 98.6 F (37 C), temperature source Oral, resp. rate 18, height 6' (1.829 m), weight 86 kg, SpO2 96 %. Temp:  [98 F (36.7 C)-98.8 F (37.1 C)] 98.6 F (37 C) (12/06 1112) Pulse Rate:  [86-92] 86 (12/06 1112) Resp:  [17-18] 18 (12/06 1112) BP: (137-145)/(91-99) 142/99 (12/06 1112) SpO2:  [93 %-97 %] 96 % (12/06 1112) Weight:  [86 kg] 86 kg (12/06 0545)  Physical Exam: General: Alert and awake, oriented x3, not in any acute distress. HEENT: anicteric sclera, EOMI CVS regular rate, normal  Chest: , no wheezing, no respiratory distress Abdomen: soft non-distended,  Extremities: Right forearm bandaged Skin: no rashes Neuro: nonfocal  CBC:    BMET Recent Labs    10/07/20 0146  NA 135  K 3.9  CL 101  CO2 24  GLUCOSE 195*  BUN 19  CREATININE 0.91  CALCIUM 9.7     Liver  Panel  No results for input(s): PROT, ALBUMIN, AST, ALT, ALKPHOS, BILITOT, BILIDIR, IBILI in the last 72 hours.     Sedimentation Rate No results for input(s): ESRSEDRATE in the last 72 hours. C-Reactive Protein No results for input(s): CRP in the last 72 hours.  Micro Results: Recent Results (from the past 720 hour(s))  Resp Panel by RT-PCR (Flu A&B, Covid) Nasopharyngeal Swab     Status: None   Collection Time: 10/03/20  5:12 AM   Specimen: Nasopharyngeal Swab; Nasopharyngeal(NP) swabs in vial transport medium  Result Value Ref Range Status   SARS Coronavirus 2 by RT PCR NEGATIVE NEGATIVE Final    Comment: (NOTE) SARS-CoV-2 target nucleic acids are NOT DETECTED.  The SARS-CoV-2 RNA is generally detectable in upper respiratory specimens during the acute phase of infection. The lowest concentration of SARS-CoV-2 viral copies this assay can detect is 138 copies/mL. A negative result does not preclude SARS-Cov-2 infection and should not be used as the sole basis for treatment or other patient management decisions. A negative result may occur with  improper specimen collection/handling, submission of specimen other than nasopharyngeal swab, presence of viral mutation(s) within the areas targeted by this assay, and inadequate number of viral copies(<138 copies/mL). A negative result must be combined with clinical observations, patient history, and epidemiological information. The expected result is Negative.  Fact Sheet for Patients:  BloggerCourse.com  Fact Sheet for Healthcare Providers:  SeriousBroker.it  This test is no t yet approved or cleared by the [place] FDA and  has been authorized for detection and/or diagnosis of SARS-CoV-2 by FDA under an Emergency Use Authorization (EUA). This EUA will remain  in effect (meaning this test can be used) for the duration of the COVID-19 declaration under Section 564(b)(1) of  the Act, 21 U.S.C.section 360bbb-3(b)(1), unless the authorization is terminated  or revoked sooner.       Influenza A by PCR NEGATIVE NEGATIVE Final   Influenza B by PCR NEGATIVE NEGATIVE Final    Comment: (NOTE) The Xpert Xpress SARS-CoV-2/FLU/RSV plus assay is intended as an aid in the diagnosis of influenza from Nasopharyngeal swab specimens and should not be used as a sole basis for treatment. Nasal washings and aspirates are unacceptable for Xpert Xpress SARS-CoV-2/FLU/RSV testing.  Fact Sheet for Patients: BloggerCourse.com  Fact Sheet for Healthcare Providers: SeriousBroker.it  This test is not yet approved or cleared by the [place] FDA and has been authorized for detection and/or diagnosis  of SARS-CoV-2 by FDA under an Emergency Use Authorization (EUA). This EUA will remain in effect (meaning this test can be used) for the duration of the COVID-19 declaration under Section 564(b)(1) of the Act, 21 U.S.C. section 360bbb-3(b)(1), unless the authorization is terminated or revoked.  Performed at [facility], [address] [place], [place] [address]   Aerobic/Anaerobic Culture (surgical/deep wound)     Status: None (Preliminary result)   Collection Time: 10/03/20  8:15 PM   Specimen: PATH Other; Tissue  Result Value Ref Range Status   Specimen Description WOUND  Final   Special Requests   Final    IRRIGATION AND DEBRIDEMENT RIGHT FOREARM ABCESS SPEC A   Gram Stain   Final    RARE WBC PRESENT, PREDOMINANTLY PMN FEW GRAM POSITIVE COCCI Performed at [facility], [address], [place], [place] [address]    Culture   Final    FEW STAPHYLOCOCCUS AUREUS NO ANAEROBES ISOLATED; CULTURE IN PROGRESS FOR 5 DAYS    Report Status PENDING  Incomplete   Organism ID, Bacteria STAPHYLOCOCCUS AUREUS  Final      Susceptibility   Staphylococcus aureus - MIC*    CIPROFLOXACIN <=0.5 SENSITIVE Sensitive      ERYTHROMYCIN <=0.25 SENSITIVE Sensitive     GENTAMICIN <=0.5 SENSITIVE Sensitive     OXACILLIN 0.5 SENSITIVE Sensitive     TETRACYCLINE <=1 SENSITIVE Sensitive     VANCOMYCIN 1 SENSITIVE Sensitive     TRIMETH/SULFA <=10 SENSITIVE Sensitive     CLINDAMYCIN <=0.25 SENSITIVE Sensitive     RIFAMPIN <=0.5 SENSITIVE Sensitive     Inducible Clindamycin NEGATIVE Sensitive     * FEW STAPHYLOCOCCUS AUREUS  Culture, blood (routine x 2)     Status: None (Preliminary result)   Collection Time: 10/04/20 11:06 AM   Specimen: BLOOD  Result Value Ref Range Status   Specimen Description BLOOD LEFT ANTECUBITAL  Final   Special Requests   Final    BOTTLES DRAWN AEROBIC AND ANAEROBIC Blood Culture adequate volume   Culture   Final    NO GROWTH 4 DAYS Performed at [facility], [address], [place], [place] [address]    Report Status PENDING  Incomplete  Culture, blood (routine x 2)     Status: None (Preliminary result)   Collection Time: 10/04/20 11:07 AM   Specimen: BLOOD  Result Value Ref Range Status   Specimen Description BLOOD LEFT ANTECUBITAL  Final   Special Requests   Final    BOTTLES DRAWN AEROBIC AND ANAEROBIC Blood Culture adequate volume   Culture   Final    NO GROWTH 4 DAYS Performed at [facility], [address], [place], [place] [address]    Report Status PENDING  Incomplete  MRSA PCR Screening     Status: None   Collection Time: 10/05/20  8:44 PM   Specimen: Nasal Mucosa; Nasopharyngeal  Result Value Ref Range Status   MRSA by PCR NEGATIVE NEGATIVE Final    Comment:        The GeneXpert MRSA Assay (FDA approved for NASAL specimens only), is one component of a comprehensive MRSA colonization surveillance program. It is not intended to diagnose MRSA infection nor to guide or monitor treatment for MRSA infections. Performed at [facility], [address], [place], [place] [address]     Studies/Results: [place] CHEST PORT 1 VIEW  Result Date:  10/07/2020 CLINICAL DATA:  Increased breath sounds EXAM: PORTABLE CHEST 1  VIEW COMPARISON:  02/14/2019 FINDINGS: The heart size and mediastinal contours are within normal limits. Both lungs are clear. The visualized skeletal structures are unremarkable. IMPRESSION: No active disease. Electronically Signed   By: Alcide CleverMark  Lukens M.D.   On: 10/07/2020 11:41      Assessment/Plan:  INTERVAL HISTORY: Patient had increasing problems with withdrawal symptoms   Principal Problem:   Right arm cellulitis Active Problems:   Opioid use disorder, severe, dependence (HCC)   Cirrhosis (HCC)   Pancytopenia (HCC)   Nicotine dependence   Essential hypertension   Diabetes mellitus (HCC)   Cellulitis    Chase Reed is a 54 y.o. male with IV drug use, now with large right forearm abscess status post I&D with MSSA isolated.  He has a history of chronic hepatitis C which was cured in the past.  1.  Severe MSSA soft tissue infection: We will change to Unasyn and if his he improves we will ultimately plan on discharging him on Augmentin  2.  IV drug use: Clearly needs a long-term opiate replacement plan.  He did not do well on Suboxone.  Sounds like he did better on methadone but was ultimately dismissed from 1 particular methadone clinic.  3.  History of hepatitis C: He is adamant that he is not sharing needles and that he should not be reinfected we will check a hepatitis C RNA   LOS: 4 days   Acey LavCornelius Van Dam 10/08/2020, 12:39 PM

## 2020-10-08 NOTE — Progress Notes (Signed)
PROGRESS NOTE    Chase Reed  TOI:[id number] DOB: 03/18/1966 DOA: 10/02/2020 PCP: Courtney Heys, PA-C    Brief Narrative:  54 year old gentleman with history of IV drug use, uses heroin injections, hepatitis C, pancytopenia, cirrhosis, type 2 diabetes on insulin and hypertension presented to the ER with the right arm swelling redness and pain for 3 days.  He injected on the right arm and he thinks he might have missed the vein.  Next morning he started noticing redness got progressively worse so came to the ER.  In the emergency room, afebrile.  Blood pressure 7 3/54 improved with IV fluid resuscitation.  Admitted and underwent surgical incision and drainage on 12/1.   Assessment & Plan:   Principal Problem:   Right arm cellulitis Active Problems:   Opioid use disorder, severe, dependence (HCC)   Cirrhosis (HCC)   Pancytopenia (HCC)   Nicotine dependence   Essential hypertension   Diabetes mellitus (HCC)   Cellulitis  Right arm cellulitis with abscess/necrotizing fasciitis in the setting of IV drug use: Incision and drainage 12/1 by orthopedics.  Currently on hydrotherapy.  Wound care as per surgery. Initiating broad-spectrum antibiotics, then on Ancef and changed to ampicillin today. Blood cultures negative, wound cultures with MSSA. Followed by ID, plan for Augmentin on discharge.  Opiate use disorder with dependence: Ongoing IV heroin use.  Patient was offered different treatment and he does not want to go on any treatment. He wants to use methadone, increase to 30 mg daily today, he wants to taper it off within a week. Declined Suboxone treatment, previous bad experience. Declines other resources, he thinks he can take care of it on his own. Will need more pain medication during hydrotherapy for the wound. Also on clonidine.  Type 2 diabetes, insulin-dependent with hyperglycemia: On long-acting insulin and short-acting insulin.  Blood sugars more than 200.  Change Lantus from 25 units to 30 units daily.  Continue sliding scale insulin.  Known history of cirrhosis from hepatitis C with thrombocytopenia: Fairly stable.  ID following.  Checking hep C levels.  Essential hypertension: Blood pressure stable.  On clonidine.  Off lisinopril.   DVT prophylaxis: SCDs Start: 10/03/20 1421   Code Status: Full code Family Communication: None Disposition Plan: Status is: Inpatient  Remains inpatient appropriate because:Inpatient level of care appropriate due to severity of illness   Dispo: The patient is from: Home              Anticipated d/c is to: Home              Anticipated d/c date is: 3 days              Patient currently is not medically stable to d/c.         Consultants:   Orthopedics  Infectious disease  Procedures:   I&D, 12/1  Antimicrobials:  Antibiotics Given (last 72 hours)    Date/Time Action Medication Dose Rate   10/05/20 1719 New Bag/Given   ceFEPIme (MAXIPIME) 2 g in sodium chloride 0.9 % 100 mL IVPB 2 g 200 mL/hr   10/06/20 0242 New Bag/Given   ceFEPIme (MAXIPIME) 2 g in sodium chloride 0.9 % 100 mL IVPB 2 g 200 mL/hr   10/06/20 1027 New Bag/Given   ceFEPIme (MAXIPIME) 2 g in sodium chloride 0.9 % 100 mL IVPB 2 g 200 mL/hr   10/06/20 1358 Given   metroNIDAZOLE (FLAGYL) tablet 500 mg 500 mg    10/06/20 1719 New Bag/Given  vancomycin (VANCOCIN) IVPB 1000 mg/200 mL premix 1,000 mg 200 mL/hr   10/06/20 1854 New Bag/Given   ceFEPIme (MAXIPIME) 2 g in sodium chloride 0.9 % 100 mL IVPB 2 g 200 mL/hr   10/06/20 2152 Given   metroNIDAZOLE (FLAGYL) tablet 500 mg 500 mg    10/07/20 0148 New Bag/Given   ceFEPIme (MAXIPIME) 2 g in sodium chloride 0.9 % 100 mL IVPB 2 g 200 mL/hr   10/07/20 0541 Given   metroNIDAZOLE (FLAGYL) tablet 500 mg 500 mg    10/07/20 0545 New Bag/Given   vancomycin (VANCOCIN) IVPB 1000 mg/200 mL premix 1,000 mg 200 mL/hr   10/07/20 0855 New Bag/Given   ceFEPIme (MAXIPIME) 2 g in sodium  chloride 0.9 % 100 mL IVPB 2 g 200 mL/hr   10/07/20 1413 Given   metroNIDAZOLE (FLAGYL) tablet 500 mg 500 mg    10/07/20 1457 New Bag/Given   ceFAZolin (ANCEF) IVPB 2g/100 mL premix 2 g 200 mL/hr   10/07/20 2233 New Bag/Given   ceFAZolin (ANCEF) IVPB 2g/100 mL premix 2 g 200 mL/hr   10/07/20 2237 Given   metroNIDAZOLE (FLAGYL) tablet 500 mg 500 mg    10/08/20 [id number] Given   metroNIDAZOLE (FLAGYL) tablet 500 mg 500 mg    10/08/20 0521 New Bag/Given   ceFAZolin (ANCEF) IVPB 2g/100 mL premix 2 g 200 mL/hr         Subjective: Patient seen and examined.  He was more concerned about having severe pain and unable to control pain on his arm.  He thinks he is going through withdrawals.  He thinks that next few days will be crucial and wants to use methadone and then ultimately stop.  He does not have a plan to continue to go to methadone clinic after discharge.  No other complaints.   Objective: Vitals:   10/07/20 1158 10/07/20 1946 10/08/20 0545 10/08/20 1112  BP: (!) 147/106 (!) 137/91 (!) 145/92 (!) 142/99  Pulse: (!) 105 87 92 86  Resp: 20 17 17 18   Temp: 98.4 F (36.9 C) 98.8 F (37.1 C) 98 F (36.7 C) 98.6 F (37 C)  TempSrc: Oral Oral Oral Oral  SpO2: 96% 97% 93% 96%  Weight:   86 kg   Height:        Intake/Output Summary (Last 24 hours) at 10/08/2020 1301 Last data filed at 10/08/2020 0806 Gross per 24 hour  Intake 580 ml  Output 1050 ml  Net -470 ml   Filed Weights   10/06/20 0440 10/07/20 0600 10/08/20 0545  Weight: 86 kg 86.2 kg 86 kg    Examination:  General exam: Appears calm and comfortable  Looks fairly comfortable. Respiratory system: Clear to auscultation. Respiratory effort normal. Cardiovascular system: S1 & S2 heard, RRR. No JVD, murmurs, rubs, gallops or clicks. No pedal edema. Gastrointestinal system: Abdomen is nondistended, soft and nontender. No organomegaly or masses felt. Normal bowel sounds heard. Central nervous system: Alert and oriented. No  focal neurological deficits. Extremities: Symmetric 5 x 5 power. Skin: No rashes, lesions or ulcers Right arm with just changed surgical dressing, not removed by me. Psychiatry: Judgement and insight appear normal. Mood & affect appropriate.     Data Reviewed: I have personally reviewed following labs and imaging studies  CBC: Recent Labs  Lab 10/02/20 2224 10/04/20 0612  WBC 4.5 7.4  NEUTROABS 3.4  --   HGB 12.6* 12.9*  HCT 36.8* 35.8*  MCV 89.3 85.4  PLT 31* 39*   Basic Metabolic Panel: Recent  Labs  Lab 10/02/20 2224 10/04/20 0612 10/07/20 0146  NA 135 140 135  K 4.7 4.0 3.9  CL 100 101 101  CO2 24 24 24   GLUCOSE 220* 198* 195*  BUN 16 10 19   CREATININE 1.14 0.94 0.91  CALCIUM 9.1 9.5 9.7   GFR: Estimated Creatinine Clearance: 104.2 mL/min (by C-G formula based on SCr of 0.91 mg/dL). Liver Function Tests: Recent Labs  Lab 10/02/20 2224  AST 62*  ALT 62*  ALKPHOS 110  BILITOT 0.9  PROT 7.5  ALBUMIN 3.8   No results for input(s): LIPASE, AMYLASE in the last 168 hours. No results for input(s): AMMONIA in the last 168 hours. Coagulation Profile: No results for input(s): INR, PROTIME in the last 168 hours. Cardiac Enzymes: No results for input(s): CKTOTAL, CKMB, CKMBINDEX, TROPONINI in the last 168 hours. BNP (last 3 results) No results for input(s): PROBNP in the last 8760 hours. HbA1C: No results for input(s): HGBA1C in the last 72 hours. CBG: Recent Labs  Lab 10/07/20 1154 10/07/20 1619 10/07/20 2132 10/08/20 0525 10/08/20 1114  GLUCAP 248* 233* 217* 215* 279*   Lipid Profile: No results for input(s): CHOL, HDL, LDLCALC, TRIG, CHOLHDL, LDLDIRECT in the last 72 hours. Thyroid Function Tests: No results for input(s): TSH, T4TOTAL, FREET4, T3FREE, THYROIDAB in the last 72 hours. Anemia Panel: No results for input(s): VITAMINB12, FOLATE, FERRITIN, TIBC, IRON, RETICCTPCT in the last 72 hours. Sepsis Labs: Recent Labs  Lab 10/02/20 2225   LATICACIDVEN 1.4    Recent Results (from the past 240 hour(s))  Resp Panel by RT-PCR (Flu A&B, Covid) Nasopharyngeal Swab     Status: None   Collection Time: 10/03/20  5:12 AM   Specimen: Nasopharyngeal Swab; Nasopharyngeal(NP) swabs in vial transport medium  Result Value Ref Range Status   SARS Coronavirus 2 by RT PCR NEGATIVE NEGATIVE Final    Comment: (NOTE) SARS-CoV-2 target nucleic acids are NOT DETECTED.  The SARS-CoV-2 RNA is generally detectable in upper respiratory specimens during the acute phase of infection. The lowest concentration of SARS-CoV-2 viral copies this assay can detect is 138 copies/mL. A negative result does not preclude SARS-Cov-2 infection and should not be used as the sole basis for treatment or other patient management decisions. A negative result may occur with  improper specimen collection/handling, submission of specimen other than nasopharyngeal swab, presence of viral mutation(s) within the areas targeted by this assay, and inadequate number of viral copies(<138 copies/mL). A negative result must be combined with clinical observations, patient history, and epidemiological information. The expected result is Negative.  Fact Sheet for Patients:  2226  Fact Sheet for Healthcare Providers:  14/01/21  This test is no t yet approved or cleared by the [employer] FDA and  has been authorized for detection and/or diagnosis of SARS-CoV-2 by FDA under an Emergency Use Authorization (EUA). This EUA will remain  in effect (meaning this test can be used) for the duration of the COVID-19 declaration under Section 564(b)(1) of the Act, 21 U.S.C.section 360bbb-3(b)(1), unless the authorization is terminated  or revoked sooner.       Influenza A by PCR NEGATIVE NEGATIVE Final   Influenza B by PCR NEGATIVE NEGATIVE Final    Comment: (NOTE) The Xpert Xpress SARS-CoV-2/FLU/RSV plus assay  is intended as an aid in the diagnosis of influenza from Nasopharyngeal swab specimens and should not be used as a sole basis for treatment. Nasal washings and aspirates are unacceptable for Xpert Xpress SARS-CoV-2/FLU/RSV testing.  Fact Sheet for  Patients: [employer]  Fact Sheet for Healthcare Providers: [employer]  This test is not yet approved or cleared by the [place] FDA and has been authorized for detection and/or diagnosis of SARS-CoV-2 by FDA under an Emergency Use Authorization (EUA). This EUA will remain in effect (meaning this test can be used) for the duration of the COVID-19 declaration under Section 564(b)(1) of the Act, 21 U.S.C. section 360bbb-3(b)(1), unless the authorization is terminated or revoked.  Performed at [facility], [address], [place], [place] [address]   Aerobic/Anaerobic Culture (surgical/deep wound)     Status: None   Collection Time: 10/03/20  8:15 PM   Specimen: PATH Other; Tissue  Result Value Ref Range Status   Specimen Description WOUND  Final   Special Requests   Final    IRRIGATION AND DEBRIDEMENT RIGHT FOREARM ABCESS SPEC A   Gram Stain   Final    RARE WBC PRESENT, PREDOMINANTLY PMN FEW GRAM POSITIVE COCCI    Culture   Final    FEW STAPHYLOCOCCUS AUREUS NO ANAEROBES ISOLATED Performed at [facility], [address] [place], [place] [address]    Report Status 10/08/2020 FINAL  Final   Organism ID, Bacteria STAPHYLOCOCCUS AUREUS  Final      Susceptibility   Staphylococcus aureus - MIC*    CIPROFLOXACIN <=0.5 SENSITIVE Sensitive     ERYTHROMYCIN <=0.25 SENSITIVE Sensitive     GENTAMICIN <=0.5 SENSITIVE Sensitive     OXACILLIN 0.5 SENSITIVE Sensitive     TETRACYCLINE <=1 SENSITIVE Sensitive     VANCOMYCIN 1 SENSITIVE Sensitive     TRIMETH/SULFA <=10 SENSITIVE Sensitive     CLINDAMYCIN <=0.25 SENSITIVE Sensitive     RIFAMPIN <=0.5 SENSITIVE  Sensitive     Inducible Clindamycin NEGATIVE Sensitive     * FEW STAPHYLOCOCCUS AUREUS  Culture, blood (routine x 2)     Status: None (Preliminary result)   Collection Time: 10/04/20 11:06 AM   Specimen: BLOOD  Result Value Ref Range Status   Specimen Description BLOOD LEFT ANTECUBITAL  Final   Special Requests   Final    BOTTLES DRAWN AEROBIC AND ANAEROBIC Blood Culture adequate volume   Culture   Final    NO GROWTH 4 DAYS Performed at [facility], [address], [place], [place] [address]    Report Status PENDING  Incomplete  Culture, blood (routine x 2)     Status: None (Preliminary result)   Collection Time: 10/04/20 11:07 AM   Specimen: BLOOD  Result Value Ref Range Status   Specimen Description BLOOD LEFT ANTECUBITAL  Final   Special Requests   Final    BOTTLES DRAWN AEROBIC AND ANAEROBIC Blood Culture adequate volume   Culture   Final    NO GROWTH 4 DAYS Performed at [facility], [address] [place], [place] [address]    Report Status PENDING  Incomplete  MRSA PCR Screening     Status: None   Collection Time: 10/05/20  8:44 PM   Specimen: Nasal Mucosa; Nasopharyngeal  Result Value Ref Range Status   MRSA by PCR NEGATIVE NEGATIVE Final    Comment:        The GeneXpert MRSA Assay (FDA approved for NASAL specimens only), is one component of a comprehensive MRSA colonization surveillance program. It is not intended to diagnose MRSA infection nor to guide or monitor treatment for MRSA infections. Performed at [facility], [address], [place], [place] [address]  Radiology Studies: DG CHEST PORT 1 VIEW  Result Date: 10/07/2020 CLINICAL DATA:  Increased breath sounds EXAM: PORTABLE CHEST 1 VIEW COMPARISON:  02/14/2019 FINDINGS: The heart size and mediastinal contours are within normal limits. Both lungs are clear. The visualized skeletal structures are unremarkable. IMPRESSION: No active disease. Electronically Signed   By:  Alcide Clever M.D.   On: 10/07/2020 11:41        Scheduled Meds: . amLODipine  10 mg Oral Daily  . cloNIDine  0.1 mg Oral QAC breakfast  . gabapentin  600 mg Oral TID  . insulin aspart  0-5 Units Subcutaneous QHS  . insulin aspart  0-9 Units Subcutaneous TID WC  . [START ON 10/09/2020] insulin glargine  30 Units Subcutaneous Daily  . lisinopril  10 mg Oral Daily  . methadone  10 mg Oral Once  . [START ON 10/09/2020] methadone  30 mg Oral Daily  . traZODone  100 mg Oral QHS   Continuous Infusions: . sodium chloride 1,000 mL (10/07/20 0852)  . ampicillin-sulbactam (UNASYN) IV       LOS: 4 days    Time spent: 30 minutes    Dorcas Carrow, MD [facility] Pager [phone]

## 2020-10-09 ENCOUNTER — Other Ambulatory Visit (HOSPITAL_COMMUNITY): Payer: Self-pay | Admitting: Internal Medicine

## 2020-10-09 DIAGNOSIS — A4901 Methicillin susceptible Staphylococcus aureus infection, unspecified site: Secondary | ICD-10-CM

## 2020-10-09 DIAGNOSIS — F17201 Nicotine dependence, unspecified, in remission: Secondary | ICD-10-CM | POA: Diagnosis not present

## 2020-10-09 DIAGNOSIS — F1120 Opioid dependence, uncomplicated: Secondary | ICD-10-CM | POA: Diagnosis not present

## 2020-10-09 DIAGNOSIS — L03113 Cellulitis of right upper limb: Secondary | ICD-10-CM | POA: Diagnosis not present

## 2020-10-09 LAB — CULTURE, BLOOD (ROUTINE X 2)
Culture: NO GROWTH
Culture: NO GROWTH
Special Requests: ADEQUATE
Special Requests: ADEQUATE

## 2020-10-09 LAB — GLUCOSE, CAPILLARY
Glucose-Capillary: 198 mg/dL — ABNORMAL HIGH (ref 70–99)
Glucose-Capillary: 221 mg/dL — ABNORMAL HIGH (ref 70–99)

## 2020-10-09 MED ORDER — AMOXICILLIN-POT CLAVULANATE 875-125 MG PO TABS
1.0000 | ORAL_TABLET | Freq: Two times a day (BID) | ORAL | Status: DC
  Administered 2020-10-09: 1 via ORAL
  Filled 2020-10-09: qty 1

## 2020-10-09 MED ORDER — AMOXICILLIN-POT CLAVULANATE 875-125 MG PO TABS: 1.0000 | ORAL_TABLET | Freq: Two times a day (BID) | ORAL | 0 refills | Status: AC

## 2020-10-09 MED ORDER — OXYCODONE HCL 5 MG PO TABS
5.0000 mg | ORAL_TABLET | Freq: Four times a day (QID) | ORAL | 0 refills | Status: AC | PRN
Start: 2020-10-09 — End: 2020-10-14

## 2020-10-09 MED FILL — AMOX-CLAV 875-125 MG TABLET: 875-125 | 15 days supply | Qty: 30 | Fill #0

## 2020-10-09 NOTE — Progress Notes (Signed)
CSW met with pt to discuss methadone and d/c plans. Pt is familiar with Methadone resources and was on methadone for 12 years. Pt states he does not want to be on methadone anymore and does not want to be linked with a clinic. Pt plans to taper off of methadone on his own. Pt is nervous of plan and states he may be at risk for returning to narcotic use. CSW and pt review other tx options and pt still wants to go with plan of tapering on his own. He says he is familiar with harm reduction services through [facility]. And is familiar with [facility] and [facility]. CSW also provides list of additional resources. Pt states he will obtain appointments on his own if needed.

## 2020-10-09 NOTE — Progress Notes (Signed)
Patient requiring home health wound care to help supervise appropriate wound care and provide appropriate supplies. Patient is aware that we are awaiting an agency to review his case and confirm if they are able to see him after discharge. Unfortunately, the patient is not willing to wait until we are able to confirm follow up care or make an appointment to see the surgeon. Patient states he cannot keep his mother waiting and that we have his contact information. This RN reiterated that it is important to make sure his wound is appropriately treated to optimize healing. Patient states he understands and that he is capable of wound care. I also reiterated if we are not able to confirm home health he will be responsible for buying additional wound care supplies. Patient continues to state he understands and that he will take care of things.

## 2020-10-09 NOTE — Progress Notes (Signed)
Pt stable at time of discharge. No signs of distress. Pt ambulated self downstairs, escorted by [facility] NT. Pt received AVS, however refused to listen to instructions and discharge teaching. No signs of distress noted. NO needs or concerns at this time.

## 2020-10-09 NOTE — Plan of Care (Signed)
  Problem: Activity: Goal: Risk for activity intolerance will decrease Outcome: Progressing   Problem: Coping: Goal: Level of anxiety will decrease Outcome: Progressing   

## 2020-10-09 NOTE — Discharge Summary (Signed)
Physician Discharge Summary  Chase Reed:[id number] DOB: October 19, 1966 DOA: 10/02/2020  PCP: Courtney Heys, PA-C  Admit date: 10/02/2020 Discharge date: 10/09/2020  Admitted From: Home Disposition: Home  Recommendations for Outpatient Follow-up:  1. Follow up with PCP in 1-2 weeks 2. Follow-up with infectious disease clinic as scheduled.  Home Health: Not applicable Equipment/Devices: Not needed  Discharge Condition: Stable CODE STATUS: Full code Diet recommendation: Low-salt, low-carb diet  Discharge summary: 54 year old gentleman with history of IV drug use, uses heroin injections, hepatitis C, pancytopenia, cirrhosis, type 2 diabetes on insulin and hypertension presented to the ER with the right arm swelling redness and pain for 3 days.  He injected on the right arm and he thinks he might have missed the vein.  Next morning he started noticing redness got progressively worse so came to the ER.  In the emergency room, afebrile.  Blood pressure 7 3/54 improved with IV fluid resuscitation.  Admitted and underwent surgical incision and drainage on 12/1.  #1 . right arm cellulitis with abscess/necrotizing fasciitis in the setting of IV drug use: Incision and drainage 12/1 by orthopedics.    Treated with hydrotherapy by physical therapy.   Adequate improvement of the wound.  Will need every day local dressing that he will do at home.   Initially treated on broad-spectrum antibiotics and then with ampicillin.   Blood cultures negative.  Wound culture with MSSA.   As per ID recommendation, discharged home with 15 more days of Augmentin.   He has a scheduled follow-up at ID clinic.   Patient is going to to dressing changes himself.    #2. Opiate use disorder with dependence: Ongoing IV heroin use.  Patient was offered different treatment and he does not want to go on any treatment. He was apparently started on methadone in the hospital which he does not plan to continue. He  does not want to do Suboxone. He was extensively counseled, given information about outpatient resources. Patient has hepatitis C and cirrhosis with mild liver function abnormalities, cannot use high doses of Tylenol. He has postoperative wound on his right arm that is very painful, after very careful discussion, extensive discussion about use of opiates, was prescribed a short course of oxycodone 5 mg, use 4 times daily with aim to taper in the next 3 to 4 days.  There was no other option available for his pain relief, knowing that he wants to quit using opiates, patient requested a short therapy until his incision is healing.  #3. Type 2 diabetes, insulin-dependent with hyperglycemia: On long-acting insulin and short-acting insulin.  Blood sugars better.  Go back on home dose medications.  #4. Known history of cirrhosis from hepatitis C with thrombocytopenia: Fairly stable.  ID following.  Checking hep C levels.  #5 . essential hypertension: Blood pressure stable.    Patient was using amlodipine and lisinopril at home.  He does not want to go back on medications.  If he wishes, he has enough prescriptions at home.   Patient is medically stable today.  Case discussed with infectious disease who prescribed oral antibiotics.  Careful discussion and very short course of oxycodone prescribed for postoperative pain as above.  He is well instructed and educated about dressing changes and he will do it at home.  Able to go home.   Discharge Diagnoses:  Principal Problem:   Right arm cellulitis Active Problems:   Opioid use disorder, severe, dependence (HCC)   Cirrhosis (HCC)   Pancytopenia (HCC)  Nicotine dependence   Essential hypertension   Diabetes mellitus (HCC)   Cellulitis    Discharge Instructions  Discharge Instructions    Call MD for:  redness, tenderness, or signs of infection (pain, swelling, redness, odor or green/yellow discharge around incision site)   Complete by: As  directed    Call MD for:  temperature >100.4   Complete by: As directed    Diet - low sodium heart healthy   Complete by: As directed    Diet Carb Modified   Complete by: As directed    Discharge instructions   Complete by: As directed    You have been prescribed a short course of opiate medications.  Be aware about the medication dependence and addictive potentials. Do not use more than 3 to 5 days, gradually taper off and stop.  You can take less than 2 g of Tylenol a day to help with the pain.   Discharge wound care:   Complete by: As directed    Daily dry dressing as instructed in the hospital.   Increase activity slowly   Complete by: As directed      Allergies as of 10/09/2020      Reactions   Atorvastatin Other (See Comments)   Leg cramps and body aches      Medication List    STOP taking these medications   aspirin 81 MG EC tablet   atomoxetine 80 MG capsule Commonly known as: STRATTERA   DULoxetine 60 MG capsule Commonly known as: CYMBALTA   prochlorperazine 10 MG tablet Commonly known as: COMPAZINE   risperiDONE 2 MG tablet Commonly known as: RISPERDAL     TAKE these medications   amoxicillin-clavulanate 875-125 MG tablet Commonly known as: AUGMENTIN Take 1 tablet by mouth 2 (two) times daily for 15 days.   gabapentin 600 MG tablet Commonly known as: NEURONTIN Take 600 mg by mouth in the morning, at noon, in the evening, and at bedtime.   insulin aspart 100 UNIT/ML injection Commonly known as: novoLOG Inject 2-8 Units into the skin See admin instructions. Sliding scale.over 250--2 units over 300--4 units over 350--6 units over 400--8 units Up to 50 units daily: For diabetes management What changed: additional instructions   insulin glargine 100 UNIT/ML injection Commonly known as: LANTUS Inject 30 Units into the skin daily.   Iron (Ferrous Sulfate) 325 (65 Fe) MG Tabs Take 325 mg by mouth daily.   oxyCODONE 5 MG immediate release  tablet Commonly known as: Roxicodone Take 1 tablet (5 mg total) by mouth every 6 (six) hours as needed for up to 5 days for moderate pain or severe pain.   traZODone 50 MG tablet Commonly known as: DESYREL Take 2 tablets (100 mg total) by mouth at bedtime as needed for sleep.            Discharge Care Instructions  (From admission, onward)         Start     Ordered   10/09/20 0000  Discharge wound care:       Comments: Daily dry dressing as instructed in the hospital.   10/09/20 1143          Follow-up Information    Courtney Heys, PA-C. Go on 10/30/2020.   Specialties: Family Medicine, Physician Assistant Why: @2 :40PM Contact information: [address] [place] [address]              Allergies  Allergen Reactions  . Atorvastatin Other (See  Comments)    Leg cramps and body aches    Consultations:  Orthopedics  Infectious disease   Procedures/Studies: DG Elbow Complete Right  Result Date: 10/03/2020 CLINICAL DATA:  Right elbow pain and redness related to heroin use EXAM: RIGHT ELBOW - COMPLETE 3+ VIEW COMPARISON:  None. FINDINGS: Subcutaneous reticulation and swollen appearance of the proximal forearm and medial to the elbow. No joint effusion, erosion, or malalignment. No soft tissue emphysema. IMPRESSION: Forearm swelling without soft tissue emphysema or elbow joint effusion. Electronically Signed   By: Marnee Spring M.D.   On: 10/03/2020 05:20   DG Wrist Complete Right  Result Date: 10/03/2020 CLINICAL DATA:  Wrist pain and redness relate to heroin use EXAM: RIGHT WRIST - COMPLETE 3+ VIEW COMPARISON:  None. FINDINGS: Subcutaneous reticulation in the mid to distal forearm few bubbles of gas clustered ventrally and laterally. No bony erosion. 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  Remote boxer's fracture. IMPRESSION: Forearm swelling with a cluster of gas bubbles in the ventral and lateral forearm. Electronically Signed    By: Marnee Spring M.D.   On: 10/03/2020 05:21   MR FOREARM RIGHT W WO CONTRAST  Result Date: 10/03/2020 CLINICAL DATA:  There 1 abuse, infection, erythema, swelling, pain EXAM: MRI OF THE RIGHT FOREARM WITHOUT AND WITH CONTRAST TECHNIQUE: Multiplanar, multisequence MR imaging of the right forearm was performed before and after the administration of intravenous contrast. CONTRAST:  37mL GADAVIST GADOBUTROL 1 MMOL/ML IV SOLN COMPARISON:  10/03/2020 FINDINGS: Bones/Joint/Cartilage No signal abnormalities to suggest trauma or infection. Joint spaces of the right elbow and right wrist appear well preserved. No joint effusion. Ligaments Intact Muscles and Tendons There is edema and mild enhancement within the superficial aspect of the brachioradialis muscle in the proximal forearm, likely related to the overlying inflammatory process. Remaining muscular structures are intact. Soft tissues There is a rim enhancing fluid collection within the proximal right forearm ventral radial aspect, measuring 3.1 x 1.6 cm in transverse direction and extending approximately 6.2 cm in length. Punctate low signal foci within the collection are compatible with gas seen on recent x-ray. Findings are consistent with abscess. The fluid collection is located approximately 1 cm deep to the skin surface. Edema seen in the subcutaneous fat surrounding the abscess. IMPRESSION: 1. Rim enhancing abscess with internal gas within the radial aspect of the proximal forearm, corresponding to the abnormality seen on recent x-ray. 2. Subcutaneous edema surrounding the abscess, as well as edema and enhancement of the adjacent underlying brachioradialis muscle. There is no evidence of intramuscular abscess or fluid collection at this time. Electronically Signed   By: Sharlet Salina M.D.   On: 10/03/2020 18:08   DG CHEST PORT 1 VIEW  Result Date: 10/07/2020 CLINICAL DATA:  Increased breath sounds EXAM: PORTABLE CHEST 1 VIEW COMPARISON:  02/14/2019  FINDINGS: The heart size and mediastinal contours are within normal limits. Both lungs are clear. The visualized skeletal structures are unremarkable. IMPRESSION: No active disease. Electronically Signed   By: Alcide Clever M.D.   On: 10/07/2020 11:41   (Echo, Carotid, EGD, Colonoscopy, ERCP)    Subjective: Patient seen and examined.  He just had some hydrotherapy done on his right arm and it hurts.  Otherwise no other overnight complaints.  He is ready to go home.   Discharge Exam: Vitals:   10/09/20 0621 10/09/20 1135  BP: (!) 155/98 (!) 118/107  Pulse: 86 92  Resp: 16 18  Temp: 98 F (36.7 C) 97.8 F (36.6 C)  SpO2:  97% 94%   Vitals:   10/08/20 1112 10/08/20 2020 10/09/20 0621 10/09/20 1135  BP: (!) 142/99 130/89 (!) 155/98 (!) 118/107  Pulse: 86 71 86 92  Resp: 18 17 16 18   Temp: 98.6 F (37 C) 98.4 F (36.9 C) 98 F (36.7 C) 97.8 F (36.6 C)  TempSrc: Oral Oral Oral Oral  SpO2: 96% 97% 97% 94%  Weight:      Height:        General: Pt is alert, awake, not in acute distress Not in any distress. Cardiovascular: RRR, S1/S2 +, no rubs, no gallops Respiratory: CTA bilaterally, no wheezing, no rhonchi Abdominal: Soft, NT, ND, bowel sounds + Extremities: no edema, no cyanosis Right arm postop dressing, incisions intact with some gaping, 2 retention sutures present. Distal neurovascular status intact.   The results of significant diagnostics from this hospitalization (including imaging, microbiology, ancillary and laboratory) are listed below for reference.     Microbiology: Recent Results (from the past 240 hour(s))  Resp Panel by RT-PCR (Flu A&B, Covid) Nasopharyngeal Swab     Status: None   Collection Time: 10/03/20  5:12 AM   Specimen: Nasopharyngeal Swab; Nasopharyngeal(NP) swabs in vial transport medium  Result Value Ref Range Status   SARS Coronavirus 2 by RT PCR NEGATIVE NEGATIVE Final    Comment: (NOTE) SARS-CoV-2 target nucleic acids are NOT  DETECTED.  The SARS-CoV-2 RNA is generally detectable in upper respiratory specimens during the acute phase of infection. The lowest concentration of SARS-CoV-2 viral copies this assay can detect is 138 copies/mL. A negative result does not preclude SARS-Cov-2 infection and should not be used as the sole basis for treatment or other patient management decisions. A negative result may occur with  improper specimen collection/handling, submission of specimen other than nasopharyngeal swab, presence of viral mutation(s) within the areas targeted by this assay, and inadequate number of viral copies(<138 copies/mL). A negative result must be combined with clinical observations, patient history, and epidemiological information. The expected result is Negative.  Fact Sheet for Patients:  BloggerCourse.com  Fact Sheet for Healthcare Providers:  SeriousBroker.it  This test is no t yet approved or cleared by the [place] FDA and  has been authorized for detection and/or diagnosis of SARS-CoV-2 by FDA under an Emergency Use Authorization (EUA). This EUA will remain  in effect (meaning this test can be used) for the duration of the COVID-19 declaration under Section 564(b)(1) of the Act, 21 U.S.C.section 360bbb-3(b)(1), unless the authorization is terminated  or revoked sooner.       Influenza A by PCR NEGATIVE NEGATIVE Final   Influenza B by PCR NEGATIVE NEGATIVE Final    Comment: (NOTE) The Xpert Xpress SARS-CoV-2/FLU/RSV plus assay is intended as an aid in the diagnosis of influenza from Nasopharyngeal swab specimens and should not be used as a sole basis for treatment. Nasal washings and aspirates are unacceptable for Xpert Xpress SARS-CoV-2/FLU/RSV testing.  Fact Sheet for Patients: BloggerCourse.com  Fact Sheet for Healthcare Providers: SeriousBroker.it  This test is not yet  approved or cleared by the [place] FDA and has been authorized for detection and/or diagnosis of SARS-CoV-2 by FDA under an Emergency Use Authorization (EUA). This EUA will remain in effect (meaning this test can be used) for the duration of the COVID-19 declaration under Section 564(b)(1) of the Act, 21 U.S.C. section 360bbb-3(b)(1), unless the authorization is terminated or revoked.  Performed at [facility], [address], [place], [place] [address]   Aerobic/Anaerobic  Culture (surgical/deep wound)     Status: None   Collection Time: 10/03/20  8:15 PM   Specimen: PATH Other; Tissue  Result Value Ref Range Status   Specimen Description WOUND  Final   Special Requests   Final    IRRIGATION AND DEBRIDEMENT RIGHT FOREARM ABCESS SPEC A   Gram Stain   Final    RARE WBC PRESENT, PREDOMINANTLY PMN FEW GRAM POSITIVE COCCI    Culture   Final    FEW STAPHYLOCOCCUS AUREUS NO ANAEROBES ISOLATED Performed at [facility], [address] [place], [place] [address]    Report Status 10/08/2020 FINAL  Final   Organism ID, Bacteria STAPHYLOCOCCUS AUREUS  Final      Susceptibility   Staphylococcus aureus - MIC*    CIPROFLOXACIN <=0.5 SENSITIVE Sensitive     ERYTHROMYCIN <=0.25 SENSITIVE Sensitive     GENTAMICIN <=0.5 SENSITIVE Sensitive     OXACILLIN 0.5 SENSITIVE Sensitive     TETRACYCLINE <=1 SENSITIVE Sensitive     VANCOMYCIN 1 SENSITIVE Sensitive     TRIMETH/SULFA <=10 SENSITIVE Sensitive     CLINDAMYCIN <=0.25 SENSITIVE Sensitive     RIFAMPIN <=0.5 SENSITIVE Sensitive     Inducible Clindamycin NEGATIVE Sensitive     * FEW STAPHYLOCOCCUS AUREUS  Culture, blood (routine x 2)     Status: None   Collection Time: 10/04/20 11:06 AM   Specimen: BLOOD  Result Value Ref Range Status   Specimen Description BLOOD LEFT ANTECUBITAL  Final   Special Requests   Final    BOTTLES DRAWN AEROBIC AND ANAEROBIC Blood Culture adequate volume   Culture   Final    NO GROWTH 5  DAYS Performed at [facility], [address] [place], [place] [address]    Report Status 10/09/2020 FINAL  Final  Culture, blood (routine x 2)     Status: None   Collection Time: 10/04/20 11:07 AM   Specimen: BLOOD  Result Value Ref Range Status   Specimen Description BLOOD LEFT ANTECUBITAL  Final   Special Requests   Final    BOTTLES DRAWN AEROBIC AND ANAEROBIC Blood Culture adequate volume   Culture   Final    NO GROWTH 5 DAYS Performed at [facility], [address] [place], [place] [address]    Report Status 10/09/2020 FINAL  Final  MRSA PCR Screening     Status: None   Collection Time: 10/05/20  8:44 PM   Specimen: Nasal Mucosa; Nasopharyngeal  Result Value Ref Range Status   MRSA by PCR NEGATIVE NEGATIVE Final    Comment:        The GeneXpert MRSA Assay (FDA approved for NASAL specimens only), is one component of a comprehensive MRSA colonization surveillance program. It is not intended to diagnose MRSA infection nor to guide or monitor treatment for MRSA infections. Performed at [facility], [address] [place], [place] [address]      Labs: BNP (last 3 results) No results for input(s): BNP in the last 8760 hours. Basic Metabolic Panel: Recent Labs  Lab 10/02/20 2224 10/04/20 0612 10/07/20 0146  NA 135 140 135  K 4.7 4.0 3.9  CL 100 101 101  CO2 24 24 24   GLUCOSE 220* 198* 195*  BUN 16 10 19   CREATININE 1.14 0.94 0.91  CALCIUM 9.1 9.5 9.7   Liver Function Tests: Recent Labs  Lab 10/02/20 2224  AST 62*  ALT 62*  ALKPHOS 110  BILITOT 0.9  PROT 7.5  ALBUMIN 3.8   No results for input(s): LIPASE, AMYLASE in the last 168 hours. No results for input(s): AMMONIA in the last 168 hours. CBC: Recent Labs  Lab 10/02/20 2224 10/04/20 0612  WBC 4.5 7.4  NEUTROABS 3.4  --   HGB 12.6* 12.9*  HCT 36.8* 35.8*  MCV 89.3 85.4  PLT 31* 39*   Cardiac Enzymes: No results for input(s): CKTOTAL, CKMB, CKMBINDEX, TROPONINI in the  last 168 hours. BNP: Invalid input(s): POCBNP CBG: Recent Labs  Lab 10/08/20 1114 10/08/20 1618 10/08/20 2027 10/09/20 0622 10/09/20 1137  GLUCAP 279* 221* 149* 198* 221*   D-Dimer No results for input(s): DDIMER in the last 72 hours. Hgb A1c No results for input(s): HGBA1C in the last 72 hours. Lipid Profile No results for input(s): CHOL, HDL, LDLCALC, TRIG, CHOLHDL, LDLDIRECT in the last 72 hours. Thyroid function studies No results for input(s): TSH, T4TOTAL, T3FREE, THYROIDAB in the last 72 hours.  Invalid input(s): FREET3 Anemia work up No results for input(s): VITAMINB12, FOLATE, FERRITIN, TIBC, IRON, RETICCTPCT in the last 72 hours. Urinalysis    Component Value Date/Time   COLORURINE YELLOW 10/02/2020 2214   APPEARANCEUR CLEAR 10/02/2020 2214   LABSPEC 1.021 10/02/2020 2214   PHURINE 5.0 10/02/2020 2214   GLUCOSEU 150 (A) 10/02/2020 2214   HGBUR NEGATIVE 10/02/2020 2214   BILIRUBINUR NEGATIVE 10/02/2020 2214   KETONESUR NEGATIVE 10/02/2020 2214   PROTEINUR NEGATIVE 10/02/2020 2214   NITRITE NEGATIVE 10/02/2020 2214   LEUKOCYTESUR NEGATIVE 10/02/2020 2214   Sepsis Labs Invalid input(s): PROCALCITONIN,  WBC,  LACTICIDVEN Microbiology Recent Results (from the past 240 hour(s))  Resp Panel by RT-PCR (Flu A&B, Covid) Nasopharyngeal Swab     Status: None   Collection Time: 10/03/20  5:12 AM   Specimen: Nasopharyngeal Swab; Nasopharyngeal(NP) swabs in vial transport medium  Result Value Ref Range Status   SARS Coronavirus 2 by RT PCR NEGATIVE NEGATIVE Final    Comment: (NOTE) SARS-CoV-2 target nucleic acids are NOT DETECTED.  The SARS-CoV-2 RNA is generally detectable in upper respiratory specimens during the acute phase of infection. The lowest concentration of SARS-CoV-2 viral copies this assay can detect is 138 copies/mL. A negative result does not preclude SARS-Cov-2 infection and should not be used as the sole basis for treatment or other patient  management decisions. A negative result may occur with  improper specimen collection/handling, submission of specimen other than nasopharyngeal swab, presence of viral mutation(s) within the areas targeted by this assay, and inadequate number of viral copies(<138 copies/mL). A negative result must be combined with clinical observations, patient history, and epidemiological information. The expected result is Negative.  Fact Sheet for Patients:  BloggerCourse.com  Fact Sheet for Healthcare Providers:  SeriousBroker.it  This test is no t yet approved or cleared by the [place] FDA and  has been authorized for detection and/or diagnosis of SARS-CoV-2 by FDA under an Emergency Use Authorization (EUA). This EUA will remain  in effect (meaning this test can be used) for the duration of the COVID-19 declaration under Section 564(b)(1) of the Act, 21 U.S.C.section 360bbb-3(b)(1), unless the authorization is terminated  or revoked sooner.       Influenza A by PCR NEGATIVE NEGATIVE Final   Influenza B by PCR NEGATIVE NEGATIVE Final    Comment: (NOTE) The Xpert Xpress SARS-CoV-2/FLU/RSV plus assay is intended as an aid in the diagnosis of influenza from Nasopharyngeal swab specimens and should not be used as a sole basis for treatment. Nasal  washings and aspirates are unacceptable for Xpert Xpress SARS-CoV-2/FLU/RSV testing.  Fact Sheet for Patients: BloggerCourse.com  Fact Sheet for Healthcare Providers: SeriousBroker.it  This test is not yet approved or cleared by the [place] FDA and has been authorized for detection and/or diagnosis of SARS-CoV-2 by FDA under an Emergency Use Authorization (EUA). This EUA will remain in effect (meaning this test can be used) for the duration of the COVID-19 declaration under Section 564(b)(1) of the Act, 21 U.S.C. section 360bbb-3(b)(1),  unless the authorization is terminated or revoked.  Performed at [facility], [address] [place], [place] [address]   Aerobic/Anaerobic Culture (surgical/deep wound)     Status: None   Collection Time: 10/03/20  8:15 PM   Specimen: PATH Other; Tissue  Result Value Ref Range Status   Specimen Description WOUND  Final   Special Requests   Final    IRRIGATION AND DEBRIDEMENT RIGHT FOREARM ABCESS SPEC A   Gram Stain   Final    RARE WBC PRESENT, PREDOMINANTLY PMN FEW GRAM POSITIVE COCCI    Culture   Final    FEW STAPHYLOCOCCUS AUREUS NO ANAEROBES ISOLATED Performed at [facility], [address], [place], [place] [address]    Report Status 10/08/2020 FINAL  Final   Organism ID, Bacteria STAPHYLOCOCCUS AUREUS  Final      Susceptibility   Staphylococcus aureus - MIC*    CIPROFLOXACIN <=0.5 SENSITIVE Sensitive     ERYTHROMYCIN <=0.25 SENSITIVE Sensitive     GENTAMICIN <=0.5 SENSITIVE Sensitive     OXACILLIN 0.5 SENSITIVE Sensitive     TETRACYCLINE <=1 SENSITIVE Sensitive     VANCOMYCIN 1 SENSITIVE Sensitive     TRIMETH/SULFA <=10 SENSITIVE Sensitive     CLINDAMYCIN <=0.25 SENSITIVE Sensitive     RIFAMPIN <=0.5 SENSITIVE Sensitive     Inducible Clindamycin NEGATIVE Sensitive     * FEW STAPHYLOCOCCUS AUREUS  Culture, blood (routine x 2)     Status: None   Collection Time: 10/04/20 11:06 AM   Specimen: BLOOD  Result Value Ref Range Status   Specimen Description BLOOD LEFT ANTECUBITAL  Final   Special Requests   Final    BOTTLES DRAWN AEROBIC AND ANAEROBIC Blood Culture adequate volume   Culture   Final    NO GROWTH 5 DAYS Performed at [facility], [address], [place], [place] [address]    Report Status 10/09/2020 FINAL  Final  Culture, blood (routine x 2)     Status: None   Collection Time: 10/04/20 11:07 AM   Specimen: BLOOD  Result Value Ref Range Status   Specimen Description BLOOD LEFT ANTECUBITAL  Final   Special Requests   Final     BOTTLES DRAWN AEROBIC AND ANAEROBIC Blood Culture adequate volume   Culture   Final    NO GROWTH 5 DAYS Performed at [facility], [address], [place], [place] [address]    Report Status 10/09/2020 FINAL  Final  MRSA PCR Screening     Status: None   Collection Time: 10/05/20  8:44 PM   Specimen: Nasal Mucosa; Nasopharyngeal  Result Value Ref Range Status   MRSA by PCR NEGATIVE NEGATIVE Final    Comment:        The GeneXpert MRSA Assay (FDA approved for NASAL specimens only), is one component of a comprehensive MRSA colonization surveillance program. It is not intended to diagnose MRSA infection nor to guide or monitor treatment for MRSA infections. Performed  at [facility], [address], [place], [place] [address]      Time coordinating discharge: 40 minutes  SIGNED:   Dorcas Carrow, MD  [facility] 10/09/2020, 1:22 PM

## 2020-10-09 NOTE — Progress Notes (Signed)
Physical Therapy Wound Treatment and Discharge Patient Details  Name: DORIEN MAYOTTE MRN: [id number] Date of Birth: 1966-02-07  Today's Date: 10/09/2020 Time: 1030-1050 Time Calculation (min): 20 min  Subjective  Subjective: Excited at the prospect of discharging today.  Patient and Family Stated Goals: Heal wound, go home Date of Onset: 10/01/20 Prior Treatments: I&D 10/03/2020  Pain Score: Pt premedicated and tolerated treatment well without complaints of increased pain.    Wound Assessment  Negative Pressure Wound Therapy Chest Anterior;Mid (Active)     Wound / Incision (Open or Dehisced) 10/04/20 Incision - Open Arm Right Forearm (Active)  Dressing Type Compression wrap;Gauze (Comment);Moist to dry 10/09/20 1414  Dressing Changed Changed 10/09/20 1414  Dressing Status Clean;Dry;Intact 10/09/20 1414  Dressing Change Frequency Daily 10/09/20 1414  Site / Wound Assessment Clean 10/09/20 1414  % Wound base Red or Granulating 100% 10/09/20 1414  % Wound base Yellow/Fibrinous Exudate 0% 10/09/20 1414  % Wound base Black/Eschar 0% 10/09/20 1414  % Wound base Other/Granulation Tissue (Comment) 0% 10/09/20 1414  Peri-wound Assessment Intact;Induration 10/09/20 1414  Wound Length (cm) 5.5 cm 10/04/20 1400  Wound Width (cm) 0.2 cm 10/04/20 1400  Wound Depth (cm) 3 cm 10/04/20 1400  Wound Volume (cm^3) 3.3 cm^3 10/04/20 1400  Wound Surface Area (cm^2) 1.1 cm^2 10/04/20 1400  Margins Unattached edges (unapproximated) 10/09/20 1414  Closure Sutures 10/09/20 1414  Drainage Amount Minimal 10/09/20 1414  Drainage Description Sanguineous 10/09/20 1414  Treatment Hydrotherapy (Pulse lavage);Packing (Impregnated strip) 10/09/20 1414   Hydrotherapy Pulsed lavage therapy - wound location: R forearm Pulsed Lavage with Suction (psi): 12 psi Pulsed Lavage with Suction - Normal Saline Used: 1000 mL Pulsed Lavage Tip: Tip with splash shield   Wound Assessment and Plan  Wound Therapy -  Assess/Plan/Recommendations Wound Therapy - Clinical Statement: Induration appeared to be improved today however still present. Focus of session was discharge education regarding dressing change technique. Pt was able to demonstrate self-dressing change with assist for set-up only. Will d/c from hydrotherapy at this time as the wound is clean and education has been completed. If needs change, please reconsult.  Wound Therapy - Functional Problem List: Decreased gross and fine motor of R wrist and hand Factors Delaying/Impairing Wound Healing: Infection - systemic/local;Substance abuse Hydrotherapy Plan: Debridement;Dressing change;Patient/family education;Pulsatile lavage with suction Wound Therapy - Frequency: 6X / week Wound Therapy - Follow Up Recommendations: Home health RN Wound Plan: See above  Wound Therapy Goals- Improve the function of patient's integumentary system by progressing the wound(s) through the phases of wound healing (inflammation - proliferation - remodeling) by: Improve Drainage Characteristics: Min;Serous Improve Drainage Characteristics - Progress: Progressing toward goal Patient/Family will be able to : demonstrate or instruct in proper dressing change technique with no cues from therapist.  Patient/Family Instruction Goal - Progress: Met Time For Goal Achievement: 7 days Wound Therapy - Potential for Goals: Good  Goals will be updated until maximal potential achieved or discharge criteria met.  Discharge criteria: when goals achieved, discharge from hospital, MD decision/surgical intervention, no progress towards goals, refusal/missing three consecutive treatments without notification or medical reason.  GP     Thelma Comp 10/09/2020, 2:24 PM   Rolinda Roan, PT, DPT Acute Rehabilitation Services Pager: [phone] Office: [phone]

## 2020-10-09 NOTE — Plan of Care (Signed)
°  Problem: Education: °Goal: Knowledge of General Education information will improve °Description: Including pain rating scale, medication(s)/side effects and non-pharmacologic comfort measures °Outcome: Adequate for Discharge °  °Problem: Health Behavior/Discharge Planning: °Goal: Ability to manage health-related needs will improve °Outcome: Adequate for Discharge °  °Problem: Clinical Measurements: °Goal: Ability to maintain clinical measurements within normal limits will improve °Outcome: Adequate for Discharge °Goal: Diagnostic test results will improve °Outcome: Adequate for Discharge °Goal: Respiratory complications will improve °Outcome: Adequate for Discharge °Goal: Cardiovascular complication will be avoided °Outcome: Adequate for Discharge °  °Problem: Activity: °Goal: Risk for activity intolerance will decrease °Outcome: Adequate for Discharge °  °Problem: Nutrition: °Goal: Adequate nutrition will be maintained °Outcome: Adequate for Discharge °  °Problem: Coping: °Goal: Level of anxiety will decrease °Outcome: Adequate for Discharge °  °Problem: Elimination: °Goal: Will not experience complications related to bowel motility °Outcome: Adequate for Discharge °Goal: Will not experience complications related to urinary retention °Outcome: Adequate for Discharge °  °Problem: Pain Managment: °Goal: General experience of comfort will improve °Outcome: Adequate for Discharge °  °Problem: Safety: °Goal: Ability to remain free from injury will improve °Outcome: Adequate for Discharge °  °Problem: Skin Integrity: °Goal: Risk for impaired skin integrity will decrease °Outcome: Adequate for Discharge °  °

## 2020-10-09 NOTE — Progress Notes (Signed)
Subjective: Pain is better but still going through withdrawal he dislikes having IV beep Antibiotics:  Anti-infectives (From admission, onward)   Start     Dose/Rate Route Frequency Ordered Stop   10/09/20 1200  amoxicillin-clavulanate (AUGMENTIN) 875-125 MG per tablet 1 tablet        1 tablet Oral Every 12 hours 10/09/20 0934     10/08/20 1400  Ampicillin-Sulbactam (UNASYN) 3 g in sodium chloride 0.9 % 100 mL IVPB  Status:  Discontinued        3 g 200 mL/hr over 30 Minutes Intravenous Every 6 hours 10/08/20 0924 10/09/20 0934   10/07/20 1445  ceFAZolin (ANCEF) IVPB 2g/100 mL premix  Status:  Discontinued        2 g 200 mL/hr over 30 Minutes Intravenous Every 8 hours 10/07/20 1351 10/08/20 0924   10/06/20 1700  vancomycin (VANCOCIN) IVPB 1000 mg/200 mL premix  Status:  Discontinued        1,000 mg 200 mL/hr over 60 Minutes Intravenous Every 12 hours 10/06/20 1508 10/07/20 1352   10/06/20 1400  metroNIDAZOLE (FLAGYL) tablet 500 mg  Status:  Discontinued        500 mg Oral Every 8 hours 10/06/20 0922 10/08/20 0924   10/04/20 1100  vancomycin (VANCOREADY) IVPB 1500 mg/300 mL  Status:  Discontinued        1,500 mg 150 mL/hr over 120 Minutes Intravenous Every 24 hours 10/03/20 0932 10/06/20 0838   10/03/20 1200  vancomycin (VANCOCIN) IVPB 1000 mg/200 mL premix  Status:  Discontinued        1,000 mg 200 mL/hr over 60 Minutes Intravenous  Once 10/03/20 1147 10/03/20 1211   10/03/20 1200  ceFEPIme (MAXIPIME) 2 g in sodium chloride 0.9 % 100 mL IVPB  Status:  Discontinued        2 g 200 mL/hr over 30 Minutes Intravenous  Once 10/03/20 1147 10/03/20 1211   10/03/20 1000  ceFEPIme (MAXIPIME) 2 g in sodium chloride 0.9 % 100 mL IVPB  Status:  Discontinued        2 g 200 mL/hr over 30 Minutes Intravenous Every 8 hours 10/03/20 0932 10/07/20 1351   10/03/20 0945  vancomycin (VANCOREADY) IVPB 1750 mg/350 mL        1,750 mg 175 mL/hr over 120 Minutes Intravenous STAT 10/03/20 0932  10/03/20 1346   10/03/20 0500  vancomycin (VANCOREADY) IVPB 1750 mg/350 mL  Status:  Discontinued        1,750 mg 175 mL/hr over 120 Minutes Intravenous  Once 10/03/20 0449 10/03/20 0932   10/03/20 0445  cefTRIAXone (ROCEPHIN) 1 g in sodium chloride 0.9 % 100 mL IVPB        1 g 200 mL/hr over 30 Minutes Intravenous  Once 10/03/20 0444 10/03/20 0625      Medications: Scheduled Meds: . amLODipine  10 mg Oral Daily  . amoxicillin-clavulanate  1 tablet Oral Q12H  . gabapentin  600 mg Oral TID  . insulin aspart  0-5 Units Subcutaneous QHS  . insulin aspart  0-9 Units Subcutaneous TID WC  . insulin glargine  30 Units Subcutaneous Daily  . lisinopril  10 mg Oral Daily  . methadone  30 mg Oral Daily  . traZODone  100 mg Oral QHS   Continuous Infusions: . sodium chloride 1,000 mL (10/07/20 0852)   PRN Meds:.sodium chloride, methocarbamol, morphine injection, ondansetron (ZOFRAN) IV    Objective: Weight change:   Intake/Output Summary (Last 24 hours)  at 10/09/2020 1042 Last data filed at 10/09/2020 0000 Gross per 24 hour  Intake 1205.75 ml  Output 350 ml  Net 855.75 ml   Blood pressure (!) 155/98, pulse 86, temperature 98 F (36.7 C), temperature source Oral, resp. rate 16, height 6' (1.829 m), weight 86 kg, SpO2 97 %. Temp:  [98 F (36.7 C)-98.6 F (37 C)] 98 F (36.7 C) (12/07 0621) Pulse Rate:  [71-86] 86 (12/07 0621) Resp:  [16-18] 16 (12/07 0621) BP: (130-155)/(89-99) 155/98 (12/07 0621) SpO2:  [96 %-97 %] 97 % (12/07 6734)  Physical Exam: General: Alert and awake, oriented x3, not in any acute distress. HEENT: anicteric sclera, EOMI CVS regular rate, normal  Chest: , no wheezing, no respiratory distress Abdomen: soft non-distended,  Extremities: Right forearm bandaged Skin: no rashes Neuro: nonfocal  CBC:    BMET Recent Labs    10/07/20 0146  NA 135  K 3.9  CL 101  CO2 24  GLUCOSE 195*  BUN 19  CREATININE 0.91  CALCIUM 9.7     Liver Panel  No  results for input(s): PROT, ALBUMIN, AST, ALT, ALKPHOS, BILITOT, BILIDIR, IBILI in the last 72 hours.     Sedimentation Rate No results for input(s): ESRSEDRATE in the last 72 hours. C-Reactive Protein No results for input(s): CRP in the last 72 hours.  Micro Results: Recent Results (from the past 720 hour(s))  Resp Panel by RT-PCR (Flu A&B, Covid) Nasopharyngeal Swab     Status: None   Collection Time: 10/03/20  5:12 AM   Specimen: Nasopharyngeal Swab; Nasopharyngeal(NP) swabs in vial transport medium  Result Value Ref Range Status   SARS Coronavirus 2 by RT PCR NEGATIVE NEGATIVE Final    Comment: (NOTE) SARS-CoV-2 target nucleic acids are NOT DETECTED.  The SARS-CoV-2 RNA is generally detectable in upper respiratory specimens during the acute phase of infection. The lowest concentration of SARS-CoV-2 viral copies this assay can detect is 138 copies/mL. A negative result does not preclude SARS-Cov-2 infection and should not be used as the sole basis for treatment or other patient management decisions. A negative result may occur with  improper specimen collection/handling, submission of specimen other than nasopharyngeal swab, presence of viral mutation(s) within the areas targeted by this assay, and inadequate number of viral copies(<138 copies/mL). A negative result must be combined with clinical observations, patient history, and epidemiological information. The expected result is Negative.  Fact Sheet for Patients:  BloggerCourse.com  Fact Sheet for Healthcare Providers:  SeriousBroker.it  This test is no t yet approved or cleared by the [place] FDA and  has been authorized for detection and/or diagnosis of SARS-CoV-2 by FDA under an Emergency Use Authorization (EUA). This EUA will remain  in effect (meaning this test can be used) for the duration of the COVID-19 declaration under Section 564(b)(1) of the Act,  21 U.S.C.section 360bbb-3(b)(1), unless the authorization is terminated  or revoked sooner.       Influenza A by PCR NEGATIVE NEGATIVE Final   Influenza B by PCR NEGATIVE NEGATIVE Final    Comment: (NOTE) The Xpert Xpress SARS-CoV-2/FLU/RSV plus assay is intended as an aid in the diagnosis of influenza from Nasopharyngeal swab specimens and should not be used as a sole basis for treatment. Nasal washings and aspirates are unacceptable for Xpert Xpress SARS-CoV-2/FLU/RSV testing.  Fact Sheet for Patients: BloggerCourse.com  Fact Sheet for Healthcare Providers: SeriousBroker.it  This test is not yet approved or cleared by the [place] and has been authorized  for detection and/or diagnosis of SARS-CoV-2 by FDA under an Emergency Use Authorization (EUA). This EUA will remain in effect (meaning this test can be used) for the duration of the COVID-19 declaration under Section 564(b)(1) of the Act, 21 U.S.C. section 360bbb-3(b)(1), unless the authorization is terminated or revoked.  Performed at [facility], [address], [place], [place] [address]   Aerobic/Anaerobic Culture (surgical/deep wound)     Status: None   Collection Time: 10/03/20  8:15 PM   Specimen: PATH Other; Tissue  Result Value Ref Range Status   Specimen Description WOUND  Final   Special Requests   Final    IRRIGATION AND DEBRIDEMENT RIGHT FOREARM ABCESS SPEC A   Gram Stain   Final    RARE WBC PRESENT, PREDOMINANTLY PMN FEW GRAM POSITIVE COCCI    Culture   Final    FEW STAPHYLOCOCCUS AUREUS NO ANAEROBES ISOLATED Performed at [facility], [address] [place], [place] [address]    Report Status 10/08/2020 FINAL  Final   Organism ID, Bacteria STAPHYLOCOCCUS AUREUS  Final      Susceptibility   Staphylococcus aureus - MIC*    CIPROFLOXACIN <=0.5 SENSITIVE Sensitive     ERYTHROMYCIN <=0.25 SENSITIVE Sensitive     GENTAMICIN <=0.5  SENSITIVE Sensitive     OXACILLIN 0.5 SENSITIVE Sensitive     TETRACYCLINE <=1 SENSITIVE Sensitive     VANCOMYCIN 1 SENSITIVE Sensitive     TRIMETH/SULFA <=10 SENSITIVE Sensitive     CLINDAMYCIN <=0.25 SENSITIVE Sensitive     RIFAMPIN <=0.5 SENSITIVE Sensitive     Inducible Clindamycin NEGATIVE Sensitive     * FEW STAPHYLOCOCCUS AUREUS  Culture, blood (routine x 2)     Status: None   Collection Time: 10/04/20 11:06 AM   Specimen: BLOOD  Result Value Ref Range Status   Specimen Description BLOOD LEFT ANTECUBITAL  Final   Special Requests   Final    BOTTLES DRAWN AEROBIC AND ANAEROBIC Blood Culture adequate volume   Culture   Final    NO GROWTH 5 DAYS Performed at [facility], [address], [place], [place] [address]    Report Status 10/09/2020 FINAL  Final  Culture, blood (routine x 2)     Status: None   Collection Time: 10/04/20 11:07 AM   Specimen: BLOOD  Result Value Ref Range Status   Specimen Description BLOOD LEFT ANTECUBITAL  Final   Special Requests   Final    BOTTLES DRAWN AEROBIC AND ANAEROBIC Blood Culture adequate volume   Culture   Final    NO GROWTH 5 DAYS Performed at [facility], [address], [place], [place] [address]    Report Status 10/09/2020 FINAL  Final  MRSA PCR Screening     Status: None   Collection Time: 10/05/20  8:44 PM   Specimen: Nasal Mucosa; Nasopharyngeal  Result Value Ref Range Status   MRSA by PCR NEGATIVE NEGATIVE Final    Comment:        The GeneXpert MRSA Assay (FDA approved for NASAL specimens only), is one component of a comprehensive MRSA colonization surveillance program. It is not intended to diagnose MRSA infection nor to guide or monitor treatment for MRSA infections. Performed at [facility], [address] [place], [place] [address]     Studies/Results: [place] CHEST PORT 1 VIEW  Result Date: 10/07/2020 CLINICAL DATA:  Increased breath sounds EXAM: PORTABLE CHEST 1 VIEW COMPARISON:  02/14/2019  FINDINGS:  The heart size and mediastinal contours are within normal limits. Both lungs are clear. The visualized skeletal structures are unremarkable. IMPRESSION: No active disease. Electronically Signed   By: Alcide Clever M.D.   On: 10/07/2020 11:41      Assessment/Plan:  INTERVAL HISTORY:   Patient's pain better  Principal Problem:   Right arm cellulitis Active Problems:   Opioid use disorder, severe, dependence (HCC)   Cirrhosis (HCC)   Pancytopenia (HCC)   Nicotine dependence   Essential hypertension   Diabetes mellitus (HCC)   Cellulitis    Chase Reed is a 54 y.o. male with IV drug use, now with large right forearm abscess status post I&D with MSSA isolated.  He has a history of chronic hepatitis C which was cured in the past.  1.  Severe MSSA soft tissue infection: We will go ahead and change him to Augmentin and he can complete 3 weeks of postoperative antibiotics  2.  IV drug use: Clearly needs a long-term opiate replacement plan.  He did not do well on Suboxone.  Sounds like he did better on methadone but was ultimately dismissed from 1 particular methadone clinic.  3.  History of hepatitis C: He is adamant that he is not sharing needles and that he should not be reinfected we will check a hepatitis C RNA    TALAN GILDNER has an appointment on 09/19/2021 at 1145 AM with Dr. Luciana Axe  The [facility] for Infectious Disease is located in the [facility] at  [address] [place] in [place].  [address], which is located to the left of the elevators.  Phone: [phone]  Fax: [phone]  [email or web address]  He by 15 minutes prior to his appointment.  We will sign off for now please call with further questions.    LOS: 5 days   Acey Lav 10/09/2020, 10:42 AM

## 2020-10-09 NOTE — TOC Transition Note (Addendum)
Transition of Care [facility]) - CM/SW Discharge Note   Patient Details  Name: Chase Reed MRN: [id number] Date of Birth: June 10, 1966  [facility] CM/SW Contact:  Leone Haven, RN Phone Number: 10/09/2020, 1:58 PM   Clinical Narrative:    NCM spoke with patient, offered choice, he does not have a preference,  NCM made referral to [place] with [facility] for [facility] to come check on wound and go give supplies.  Patient knows how to do his wound care with packing.  He will go home with a week supplies of dressing changes.  Awaiting to hear back from [place] with [facility].  Per [place] they decline this referral.  [facility] declined, [facility] declined. Patient started to leaved, Staff RN had to catch him to give him the dressing changes. She informed him that he will have to buy supplies him self , he states he will purchase the supplies.  NCM could not get an agency for this,  NCM contacted the wound clinic at [facility] to see if they could see patient once a week to make sure wound is not getting infected, she states she has him down for 1/10 at 9:00, but doubt he has to wait that long to be seen because they have cancellations everyday.  She will be calling patient regarding his apt at the wound clinic. NCM informed patient of this information , Patient phone is [phone], this is a lanline.  NCM informed MD of this information also.  Final next level of care: Home w Home Health Services Barriers to Discharge: No Barriers Identified   Patient Goals and CMS Choice Patient states their goals for this hospitalization and ongoing recovery are:: get better CMS Medicare.gov Compare Post Acute Care list provided to:: Patient Choice offered to / list presented to : Patient  Discharge Placement                       Discharge Plan and Services                  DME Agency: NA       HH Arranged: RN [facility] Agency: [facility] Date [facility] Agency Contacted:  10/09/20 Time HH Agency Contacted: 1358 Representative spoke with at [facility] Agency: [place]  Social Determinants of Health (SDOH) Interventions     Readmission Risk Interventions Readmission Risk Prevention Plan 01/25/2019  Transportation Screening Complete  PCP or Specialist Appt within 3-5 Days Complete  HRI or Home Care Consult Complete  Palliative Care Screening Not Applicable  Medication Review (RN Care Manager) Complete  Some recent data might be hidden

## 2020-10-10 LAB — HEPATITIS B SURFACE ANTIBODY, QUANTITATIVE: Hep B S AB Quant (Post): <3.1 m[IU]/mL — ABNORMAL LOW (ref >9.9)

## 2020-10-12 LAB — HEPATITIS C GENOTYPE: Hepatitis C Genotype: 3

## 2020-10-12 LAB — HCV RNA QUANT RFLX ULTRA OR GENOTYP
HCV RNA Qnt(log copy/mL): 4.86 log10 IU/mL
HepC Qn: 72400 IU/mL

## 2020-10-29 ENCOUNTER — Other Ambulatory Visit: Payer: Medicaid Other

## 2020-10-29 ENCOUNTER — Ambulatory Visit: Payer: Medicaid Other | Admitting: Hematology and Oncology

## 2020-11-05 ENCOUNTER — Other Ambulatory Visit (HOSPITAL_COMMUNITY): Payer: Self-pay | Admitting: Psychiatry

## 2020-11-05 DIAGNOSIS — F333 Major depressive disorder, recurrent, severe with psychotic symptoms: Secondary | ICD-10-CM

## 2020-11-07 ENCOUNTER — Inpatient Hospital Stay: Payer: Medicaid Other | Admitting: Hematology and Oncology

## 2020-11-07 ENCOUNTER — Other Ambulatory Visit: Payer: Self-pay | Admitting: Hematology and Oncology

## 2020-11-07 ENCOUNTER — Inpatient Hospital Stay: Payer: Medicaid Other | Attending: Family Medicine

## 2020-11-07 DIAGNOSIS — D696 Thrombocytopenia, unspecified: Secondary | ICD-10-CM

## 2020-11-12 ENCOUNTER — Encounter (HOSPITAL_BASED_OUTPATIENT_CLINIC_OR_DEPARTMENT_OTHER): Payer: Medicaid Other | Admitting: Internal Medicine

## 2020-11-19 ENCOUNTER — Ambulatory Visit: Payer: Medicaid Other | Admitting: Internal Medicine

## 2020-11-21 ENCOUNTER — Ambulatory Visit (INDEPENDENT_AMBULATORY_CARE_PROVIDER_SITE_OTHER): Payer: Medicaid Other | Admitting: Internal Medicine

## 2020-11-21 ENCOUNTER — Other Ambulatory Visit: Payer: Self-pay

## 2020-11-21 ENCOUNTER — Encounter: Payer: Self-pay | Admitting: Internal Medicine

## 2020-11-21 VITALS — BP 131/90 | HR 96 | Temp 98.7°F | Resp 16 | Ht 72.0 in | Wt 195.4 lb

## 2020-11-21 DIAGNOSIS — F1120 Opioid dependence, uncomplicated: Secondary | ICD-10-CM | POA: Diagnosis not present

## 2020-11-21 DIAGNOSIS — B182 Chronic viral hepatitis C: Secondary | ICD-10-CM

## 2020-11-21 DIAGNOSIS — K7469 Other cirrhosis of liver: Secondary | ICD-10-CM | POA: Diagnosis not present

## 2020-11-21 DIAGNOSIS — K760 Fatty (change of) liver, not elsewhere classified: Secondary | ICD-10-CM | POA: Diagnosis not present

## 2020-11-21 DIAGNOSIS — L03113 Cellulitis of right upper limb: Secondary | ICD-10-CM | POA: Diagnosis not present

## 2020-11-21 MED ORDER — SOFOSBUVIR-VELPATASVIR 400-100 MG PO TABS: 1.0000 | ORAL_TABLET | Freq: Every day | ORAL | 5 refills | Status: DC

## 2020-11-21 NOTE — Progress Notes (Signed)
   Subjective:    Patient ID: Chase Reed, male    DOB: 02-14-1966, 55 y.o.   MRN: [id number]  HPI He is here for hsfu He has a history of IVDA and has been ongoing with associated chronic hepatitis C, genotype 1a s/p 24 weeks of Epclusa with his decompensated cirrhosis.  He was recently hospitalized for right arm abscess s/p I and D and culture grew out MSSA.  He has been on Augmentin and completed 3 weeks post discharge.  His arm is doing well, no redness, no pain and incision is closed.   Unfortunately, he also has hepatitis C again, genotype 3.  Though he has ongoing substance abuse, he endorses only clean, unshared needles.  He has not been successful with opioid replacement therapy but wants to quit IVDU.  He has a history of cirrhosis already.    Review of Systems  Constitutional: Negative for fatigue and unexpected weight change.  Gastrointestinal: Negative for diarrhea and nausea.  Skin: Negative for rash.       Objective:   Physical Exam Constitutional:      Appearance: Normal appearance.  Eyes:     General: No scleral icterus. Cardiovascular:     Rate and Rhythm: Normal rate and regular rhythm.  Pulmonary:     Effort: Pulmonary effort is normal.  Neurological:     General: No focal deficit present.     Mental Status: He is alert.  Psychiatric:        Mood and Affect: Mood normal.    SH: continued drug use       Assessment & Plan:

## 2020-11-22 ENCOUNTER — Encounter: Payer: Self-pay | Admitting: Internal Medicine

## 2020-11-22 NOTE — Assessment & Plan Note (Signed)
I will check a CT scan for [facility] screening, he is overdue.

## 2020-11-22 NOTE — Assessment & Plan Note (Signed)
I discussed at length the absolute need to quit drug use and he is very hopeful he can find a path to quit.  He did not do well with suboxone but hopeful for methadone despite the sedating effects.

## 2020-11-22 NOTE — Assessment & Plan Note (Signed)
This is completely healed, resolved.  No further antibiotics indicated.

## 2020-11-22 NOTE — Assessment & Plan Note (Addendum)
He again has hepatitis C and a different genotype which represents a new infection in the setting of decompensated cirrhosis.  Will try to get Epclusa again for 24 weeks.   He will follow up with me after/if he is able to get started on therapy, 1 month after starting.

## 2020-11-30 ENCOUNTER — Telehealth: Payer: Self-pay

## 2020-11-30 NOTE — Telephone Encounter (Signed)
[facility] Patient Advocate Encounter   Received notification from MEDICAID that prior authorization for EPCLUSA(GENERIC) is required.   PA submitted on [id number] [address] 11/30/20 Status is pending  Medicaid only let me choose 8 weeks of medication. I saw in RX30 patient started meds on 10/23/19-03/12/20.    [facility] Clinic will continue to follow.   Clearance Coots, CPhT Specialty Pharmacy Patient [facility] for Infectious Disease Phone: [phone] Fax:  [phone]

## 2020-12-03 ENCOUNTER — Ambulatory Visit
Admission: RE | Admit: 2020-12-03 | Discharge: 2020-12-03 | Disposition: A | Discharge status: Home / Self Care | Payer: Medicaid Other | Source: Ambulatory Visit | Attending: Internal Medicine | Admitting: Internal Medicine

## 2020-12-03 DIAGNOSIS — K760 Fatty (change of) liver, not elsewhere classified: Secondary | ICD-10-CM

## 2020-12-03 MED ORDER — IOPAMIDOL (ISOVUE-300) INJECTION 61%
100.0000 mL | Freq: Once | INTRAVENOUS | Status: AC | PRN
  Administered 2020-12-03: 100 mL via INTRAVENOUS

## 2020-12-04 ENCOUNTER — Telehealth: Payer: Self-pay

## 2020-12-04 MED FILL — SOFOSBUVIR-VELPATASVIR 400-: 400-100 | 28 days supply | Qty: 28 | Fill #0

## 2020-12-04 NOTE — Telephone Encounter (Signed)
[facility] Patient Advocate Encounter  Prior Authorization for Dorita Fray  has been approved.     Effective dates: 11/30/20 through 01/29/21  Patients co-pay is $3.00.   I will have to do another PA to continue the therapy and send updated lab work with the PA renewal.  [facility] Clinic will continue to follow.  Clearance Coots, CPhT Specialty Pharmacy Patient [facility] for Infectious Disease Phone: [phone] Fax:  [phone]

## 2020-12-10 ENCOUNTER — Ambulatory Visit: Payer: Medicaid Other

## 2020-12-10 ENCOUNTER — Other Ambulatory Visit (HOSPITAL_BASED_OUTPATIENT_CLINIC_OR_DEPARTMENT_OTHER): Payer: Self-pay | Admitting: Internal Medicine

## 2020-12-10 ENCOUNTER — Ambulatory Visit: Payer: Medicaid Other | Attending: Internal Medicine

## 2020-12-10 DIAGNOSIS — Z23 Encounter for immunization: Secondary | ICD-10-CM

## 2020-12-10 MED FILL — MODERNA COVID-19 VACCINE 10: 100 | 28 days supply | Qty: 0 | Fill #0

## 2020-12-10 NOTE — Progress Notes (Signed)
   Covid-19 Vaccination Clinic  Name:  Chase Reed    MRN: [id number] DOB: 29-Jan-1966  12/10/2020  [name] was observed post Covid-19 immunization for 15 minutes without incident. He was provided with Vaccine Information Sheet and instruction to access the V-Safe system.   [name] was instructed to call 911 with any severe reactions post vaccine: Marland Kitchen Difficulty breathing  . Swelling of face and throat  . A fast heartbeat  . A bad rash all over body  . Dizziness and weakness   Immunizations Administered    Name Date Dose VIS Date Route   Moderna COVID-19 Vaccine 12/10/2020  1:31 PM 0.5 mL 08/22/2020 Intramuscular   Manufacturer: Moderna   Lot: [id number]   NDC: 24097-353-29

## 2020-12-18 DIAGNOSIS — F329 Major depressive disorder, single episode, unspecified: Secondary | ICD-10-CM | POA: Diagnosis present

## 2020-12-28 ENCOUNTER — Other Ambulatory Visit (HOSPITAL_COMMUNITY): Payer: Self-pay | Admitting: Psychiatry

## 2020-12-28 DIAGNOSIS — R41840 Attention and concentration deficit: Secondary | ICD-10-CM

## 2020-12-28 DIAGNOSIS — F333 Major depressive disorder, recurrent, severe with psychotic symptoms: Secondary | ICD-10-CM

## 2021-01-01 MED FILL — SOFOSBUVIR-VELPATASVIR 400-: 400-100 | 28 days supply | Qty: 28 | Fill #1

## 2021-01-08 ENCOUNTER — Telehealth: Payer: Self-pay | Admitting: *Deleted

## 2021-01-08 ENCOUNTER — Other Ambulatory Visit: Payer: Self-pay

## 2021-01-08 ENCOUNTER — Encounter: Payer: Self-pay | Admitting: Internal Medicine

## 2021-01-08 ENCOUNTER — Ambulatory Visit (INDEPENDENT_AMBULATORY_CARE_PROVIDER_SITE_OTHER): Payer: Medicaid Other | Admitting: Internal Medicine

## 2021-01-08 VITALS — BP 107/71 | HR 52 | Temp 97.4°F | Wt 195.0 lb

## 2021-01-08 DIAGNOSIS — K7469 Other cirrhosis of liver: Secondary | ICD-10-CM | POA: Diagnosis not present

## 2021-01-08 DIAGNOSIS — L959 Vasculitis limited to the skin, unspecified: Secondary | ICD-10-CM | POA: Diagnosis not present

## 2021-01-08 DIAGNOSIS — B182 Chronic viral hepatitis C: Secondary | ICD-10-CM | POA: Diagnosis present

## 2021-01-08 MED ORDER — BACIT-POLY-NEO HC 1 % EX OINT: 1.0000 "application " | TOPICAL_OINTMENT | Freq: Two times a day (BID) | CUTANEOUS | 1 refills | Status: DC

## 2021-01-08 NOTE — Telephone Encounter (Signed)
Chase Reed from [facility] called to let Dr Luciana Axe know that they are unable to get the cortisporin 1% prescription cream, but that the patient should substitute over the counter hydrocortisone 1% cream. Please advise. Andree Coss, RN

## 2021-01-08 NOTE — Telephone Encounter (Signed)
Attempted to call patient to relay Dr Comer's advice. Call was answered by another person who said Savas was not there, would be back later this evening. RN will forward to triage pool to follow. Andree Coss, RN

## 2021-01-08 NOTE — Telephone Encounter (Signed)
Ok, he can use hydrocortisone and continue with the neosporin he already has in addition.  thanks

## 2021-01-09 NOTE — Telephone Encounter (Signed)
Patient advised to use OTC hydrocortisone cream and neosporin. Patient verbalized understanding.

## 2021-01-10 ENCOUNTER — Encounter: Payer: Self-pay | Admitting: Internal Medicine

## 2021-01-10 NOTE — Assessment & Plan Note (Signed)
Doing well on his treatment with Epclusa.  Plan for 24 weeks.  Will check his labs today.

## 2021-01-10 NOTE — Progress Notes (Signed)
   Subjective:    Patient ID: Chase Reed, male    DOB: 03-23-1966, 55 y.o.   MRN: [id number]  HPI Here for follow up of hepatitis C. He has a history of IVDA and chronic hepatitis C with resultant decompensated cirrhosis and had been successfully treated with epclusa for 24 weeks for genotype 1a infection.  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 He is tolerating it well.  He has new findings on both hands of ulcerated lesions that have appeared over the recently weeks.  He was placed on an antibiotics by his PCP for cellulitis though has had no warmth, erythema and antibiotics have not helped.  He does not think he has had significant sun exposure.  No history of this before.  Some discomfort but no pain, no itch.    Review of Systems  Constitutional: Negative for fatigue.  Gastrointestinal: Negative for diarrhea and nausea.  Skin: Positive for rash.       Objective:   Physical Exam Constitutional:      Appearance: Normal appearance.  Eyes:     General: No scleral icterus. Cardiovascular:     Rate and Rhythm: Normal rate and regular rhythm.     Heart sounds: No murmur heard.   Pulmonary:     Effort: Pulmonary effort is normal.  Skin:    Comments: Bilateral hands with 3 ulcerated areas about 67mm with central erythema and no surrounding warmth. No drainage, dry.    Neurological:     Mental Status: He is alert.   SH: occasional IV drug use, hopeful to quit       Assessment & Plan:

## 2021-01-10 NOTE — Assessment & Plan Note (Signed)
Recent HCC screening with a CT and cirrhosis noted but no concerns otherwise.  Will continue with ultrasound every 6 months.

## 2021-01-10 NOTE — Assessment & Plan Note (Signed)
This is a new finding and does not look typical of porphyria cutanea tarda but unclear without a biopsy.  Labs sent and ANCA negative, ANA negative.  I most suspect it is related to hepatitis C and therefore would clear up with treatment.  I have given him a topical steroid to use with his topical antibiotic.  He will call if it worsens and we can try to get a biopsy of the area.

## 2021-01-14 LAB — CBC WITH DIFFERENTIAL/PLATELET
Absolute Monocytes: 186 cells/uL — ABNORMAL LOW (ref 200–950)
Basophils Absolute: 20 cells/uL (ref 0–200)
Basophils Relative: 1 %
Eosinophils Absolute: 78 cells/uL (ref 15–500)
Eosinophils Relative: 3.9 %
HCT: 33.6 % — ABNORMAL LOW (ref 38.5–50.0)
Hemoglobin: 11.5 g/dL — ABNORMAL LOW (ref 13.2–17.1)
Lymphs Abs: 450 cells/uL — ABNORMAL LOW (ref 850–3900)
MCH: 30.3 pg (ref 27.0–33.0)
MCHC: 34.2 g/dL (ref 32.0–36.0)
MCV: 88.7 fL (ref 80.0–100.0)
MPV: 11.2 fL (ref 7.5–12.5)
Monocytes Relative: 9.3 %
Neutro Abs: 1266 cells/uL — ABNORMAL LOW (ref 1500–7800)
Neutrophils Relative %: 63.3 %
Platelets: 50 10*3/uL — ABNORMAL LOW (ref 140–400)
RBC: 3.79 10*6/uL — ABNORMAL LOW (ref 4.20–5.80)
RDW: 13.4 % (ref 11.0–15.0)
Total Lymphocyte: 22.5 %
WBC: 2 10*3/uL — ABNORMAL LOW (ref 3.8–10.8)

## 2021-01-14 LAB — COMPLETE METABOLIC PANEL WITH GFR
AG Ratio: 1.4 (calc) (ref 1.0–2.5)
ALT: 9 U/L (ref 9–46)
AST: 14 U/L (ref 10–35)
Albumin: 4.3 g/dL (ref 3.6–5.1)
Alkaline phosphatase (APISO): 67 U/L (ref 35–144)
BUN/Creatinine Ratio: 16 (calc) (ref 6–22)
BUN: 22 mg/dL (ref 7–25)
CO2: 25 mmol/L (ref 20–32)
Calcium: 9.4 mg/dL (ref 8.6–10.3)
Chloride: 100 mmol/L (ref 98–110)
Creat: 1.35 mg/dL — ABNORMAL HIGH (ref 0.70–1.33)
GFR, Est African American: 69 mL/min/{1.73_m2} (ref >=60)
GFR, Est Non African American: 60 mL/min/{1.73_m2} (ref >=60)
Globulin: 3 g/dL (calc) (ref 1.9–3.7)
Glucose, Bld: 166 mg/dL — ABNORMAL HIGH (ref 65–99)
Potassium: 4.4 mmol/L (ref 3.5–5.3)
Sodium: 136 mmol/L (ref 135–146)
Total Bilirubin: 0.4 mg/dL (ref 0.2–1.2)
Total Protein: 7.3 g/dL (ref 6.1–8.1)

## 2021-01-14 LAB — RHEUMATOID FACTOR: Rheumatoid fact SerPl-aCnc: <14 IU/mL (ref <14)

## 2021-01-14 LAB — HEPATITIS C RNA QUANTITATIVE
HCV Quantitative Log: <1.18 log IU/mL
HCV RNA, PCR, QN: <15 IU/mL

## 2021-01-14 LAB — HEPATITIS B SURFACE ANTIGEN: Hepatitis B Surface Ag: NONREACTIVE

## 2021-01-14 LAB — ANACHOICE SPEC AB CASCADING REFLEX: ANACHOICE (R) SCREEN: NEGATIVE

## 2021-01-14 LAB — ANCA SCREEN W REFLEX TITER: ANCA Screen: NEGATIVE

## 2021-01-14 LAB — CRYOGLOBULIN: Cryoglobulin, Qualitative Analysis: NOT DETECTED

## 2021-01-23 ENCOUNTER — Other Ambulatory Visit (HOSPITAL_COMMUNITY): Payer: Self-pay | Admitting: Internal Medicine

## 2021-01-23 ENCOUNTER — Other Ambulatory Visit: Payer: Self-pay | Admitting: Internal Medicine

## 2021-01-23 MED ORDER — SOFOSBUVIR-VELPATASVIR 400-100 MG PO TABS
1.0000 | ORAL_TABLET | Freq: Every day | ORAL | 5 refills | Status: DC
Start: 2021-01-23 — End: 2021-01-24

## 2021-01-24 ENCOUNTER — Other Ambulatory Visit: Payer: Self-pay | Admitting: Internal Medicine

## 2021-01-24 MED ORDER — SOFOSBUVIR-VELPATASVIR 400-100 MG PO TABS: 1.0000 | ORAL_TABLET | Freq: Every day | ORAL | 3 refills | Status: DC

## 2021-01-24 MED FILL — EPCLUSA 400 MG-100 MG TAB: 400-100 | 28 days supply | Qty: 28 | Fill #0

## 2021-01-25 ENCOUNTER — Other Ambulatory Visit: Payer: Self-pay

## 2021-01-25 DIAGNOSIS — L959 Vasculitis limited to the skin, unspecified: Secondary | ICD-10-CM

## 2021-01-25 DIAGNOSIS — B182 Chronic viral hepatitis C: Secondary | ICD-10-CM

## 2021-01-25 MED ORDER — BACIT-POLY-NEO HC 1 % EX OINT: 1.0000 "application " | TOPICAL_OINTMENT | Freq: Two times a day (BID) | CUTANEOUS | 1 refills | Status: DC

## 2021-01-31 ENCOUNTER — Telehealth: Payer: Self-pay

## 2021-01-31 NOTE — Telephone Encounter (Signed)
Received call from [facility] pharmacy, they state they do not carry cortisporin ointment. Per phone note on 01/08/21, patient was instructed to use over the counter hydrocortisone and neosporin.   Sandie Ano, RN

## 2021-02-13 ENCOUNTER — Ambulatory Visit: Payer: Medicaid Other | Admitting: Internal Medicine

## 2021-02-21 ENCOUNTER — Other Ambulatory Visit (HOSPITAL_COMMUNITY): Payer: Self-pay

## 2021-02-21 MED FILL — Sofosbuvir-Velpatasvir Tab 400-100 MG: ORAL | 28 days supply | Qty: 28 | Fill #0 | Status: CN

## 2021-02-25 ENCOUNTER — Telehealth: Payer: Self-pay

## 2021-02-25 ENCOUNTER — Other Ambulatory Visit (HOSPITAL_COMMUNITY): Payer: Self-pay

## 2021-02-25 ENCOUNTER — Other Ambulatory Visit: Payer: Self-pay | Admitting: Pharmacist

## 2021-02-25 DIAGNOSIS — B182 Chronic viral hepatitis C: Secondary | ICD-10-CM

## 2021-02-25 MED ORDER — SOFOSBUVIR-VELPATASVIR 400-100 MG PO TABS
1.0000 | ORAL_TABLET | Freq: Every day | ORAL | 2 refills | Status: DC
Start: 2021-02-25 — End: 2022-02-05
  Filled 2021-02-25: qty 28, 28d supply, fill #0
  Filled 2021-03-25: qty 28, 28d supply, fill #1
  Filled 2021-04-17: qty 28, 28d supply, fill #2

## 2021-02-25 MED FILL — Sofosbuvir-Velpatasvir Tab 400-100 MG: ORAL | 28 days supply | Qty: 28 | Fill #0 | Status: CN

## 2021-02-25 NOTE — Telephone Encounter (Signed)
[facility] Patient Advocate Encounter  Prior Authorization for Epclusa (generic) has been approved.    PA# [id number] Effective dates: 02/25/21 through 0724/22  Patients co-pay is $3.00.   [facility] will continue to follow.  Clearance Coots, CPhT [facility]ty Pharmacy Patient [facility] Phone: [phone] Fax:  [phone]

## 2021-03-01 ENCOUNTER — Ambulatory Visit: Payer: Medicaid Other | Admitting: Internal Medicine

## 2021-03-01 ENCOUNTER — Telehealth: Payer: Self-pay

## 2021-03-01 NOTE — Telephone Encounter (Signed)
RN called patient regarding today's missed appointment. He states his medicaid transportation did not show up. RN offered patient MyChart/telephone visit. He states he does not have access to his MyChart and that he cannot do a phone visit because he needs Dr. Luciana Axe to look at some things. Patient scheduled for 03/08/21.   Sandie Ano, RN

## 2021-03-08 ENCOUNTER — Ambulatory Visit: Payer: Medicaid Other | Admitting: Internal Medicine

## 2021-03-12 ENCOUNTER — Ambulatory Visit: Payer: Medicaid Other | Admitting: Internal Medicine

## 2021-03-18 ENCOUNTER — Other Ambulatory Visit (HOSPITAL_COMMUNITY): Payer: Self-pay | Admitting: Psychiatry

## 2021-03-18 DIAGNOSIS — F333 Major depressive disorder, recurrent, severe with psychotic symptoms: Secondary | ICD-10-CM

## 2021-03-18 DIAGNOSIS — R41840 Attention and concentration deficit: Secondary | ICD-10-CM

## 2021-03-19 ENCOUNTER — Ambulatory Visit: Payer: Medicaid Other | Admitting: Internal Medicine

## 2021-03-21 ENCOUNTER — Other Ambulatory Visit (HOSPITAL_COMMUNITY): Payer: Self-pay

## 2021-03-25 ENCOUNTER — Other Ambulatory Visit (HOSPITAL_COMMUNITY): Payer: Self-pay

## 2021-04-15 ENCOUNTER — Other Ambulatory Visit (HOSPITAL_COMMUNITY): Payer: Self-pay

## 2021-04-17 ENCOUNTER — Other Ambulatory Visit (HOSPITAL_COMMUNITY): Payer: Self-pay

## 2021-04-18 ENCOUNTER — Other Ambulatory Visit (HOSPITAL_COMMUNITY): Payer: Self-pay

## 2021-05-01 ENCOUNTER — Ambulatory Visit: Payer: Medicaid Other | Admitting: Internal Medicine

## 2021-05-13 ENCOUNTER — Other Ambulatory Visit: Payer: Self-pay | Admitting: Pharmacist

## 2021-05-13 ENCOUNTER — Other Ambulatory Visit: Payer: Self-pay

## 2021-05-13 ENCOUNTER — Encounter: Payer: Self-pay | Admitting: Internal Medicine

## 2021-05-13 ENCOUNTER — Other Ambulatory Visit (HOSPITAL_COMMUNITY): Payer: Self-pay

## 2021-05-13 ENCOUNTER — Ambulatory Visit (INDEPENDENT_AMBULATORY_CARE_PROVIDER_SITE_OTHER): Payer: Medicaid Other | Admitting: Internal Medicine

## 2021-05-13 VITALS — BP 184/113 | HR 89 | Temp 98.1°F | Wt 187.0 lb

## 2021-05-13 DIAGNOSIS — R2242 Localized swelling, mass and lump, left lower limb: Secondary | ICD-10-CM

## 2021-05-13 DIAGNOSIS — B182 Chronic viral hepatitis C: Secondary | ICD-10-CM

## 2021-05-13 DIAGNOSIS — K7469 Other cirrhosis of liver: Secondary | ICD-10-CM

## 2021-05-14 IMAGING — CT CT ABDOMEN W/ CM
2 of 5 series · 12 of 46 positions shown, 14 images · IV contrast (iopamidol)
Comparison: Abdominal ultrasound 08/03/2019 and CT abdomen and
pelvis December 23, 2018.

CLINICAL DATA: History of hepatitis C, cirrhosis, HCC screening.

EXAM:
CT ABDOMEN WITH CONTRAST
TECHNIQUE: Multidetector CT imaging of the abdomen was performed using the
standard protocol following bolus administration of intravenous
contrast.
CONTRAST:  100mL JP2BMB-T99 IOPAMIDOL (JP2BMB-T99) INJECTION 61%

[Series 2: abd with 5.00 br40 s3 axial · axial · 0.63mm/px · z∈[+1458,+1703]mm · 9 of 59 slices shown, 11 images]
[im 5/59  soft-tissue]
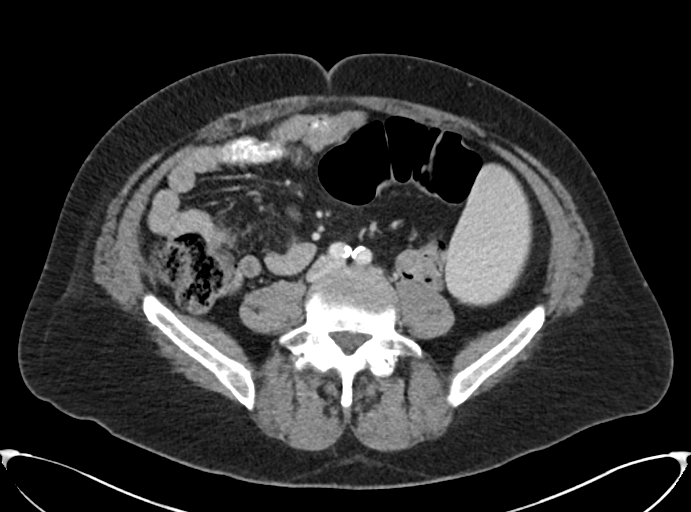
[im 5/59  bone]
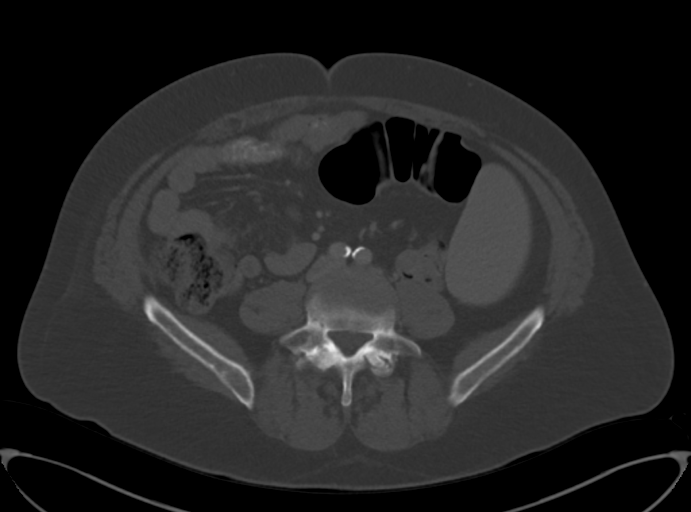
[im 14/59  soft-tissue]
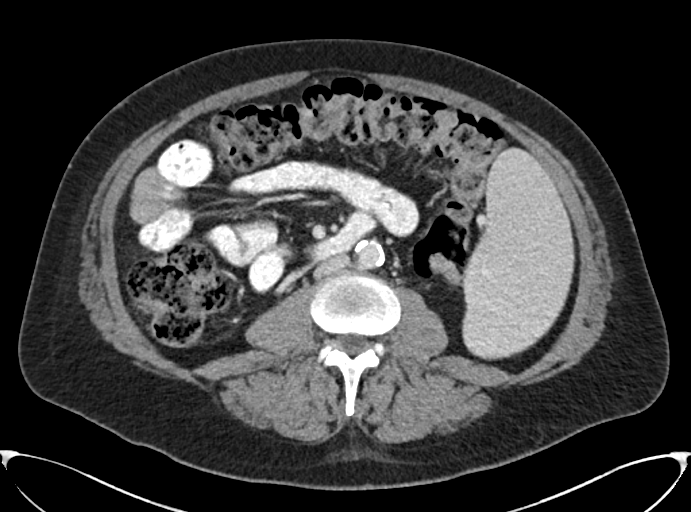
[im 18/59  soft-tissue]
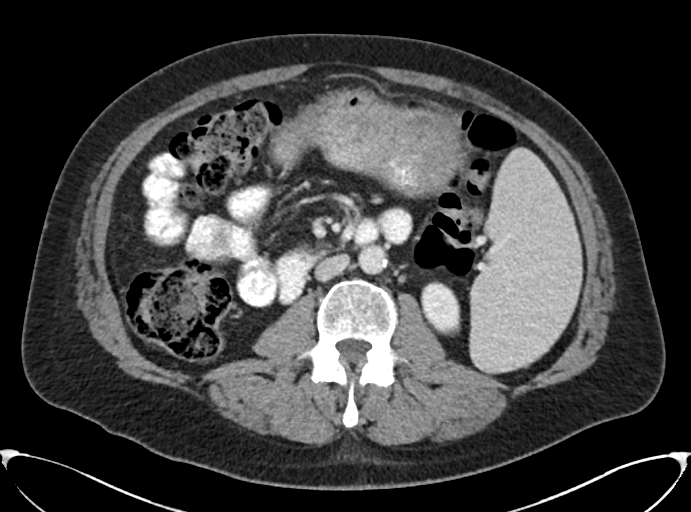
[im 23/59  soft-tissue]
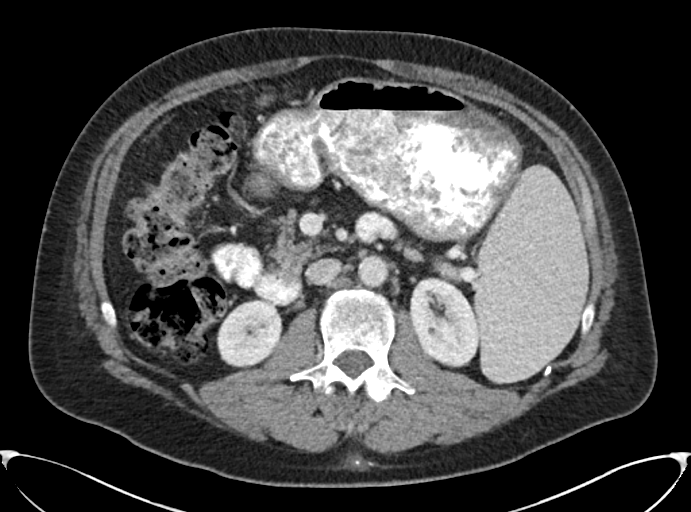
[im 32/59  soft-tissue]
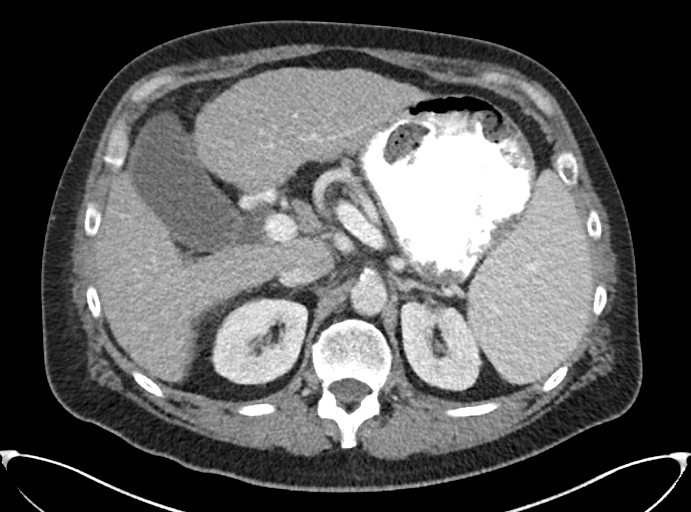
[im 36/59  soft-tissue]
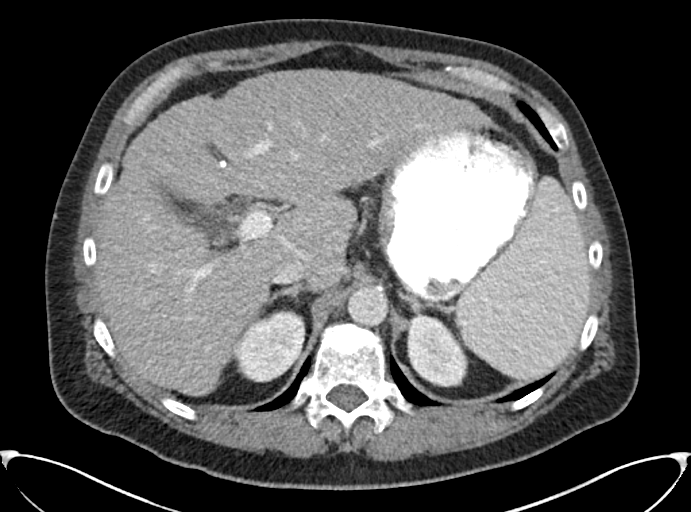
[im 41/59  soft-tissue]
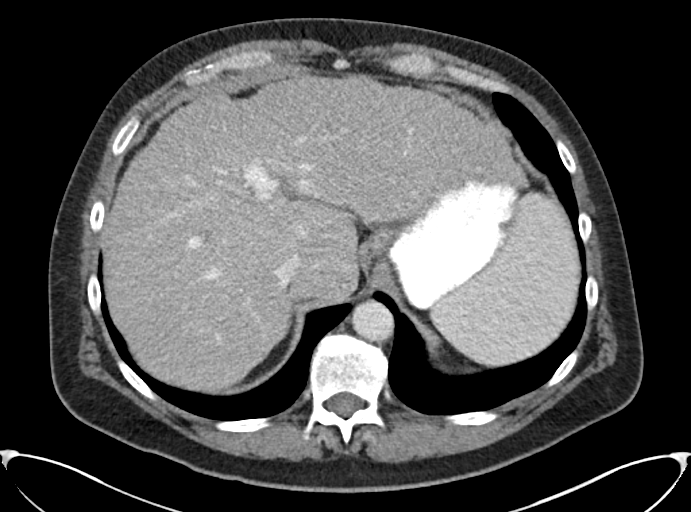
[im 50/59  soft-tissue]
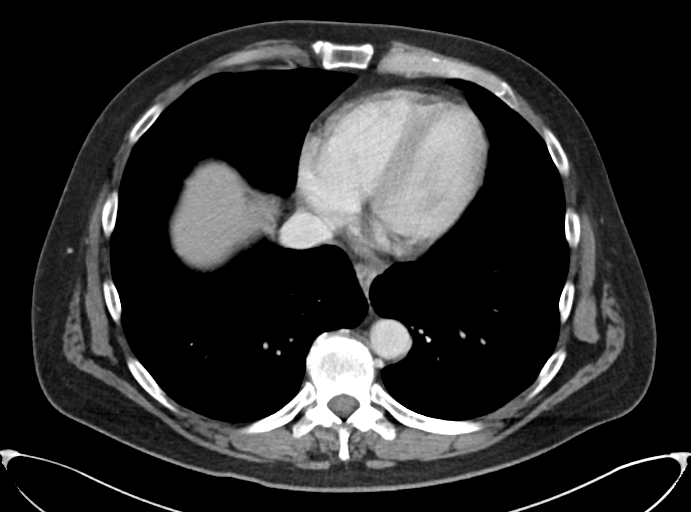
[im 54/59  soft-tissue]
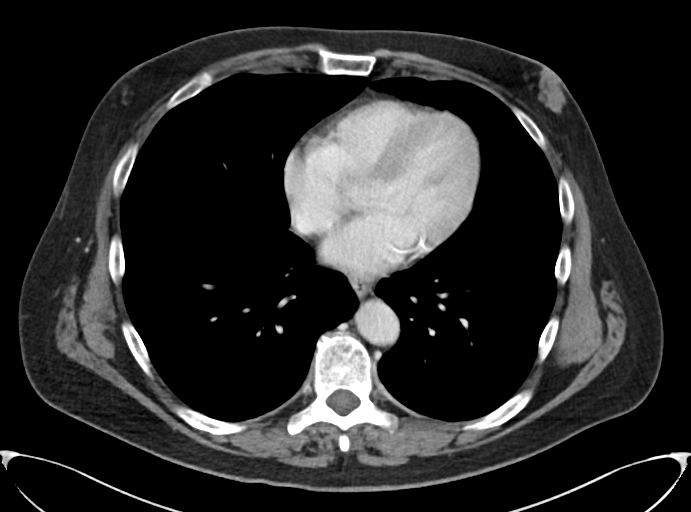
[im 54/59  bone]
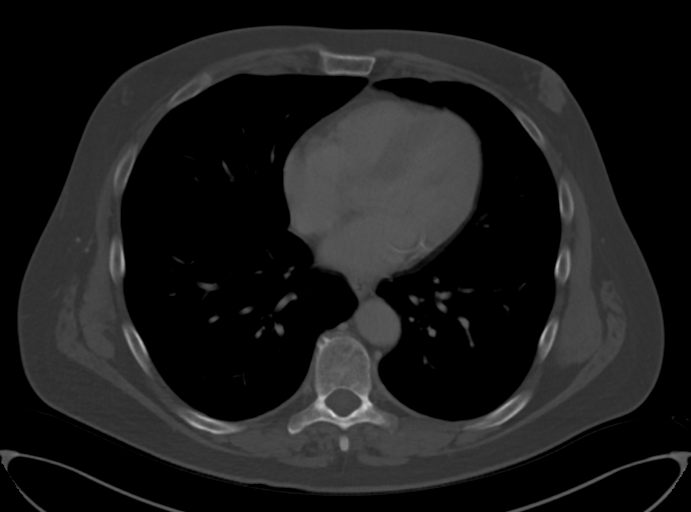

[Series 8: abd with 2.00 br40 s3 cor · coronal · 0.58mm/px · 3 of 161 slices shown]
[im 54/161  soft-tissue]
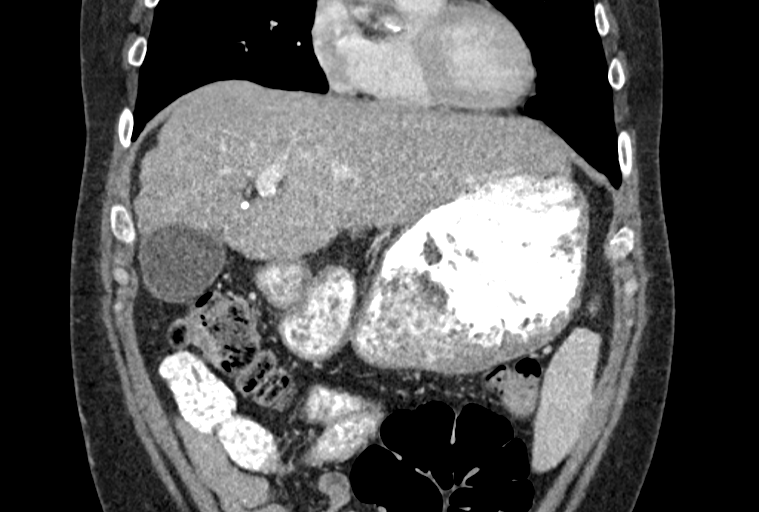
[im 72/161  soft-tissue]
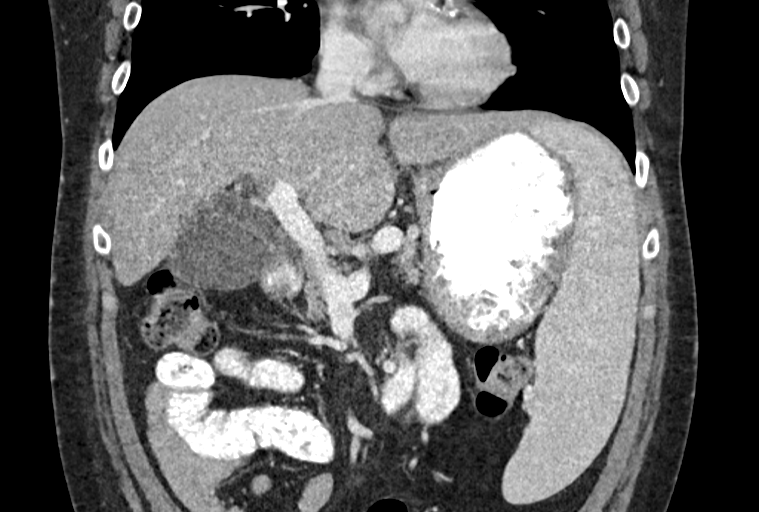
[im 89/161  soft-tissue]
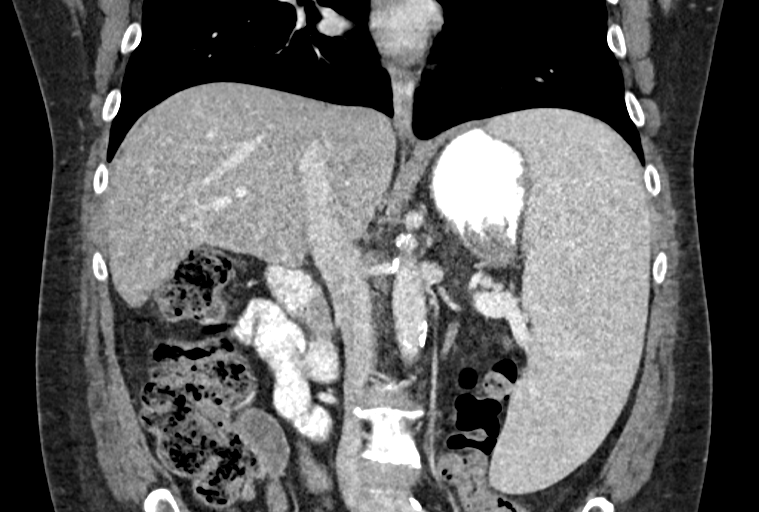

[12 of 46 positions shown; findings below may reference images not displayed]

FINDINGS: Lower chest: No acute abnormality.  Gynecomastia.

Hepatobiliary: Cirrhotic hepatic morphology. No suspicious
arterially enhancing lesion. Gallbladder is unremarkable. No biliary
ductal dilatation.

Pancreas: Unremarkable. No pancreatic ductal dilatation or
surrounding inflammatory changes.

Spleen: Splenomegaly with the spleen measuring 22 cm. Calcified
splenic granuloma.

Adrenals/Urinary Tract: Adrenal glands are unremarkable. Kidneys are
normal, without renal calculi, focal lesion, or hydronephrosis. No
filling defect in the renal collecting system on delayed imaging.

Stomach/Bowel: Stomach is distended without evidence of suspicious
wall thickening. No evidence of abnormal small bowel thickening or
obstruction.

Vascular/Lymphatic: Aortic atherosclerosis.

Other: No abdominopelvic ascites.

Musculoskeletal: Multilevel degenerative changes spine. No acute
osseous abnormality.
IMPRESSION: 1. Cirrhotic hepatic morphology without suspicious arterially
enhancing lesion.
2. Sequela of portal hypertension including splenomegaly.
3. Aortic atherosclerosis.

Aortic Atherosclerosis (T2J90-H2Q.Q).

## 2021-05-15 ENCOUNTER — Other Ambulatory Visit (HOSPITAL_COMMUNITY): Payer: Self-pay

## 2021-05-15 ENCOUNTER — Encounter: Payer: Self-pay | Admitting: Internal Medicine

## 2021-05-15 ENCOUNTER — Other Ambulatory Visit: Payer: Self-pay | Admitting: Pharmacist

## 2021-05-15 DIAGNOSIS — R2242 Localized swelling, mass and lump, left lower limb: Secondary | ICD-10-CM | POA: Insufficient documentation

## 2021-05-15 DIAGNOSIS — B182 Chronic viral hepatitis C: Secondary | ICD-10-CM

## 2021-05-15 LAB — HEPATITIS C RNA QUANTITATIVE
HCV Quantitative Log: <1.18 log IU/mL
HCV RNA, PCR, QN: <15 IU/mL

## 2021-05-15 NOTE — Assessment & Plan Note (Signed)
Will continue with [facility] screening Schedule for ultrasound

## 2021-05-15 NOTE — Progress Notes (Signed)
   Subjective:    Patient ID: Chase Reed, male    DOB: [birth year], 55 y.o.   MRN: [id number]  HPI He is here for follow up of chronic hepatitis C.  Previous history: He has a history of IVDA and chronic hepatitis C with resultant decompensated cirrhosis and had been successfully treated with epclusa for 24 weeks for genotype 1a infection.  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  He is here for follow up after missing previous appointments and has been continuing Epclusa with no issues with a plan for 24 weeks through about 1 more month.  Last HCV viral load was < 15.   His other complaints is he sustained a fall on 7/4 and bruised his shoulder and left foot.  His left foot remains bruised, swollen and painful.  He did not seek medical care for this.     Review of Systems  Constitutional:  Negative for fatigue and unexpected weight change.  Gastrointestinal:  Negative for diarrhea and nausea.  Skin:  Negative for rash.      Objective:   Physical Exam Eyes:     General: No scleral icterus. Pulmonary:     Effort: Pulmonary effort is normal.  Musculoskeletal:     Comments: Left foot swollen, tender, bruised Left shoulder with good ROM, some bruising noted  Neurological:     Mental Status: He is alert.   SH: + tobacco       Assessment & Plan:

## 2021-05-15 NOTE — Assessment & Plan Note (Signed)
New problem and I am concerned he has a fracture.  I have sent him to orthopedic urgent care for evaluation.

## 2021-05-15 NOTE — Assessment & Plan Note (Signed)
He is doing well with the Epclusa and plan to complete 24 weeks with one more refill to go.  Will repeat the viral load today and he will return in about 3 months and again about 3 months later for rechecks.

## 2021-05-23 ENCOUNTER — Ambulatory Visit
Admission: RE | Admit: 2021-05-23 | Discharge: 2021-05-23 | Disposition: A | Discharge status: Home / Self Care | Payer: Medicaid Other | Source: Ambulatory Visit | Attending: Internal Medicine | Admitting: Internal Medicine

## 2021-05-23 DIAGNOSIS — B182 Chronic viral hepatitis C: Secondary | ICD-10-CM

## 2021-05-23 DIAGNOSIS — K7469 Other cirrhosis of liver: Secondary | ICD-10-CM

## 2021-06-03 ENCOUNTER — Telehealth: Payer: Self-pay

## 2021-06-03 ENCOUNTER — Other Ambulatory Visit (HOSPITAL_COMMUNITY): Payer: Self-pay | Admitting: Psychiatry

## 2021-06-03 DIAGNOSIS — F333 Major depressive disorder, recurrent, severe with psychotic symptoms: Secondary | ICD-10-CM

## 2021-06-03 DIAGNOSIS — R41840 Attention and concentration deficit: Secondary | ICD-10-CM

## 2021-06-03 NOTE — Telephone Encounter (Signed)
Attempted to call patient. Not able to reach him at this time. Will call back at a later time.

## 2021-06-03 NOTE — Telephone Encounter (Signed)
Patient called office today requesting [place]. Will forward message to MD to review results. Will call patient back with providers message. Juanita Laster, RMA

## 2021-06-04 NOTE — Telephone Encounter (Signed)
Called patient to relay [place] results. Did not have any questions at this time. Juanita Laster, RMA

## 2021-06-14 ENCOUNTER — Encounter (HOSPITAL_BASED_OUTPATIENT_CLINIC_OR_DEPARTMENT_OTHER): Payer: Self-pay | Admitting: Emergency Medicine

## 2021-06-14 ENCOUNTER — Emergency Department (HOSPITAL_BASED_OUTPATIENT_CLINIC_OR_DEPARTMENT_OTHER): Payer: Medicaid Other

## 2021-06-14 ENCOUNTER — Emergency Department (HOSPITAL_BASED_OUTPATIENT_CLINIC_OR_DEPARTMENT_OTHER)
Admission: EM | Admit: 2021-06-14 | Discharge: 2021-06-14 | Disposition: A | Discharge status: Home / Self Care | Payer: Medicaid Other | Attending: Emergency Medicine | Admitting: Emergency Medicine

## 2021-06-14 ENCOUNTER — Other Ambulatory Visit: Payer: Self-pay

## 2021-06-14 DIAGNOSIS — R55 Syncope and collapse: Secondary | ICD-10-CM | POA: Diagnosis present

## 2021-06-14 DIAGNOSIS — Z794 Long term (current) use of insulin: Secondary | ICD-10-CM | POA: Insufficient documentation

## 2021-06-14 DIAGNOSIS — F17210 Nicotine dependence, cigarettes, uncomplicated: Secondary | ICD-10-CM | POA: Diagnosis not present

## 2021-06-14 DIAGNOSIS — E119 Type 2 diabetes mellitus without complications: Secondary | ICD-10-CM | POA: Diagnosis not present

## 2021-06-14 DIAGNOSIS — E861 Hypovolemia: Secondary | ICD-10-CM | POA: Insufficient documentation

## 2021-06-14 DIAGNOSIS — N179 Acute kidney failure, unspecified: Secondary | ICD-10-CM | POA: Diagnosis not present

## 2021-06-14 DIAGNOSIS — I10 Essential (primary) hypertension: Secondary | ICD-10-CM | POA: Insufficient documentation

## 2021-06-14 LAB — BASIC METABOLIC PANEL
Anion gap: 9 (ref 5–15)
BUN: 30 mg/dL — ABNORMAL HIGH (ref 6–20)
CO2: 25 mmol/L (ref 22–32)
Calcium: 9.3 mg/dL (ref 8.9–10.3)
Chloride: 98 mmol/L (ref 98–111)
Creatinine, Ser: 2.34 mg/dL — ABNORMAL HIGH (ref 0.61–1.24)
GFR, Estimated: 33 mL/min — ABNORMAL LOW (ref >60)
Glucose, Bld: 227 mg/dL — ABNORMAL HIGH (ref 70–99)
Potassium: 4.4 mmol/L (ref 3.5–5.1)
Sodium: 132 mmol/L — ABNORMAL LOW (ref 135–145)

## 2021-06-14 LAB — BRAIN NATRIURETIC PEPTIDE: B Natriuretic Peptide: 28.2 pg/mL (ref 0.0–100.0)

## 2021-06-14 LAB — TROPONIN I (HIGH SENSITIVITY): Troponin I (High Sensitivity): 7 ng/L (ref <18)

## 2021-06-14 LAB — CBC
HCT: 34.4 % — ABNORMAL LOW (ref 39.0–52.0)
Hemoglobin: 11.7 g/dL — ABNORMAL LOW (ref 13.0–17.0)
MCH: 29.8 pg (ref 26.0–34.0)
MCHC: 34 g/dL (ref 30.0–36.0)
MCV: 87.8 fL (ref 80.0–100.0)
Platelets: 55 10*3/uL — ABNORMAL LOW (ref 150–400)
RBC: 3.92 MIL/uL — ABNORMAL LOW (ref 4.22–5.81)
RDW: 15 % (ref 11.5–15.5)
WBC: 4.6 10*3/uL (ref 4.0–10.5)
nRBC: 0 % (ref 0.0–0.2)

## 2021-06-14 LAB — CBG MONITORING, ED: Glucose-Capillary: 149 mg/dL — ABNORMAL HIGH (ref 70–99)

## 2021-06-14 MED ORDER — LACTATED RINGERS IV BOLUS
1000.0000 mL | Freq: Once | INTRAVENOUS | Status: AC
  Administered 2021-06-14: 1000 mL via INTRAVENOUS

## 2021-06-14 NOTE — ED Notes (Signed)
Patient speaking with provider about leaving due to having a drug problem and needing methadone.  Patient is going to be discharged  Per provider

## 2021-06-14 NOTE — ED Provider Notes (Addendum)
[facility]   CSN: [id number] Arrival date & time: 06/14/21  1246     History Chief Complaint  Patient presents with   Dizziness    Robbie Lisravis E Lichtenberg is a 55 y.o. male.    55 year old male presenting to the emergency department with a complaint of a syncopal episode earlier today.  The patient states that he felt lightheaded earlier today and subsequently passed out when his vision went black.  He immediately came to on the ground.  He fell and hit his back on a table.  He endorses midthoracic back pain.  He denies any numbness, weakness, urinary or fecal incontinence, radicular pain, saddle anesthesias.  He denies any recent injection drug use.  He states that he goes to a methadone clinic.  He denies any fevers or chills.  He states that he has had poor oral intake over the past few days.  He states that he has had multiple syncopal episodes over the past week that have been similar.  He denies chest pain or shortness of breath.  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   Cirrhosis (HCC) 12/24/2018   Substance induced mood disorder (HCC) 12/21/2016   Opioid use disorder, severe, dependence (HCC) 05/02/2016   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   Elevated lipase 05/02/2016    Past Surgical History:  Procedure Laterality Date   APPLICATION OF WOUND VAC N/A 12/24/2018    Procedure: APPLICATION OF WOUND VAC;  Surgeon: Alleen BorneBartle, Bryan K, MD;  Location: [facility] OR;  Service: Thoracic;  Laterality: N/A;   APPLICATION OF WOUND VAC N/A 12/27/2018   Procedure: WOUND VAC CHANGE;  Surgeon: Alleen BorneBartle, Bryan K, MD;  Location: [facility] OR;  Service: Thoracic;  Laterality: N/A;   APPLICATION OF WOUND VAC N/A 01/14/2019   Procedure: APPLICATION OF WOUND VAC;  Surgeon: Alleen BorneBartle, Bryan K, MD;  Location: [facility] OR;  Service: Vascular;  Laterality: N/A;   I & D EXTREMITY N/A 01/14/2019   Procedure: DEBRIDEMENT OF STERNOCLAVICULAR JOINT;  Surgeon: Alleen BorneBartle, Bryan K, MD;  Location: [facility] OR;  Service: Vascular;  Laterality: N/A;   I & D EXTREMITY Right 10/03/2020   Procedure: Irrigation debridement of RIGHT forearm subfascial abscess measuring approximately 6 x 3 x 1.5 cm;  Surgeon: Ernest Mallickreighton, Yailen Zemaitis J III, MD;  Location: [facility] OR;  Service: Orthopedics;  Laterality: Right;   LITHOTRIPSY     STERNAL WOUND DEBRIDEMENT N/A 12/24/2018   Procedure: I&D CHEST WALL ABSCESS;  Surgeon: Alleen BorneBartle, Bryan K, MD;  Location: [facility] OR;  Service: Thoracic;  Laterality: N/A;   STERNAL WOUND DEBRIDEMENT N/A 12/27/2018   Procedure: DEBRIDEMENT OF CHEST WALL WOUND;  Surgeon: Alleen BorneBartle, Bryan K, MD;  Location: [facility] OR;  Service: Thoracic;  Laterality: N/A;   TEE WITHOUT CARDIOVERSION N/A 01/03/2019   Procedure: TRANSESOPHAGEAL ECHOCARDIOGRAM (TEE);  Surgeon: Thurmon Fairroitoru, Mihai, MD;  [facility];  Service: Cardiovascular;  Laterality: N/A;       Family History  Problem Relation Age of Onset   COPD Father     Social History   Tobacco Use   Smoking status: Some Days    Packs/day: 0.25    Years: 30.00    Pack years: 7.50    Types: Cigarettes   Smokeless tobacco: Former  Building services engineer Use: Never used  Substance Use Topics   Alcohol use: No   Drug use: Yes    Types: Heroin, IV    Comment: Fentanyl and "Oxy" per patient     Home Medications Prior to Admission medications   Medication Sig Start Date End Date Taking?  Authorizing Provider  amoxicillin-clavulanate (AUGMENTIN) 875-125 MG tablet TAKE 1 TABLET BY MOUTH TWO TIMES DAILY FOR 15 DAYS. 10/09/20 10/09/21  Dorcas Carrow, MD  atomoxetine (STRATTERA) 80 MG capsule Take 1 capsule (80 mg total) by mouth daily. 06/04/21   Shanna Cisco, NP  bacitracin-neomycin-polymyxin-hydrocortisone (CORTISPORIN) 1 % ointment Apply 1 application topically 2 (two) times daily. 01/25/21   Gardiner Barefoot, MD  COVID-19 mRNA vaccine, Moderna, 100 MCG/0.5ML injection INJECT AS DIRECTED 12/10/20 12/10/21  Judyann Munson, MD  DULoxetine (CYMBALTA) 60 MG capsule Take 60 mg by mouth daily. 05/07/21   [provider]  gabapentin (NEURONTIN) 600 MG tablet Take 600 mg by mouth in the morning, at noon, in the evening, and at bedtime.  01/20/19   [provider]  insulin aspart (NOVOLOG) 100 UNIT/ML injection Inject 2-8 Units into the skin See admin instructions. Sliding scale.over 250--2 units over 300--4 units over 350--6 units over 400--8 units Up to 50 units daily: For diabetes management Patient taking differently: Inject 2-8 Units into the skin See admin instructions. Take 2-8 units as needed  Sliding scale.over 250--2 units over 300--4 units over 350--6 units over 400--8 units Up to 50 units daily: For diabetes management 12/25/16   Armandina Stammer I, NP  insulin glargine (LANTUS) 100 UNIT/ML injection Inject 30 Units into the skin daily.     [provider]  Iron, Ferrous Sulfate, 325 (65 Fe) MG TABS Take 325 mg by mouth daily. 09/23/19   Jaci Standard, MD  JANUVIA 100 MG tablet Take 100 mg by mouth daily. 05/07/21   [provider]  lisinopril (ZESTRIL) 20 MG tablet Take 20 mg by mouth daily. 03/11/21   [provider]  metFORMIN (GLUCOPHAGE) 1000 MG tablet Take 1,000 mg by mouth 2 (two) times daily. 05/07/21   [provider]  risperiDONE (RISPERDAL) 2 MG tablet Take 1 tablet (2 mg total) by mouth at bedtime. 06/04/21   Shanna Cisco, NP  sertraline (ZOLOFT) 100 MG tablet Take by mouth. 12/18/20   [provider]  sertraline (ZOLOFT) 100 MG tablet Take 100 mg by mouth daily. 03/11/21   [provider]  Sofosbuvir-Velpatasvir (EPCLUSA) 400-100 MG TABS Take 1 tablet by mouth daily. 02/25/21   Kuppelweiser, Cassie L, RPH-CPP  traZODone (DESYREL) 50 MG tablet Take 2 tablets (100 mg total) by mouth at bedtime as needed for sleep. 07/25/20   Shanna Cisco, NP    Allergies    Atorvastatin  Review of Systems   Review of Systems  Constitutional:  Negative for chills and fever.  HENT:  Negative for ear pain and sore throat.   Eyes:  Negative for pain and visual disturbance.  Respiratory:  Negative for cough and shortness of breath.   Cardiovascular:  Negative for chest pain and palpitations.  Gastrointestinal:  Negative for abdominal pain and  vomiting.  Genitourinary:  Negative for dysuria and hematuria.  Musculoskeletal:  Negative for arthralgias and back pain.  Skin:  Negative for color change and rash.  Neurological:  Positive for syncope and light-headedness. Negative for dizziness and seizures.  All other systems reviewed and are negative.  Physical Exam Updated Vital Signs BP (!) 144/90   Pulse 61   Temp 98.2 F (36.8 C) (Oral)   Resp 16   Ht 6' (1.829 m)   Wt 83.9 kg   SpO2 99%   BMI 25.09 kg/m   Physical Exam Vitals and nursing note reviewed.  Constitutional:      Appearance: He is well-developed.     Comments: GCS 15, ABC intact  HENT:     Head: Normocephalic and atraumatic.     Mouth/Throat:     Mouth: Mucous membranes are dry.  Eyes:     Conjunctiva/sclera: Conjunctivae normal.  Neck:     Comments: No midline tenderness to palpation of the cervical spine. ROM intact. Cardiovascular:     Rate and Rhythm: Normal rate and regular rhythm.     Heart sounds: No murmur heard. Pulmonary:     Effort: Pulmonary effort is normal. No respiratory distress.     Breath  sounds: Normal breath sounds.  Chest:     Comments: Chest wall stable and non-tender to AP and lateral compression. Clavicles stable and non-tender to AP compression Abdominal:     Palpations: Abdomen is soft.     Tenderness: There is no abdominal tenderness.     Comments: Pelvis stable to lateral compression.  Musculoskeletal:     Cervical back: Neck supple.     Comments: No midline tenderness to palpation of the lumbar spine.  Tenderness palpation of the midline thoracic spine.  Extremities atraumatic with intact ROM.   Skin:    General: Skin is warm and dry.     Capillary Refill: Capillary refill takes less than 2 seconds.  Neurological:     Mental Status: He is alert.     Comments: CN II-XII grossly intact. Moving all four extremities spontaneously and sensation grossly intact.    ED Results / Procedures / Treatments   Labs (all labs ordered are listed, but only abnormal results are displayed) Labs Reviewed  BASIC METABOLIC PANEL - Abnormal; Notable for the following components:      Result Value   Sodium 132 (*)    Glucose, Bld 227 (*)    BUN 30 (*)    Creatinine, Ser 2.34 (*)    GFR, Estimated 33 (*)    All other components within normal limits  CBC - Abnormal; Notable for the following components:   RBC 3.92 (*)    Hemoglobin 11.7 (*)    HCT 34.4 (*)    Platelets 55 (*)    All other components within normal limits  CBG MONITORING, ED - Abnormal; Notable for the following components:   Glucose-Capillary 149 (*)    All other components within normal limits  BRAIN NATRIURETIC PEPTIDE  TROPONIN I (HIGH SENSITIVITY)    EKG EKG Interpretation  Date/Time:  Friday June 14 2021 12:53:35 EDT Ventricular Rate:  58 PR Interval:  140 QRS Duration: 70 QT Interval:  464 QTC Calculation: 455 R Axis:   34 Text Interpretation: Sinus bradycardia Otherwise normal ECG Confirmed by Vanetta Mulders [phone]) on 06/14/2021 1:16:53 PM  Radiology CT Thoracic Spine Wo  Contrast  Result Date: 06/14/2021 CLINICAL DATA:  Dizziness.  Ataxia.  Syncopal episodes.  Back pain.  EXAM: CT THORACIC SPINE WITHOUT CONTRAST TECHNIQUE: Multidetector CT images of the thoracic were obtained using the standard protocol without intravenous contrast. COMPARISON:  Chest radiography same day.  CT chest 01/10/2019. FINDINGS: Alignment: No traumatic malalignment. Mild increased kyphotic curvature of the spine. Very minimal scoliotic curvature. Vertebrae: No regional fracture or focal bone lesion. Chronic discogenic endplate sclerotic changes at T11-12 and T12-L1. Paraspinal and other soft tissues: Negative. No evidence of posteromedial rib fracture. Disc levels: No significant disc level pathology from T1-2 through T7-8. T8-9: Minimal disc bulge.  Mild facet osteoarthritis.  No stenosis. T9-10: Minimal disc bulge. Bilateral facet osteoarthritis. Some foraminal encroachment because of the facet disease. T10-11: Chronic calcified disc herniation. Mild facet osteoarthritis. Mild canal stenosis at this level, left more than right. T11-12: Minimal disc bulge.  No stenosis. T12-L1: Minimal disc bulge.  No stenosis. IMPRESSION: No acute or traumatic finding. Degenerative facet arthritis from T8-9 through T10-11, most pronounced at the T9-10 level. This could contribute to back pain. Facet osteophytes at T9-10 encroach upon the neural foramina. 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 Electronically Signed   By: Paulina Fusi M.D.   On: 06/14/2021 17:37   DG Chest Portable 1 View  Result Date: 06/14/2021 CLINICAL DATA:  Syncope.  Dizziness. EXAM: PORTABLE CHEST 1 VIEW COMPARISON:  Radiograph 10/07/2020 FINDINGS: The cardiomediastinal contours are normal. Mild interstitial coarsening. Pulmonary vasculature is normal. No consolidation, pleural effusion, or pneumothorax. No acute osseous abnormalities are seen. IMPRESSION: Mild interstitial  coarsening, likely smoking related lung disease. No acute findings. Electronically Signed   By: Narda Rutherford M.D.   On: 06/14/2021 17:25    Procedures Procedures   Medications Ordered in ED Medications  lactated ringers bolus 1,000 mL (0 mLs Intravenous Stopped 06/14/21 1828)    ED Course  I have reviewed the triage vital signs and the nursing notes.  Pertinent labs & imaging results that were available during my care of the patient were reviewed by me and considered in my medical decision making (see chart for details).    MDM Rules/Calculators/A&P                           55 year old male presenting to the emergency department with multiple episodes of syncope over the past few days.  Syncopal episode today resulted in a prior prodrome of nausea, vision turning black, subsequent syncope and striking his back.  He has had poor p.o. intake over the past few days and arrived to the emergency department ABC intact, GCS 15, with soft blood pressures noted, mildly bradycardic.  Heart rates noted in the 50s with blood pressure soft at 95/72.  On patient arrival in the exam room, he was alert and oriented x3 with no alert neurologic deficit.  He has midline tenderness to palpation of his thoracic spine.  Differential diagnosis includes vasovagal syncope, orthostatic hypotension, hypovolemia, less likely CVA or cardiac arrhythmia.  Considered hypoglycemia in the setting of poor p.o. intake.  Blood glucose on arrival 149.  CBC without a leukocytosis, anemia mild at 11.7, platelets low at 55.  The patient has a history of cirrhosis and suspect that his platelets are low due to this.  He has no significant ascites and no abdominal tenderness to palpation on exam.  The patient denies any head trauma.  No other traumatic injuries identified on physical exam.  X-ray of his chest and thoracic spine  was negative for acute fracture or malalignment of the spine, negative acute cardiac or pulmonary  abnormality.  Based on the [place] CT head rule, I do not think that imaging of the head is necessary at this time.  The patient's C-spine was cleared by Nexus criteria.  The patient's EKG revealed sinus bradycardia, rate close to normal at 58 with no evidence of heart block or abnormal intervals.  No evidence of WPW, arrhythmogenic right ventricular dysplasia, no ST segment changes.  The patient was fluid resuscitated and experienced good response to volume resuscitation.  Repeat blood pressure 144/90.  Troponin collected resulted normal, BNP normal, BMP with an AKI.  Suspect likely prerenal AKI in the setting of hypovolemia based on patient presentation.  The patient was offered admission but declined, preferring to continue to rehydrate outpatient.  I have low suspicion for cardiac etiology of the patient's syncope.  Low suspicion for acute CVA with a reassuring neurologic exam.  As the patient is overall well-appearing, I find this to be a reasonable plan.  Patient plans to follow-up outpatient as needed for recheck of his renal function.   Final Clinical Impression(s) / ED Diagnoses Final diagnoses:  AKI (acute kidney injury) (HCC)  Hypovolemia  Syncope, unspecified syncope type    Rx / DC Orders ED Discharge Orders     None        Ernie Avena, MD 06/15/21 1617    Ernie Avena, MD 06/15/21 1619

## 2021-06-14 NOTE — ED Triage Notes (Signed)
Pt arrives to ED with c/o of dizziness. Pt reports multiple syncopal episodes throughout this past week. Pt reports his at home BP as been low. Pt reports falling fall today after syncopal episode and fell hitting his back on the table.

## 2021-06-20 ENCOUNTER — Telehealth: Payer: Self-pay

## 2021-06-20 NOTE — Telephone Encounter (Signed)
Referral notes received from [facility], Phone #:[phone] , Fax #: [phone]  A copy of the notes have been placed in the scheduling box for check-out to pick-up and to enter referral. Original notes placed in file cabinet.

## 2021-07-01 ENCOUNTER — Other Ambulatory Visit: Payer: Self-pay

## 2021-07-01 ENCOUNTER — Ambulatory Visit (INDEPENDENT_AMBULATORY_CARE_PROVIDER_SITE_OTHER): Payer: Medicaid Other | Admitting: Cardiology

## 2021-07-01 ENCOUNTER — Telehealth: Payer: Self-pay | Admitting: Hematology and Oncology

## 2021-07-01 ENCOUNTER — Encounter (HOSPITAL_BASED_OUTPATIENT_CLINIC_OR_DEPARTMENT_OTHER): Payer: Self-pay | Admitting: Cardiology

## 2021-07-01 VITALS — HR 46 | Ht 72.0 in | Wt 188.2 lb

## 2021-07-01 DIAGNOSIS — E119 Type 2 diabetes mellitus without complications: Secondary | ICD-10-CM

## 2021-07-01 DIAGNOSIS — I959 Hypotension, unspecified: Secondary | ICD-10-CM

## 2021-07-01 DIAGNOSIS — R55 Syncope and collapse: Secondary | ICD-10-CM | POA: Diagnosis not present

## 2021-07-01 DIAGNOSIS — R600 Localized edema: Secondary | ICD-10-CM | POA: Diagnosis not present

## 2021-07-01 DIAGNOSIS — B182 Chronic viral hepatitis C: Secondary | ICD-10-CM

## 2021-07-01 DIAGNOSIS — F1120 Opioid dependence, uncomplicated: Secondary | ICD-10-CM

## 2021-07-01 DIAGNOSIS — Z794 Long term (current) use of insulin: Secondary | ICD-10-CM

## 2021-07-01 NOTE — Patient Instructions (Addendum)
Medication Instructions:  Your Physician recommend you continue on your current medication as directed.    *If you need a refill on your cardiac medications before your next appointment, please call your pharmacy*   Lab Work: None ordered today   Testing/Procedures: Your physician has requested that you have an echocardiogram. Echocardiography is a painless test that uses sound waves to create images of your heart. It provides your doctor with information about the size and shape of your heart and how well your heart's chambers and valves are working. This procedure takes approximately one hour. There are no restrictions for this procedure. [address]    Follow-Up: At [facility], you and your health needs are our priority.  As part of our continuing mission to provide you with exceptional heart care, we have created designated Provider Care Teams.  These Care Teams include your primary Cardiologist (physician) and Advanced Practice Providers (APPs -  Physician Assistants and Nurse Practitioners) who all work together to provide you with the care you need, when you need it.  We recommend signing up for the patient portal called "MyChart".  Sign up information is provided on this After Visit Summary.  MyChart is used to connect with patients for Virtual Visits (Telemedicine).  Patients are able to view lab/test results, encounter notes, upcoming appointments, etc.  Non-urgent messages can be sent to your provider as well.   To learn more about what you can do with MyChart, go to [email or web address].    Your next appointment:   2 month(s)  The format for your next appointment:   In Person  Provider:   Jodelle Red, MD   Other Instructions -avoid dehydration. Often it requires high volumes of fluids, often with salt/electrolytes included, to stay hydrated. People can be are very sensitive to fluid shifts and dehydration. Oral rehydration is  preferred, and routine use of IV fluids is not recommended. -if tolerated, compression stocking can assist with fluid management and prevent pooling in the legs. -slow position changes are recommended -if there is a feeling of severe lightheadedness, like near to passing out, recommend lying on the floor on the back, with legs elevated up on a chair or up against the wall.

## 2021-07-01 NOTE — Progress Notes (Signed)
Cardiology Office Note:    Date:  07/01/2021   ID:  Chase Reed, DOB 06/29/1966, MRN [id number]  PCP:  Courtney Heys, PA-C  Cardiologist:  Jodelle Red, MD  Referring MD: Courtney Heys,*   CC: new patient evaluation for orthostasis/syncope  History of Present Illness:    Chase Reed is a 55 y.o. male with a hx of type II diabetes, chronic hepatitis C on epclusa with cirrhosis,  who is seen as a new consult at the request of Courtney Heys,* for the evaluation and management of syncope.  ER note from 06/14/21 reviewed. Noted to have lightheaded sensation, vision went dark and he lost consciousness. Noted poor oral intake. Long term methadone use, no recent IVDA. Reports multiple similar spells over the preceding week. Referred to cardiology for further evaluation.  Today: He says his blood pressure is highly variable. Can be 220 systolic one day and then 70 systolic several days later. Sugars have been well controlled. Last A1c 6.6.  Saw his PCP a week ago. Labs reviewed, renal function back to his baseline at Cr 1.14. Na 139, K 5.1, Cl 101, Co2 25. LFTs normal.  He has historically taken lisinopril as needed based on blood pressure, reports that he was taking it "hardly ever" before events of the last month. Has not taken it since he started having syncope.  Syncope: Initial episode: has been occurring over the last month. First episode he was lightheaded, lost consciousness, and injured his foot. Has been frequent and increasing over the last few weeks. Denies anything new/different over that time. Medications have been stable. Has been eating well, drinking a lot of fluid but is urinating very frequently. Overnight has nocturia about once/hour, to the point that he now has a urinal at bedside. Urine is completely clear. Frequency: currently about once/day Duration of episodes: unclear Presyncopal symptoms: dizziness/lightheadedness Post  syncope symptoms: notes that he has had some confusion after. Has had hallucinations more, though not related to his syncopal spells (seeing people who aren't there, hearing people speaking when no one is present). He has had more depression/anxiety and irrational fear (per his comment) over the last few months to a year. Has not had changes to his medications. Aggravating/alleviating factors: none that he can tell Pre-existing medical conditions: type II diabetes, hypertension Potential medication/supplement interactions: current IV drug use with opiates  Notes IV drug use. Last use this AM. He used morphine, as he has a reaction to fentanyl. He has had the same supplier for several years. He uses every day for chronic pain. Used to be on methadone program, has not been able to get into this program for 6 years or so, at one time was clean for 12 years in the program.   Is going to be homeless starting 07/25/2023 as his roommate passed away.  Amenable to speaking with Child psychotherapist.  Does eat salt in his diet, eats bacon and meats. Has chronic LE edema, sometimes severe.   Denies chest pain, shortness of breath at rest or with normal exertion. No PND, orthopnea, or unexpected weight gain. No syncope or palpitations.   Past Medical History:  Diagnosis Date   Cirrhosis (HCC)    Diabetes mellitus without complication (HCC)    Hepatitis     Past Surgical History:  Procedure Laterality Date   APPLICATION OF WOUND VAC N/A 12/24/2018   Procedure: APPLICATION OF WOUND VAC;  Surgeon: Alleen Borne, MD;  Location: [facility] OR;  Service: Thoracic;  Laterality: N/A;   APPLICATION OF WOUND VAC N/A 12/27/2018   Procedure: WOUND VAC CHANGE;  Surgeon: Alleen Borne, MD;  Location: [facility] OR;  Service: Thoracic;  Laterality: N/A;   APPLICATION OF WOUND VAC N/A 01/14/2019   Procedure: APPLICATION OF WOUND VAC;  Surgeon: Alleen Borne, MD;  Location: [facility] OR;  Service: Vascular;  Laterality: N/A;   I & D EXTREMITY N/A  01/14/2019   Procedure: DEBRIDEMENT OF STERNOCLAVICULAR JOINT;  Surgeon: Alleen Borne, MD;  Location: [facility] OR;  Service: Vascular;  Laterality: N/A;   I & D EXTREMITY Right 10/03/2020   Procedure: Irrigation debridement of RIGHT forearm subfascial abscess measuring approximately 6 x 3 x 1.5 cm;  Surgeon: Ernest Mallick, MD;  Location: [facility] OR;  Service: Orthopedics;  Laterality: Right;   LITHOTRIPSY     STERNAL WOUND DEBRIDEMENT N/A 12/24/2018   Procedure: I&D CHEST WALL ABSCESS;  Surgeon: Alleen Borne, MD;  Location: [facility] OR;  Service: Thoracic;  Laterality: N/A;   STERNAL WOUND DEBRIDEMENT N/A 12/27/2018   Procedure: DEBRIDEMENT OF CHEST WALL WOUND;  Surgeon: Alleen Borne, MD;  Location: [facility] OR;  Service: Thoracic;  Laterality: N/A;   TEE WITHOUT CARDIOVERSION N/A 01/03/2019   Procedure: TRANSESOPHAGEAL ECHOCARDIOGRAM (TEE);  Surgeon: Thurmon Fair, MD;  Location: [facility];  Service: Cardiovascular;  Laterality: N/A;    Current Medications: Current Outpatient Medications on File Prior to Visit  Medication Sig   amoxicillin-clavulanate (AUGMENTIN) 875-125 MG tablet TAKE 1 TABLET BY MOUTH TWO TIMES DAILY FOR 15 DAYS.   atomoxetine (STRATTERA) 80 MG capsule Take 1 capsule (80 mg total) by mouth daily.   bacitracin-neomycin-polymyxin-hydrocortisone (CORTISPORIN) 1 % ointment Apply 1 application topically 2 (two) times daily.   COVID-19 mRNA vaccine, Moderna, 100 MCG/0.5ML injection INJECT AS DIRECTED   DULoxetine (CYMBALTA) 60 MG capsule Take 60 mg by mouth daily.   gabapentin (NEURONTIN) 600 MG tablet Take 600 mg by mouth in the morning, at noon, in the evening, and at bedtime.    insulin aspart (NOVOLOG) 100 UNIT/ML injection Inject 2-8 Units into the skin See admin instructions. Sliding scale.over 250--2 units over 300--4 units over 350--6 units over 400--8 units Up to 50 units daily: For diabetes management (Patient taking differently: Inject 2-8 Units into the skin See admin  instructions. Take 2-8 units as needed  Sliding scale.over 250--2 units over 300--4 units over 350--6 units over 400--8 units Up to 50 units daily: For diabetes management)   insulin glargine (LANTUS) 100 UNIT/ML injection Inject 30 Units into the skin daily.    Iron, Ferrous Sulfate, 325 (65 Fe) MG TABS Take 325 mg by mouth daily.   JANUVIA 100 MG tablet Take 100 mg by mouth daily.   metFORMIN (GLUCOPHAGE) 1000 MG tablet Take 1,000 mg by mouth 2 (two) times daily.   risperiDONE (RISPERDAL) 2 MG tablet Take 1 tablet (2 mg total) by mouth at bedtime.   sertraline (ZOLOFT) 100 MG tablet Take by mouth.   sertraline (ZOLOFT) 100 MG tablet Take 100 mg by mouth daily.   Sofosbuvir-Velpatasvir (EPCLUSA) 400-100 MG TABS Take 1 tablet by mouth daily.   traZODone (DESYREL) 50 MG tablet Take 2 tablets (100 mg total) by mouth at bedtime as needed for sleep.   No current facility-administered medications on file prior to visit.     Allergies:   Atorvastatin   Social History   Tobacco Use   Smoking status: Some Days    Packs/day: 0.25    Years:  30.00    Pack years: 7.50    Types: Cigarettes   Smokeless tobacco: Former  Building services engineer Use: Never used  Substance Use Topics   Alcohol use: No   Drug use: Yes    Types: Heroin, IV    Comment: Fentanyl and "Oxy" per patient     Family History: family history includes COPD in his father.  ROS:   Please see the history of present illness.  Additional pertinent ROS: Constitutional: Negative for chills, fever, night sweats, unintentional weight loss  HENT: Negative for ear pain and hearing loss.   Eyes: Negative for loss of vision and eye pain.  Respiratory: Negative for cough, sputum, wheezing.   Cardiovascular: See HPI. Gastrointestinal: Negative for abdominal pain, melena, and hematochezia.  Genitourinary: Negative for dysuria and hematuria.  Musculoskeletal: Negative for falls and myalgias.  Skin: Negative for itching and rash.  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 Luciana Axe Neurological: Negative for focal weakness, focal sensory changes and loss of consciousness.  Endo/Heme/Allergies: Does not bruise/bleed easily.     EKGs/Labs/Other Studies Reviewed:    The following studies were reviewed today: Echo 12/24/2018 1. The left ventricle has hyperdynamic systolic function, with an  ejection fraction of >65%. The cavity size was normal. Left ventricular  diastolic parameters were normal.   2. The right ventricle has normal systolic function. The cavity was  normal. There is no increase in right ventricular wall thickness. Right  ventricular systolic pressure could not be assessed.   3. Trivial pericardial effusion is present.   4. The pericardial effusion is circumferential.   5. No hemodynamically significant valvular stenosis or regurgitation.   6. The mitral valve is normal in structure. Mild calcification of the  mitral valve leaflet.   7. The tricuspid valve is normal in structure.   8. The aortic valve has an indeterminant number of cusps.   9. The pulmonic valve was normal in structure.  10. No definite valvular vegetations. Image quality is not sufficient for  very small valvular lesions.   TEE 01/03/2019 1. The left ventricle has normal systolic function, with an ejection  fraction of 60-65%. The cavity size was normal.   2. The right ventricle has normal systolc function. The cavity was  normal. There is no increase in right ventricular wall thickness.   3. The mitral valve is normal in structure.   4. The tricuspid valve was normal in structure.   5. The aortic valve is normal in structure.   6. The pulmonic valve was normal in structure.   7. The aortic root, ascending aorta, aortic arch and descending aorta are  normal in size and structure.   8. There is evidence of plaque in the descending aorta.   9. No echo evidence of endocarditis.   EKG:  EKG is personally  reviewed.   07/01/2021 Sinus bradycardia at 46 bpm  Recent Labs: 01/08/2021: ALT 9 06/14/2021: B Natriuretic Peptide 28.2; BUN 30; Creatinine, Ser 2.34; Hemoglobin 11.7; Platelets 55; Potassium 4.4; Sodium 132  Recent Lipid Panel No results found for: CHOL, TRIG, HDL, CHOLHDL, VLDL, LDLCALC, LDLDIRECT  Physical Exam:    VS:  Pulse (!) 46   Ht 6' (1.829 m)   Wt 188 lb 3.2 oz (85.4 kg)   SpO2 98%   BMI 25.52 kg/m    Orthostatic VS for the past 24 hrs (Last 3 readings):  BP- Lying Pulse- Lying BP- Sitting Pulse- Sitting BP- Standing  at 0 minutes Pulse- Standing at 0 minutes BP- Standing at 3 minutes Pulse- Standing at 3 minutes  07/01/21 1044 93/59 (!) 46 100/59 (!) 49 (!) 86/57 51 (!) 78/54 51     Wt Readings from Last 3 Encounters:  07/01/21 188 lb 3.2 oz (85.4 kg)  06/14/21 185 lb (83.9 kg)  05/13/21 187 lb (84.8 kg)    GEN: Well nourished, well developed in no acute distress HEENT: Normal, moist mucous membranes NECK: No JVD CARDIAC: regular rhythm, normal S1 and S2, no rubs or gallops. No murmur. VASCULAR: Radial and DP pulses 2+ bilaterally. No carotid bruits RESPIRATORY:  Clear to auscultation without rales, wheezing or rhonchi  ABDOMEN: Soft, non-tender, non-distended MUSCULOSKELETAL:  Ambulates independently SKIN: Warm and dry, scattered crusted lesions across feet and hands. Bilateral 1+ pitting edema. NEUROLOGIC:  Alert and oriented x 3. No focal neuro deficits noted. PSYCHIATRIC:  Normal affect    ASSESSMENT:    1. Hypotension, unspecified hypotension type   2. Bilateral leg edema   3. Type 2 diabetes mellitus without complication, with long-term current use of insulin (HCC)   4. Chronic hepatitis C without hepatic coma (HCC)   5. Opioid use disorder, severe, dependence (HCC)   6. Syncope, unspecified syncope type    PLAN:    Syncope Hypotension LE edema -echocardiogram (last 2020) will be repeated given new symptoms -not currently taking lisinopril, would  not take given his hypotension -I am not certain what is causing this phenomenon. His diabetes has been well controlled. He is using IV drugs daily but feels better after he has used--has more symptoms before he uses. -he is hydrating well, eating well -discussed compression stockings, slow position changes -as he is on no current antihypertensives, there are no medications that should be the cause. May be related to IV opiate use. I am hesitant to use midodrine as he has a history of intermittent hypertension, sometimes 220 systolic -he will try to use stockings, hydration. He will monitor pattern of his symptoms -he had sinus bradycardia at baseline. No indication for pacemaker at this time. If etiology remains unclear, would consider monitor.   Opioid IV drug use Homelessness -will see if we can assist with a community program through our social worker  History of recurrent infections, chest wounds -last debridement of sternal wounds 01/2019  Type II diabetes -reports sugars have been well controlled -insulin per PCP  History of hepatitis C Pancytopenia History of cirrosis -LE edema concerning, though recent LFTs normal -S/P epclusa  Cardiac risk counseling and prevention recommendations: -recommend heart healthy/Mediterranean diet, with whole grains, fruits, vegetable, fish, lean meats, nuts, and olive oil. Limit salt. -recommend moderate walking, 3-5 times/week for 30-50 minutes each session. Aim for at least 150 minutes.week. Goal should be pace of 3 miles/hours, or walking 1.5 miles in 30 minutes -recommend avoidance of tobacco products. Avoid excess alcohol. -ASCVD risk score: The ASCVD Risk score Denman George([place]off [place] Montez HagemanJr., et al., 2013) failed to calculate for the following reasons:   Cannot find a previous HDL lab    Plan for follow up: 2 mos or sooner as needed  Jodelle RedBridgette Markitta Ausburn, MD, PhD, [facility] CenterFACC [place]  [facility]    Medication Adjustments/Labs and Tests  Ordered: Current medicines are reviewed at length with the patient today.  Concerns regarding medicines are outlined above.  Orders Placed This Encounter  Procedures   EKG 12-Lead   ECHOCARDIOGRAM COMPLETE   No orders of the defined types were placed in this encounter.  Patient Instructions  Medication Instructions:  Your Physician recommend you continue on your current medication as directed.    *If you need a refill on your cardiac medications before your next appointment, please call your pharmacy*   Lab Work: None ordered today   Testing/Procedures: Your physician has requested that you have an echocardiogram. Echocardiography is a painless test that uses sound waves to create images of your heart. It provides your doctor with information about the size and shape of your heart and how well your heart's chambers and valves are working. This procedure takes approximately one hour. There are no restrictions for this procedure. [address]    Follow-Up: At [facility], you and your health needs are our priority.  As part of our continuing mission to provide you with exceptional heart care, we have created designated Provider Care Teams.  These Care Teams include your primary Cardiologist (physician) and Advanced Practice Providers (APPs -  Physician Assistants and Nurse Practitioners) who all work together to provide you with the care you need, when you need it.  We recommend signing up for the patient portal called "MyChart".  Sign up information is provided on this After Visit Summary.  MyChart is used to connect with patients for Virtual Visits (Telemedicine).  Patients are able to view lab/test results, encounter notes, upcoming appointments, etc.  Non-urgent messages can be sent to your provider as well.   To learn more about what you can do with MyChart, go to [email or web address].    Your next appointment:   2 month(s)  The format for your next  appointment:   In Person  Provider:   Jodelle Red, MD   Other Instructions -avoid dehydration. Often it requires high volumes of fluids, often with salt/electrolytes included, to stay hydrated. People can be are very sensitive to fluid shifts and dehydration. Oral rehydration is preferred, and routine use of IV fluids is not recommended. -if tolerated, compression stocking can assist with fluid management and prevent pooling in the legs. -slow position changes are recommended -if there is a feeling of severe lightheadedness, like near to passing out, recommend lying on the floor on the back, with legs elevated up on a chair or up against the wall.  Signed, Jodelle Red, MD PhD 07/01/2021 1:12 PM    [facility]

## 2021-07-01 NOTE — Telephone Encounter (Signed)
Scheduled per sch msg. Called and left msg  

## 2021-07-12 ENCOUNTER — Inpatient Hospital Stay: Payer: Medicaid Other

## 2021-07-12 ENCOUNTER — Inpatient Hospital Stay: Payer: Medicaid Other | Attending: Hematology and Oncology | Admitting: Hematology and Oncology

## 2021-07-12 DIAGNOSIS — D61818 Other pancytopenia: Secondary | ICD-10-CM | POA: Insufficient documentation

## 2021-07-12 DIAGNOSIS — E119 Type 2 diabetes mellitus without complications: Secondary | ICD-10-CM | POA: Insufficient documentation

## 2021-07-12 DIAGNOSIS — B1920 Unspecified viral hepatitis C without hepatic coma: Secondary | ICD-10-CM | POA: Insufficient documentation

## 2021-07-12 DIAGNOSIS — F1190 Opioid use, unspecified, uncomplicated: Secondary | ICD-10-CM | POA: Insufficient documentation

## 2021-07-12 DIAGNOSIS — K7460 Unspecified cirrhosis of liver: Secondary | ICD-10-CM | POA: Insufficient documentation

## 2021-07-12 DIAGNOSIS — R161 Splenomegaly, not elsewhere classified: Secondary | ICD-10-CM | POA: Insufficient documentation

## 2021-07-12 DIAGNOSIS — D649 Anemia, unspecified: Secondary | ICD-10-CM | POA: Insufficient documentation

## 2021-07-16 ENCOUNTER — Other Ambulatory Visit: Payer: Self-pay

## 2021-07-16 ENCOUNTER — Inpatient Hospital Stay (HOSPITAL_BASED_OUTPATIENT_CLINIC_OR_DEPARTMENT_OTHER): Payer: Medicaid Other | Admitting: Hematology and Oncology

## 2021-07-16 ENCOUNTER — Inpatient Hospital Stay: Payer: Medicaid Other

## 2021-07-16 VITALS — BP 98/60 | HR 55 | Temp 98.3°F | Resp 18 | Ht 72.0 in | Wt 194.1 lb

## 2021-07-16 DIAGNOSIS — D500 Iron deficiency anemia secondary to blood loss (chronic): Secondary | ICD-10-CM

## 2021-07-16 DIAGNOSIS — F1190 Opioid use, unspecified, uncomplicated: Secondary | ICD-10-CM | POA: Diagnosis not present

## 2021-07-16 DIAGNOSIS — D61818 Other pancytopenia: Secondary | ICD-10-CM | POA: Diagnosis not present

## 2021-07-16 DIAGNOSIS — D649 Anemia, unspecified: Secondary | ICD-10-CM | POA: Diagnosis not present

## 2021-07-16 DIAGNOSIS — R161 Splenomegaly, not elsewhere classified: Secondary | ICD-10-CM | POA: Diagnosis not present

## 2021-07-16 DIAGNOSIS — E119 Type 2 diabetes mellitus without complications: Secondary | ICD-10-CM | POA: Diagnosis not present

## 2021-07-16 DIAGNOSIS — K7460 Unspecified cirrhosis of liver: Secondary | ICD-10-CM | POA: Diagnosis not present

## 2021-07-16 DIAGNOSIS — B1920 Unspecified viral hepatitis C without hepatic coma: Secondary | ICD-10-CM | POA: Diagnosis not present

## 2021-07-16 DIAGNOSIS — M25572 Pain in left ankle and joints of left foot: Secondary | ICD-10-CM | POA: Diagnosis not present

## 2021-07-16 DIAGNOSIS — D696 Thrombocytopenia, unspecified: Secondary | ICD-10-CM

## 2021-07-16 LAB — CBC WITH DIFFERENTIAL (CANCER CENTER ONLY)
Abs Immature Granulocytes: 0 10*3/uL (ref 0.00–0.07)
Basophils Absolute: 0 10*3/uL (ref 0.0–0.1)
Basophils Relative: 0 %
Eosinophils Absolute: 0.1 10*3/uL (ref 0.0–0.5)
Eosinophils Relative: 3 %
HCT: 27.3 % — ABNORMAL LOW (ref 39.0–52.0)
Hemoglobin: 9.2 g/dL — ABNORMAL LOW (ref 13.0–17.0)
Immature Granulocytes: 0 %
Lymphocytes Relative: 16 %
Lymphs Abs: 0.4 10*3/uL — ABNORMAL LOW (ref 0.7–4.0)
MCH: 30.2 pg (ref 26.0–34.0)
MCHC: 33.7 g/dL (ref 30.0–36.0)
MCV: 89.5 fL (ref 80.0–100.0)
Monocytes Absolute: 0.2 10*3/uL (ref 0.1–1.0)
Monocytes Relative: 9 %
Neutro Abs: 1.6 10*3/uL — ABNORMAL LOW (ref 1.7–7.7)
Neutrophils Relative %: 72 %
Platelet Count: 40 10*3/uL — ABNORMAL LOW (ref 150–400)
RBC: 3.05 MIL/uL — ABNORMAL LOW (ref 4.22–5.81)
RDW: 14.3 % (ref 11.5–15.5)
WBC Count: 2.3 10*3/uL — ABNORMAL LOW (ref 4.0–10.5)
nRBC: 0 % (ref 0.0–0.2)

## 2021-07-16 LAB — CMP (CANCER CENTER ONLY)
ALT: 15 U/L (ref 0–44)
AST: 16 U/L (ref 15–41)
Albumin: 3.9 g/dL (ref 3.5–5.0)
Alkaline Phosphatase: 105 U/L (ref 38–126)
Anion gap: 9 (ref 5–15)
BUN: 18 mg/dL (ref 6–20)
CO2: 24 mmol/L (ref 22–32)
Calcium: 9.2 mg/dL (ref 8.9–10.3)
Chloride: 104 mmol/L (ref 98–111)
Creatinine: 1.63 mg/dL — ABNORMAL HIGH (ref 0.61–1.24)
GFR, Estimated: 50 mL/min — ABNORMAL LOW (ref >60)
Glucose, Bld: 165 mg/dL — ABNORMAL HIGH (ref 70–99)
Potassium: 4.6 mmol/L (ref 3.5–5.1)
Sodium: 137 mmol/L (ref 135–145)
Total Bilirubin: 0.3 mg/dL (ref 0.3–1.2)
Total Protein: 7.1 g/dL (ref 6.5–8.1)

## 2021-07-16 NOTE — Progress Notes (Signed)
[facility] [phone]   [phone]  PROGRESS NOTE  Patient Care Team: Courtney Heys, PA-C as PCP - General (Family Medicine) Jodelle Red, MD as PCP - Cardiology (Cardiology)  Hematological/Oncological History # Pancytopenia 1) 12/23/2018: WBC 8.7, Hgb 11.8, Plt 97. Admitted for chest wall abscess.  2) 12/25/2018: WBC 3.9, hgb 9.5, Plt 75 3) 01/11/2019: WBC 2.3, Hgb 10.1, Plt 42 4) 01/18/2019: WBC 1.8, Hgb 9.3, Plt 55. Discharged 01/21/2019 5) 09/20/2019: Establish care with Dr. Leonides Reed. WBC 3.4, Hgb 11.8, MCV 87.6, Plt 35  6)  04/12/2020: WBC 2.1, Hgb 11.8, MCV 92, Plt 43.  7) 04/30/2020: WBC 2.8, Hgb 12.1, MCV 89.5, Plt 46  Interval History:  Chase Reed 55 y.o. 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 The patient's last visit was on 04/30/21. In the interim since the last visit the patient's blood counts have failed to improve.   On exam today Chase Reed notes that he has been having some troubles with falls and dizziness.  He notes over the past few months he did unfortunately sprained his ankle due to a fall.  He notes that he has not had any recent issues with bleeding or dark bowel movements.  He notes that he is urinating quite frequently up to every 30 minutes.  He notes that his bowel movements are not dark and are typically light and somewhat loose.  He currently denies any fevers, chills, sweats, nausea, vomiting or diarrhea.  Full 10 point ROS is listed below.  MEDICAL HISTORY:  Past Medical History:  Diagnosis Date   Cirrhosis (HCC)    Diabetes mellitus without complication (HCC)    Hepatitis     SURGICAL HISTORY: Past Surgical History:  Procedure Laterality Date   APPLICATION OF WOUND VAC N/A 12/24/2018   Procedure: APPLICATION OF WOUND VAC;  Surgeon: Alleen Borne, MD;  [facility];  Service: Thoracic;  Laterality: N/A;    APPLICATION OF WOUND VAC N/A 12/27/2018   Procedure: WOUND VAC CHANGE;  Surgeon: Alleen Borne, MD;  Location: [facility];  Service: Thoracic;  Laterality: N/A;   APPLICATION OF WOUND VAC N/A 01/14/2019   Procedure: APPLICATION OF WOUND VAC;  Surgeon: Alleen Borne, MD;  Location: [facility];  Service: Vascular;  Laterality: N/A;   I & D EXTREMITY N/A 01/14/2019   Procedure: DEBRIDEMENT OF STERNOCLAVICULAR JOINT;  Surgeon: Alleen Borne, MD;  Location: [facility] OR;  Service: Vascular;  Laterality: N/A;   I & D EXTREMITY Right 10/03/2020   Procedure: Irrigation debridement of RIGHT forearm subfascial abscess measuring approximately 6 x 3 x 1.5 cm;  Surgeon: Ernest Mallick, MD;  Location: [facility];  Service: Orthopedics;  Laterality: Right;   LITHOTRIPSY     STERNAL WOUND DEBRIDEMENT N/A 12/24/2018   Procedure: I&D CHEST WALL ABSCESS;  Surgeon: Alleen Borne, MD;  [facility];  Service: Thoracic;  Laterality: N/A;   STERNAL WOUND DEBRIDEMENT N/A 12/27/2018   Procedure: DEBRIDEMENT OF CHEST WALL WOUND;  Surgeon: Alleen Borne, MD;  [facility];  Service: Thoracic;  Laterality: N/A;   TEE WITHOUT CARDIOVERSION N/A 01/03/2019   Procedure: TRANSESOPHAGEAL ECHOCARDIOGRAM (TEE);  Surgeon: Thurmon Fair, MD;  Location: [facility] ENDOSCOPY;  Service: Cardiovascular;  Laterality: N/A;    SOCIAL HISTORY: Social History   Socioeconomic History   Marital status: Married    Spouse name: Not on file  Number of children: Not on file   Years of education: Not on file   Highest education level: Not on file  Occupational History   Occupation: Building Education administrator  Tobacco Use   Smoking status: Some Days    Packs/day: 0.25    Years: 30.00    Pack years: 7.50    Types: Cigarettes   Smokeless tobacco: Former  Building services engineer Use: Never used  Substance and Sexual Activity   Alcohol use: No   Drug use: Yes    Types: Heroin, IV    Comment: Fentanyl and "Oxy" per patient    Sexual activity: Never  Other  Topics Concern   Not on file  Social History Narrative   Not on file   Social Determinants of Health   Financial Resource Strain: Not on file  Food Insecurity: Not on file  Transportation Needs: Not on file  Physical Activity: Not on file  Stress: Not on file  Social Connections: Not on file  Intimate Partner Violence: Not on file    FAMILY HISTORY: Family History  Problem Relation Age of Onset   COPD Father     ALLERGIES:  is allergic to atorvastatin.  MEDICATIONS:  Current Outpatient Medications  Medication Sig Dispense Refill   atomoxetine (STRATTERA) 80 MG capsule Take 1 capsule (80 mg total) by mouth daily. 30 capsule 2   bacitracin-neomycin-polymyxin-hydrocortisone (CORTISPORIN) 1 % ointment Apply 1 application topically 2 (two) times daily. 15 g 1   COVID-19 mRNA vaccine, Moderna, 100 MCG/0.5ML injection INJECT AS DIRECTED .25 mL 0   DULoxetine (CYMBALTA) 60 MG capsule Take 60 mg by mouth daily.     gabapentin (NEURONTIN) 600 MG tablet Take 600 mg by mouth in the morning, at noon, in the evening, and at bedtime.      insulin aspart (NOVOLOG) 100 UNIT/ML injection Inject 2-8 Units into the skin See admin instructions. Sliding scale.over 250--2 units over 300--4 units over 350--6 units over 400--8 units Up to 50 units daily: For diabetes management (Patient taking differently: Inject 2-8 Units into the skin See admin instructions. Take 2-8 units as needed  Sliding scale.over 250--2 units over 300--4 units over 350--6 units over 400--8 units Up to 50 units daily: For diabetes management) 10 mL 0   insulin glargine (LANTUS) 100 UNIT/ML injection Inject 30 Units into the skin daily.      Iron, Ferrous Sulfate, 325 (65 Fe) MG TABS Take 325 mg by mouth daily. 90 tablet 2   JANUVIA 100 MG tablet Take 100 mg by mouth daily.     metFORMIN (GLUCOPHAGE) 1000 MG tablet Take 1,000 mg by mouth 2 (two) times daily.     risperiDONE (RISPERDAL) 2 MG tablet Take 1 tablet (2 mg  total) by mouth at bedtime. 30 tablet 2   sertraline (ZOLOFT) 100 MG tablet Take by mouth.     sertraline (ZOLOFT) 100 MG tablet Take 100 mg by mouth daily.     Sofosbuvir-Velpatasvir (EPCLUSA) 400-100 MG TABS Take 1 tablet by mouth daily. 28 tablet 2   traZODone (DESYREL) 50 MG tablet Take 2 tablets (100 mg total) by mouth at bedtime as needed for sleep. 30 tablet 2   No current facility-administered medications for this visit.   Facility-Administered Medications Ordered in Other Visits  Medication Dose Route Frequency Provider Last Rate Last Admin   cloNIDine (CATAPRES) tablet 0.2 mg  0.2 mg Oral Once Faythe Ghee, PA-C        REVIEW OF SYSTEMS:  Constitutional: ( - ) fevers, ( - )  chills , ( - ) night sweats Eyes: ( - ) blurriness of vision, ( - ) double vision, ( - ) watery eyes Ears, nose, mouth, throat, and face: ( - ) mucositis, ( - ) sore throat Respiratory: ( - ) cough, ( - ) dyspnea, ( - ) wheezes Cardiovascular: ( - ) palpitation, ( - ) chest discomfort, ( - ) lower extremity swelling Gastrointestinal:  ( - ) nausea, ( - ) heartburn, ( - ) change in bowel habits Skin: ( - ) abnormal skin rashes Lymphatics: ( - ) new lymphadenopathy, ( - ) easy bruising Neurological: ( - ) numbness, ( - ) tingling, ( - ) new weaknesses Behavioral/Psych: ( - ) mood change, ( - ) new changes  All other systems were reviewed with the patient and are negative.  PHYSICAL EXAMINATION: ECOG PERFORMANCE STATUS: 1 - Symptomatic but completely ambulatory  Vitals:   07/16/21 1054  BP: 98/60  Pulse: (!) 55  Resp: 18  Temp: 98.3 F (36.8 C)  SpO2: 100%   Filed Weights   07/16/21 1054  Weight: 194 lb 1.6 oz (88 kg)    GENERAL: well appearing middle aged Caucasian alert, no distress and comfortable SKIN: skin color, texture, turgor are normal, no rashes or significant lesions EYES: conjunctiva are pink and non-injected, sclera clear OROPHARYNX: no exudate, no erythema; lips, buccal  mucosa, and tongue normal. Mild oozing of blood from right lower molar site.  LUNGS: clear to auscultation and percussion with normal breathing effort HEART: regular rate & rhythm and no murmurs and no lower extremity edema Musculoskeletal: no cyanosis of digits and no clubbing  PSYCH: alert & oriented x 3, fluent speech NEURO: no focal motor/sensory deficits  LABORATORY DATA:  I have reviewed the data as listed CBC Latest Ref Rng & Units 07/16/2021 06/14/2021 01/08/2021  WBC 4.0 - 10.5 K/uL 2.3(L) 4.6 2.0(L)  Hemoglobin 13.0 - 17.0 g/dL 0.3(E) 11.7(L) 11.5(L)  Hematocrit 39.0 - 52.0 % 27.3(L) 34.4(L) 33.6(L)  Platelets 150 - 400 K/uL 40(L) 55(L) 50(L)    CMP Latest Ref Rng & Units 07/16/2021 06/14/2021 01/08/2021  Glucose 70 - 99 mg/dL 092(Z) 300(T) 622(Q)  BUN 6 - 20 mg/dL 18 33(H) 22  Creatinine 0.61 - 1.24 mg/dL 5.45(G) 2.56(L) 8.93(T)  Sodium 135 - 145 mmol/L 137 132(L) 136  Potassium 3.5 - 5.1 mmol/L 4.6 4.4 4.4  Chloride 98 - 111 mmol/L 104 98 100  CO2 22 - 32 mmol/L 24 25 25   Calcium 8.9 - 10.3 mg/dL 9.2 9.3 9.4  Total Protein 6.5 - 8.1 g/dL 7.1 - 7.3  Total Bilirubin 0.3 - 1.2 mg/dL 0.3 - 0.4  Alkaline Phos 38 - 126 U/L 105 - -  AST 15 - 41 U/L 16 - 14  ALT 0 - 44 U/L 15 - 9    RADIOGRAPHIC STUDIES: No results found.  ASSESSMENT & PLAN SWAN ZAYED 55 y.o. male with medical history significant for cirrhosis 2/2 to Hep C, DM type II, and polysubstance abuse who presents for follow up to evaluate of longstanding pancytopenia.    Pancytopenia in the setting of cirrhosis is common, particularly when splenomegaly is present as well ( Can J Gastroenterol. 2009;23(6):441-445.). This patient has marked splenomegaly, with a spleen measuring 21.4 cm and containing 1L of volume. In this case the body is sequestering cells of all types in the spleen, causing the apparent circulating value to appear quite low. Additionally the liver  produces thrombopoietin, which stimulates the marrow to  produce platelets. When the liver functions is impaired thrombopoietin decreases, resulting in decreased platelets levels. Unfortunately there is no definitive treatment for splenomegaly causes by cirrhosis (due to increased portal pressure). TIPS and beta blockers to decrease portal pressure have been shown to help in some cases, however cytopenias alone are not an indication for the procedure.   During his last visit the patient had an extensive workup including an attritional evaluation, review the peripheral blood film, and viral work-up revealed no other possible etiologies for the patient's pancytopenia.  In the interim since her last visit the patient has been treated by GI for his hepatitis C.    In the event the patient was in a car procedure is having issues with bleeding we would happily see him back for the consideration of TPO mimetic therapy.  #Pancytopenia in the Setting of Cirrhosis -- counts appear to be stable, with a drop in Hgb since last visit to 9.2.   --HIV negative, known Hep C positive with cirrhosis -- prior review of peripheral blood film shows markedly decrease WBC and Plt, but normal morphology and no clumping.  --the patient's pancytopenia is most consistent with massive splenomegaly 2/2 to cirrhosis. Splenomegaly (substantial, noted at 1L of volume (nml 250cc) and 21.4 cm)) in Feb 2020.  -- in the event his Plt count were to drop <50 and he were to require a procedure we can potentially treat with avatrombopag to help elevate these levels.  --RTC PRN  #Normocytic Anemia --will order labs to include nutritional studies and inflammatory markers.  --RTC if concerning findings noted in the bloodwork.  #Cirrhosis 2/2 to Hepatitis C --patient of GI at [facility]  --patient has completed Hep C antiviral treatment --defer management of cirrhosis and Hep C GI center   #IV Drug Use #Mental Health --currently using, d/c suboxone clinic --encourage patient to try to  get clean again.  --continue to monitor  Orders Placed This Encounter  Procedures   DG Ankle 2 Views Left    Standing Status:   Future    Standing Expiration Date:   07/16/2022    Order Specific Question:   Reason for Exam (SYMPTOM  OR DIAGNOSIS REQUIRED)    Answer:   ankle pain, persistent after fall    Order Specific Question:   Preferred imaging location?    Answer:   [facility]   Ferritin    Standing Status:   Future    Standing Expiration Date:   07/16/2022   Iron and TIBC    Standing Status:   Future    Standing Expiration Date:   07/16/2022   Retic Panel    Standing Status:   Future    Standing Expiration Date:   07/16/2022   Lactate dehydrogenase (LDH)    Standing Status:   Future    Standing Expiration Date:   07/16/2022   Sedimentation rate    Standing Status:   Future    Standing Expiration Date:   07/16/2022   C-reactive protein    Standing Status:   Future    Standing Expiration Date:   07/16/2022   Folate, Serum    Standing Status:   Future    Standing Expiration Date:   07/16/2022   Vitamin B12    Standing Status:   Future    Standing Expiration Date:   07/16/2022    All questions were answered. The patient knows to call the clinic with any  problems, questions or concerns.  A total of more than 30 minutes were spent on this encounter and over half of that time was spent on counseling and coordination of care as outlined above.   Ulysees Barns, MD Department of Hematology/Oncology [facility] at [facility] Phone: [phone] Pager: [phone] Email: [email or web address]  07/23/2021 2:23 PM

## 2021-07-19 ENCOUNTER — Ambulatory Visit (HOSPITAL_COMMUNITY): Payer: Medicaid Other

## 2021-07-23 ENCOUNTER — Other Ambulatory Visit: Payer: Self-pay

## 2021-07-23 ENCOUNTER — Emergency Department
Admission: EM | Admit: 2021-07-23 | Discharge: 2021-07-23 | Disposition: A | Discharge status: Home / Self Care | Payer: Medicaid Other | Attending: Emergency Medicine | Admitting: Emergency Medicine

## 2021-07-23 DIAGNOSIS — F1193 Opioid use, unspecified with withdrawal: Secondary | ICD-10-CM

## 2021-07-23 DIAGNOSIS — F1123 Opioid dependence with withdrawal: Secondary | ICD-10-CM | POA: Diagnosis not present

## 2021-07-23 DIAGNOSIS — I10 Essential (primary) hypertension: Secondary | ICD-10-CM

## 2021-07-23 DIAGNOSIS — E119 Type 2 diabetes mellitus without complications: Secondary | ICD-10-CM | POA: Insufficient documentation

## 2021-07-23 DIAGNOSIS — F17210 Nicotine dependence, cigarettes, uncomplicated: Secondary | ICD-10-CM | POA: Insufficient documentation

## 2021-07-23 DIAGNOSIS — Z794 Long term (current) use of insulin: Secondary | ICD-10-CM | POA: Diagnosis not present

## 2021-07-23 LAB — CBG MONITORING, ED: Glucose-Capillary: 174 mg/dL — ABNORMAL HIGH (ref 70–99)

## 2021-07-23 MED ORDER — CLONIDINE HCL 0.1 MG PO TABS
0.2000 mg | ORAL_TABLET | Freq: Once | ORAL | Status: AC
  Administered 2021-07-23: 0.2 mg via ORAL
  Filled 2021-07-23: qty 2

## 2021-07-23 NOTE — ED Triage Notes (Addendum)
Pt in from detox facility due to HTN; 215/120BP this morning per pt. States this happens when he goes into withdrawal. N/V; aches; sweats; anxiety; shakes; cramping; chills per pt. States facility sent him here requesting he get his BP under control before coming back to them.

## 2021-07-23 NOTE — ED Provider Notes (Signed)
[facility] Emergency Department Provider Note   ____________________________________________   I have reviewed the triage vital signs and the nursing notes.   HISTORY  Chief Complaint Hypertension   History limited by: Not Limited   HPI Chase Reed is a 55 y.o. male who presents to the emergency department today because of elevated blood pressure. Patient is currently withdrawing from opioids. States that he gets high blood pressure when he detoxes from opioid. Was sent from [facility]. Patient also complaining of pain everywhere and nausea.     Records reviewed. Per medical record review patient has a history of DM, opioid use disorder.   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   Cirrhosis (HCC) 12/24/2018   Substance induced mood disorder (HCC) 12/21/2016   Opioid use disorder, severe, dependence (HCC) 05/02/2016   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   Elevated lipase 05/02/2016    Past Surgical History:  Procedure Laterality Date   APPLICATION OF WOUND VAC N/A 12/24/2018   Procedure: APPLICATION OF WOUND VAC;  Surgeon: Alleen Borne, MD;  Location: [facility] OR;  Service: Thoracic;  Laterality: N/A;   APPLICATION OF WOUND VAC N/A 12/27/2018   Procedure: WOUND VAC CHANGE;  Surgeon: Alleen Borne, MD;  Location: [facility];  Service: Thoracic;  Laterality: N/A;   APPLICATION OF WOUND  VAC N/A 01/14/2019   Procedure: APPLICATION OF WOUND VAC;  Surgeon: Alleen Borne, MD;  Location: [facility];  Service: Vascular;  Laterality: N/A;   I & D EXTREMITY N/A 01/14/2019   Procedure: DEBRIDEMENT OF STERNOCLAVICULAR JOINT;  Surgeon: Alleen Borne, MD;  Location: [facility];  Service: Vascular;  Laterality: N/A;   I & D EXTREMITY Right 10/03/2020   Procedure: Irrigation debridement of RIGHT forearm subfascial abscess measuring approximately 6 x 3 x 1.5 cm;  Surgeon: Ernest Mallick, MD;  Location: [facility];  Service: Orthopedics;  Laterality: Right;   LITHOTRIPSY     STERNAL WOUND DEBRIDEMENT N/A 12/24/2018   Procedure: I&D CHEST WALL ABSCESS;  Surgeon: Alleen Borne, MD;  [facility]: [facility];  Service: Thoracic;  Laterality: N/A;   STERNAL WOUND DEBRIDEMENT N/A 12/27/2018   Procedure: DEBRIDEMENT OF CHEST WALL WOUND;  Surgeon: Alleen Borne, MD;  [facility]: [facility];  Service: Thoracic;  Laterality: N/A;   TEE WITHOUT CARDIOVERSION N/A 01/03/2019   Procedure: TRANSESOPHAGEAL ECHOCARDIOGRAM (TEE);  Surgeon: Thurmon Fair, MD;  Location: [facility] ENDOSCOPY;  Service: Cardiovascular;  Laterality: N/A;    Prior to Admission medications   Medication Sig Start Date End Date Taking? Authorizing Provider  atomoxetine (STRATTERA) 80 MG capsule Take 1 capsule (80 mg total) by mouth daily. 06/04/21   Shanna Cisco, NP  bacitracin-neomycin-polymyxin-hydrocortisone (CORTISPORIN) 1 % ointment Apply 1 application topically 2 (two) times daily. 01/25/21   Gardiner Barefoot, MD  COVID-19 mRNA vaccine, Moderna, 100 MCG/0.5ML injection INJECT AS DIRECTED 12/10/20 12/10/21  Judyann Munson, MD  DULoxetine (CYMBALTA) 60 MG  capsule Take 60 mg by mouth daily. 05/07/21   [provider]  gabapentin (NEURONTIN) 600 MG tablet Take 600 mg by mouth in the morning, at noon, in the evening, and at bedtime.  01/20/19   [provider]  insulin aspart (NOVOLOG) 100 UNIT/ML injection Inject 2-8 Units into the skin See  admin instructions. Sliding scale.over 250--2 units over 300--4 units over 350--6 units over 400--8 units Up to 50 units daily: For diabetes management Patient taking differently: Inject 2-8 Units into the skin See admin instructions. Take 2-8 units as needed  Sliding scale.over 250--2 units over 300--4 units over 350--6 units over 400--8 units Up to 50 units daily: For diabetes management 12/25/16   Armandina Stammer I, NP  insulin glargine (LANTUS) 100 UNIT/ML injection Inject 30 Units into the skin daily.     [provider]  Iron, Ferrous Sulfate, 325 (65 Fe) MG TABS Take 325 mg by mouth daily. 09/23/19   Jaci Standard, MD  JANUVIA 100 MG tablet Take 100 mg by mouth daily. 05/07/21   [provider]  metFORMIN (GLUCOPHAGE) 1000 MG tablet Take 1,000 mg by mouth 2 (two) times daily. 05/07/21   [provider]  risperiDONE (RISPERDAL) 2 MG tablet Take 1 tablet (2 mg total) by mouth at bedtime. 06/04/21   Shanna Cisco, NP  sertraline (ZOLOFT) 100 MG tablet Take by mouth. 12/18/20   [provider]  sertraline (ZOLOFT) 100 MG tablet Take 100 mg by mouth daily. 03/11/21   [provider]  Sofosbuvir-Velpatasvir (EPCLUSA) 400-100 MG TABS Take 1 tablet by mouth daily. 02/25/21   Kuppelweiser, Cassie L, RPH-CPP  traZODone (DESYREL) 50 MG tablet Take 2 tablets (100 mg total) by mouth at bedtime as needed for sleep. 07/25/20   Shanna Cisco, NP    Allergies Atorvastatin  Family History  Problem Relation Age of Onset   COPD Father     Social History Social History   Tobacco Use   Smoking status: Some Days    Packs/day: 0.25    Years: 30.00    Pack years: 7.50    Types: Cigarettes   Smokeless tobacco: Former  Building services engineer Use: Never used  Substance Use Topics   Alcohol use: No   Drug use: Yes    Types: Heroin, IV    Comment: Fentanyl and "Oxy" per patient     Review of Systems Constitutional: No fever/chills Eyes: No visual  changes. ENT: No sore throat. Cardiovascular: Denies chest pain. Respiratory: Denies shortness of breath. Gastrointestinal: No abdominal pain.  Positive for nausea.  Genitourinary: Negative for dysuria. Musculoskeletal: Positive for diffuse pain.  Skin: Negative for rash. Neurological: Negative for headaches, focal weakness or numbness.  ____________________________________________   PHYSICAL EXAM:  VITAL SIGNS: ED Triage Vitals  Enc Vitals Group     BP 07/23/21 1408 (!) 214/122     Pulse Rate 07/23/21 1408 88     Resp 07/23/21 1408 19     Temp 07/23/21 1408 98.4 F (36.9 C)     Temp Source 07/23/21 1408 Oral     SpO2 07/23/21 1408 100 %     Weight 07/23/21 1410 190 lb (86.2 kg)     Height 07/23/21 1410 6' (1.829 m)     Head Circumference --      Peak Flow --      Pain Score 07/23/21 1409 7    Constitutional: Alert and oriented.  Eyes: Conjunctivae are normal.  ENT  Head: Normocephalic and atraumatic.      Nose: No congestion/rhinnorhea.      Mouth/Throat: Mucous membranes are moist.      Neck: No stridor. Hematological/Lymphatic/Immunilogical: No cervical lymphadenopathy. Cardiovascular: Normal rate, regular rhythm.  Respiratory: Normal respiratory effort without tachypnea nor retractions.  Gastrointestinal: Soft and non tender.  Genitourinary: Deferred Musculoskeletal: Normal range of motion in all extremities.  Neurologic:  Normal speech and language. No gross focal neurologic deficits are appreciated.  Skin:  Skin is warm, dry and intact. No rash noted. Psychiatric: Mood and affect are normal. Speech and behavior are normal. Patient exhibits appropriate insight and judgment.  ____________________________________________    LABS (pertinent positives/negatives)  CBG 174  ____________________________________________   EKG  None  ____________________________________________     RADIOLOGY  None  ____________________________________________   PROCEDURES  Procedures  ____________________________________________   INITIAL IMPRESSION / ASSESSMENT AND PLAN / ED COURSE  Pertinent labs & imaging results that were available during my care of the patient were reviewed by me and considered in my medical decision making (see chart for details).   Patient presents to the emergency department today because of concern for elevated blood pressure in the setting of opioid withdrawal. Patient was given clonidine here and blood pressure did start to improve. Do have some question about some elevation being due to patient discomfort, he moans in pain with blood pressure reading, which is likely due to opioid withdrawal. Patient can continue clonidine at [facility]. Blood sugar while slightly high not at a concerning level. Will discharge back to [facility].   ___________________________________________   FINAL CLINICAL IMPRESSION(S) / ED DIAGNOSES  Final diagnoses:  Hypertension, unspecified type  Opioid withdrawal (HCC)     Note: This dictation was prepared with Dragon dictation. Any transcriptional errors that result from this process are unintentional     Phineas Semen, MD 07/23/21 [phone]

## 2021-07-23 NOTE — ED Notes (Signed)
[facility] called and spoke with EDP. Explained that pt was appropriate for discharge.

## 2021-07-23 NOTE — ED Notes (Addendum)
[facility] called to inform of patient discharge. Requesting most recent VS. Informed that pt refused BP recheck due to pain. Molly Maduro states "the Interior and spatial designer will call back". Offered Molly Maduro to speak with EDP doctor, declined.

## 2021-07-23 NOTE — ED Notes (Addendum)
Reports he was sent to ED for medical clearance from [facility]. Pt wants detox from opiods- reports 0.5-1mg  of fentanyl/morphine daily. Last use yesterday. Denies ETOH use. Reports feeling similar during last detox. Denies SI/HI/AVH

## 2021-07-23 NOTE — ED Notes (Signed)
Pt called [facility] to come and get his as he is up for discharge.

## 2021-07-23 NOTE — Discharge Instructions (Addendum)
At this time I do think it is reasonable and safe for you to continue detox. In my opinion you are medically cleared to resume detox at [facility]. Please have [facility] continue with the clonidine that they are treating you with. Please seek medical attention for any high fevers, chest pain, shortness of breath, change in behavior, persistent vomiting, bloody stool or any other new or concerning symptoms.

## 2021-07-23 NOTE — ED Notes (Signed)
Verified correct patient and correct discharge papers given. Pt alert and oriented X 4, stable for discharge. RR even and unlabored, color WNL. Discussed discharge instructions and follow-up as directed. Discharge medications discussed, when prescribed. Pt had opportunity to ask questions, and RN available to provide patient and/or family education.   

## 2021-07-23 NOTE — ED Notes (Signed)
Refused repeat BP check, ripped cuff off. EDP aware and indicates no further BP check needed.

## 2021-07-23 NOTE — ED Provider Notes (Signed)
Emergency Medicine Provider Triage Evaluation Note  Chase Reed , a 55 y.o. male  was evaluated in triage.  Pt complains of elevated blood pressure and opioid withdrawal..  Review of Systems  Positive: Nausea Negative: Fever/chills  Physical Exam  BP (!) 214/122 (BP Location: Left Arm) Comment: pt calls out and tenses up arm when BP cuff inflates stating that it hurts  Pulse 88   Temp 98.4 F (36.9 C) (Oral)   Resp 19   Ht 6' (1.829 m)   Wt 86.2 kg   SpO2 100%   BMI 25.77 kg/m  Gen:   Awake, no distress and withdrawal Resp:  Normal effort  MSK:   Moves extremities without difficulty  Other:    Medical Decision Making  Medically screening exam initiated at 2:16 PM.  Appropriate orders placed.  Chase Reed was informed that the remainder of the evaluation will be completed by another provider, this initial triage assessment does not replace that evaluation, and the importance of remaining in the ED until their evaluation is complete.  Patient's blood pressure is grossly elevated at 214/122   Faythe Ghee, PA-C 07/29/21 1049    Shaune Pollack, MD 07/30/21 [phone]

## 2021-07-23 NOTE — ED Notes (Addendum)
Explained to patient that [facility] is requiring a repeat BP. Repeat BP 204/119. EDP informed. No further treatment orders given. EDP states to proceed with discharge.

## 2021-07-29 ENCOUNTER — Telehealth (INDEPENDENT_AMBULATORY_CARE_PROVIDER_SITE_OTHER): Payer: Medicaid Other | Admitting: Psychiatry

## 2021-07-29 ENCOUNTER — Encounter (HOSPITAL_COMMUNITY): Payer: Self-pay | Admitting: Psychiatry

## 2021-07-29 ENCOUNTER — Other Ambulatory Visit: Payer: Self-pay

## 2021-07-29 DIAGNOSIS — F333 Major depressive disorder, recurrent, severe with psychotic symptoms: Secondary | ICD-10-CM | POA: Diagnosis not present

## 2021-07-29 DIAGNOSIS — R41840 Attention and concentration deficit: Secondary | ICD-10-CM | POA: Diagnosis not present

## 2021-07-29 MED ORDER — TRAZODONE HCL 50 MG PO TABS: 100.0000 mg | ORAL_TABLET | Freq: Every evening | ORAL | 3 refills | Status: DC | PRN

## 2021-07-29 MED ORDER — DULOXETINE HCL 60 MG PO CPEP: 60.0000 mg | ORAL_CAPSULE | Freq: Every day | ORAL | 3 refills | Status: DC

## 2021-07-29 MED ORDER — ATOMOXETINE HCL 80 MG PO CAPS: 80.0000 mg | ORAL_CAPSULE | Freq: Every day | ORAL | 3 refills | Status: DC

## 2021-07-29 MED ORDER — RISPERIDONE 2 MG PO TABS: 2.0000 mg | ORAL_TABLET | Freq: Every day | ORAL | 3 refills | Status: DC

## 2021-07-29 NOTE — Progress Notes (Signed)
[facility] MD/PA/NP OP Progress Note Virtual Visit via Telephone Note  I connected with Chase Reed on 07/29/21 at  4:00 PM EDT by telephone and verified that I am speaking with the correct person using two identifiers.  Location: Patient: home Provider: Clinic   I discussed the limitations, risks, security and privacy concerns of performing an evaluation and management service by telephone and the availability of in person appointments. I also discussed with the patient that there may be a patient responsible charge related to this service. The patient expressed understanding and agreed to proceed.   I provided 30 minutes of non-face-to-face time during this encounter.    07/29/2021 4:43 PM Chase Reed  MRN:  [id number] [facility] going home Chief Complaint: " Things are kind of rough my friend died"  HPI: 55 year old male in today for follow up psychiatric evaluation.    He has a psychiatric history of substance-induced mood disorder, substance induced psychotic disorders with hallucinations, opioid use disorder, and depression. He is currently managed on duloxetine 60 mg daily, trazodone 50 mg, and gabapentin 600 mg three times daily (precribed by PCP for neuropathy), Strattera 80 mg daily, and Risperdal 2 mg nightly at bedtime.  He notes that his PCP filled Zoloft 100 mg a month ago.  Patient notes that his medications are effective in managing his psychiatric conditions.    Today patient ws unable to login virtually so his assessment was done over the phone. During exam he was pleasant, cooperative, and engaged in conversation. He notes that thing have been rough since his friend died a few months ago. He informed Clinical research associate that he lived with his friend and since his death he relapsed. He notes that he was discharged from [facility] today and will go to an [facility] soon. He reports that he has been sober for 7 days and currently lives with his parents. He notes that he also has health  comorbidity's that are worsening his current mental health state. He notes that he has been suffering from hypo/hypertension, diabetes, has had recent falls, and possible blockages in his heart. He notes that he is followed by a PCP to help manage these conditions. Patient also informed writer that for the last moth her has not had hallucinations but notes at times he is paranoid. He informed Clinical research associate that since his last visit his mood has been more stable and denies symptoms of mania.   Patient notes that the death of his friend and the above worsen his anxiety and depression although he notes that both these conditions have somewhat improved.   Provider conducted a GAD 7 and patient scored a 13. Provider also conducted a PHQ 9 and patient scored a 20. He notes his sleep is disturbed as he wakes up though the night to urinate (noting its a side effect of his diabetes).   Patient notes he no longer misuses Xanax. He informed Clinical research associate that he occassionally uses opioids to help manage his chronic pain and neuropathy. Patient notes that he is looking for a sponsor to help manage his sobriety.   No medication changed made today. Patient agreeable to continue medications as prescribed. Patient was prescribed Zoloft by his PCP. Provider informed patient that Zoloft can cause serotonin syndrome while on Cymbalta and Trazodone. He notes he will fu with his PCP to DC the med. No other concerns noted at this time.    Visit Diagnosis:    ICD-10-CM   1. Severe episode of recurrent major depressive  disorder, with psychotic features (HCC)  F33.3 risperiDONE (RISPERDAL) 2 MG tablet    DULoxetine (CYMBALTA) 60 MG capsule    traZODone (DESYREL) 50 MG tablet    2. Attention and concentration deficit  R41.840 atomoxetine (STRATTERA) 80 MG capsule      Past Psychiatric History:  He has a psychiatric history of substance-induced mood disorder, substance induced psychotic disorders with hallucinations, opioid use disorder,  and depression.  Past Medical History:  Past Medical History:  Diagnosis Date   Cirrhosis (HCC)    Diabetes mellitus without complication (HCC)    Hepatitis     Past Surgical History:  Procedure Laterality Date   APPLICATION OF WOUND VAC N/A 12/24/2018   Procedure: APPLICATION OF WOUND VAC;  Surgeon: Alleen Borne, MD;  Location: [facility] OR;  Service: Thoracic;  Laterality: N/A;   APPLICATION OF WOUND VAC N/A 12/27/2018   Procedure: WOUND VAC CHANGE;  Surgeon: Alleen Borne, MD;  Location: [facility] OR;  Service: Thoracic;  Laterality: N/A;   APPLICATION OF WOUND VAC N/A 01/14/2019   Procedure: APPLICATION OF WOUND VAC;  Surgeon: Alleen Borne, MD;  Location: [facility] OR;  Service: Vascular;  Laterality: N/A;   I & D EXTREMITY N/A 01/14/2019   Procedure: DEBRIDEMENT OF STERNOCLAVICULAR JOINT;  Surgeon: Alleen Borne, MD;  Location: [facility] OR;  Service: Vascular;  Laterality: N/A;   I & D EXTREMITY Right 10/03/2020   Procedure: Irrigation debridement of RIGHT forearm subfascial abscess measuring approximately 6 x 3 x 1.5 cm;  Surgeon: Ernest Mallick, MD;  Location: [facility] OR;  Service: Orthopedics;  Laterality: Right;   LITHOTRIPSY     STERNAL WOUND DEBRIDEMENT N/A 12/24/2018   Procedure: I&D CHEST WALL ABSCESS;  Surgeon: Alleen Borne, MD;  Location: [facility];  Service: Thoracic;  Laterality: N/A;   STERNAL WOUND DEBRIDEMENT N/A 12/27/2018   Procedure: DEBRIDEMENT OF CHEST WALL WOUND;  Surgeon: Alleen Borne, MD;  Location: [facility];  Service: Thoracic;  Laterality: N/A;   TEE WITHOUT CARDIOVERSION N/A 01/03/2019   Procedure: TRANSESOPHAGEAL ECHOCARDIOGRAM (TEE);  Surgeon: Thurmon Fair, MD;  Location: [facility] ENDOSCOPY;  Service: Cardiovascular;  Laterality: N/A;    Family Psychiatric History: Unknown  Family History:  Family History  Problem Relation Age of Onset   COPD Father     Social History:  Social History   Socioeconomic History   Marital status: Married    Spouse name: Not on file    Number of children: Not on file   Years of education: Not on file   Highest education level: Not on file  Occupational History   Occupation: Building Education administrator  Tobacco Use   Smoking status: Some Days    Packs/day: 0.25    Years: 30.00    Pack years: 7.50    Types: Cigarettes   Smokeless tobacco: Former  Building services engineer Use: Never used  Substance and Sexual Activity   Alcohol use: No   Drug use: Yes    Types: Heroin, IV    Comment: Fentanyl and "Oxy" per patient    Sexual activity: Never  Other Topics Concern   Not on file  Social History Narrative   Not on file   Social Determinants of Health   Financial Resource Strain: Not on file  Food Insecurity: Not on file  Transportation Needs: Not on file  Physical Activity: Not on file  Stress: Not on file  Social Connections: Not on file    Allergies:  Allergies  Allergen Reactions   Atorvastatin Other (See Comments)    Leg cramps and body aches    Metabolic Disorder Labs: Lab Results  Component Value Date   HGBA1C 7.7 (H) 10/04/2020   MPG 174.29 10/04/2020   MPG 168.55 12/24/2018   No results found for: PROLACTIN No results found for: CHOL, TRIG, HDL, CHOLHDL, VLDL, LDLCALC Lab Results  Component Value Date   TSH 1.957 09/20/2019    Therapeutic Level Labs: No results found for: LITHIUM No results found for: VALPROATE No components found for:  CBMZ  Current Medications: Current Outpatient Medications  Medication Sig Dispense Refill   atomoxetine (STRATTERA) 80 MG capsule Take 1 capsule (80 mg total) by mouth daily. 30 capsule 3   bacitracin-neomycin-polymyxin-hydrocortisone (CORTISPORIN) 1 % ointment Apply 1 application topically 2 (two) times daily. 15 g 1   COVID-19 mRNA vaccine, Moderna, 100 MCG/0.5ML injection INJECT AS DIRECTED .25 mL 0   DULoxetine (CYMBALTA) 60 MG capsule Take 1 capsule (60 mg total) by mouth daily. 30 capsule 3   gabapentin (NEURONTIN) 600 MG tablet Take 600 mg by mouth in the  morning, at noon, in the evening, and at bedtime.      insulin aspart (NOVOLOG) 100 UNIT/ML injection Inject 2-8 Units into the skin See admin instructions. Sliding scale.over 250--2 units over 300--4 units over 350--6 units over 400--8 units Up to 50 units daily: For diabetes management (Patient taking differently: Inject 2-8 Units into the skin See admin instructions. Take 2-8 units as needed  Sliding scale.over 250--2 units over 300--4 units over 350--6 units over 400--8 units Up to 50 units daily: For diabetes management) 10 mL 0   insulin glargine (LANTUS) 100 UNIT/ML injection Inject 30 Units into the skin daily.      Iron, Ferrous Sulfate, 325 (65 Fe) MG TABS Take 325 mg by mouth daily. 90 tablet 2   JANUVIA 100 MG tablet Take 100 mg by mouth daily.     metFORMIN (GLUCOPHAGE) 1000 MG tablet Take 1,000 mg by mouth 2 (two) times daily.     risperiDONE (RISPERDAL) 2 MG tablet Take 1 tablet (2 mg total) by mouth at bedtime. 30 tablet 3   sertraline (ZOLOFT) 100 MG tablet Take 100 mg by mouth daily.     Sofosbuvir-Velpatasvir (EPCLUSA) 400-100 MG TABS Take 1 tablet by mouth daily. 28 tablet 2   traZODone (DESYREL) 50 MG tablet Take 2 tablets (100 mg total) by mouth at bedtime as needed for sleep. 30 tablet 3   No current facility-administered medications for this visit.     Musculoskeletal: Strength & Muscle Tone:  Unable to assess. Patient unable to log in virtually Gait & Station:  Unable to assess. Patient unable to log in virtually Patient leans: N/A  Psychiatric Specialty Exam: Review of Systems  There were no vitals taken for this visit.There is no height or weight on file to calculate BMI.  General Appearance:  Unable to assess. Patient unable to log in virtually  Eye Contact:   Unable to assess. Patient unable to log in virtually  Speech:  Clear and Coherent and Normal Rate  Volume:  Normal  Mood:  Anxious and Depressed  Affect:  Congruent  Thought Process:  Coherent,  Goal Directed and Linear  Orientation:  Full (Time, Place, and Person)  Thought Content: WDL and Logical   Suicidal Thoughts:  Yes.  without intent/plan  Homicidal Thoughts:  No  Memory:  Immediate;   Good Recent;   Good Remote;   Good  Judgement:  Good  Insight:  Good and Fair  Psychomotor Activity:  Normal  Concentration:  Concentration: Good and Attention Span: Good  Recall:  Good  Fund of Knowledge: Good  Language: Good  Akathisia:  No  Handed:  Right  AIMS (if indicated):Not done  Assets:  Communication Skills Desire for Improvement Housing Social Support  ADL's:  Intact  Cognition: WNL  Sleep:  Good   Screenings: AIMS    Flowsheet Row [facility] ([facility] Admission (Discharged) [facility]  AIMS Total Score 0 0      AUDIT    Flowsheet Row Admission (Discharged[facility]  Alcohol Use Disorder Identification Test Final Score (AUDIT) 0      GAD-7    Flowsheet Row Video Visit from 07/29/2021 in [facility]or from 05/03/2020 in [facility]  Total GAD-7 Score 13 14      PHQ2-9    Flowsheet Row Video Visit from 07/29/2021 in [facility] Counselor from 05/03/2020 in [facility] Office Visit from 09/21/2019 in [facility] Office Visit from 02/03/2019 in [facility]  PHQ-2 Total Score 6 4 0 6  PHQ-9 Total Score 20 16 -- --      Flowsheet Row Video Visit from 07/29/2021 in [facility] ED from 07/23/2021 in [facility] from 06/14/2021 in [facility]  C-SSRS RISK  CATEGORY Error: Q7 should not be populated when Q6 is No No Risk No Risk        Assessment and Plan: Patient notes that his anxiety and depression has improved since his last visit. He notes that at times he is down due to the loss of his friend but informed Clinical research associate that he is able to cope with it. No medication changed made today. Patient agreeable to continue medications as prescribed. Patient was prescribed Zoloft by his PCP. Provider informed patient that Zoloft can cause serotonin syndrome while on Cymbalta and Trazodone. He notes he will fu with his PCP to DC the med.  1. Severe episode of recurrent major depressive disorder, with psychotic features (HCC)  Continue- risperiDONE (RISPERDAL) 2 MG tablet; Take 1 tablet (2 mg total) by mouth at bedtime.  Dispense: 30 tablet; Refill: 3 Continue- traZODone (DESYREL) 50 MG tablet; Take 2 tablets (100 mg total) by mouth at bedtime as needed for sleep.  Dispense: 30 tablet; Refill: 3 Continue- DULoxetine (CYMBALTA) 60 MG capsule; Take 1 capsule (60 mg total) by mouth daily.  Dispense: 30 capsule; Refill: 3  2. Attention and concentration deficit  Continue- atomoxetine (STRATTERA) 80 MG capsule; Take 1 capsule (80 mg total) by mouth daily.  Dispense: 30 capsule; Refill: 3     Follow-up in 3 months  Shanna Cisco, NP 07/29/2021, 4:43 PM

## 2021-07-31 ENCOUNTER — Telehealth (HOSPITAL_COMMUNITY): Payer: Self-pay | Admitting: Cardiology

## 2021-07-31 NOTE — Telephone Encounter (Signed)
Patient did not schedule echocardiogram for reason below: 07/19/21 we had to reschedule due to tech left sick. Patient was unable to reschedule due to he was going toa rehab clinic for detox for a few weeks and will call us back to reschedule/[facility] Order will be removed and when patient calls back to reschedule we will reinstate the order.

## 2021-08-12 ENCOUNTER — Inpatient Hospital Stay: Payer: Medicaid Other | Attending: Hematology and Oncology

## 2021-08-13 ENCOUNTER — Ambulatory Visit: Payer: Medicaid Other | Admitting: Internal Medicine

## 2021-09-10 ENCOUNTER — Ambulatory Visit (HOSPITAL_BASED_OUTPATIENT_CLINIC_OR_DEPARTMENT_OTHER): Payer: Medicaid Other | Admitting: Cardiology

## 2021-10-11 ENCOUNTER — Other Ambulatory Visit: Payer: Self-pay

## 2021-10-11 ENCOUNTER — Ambulatory Visit (INDEPENDENT_AMBULATORY_CARE_PROVIDER_SITE_OTHER): Payer: Medicaid Other | Admitting: Cardiology

## 2021-10-11 VITALS — BP 152/110 | HR 86 | Ht 72.0 in | Wt 200.2 lb

## 2021-10-11 DIAGNOSIS — E119 Type 2 diabetes mellitus without complications: Secondary | ICD-10-CM

## 2021-10-11 DIAGNOSIS — Z87898 Personal history of other specified conditions: Secondary | ICD-10-CM

## 2021-10-11 DIAGNOSIS — Z8679 Personal history of other diseases of the circulatory system: Secondary | ICD-10-CM | POA: Diagnosis not present

## 2021-10-11 DIAGNOSIS — K7469 Other cirrhosis of liver: Secondary | ICD-10-CM

## 2021-10-11 DIAGNOSIS — I10 Essential (primary) hypertension: Secondary | ICD-10-CM

## 2021-10-11 DIAGNOSIS — Z794 Long term (current) use of insulin: Secondary | ICD-10-CM

## 2021-10-11 NOTE — Progress Notes (Signed)
Cardiology Office Note:    Date:  10/11/2021   ID:  Chase Reed, DOB [birth year], MRN [id number]  PCP:  Reyes Ivan, PA-C  Cardiologist:  Buford Dresser, MD  Referring MD: Reyes Ivan,*   CC: follow-up for orthostasis/syncope  History of Present Illness:    Chase Reed is a 55 y.o. male with a hx of type II diabetes, chronic hepatitis C on epclusa with cirrhosis,  who is seen for follow-up. I initially met him 07/01/2021 as a new consult at the request of Reyes Ivan,* for the evaluation and management of syncope.  Today: On 07/23/2021 he presented to the ED with elevated blood pressure, myalgias, and nausea. His blood pressure was 214/122. At that time he was in opioid withdrawal. He reported that he becomes hypertensive when he detoxes from opioid.  Overall, he is feeling okay. He has been clean for 80 days, but has been suffering myalgias. Most of his body is sore with palpation. Lately he has been lethargic and fatigued most of the time. Since his last visit he denies any falls.  He has not been monitoring his blood pressure at home. If he develops chills, he knows this is either due to his blood sugar or blood pressure. His blood sugar has been elevated, even when not eating or just eating an apple. His baseline is 150, but his sugar is 350-400 every day. This has been occurring for the past couple weeks.  He endorses myalgias due to his neuropathy, but his LE edema has resolved at this time.  He states that he has almost quit smoking.  He denies any palpitations, chest pain, or shortness of breath. No lightheadedness, headaches, syncope, orthopnea, PND, or exertional symptoms.  Past Medical History:  Diagnosis Date   Cirrhosis ([place])    Diabetes mellitus without complication ([place])    Hepatitis     Past Surgical History:  Procedure Laterality Date   APPLICATION OF WOUND VAC N/A 12/24/2018   Procedure: APPLICATION OF WOUND VAC;   Surgeon: Gaye Pollack, MD;  Location: [facility];  Service: Thoracic;  Laterality: N/A;   APPLICATION OF WOUND VAC N/A 12/27/2018   Procedure: WOUND VAC CHANGE;  Surgeon: Gaye Pollack, MD;  Location: [facility];  Service: Thoracic;  Laterality: N/A;   APPLICATION OF WOUND VAC N/A 01/14/2019   Procedure: APPLICATION OF WOUND VAC;  Surgeon: Gaye Pollack, MD;  Location: [facility];  Service: Vascular;  Laterality: N/A;   I & D EXTREMITY N/A 01/14/2019   Procedure: DEBRIDEMENT OF STERNOCLAVICULAR JOINT;  Surgeon: Gaye Pollack, MD;  Location: [facility];  Service: Vascular;  Laterality: N/A;   I & D EXTREMITY Right 10/03/2020   Procedure: Irrigation debridement of RIGHT forearm subfascial abscess measuring approximately 6 x 3 x 1.5 cm;  Surgeon: Verner Mould, MD;  Location: [facility];  Service: Orthopedics;  Laterality: Right;   LITHOTRIPSY     STERNAL WOUND DEBRIDEMENT N/A 12/24/2018   Procedure: I&D CHEST WALL ABSCESS;  Surgeon: Gaye Pollack, MD;  Location: [facility];  Service: Thoracic;  Laterality: N/A;   STERNAL WOUND DEBRIDEMENT N/A 12/27/2018   Procedure: DEBRIDEMENT OF CHEST WALL WOUND;  Surgeon: Gaye Pollack, MD;  Location: [facility];  Service: Thoracic;  Laterality: N/A;   TEE WITHOUT CARDIOVERSION N/A 01/03/2019   Procedure: TRANSESOPHAGEAL ECHOCARDIOGRAM (TEE);  Surgeon: Sanda Klein, MD;  Location: [facility] ENDOSCOPY;  Service: Cardiovascular;  Laterality: N/A;    Current Medications: Current Outpatient  Medications on File Prior to Visit  Medication Sig   atomoxetine (STRATTERA) 80 MG capsule Take 1 capsule (80 mg total) by mouth daily.   bacitracin-neomycin-polymyxin-hydrocortisone (CORTISPORIN) 1 % ointment Apply 1 application topically 2 (two) times daily.   COVID-19 mRNA vaccine, Moderna, 100 MCG/0.5ML injection INJECT AS DIRECTED   DULoxetine (CYMBALTA) 60 MG capsule Take 1 capsule (60 mg total) by mouth daily.   gabapentin (NEURONTIN) 600 MG tablet Take 600 mg by mouth in the morning, at  noon, in the evening, and at bedtime.    insulin aspart (NOVOLOG) 100 UNIT/ML injection Inject 2-8 Units into the skin See admin instructions. Sliding scale.over 250--2 units over 300--4 units over 350--6 units over 400--8 units Up to 50 units daily: For diabetes management (Patient taking differently: Inject 2-8 Units into the skin See admin instructions. Take 2-8 units as needed  Sliding scale.over 250--2 units over 300--4 units over 350--6 units over 400--8 units Up to 50 units daily: For diabetes management)   insulin glargine (LANTUS) 100 UNIT/ML injection Inject 30 Units into the skin daily.    Iron, Ferrous Sulfate, 325 (65 Fe) MG TABS Take 325 mg by mouth daily.   JANUVIA 100 MG tablet Take 100 mg by mouth daily.   metFORMIN (GLUCOPHAGE) 1000 MG tablet Take 1,000 mg by mouth 2 (two) times daily.   risperiDONE (RISPERDAL) 2 MG tablet Take 1 tablet (2 mg total) by mouth at bedtime.   sertraline (ZOLOFT) 100 MG tablet Take 100 mg by mouth daily.   Sofosbuvir-Velpatasvir (EPCLUSA) 400-100 MG TABS Take 1 tablet by mouth daily.   traZODone (DESYREL) 50 MG tablet Take 2 tablets (100 mg total) by mouth at bedtime as needed for sleep.   No current facility-administered medications on file prior to visit.     Allergies:   Atorvastatin   Social History   Tobacco Use   Smoking status: Some Days    Packs/day: 0.25    Years: 30.00    Pack years: 7.50    Types: Cigarettes   Smokeless tobacco: Former  Scientific laboratory technician Use: Never used  Substance Use Topics   Alcohol use: No   Drug use: Yes    Types: Heroin, IV    Comment: Fentanyl and "Oxy" per patient     Family History: family history includes COPD in his father.  ROS:   Please see the history of present illness. (+) Myalgias (+) Fatigue (+) Chills All other systems are reviewed and negative.    EKGs/Labs/Other Studies Reviewed:    The following studies were reviewed today:  TEE 01/03/2019 1. The left ventricle  has normal systolic function, with an ejection  fraction of 60-65%. The cavity size was normal.   2. The right ventricle has normal systolc function. The cavity was  normal. There is no increase in right ventricular wall thickness.   3. The mitral valve is normal in structure.   4. The tricuspid valve was normal in structure.   5. The aortic valve is normal in structure.   6. The pulmonic valve was normal in structure.   7. The aortic root, ascending aorta, aortic arch and descending aorta are  normal in size and structure.   8. There is evidence of plaque in the descending aorta.   9. No echo evidence of endocarditis.   Echo 12/24/2018 1. The left ventricle has hyperdynamic systolic function, with an  ejection fraction of >65%. The cavity size was normal. Left ventricular  diastolic parameters  were normal.   2. The right ventricle has normal systolic function. The cavity was  normal. There is no increase in right ventricular wall thickness. Right  ventricular systolic pressure could not be assessed.   3. Trivial pericardial effusion is present.   4. The pericardial effusion is circumferential.   5. No hemodynamically significant valvular stenosis or regurgitation.   6. The mitral valve is normal in structure. Mild calcification of the  mitral valve leaflet.   7. The tricuspid valve is normal in structure.   8. The aortic valve has an indeterminant number of cusps.   9. The pulmonic valve was normal in structure.  10. No definite valvular vegetations. Image quality is not sufficient for  very small valvular lesions.   EKG:  EKG is personally reviewed.   10/11/2021: SR at 86 bpm with [facility] 07/01/2021 Sinus bradycardia at 46 bpm  Recent Labs: 06/14/2021: B Natriuretic Peptide 28.2 07/16/2021: ALT 15; BUN 18; Creatinine 1.63; Hemoglobin 9.2; Platelet Count 40; Potassium 4.6; Sodium 137   Recent Lipid Panel No results found for: CHOL, TRIG, HDL, CHOLHDL, VLDL, LDLCALC,  LDLDIRECT  Physical Exam:    VS:  BP (!) 152/110 (BP Location: Right Arm, Patient Position: Sitting, Cuff Size: Large)   Pulse 86   Ht 6' (1.829 m)   Wt 200 lb 3.2 oz (90.8 kg)   BMI 27.15 kg/m    No data found.   Wt Readings from Last 3 Encounters:  10/11/21 200 lb 3.2 oz (90.8 kg)  07/23/21 190 lb (86.2 kg)  07/16/21 194 lb 1.6 oz (88 kg)    GEN: Well nourished, well developed in no acute distress HEENT: Normal, moist mucous membranes NECK: No JVD CARDIAC: regular rhythm, normal S1 and S2, no rubs or gallops. No murmur. VASCULAR: Radial and DP pulses 2+ bilaterally. No carotid bruits RESPIRATORY:  Clear to auscultation without rales, wheezing or rhonchi  ABDOMEN: Soft, non-tender, non-distended MUSCULOSKELETAL:  Ambulates independently SKIN: Warm and dry, no significant edema NEUROLOGIC:  Alert and oriented x 3. No focal neuro deficits noted. PSYCHIATRIC:  Normal affect    ASSESSMENT:    1. Essential hypertension   2. History of hypotension   3. History of syncope   4. Type 2 diabetes mellitus without complication, with long-term current use of insulin (H[facility])   5. Other cirrhosis of liver ([place])     PLAN:    History of syncope/hypotension -echocardiogram (last 2020) reordered, not performed -blood pressures are now elevated as he has been free of IV opiate use for 80 days per patient report  Hypertension: elevated today, but feels poorly. Has history of hypotension as above. Would be cautious not to overtreat. If remains elevated, would consider amlodipine but monitor for LE edema  History of recurrent infections, chest wounds -last debridement of sternal wounds 01/2019  Type II diabetes -reports sugars have been elevated recently, working on management of this -insulin per PCP -would typically recommend statin/aspirin, but given cirrhosis and pancytopenia, will avoid these  History of hepatitis C Pancytopenia History of cirrhosis -S/P epclusa  Cardiac risk  counseling and prevention recommendations: -recommend heart healthy/Mediterranean diet, with whole grains, fruits, vegetable, fish, lean meats, nuts, and olive oil. Limit salt. -recommend moderate walking, 3-5 times/week for 30-50 minutes each session. Aim for at least 150 minutes.week. Goal should be pace of 3 miles/hours, or walking 1.5 miles in 30 minutes -recommend avoidance of tobacco products. Avoid excess alcohol. -ASCVD risk score: The ASCVD Risk score (Arnett DK, et al.,  2019) failed to calculate for the following reasons:   Cannot find a previous HDL lab    Plan for follow up: 3 mos or sooner as needed  Buford Dresser, MD, PhD, [facility]    Medication Adjustments/Labs and Tests Ordered: Current medicines are reviewed at length with the patient today.  Concerns regarding medicines are outlined above.   Orders Placed This Encounter  Procedures   EKG 12-Lead    No orders of the defined types were placed in this encounter.   Patient Instructions  Medication Instructions:  Your Physician recommend you continue on your current medication as directed.    *If you need a refill on your cardiac medications before your next appointment, please call your pharmacy*   Lab Work: None ordered today   Testing/Procedures: None ordered today   Follow-Up: At [facility], you and your health needs are our priority.  As part of our continuing mission to provide you with exceptional heart care, we have created designated Provider Care Teams.  These Care Teams include your primary Cardiologist (physician) and Advanced Practice Providers (APPs -  Physician Assistants and Nurse Practitioners) who all work together to provide you with the care you need, when you need it.  We recommend signing up for the patient portal called "MyChart".  Sign up information is provided on this After Visit Summary.  MyChart is used to connect with patients for Virtual Visits  (Telemedicine).  Patients are able to view lab/test results, encounter notes, upcoming appointments, etc.  Non-urgent messages can be sent to your provider as well.   To learn more about what you can do with MyChart, go to [email or web address].    Your next appointment:   3 month(s)  The format for your next appointment:   In Person  Provider:   Buford Dresser, MD       [facility] Stumpf,acting as a scribe for Buford Dresser, MD.,have documented all relevant documentation on the behalf of Buford Dresser, MD,as directed by  Buford Dresser, MD while in the presence of Buford Dresser, MD.  I, Buford Dresser, MD, have reviewed all documentation for this visit. The documentation on 12/20/21 for the exam, diagnosis, procedures, and orders are all accurate and complete.   Signed, Buford Dresser, MD PhD 10/11/2021     [facility]

## 2021-10-11 NOTE — Patient Instructions (Signed)

## 2021-10-18 ENCOUNTER — Emergency Department (HOSPITAL_BASED_OUTPATIENT_CLINIC_OR_DEPARTMENT_OTHER): Payer: Medicaid Other | Admitting: Radiology

## 2021-10-18 ENCOUNTER — Observation Stay (HOSPITAL_BASED_OUTPATIENT_CLINIC_OR_DEPARTMENT_OTHER)
Admission: EM | Admit: 2021-10-18 | Discharge: 2021-10-20 | Disposition: A | Discharge status: Home / Self Care | Payer: Medicaid Other | Attending: Internal Medicine | Admitting: Internal Medicine

## 2021-10-18 ENCOUNTER — Encounter (HOSPITAL_BASED_OUTPATIENT_CLINIC_OR_DEPARTMENT_OTHER): Payer: Self-pay

## 2021-10-18 ENCOUNTER — Other Ambulatory Visit: Payer: Self-pay

## 2021-10-18 ENCOUNTER — Emergency Department (HOSPITAL_BASED_OUTPATIENT_CLINIC_OR_DEPARTMENT_OTHER): Payer: Medicaid Other

## 2021-10-18 DIAGNOSIS — E1142 Type 2 diabetes mellitus with diabetic polyneuropathy: Secondary | ICD-10-CM

## 2021-10-18 DIAGNOSIS — Z79899 Other long term (current) drug therapy: Secondary | ICD-10-CM | POA: Diagnosis not present

## 2021-10-18 DIAGNOSIS — R042 Hemoptysis: Secondary | ICD-10-CM

## 2021-10-18 DIAGNOSIS — Z794 Long term (current) use of insulin: Secondary | ICD-10-CM | POA: Insufficient documentation

## 2021-10-18 DIAGNOSIS — E119 Type 2 diabetes mellitus without complications: Secondary | ICD-10-CM | POA: Diagnosis not present

## 2021-10-18 DIAGNOSIS — J189 Pneumonia, unspecified organism: Secondary | ICD-10-CM | POA: Diagnosis not present

## 2021-10-18 DIAGNOSIS — F17210 Nicotine dependence, cigarettes, uncomplicated: Secondary | ICD-10-CM | POA: Insufficient documentation

## 2021-10-18 DIAGNOSIS — D696 Thrombocytopenia, unspecified: Secondary | ICD-10-CM | POA: Diagnosis not present

## 2021-10-18 DIAGNOSIS — I10 Essential (primary) hypertension: Secondary | ICD-10-CM | POA: Diagnosis not present

## 2021-10-18 DIAGNOSIS — D61818 Other pancytopenia: Secondary | ICD-10-CM | POA: Diagnosis present

## 2021-10-18 DIAGNOSIS — Z20822 Contact with and (suspected) exposure to covid-19: Secondary | ICD-10-CM | POA: Diagnosis not present

## 2021-10-18 DIAGNOSIS — B182 Chronic viral hepatitis C: Secondary | ICD-10-CM | POA: Diagnosis present

## 2021-10-18 DIAGNOSIS — Z7984 Long term (current) use of oral hypoglycemic drugs: Secondary | ICD-10-CM | POA: Diagnosis not present

## 2021-10-18 LAB — COMPREHENSIVE METABOLIC PANEL
ALT: 26 U/L (ref 0–44)
AST: 26 U/L (ref 15–41)
Albumin: 4.2 g/dL (ref 3.5–5.0)
Alkaline Phosphatase: 78 U/L (ref 38–126)
Anion gap: 7 (ref 5–15)
BUN: 17 mg/dL (ref 6–20)
CO2: 25 mmol/L (ref 22–32)
Calcium: 8.8 mg/dL — ABNORMAL LOW (ref 8.9–10.3)
Chloride: 102 mmol/L (ref 98–111)
Creatinine, Ser: 0.98 mg/dL (ref 0.61–1.24)
GFR, Estimated: >60 mL/min (ref >60)
Glucose, Bld: 293 mg/dL — ABNORMAL HIGH (ref 70–99)
Potassium: 4.1 mmol/L (ref 3.5–5.1)
Sodium: 134 mmol/L — ABNORMAL LOW (ref 135–145)
Total Bilirubin: 0.6 mg/dL (ref 0.3–1.2)
Total Protein: 7.1 g/dL (ref 6.5–8.1)

## 2021-10-18 LAB — CBC WITH DIFFERENTIAL/PLATELET
Abs Immature Granulocytes: 0.01 K/uL (ref 0.00–0.07)
Basophils Absolute: 0 K/uL (ref 0.0–0.1)
Basophils Relative: 1 %
Eosinophils Absolute: 0.2 K/uL (ref 0.0–0.5)
Eosinophils Relative: 6 %
HCT: 32.9 % — ABNORMAL LOW (ref 39.0–52.0)
Hemoglobin: 11.4 g/dL — ABNORMAL LOW (ref 13.0–17.0)
Immature Granulocytes: 0 %
Lymphocytes Relative: 15 %
Lymphs Abs: 0.4 K/uL — ABNORMAL LOW (ref 0.7–4.0)
MCH: 29.8 pg (ref 26.0–34.0)
MCHC: 34.7 g/dL (ref 30.0–36.0)
MCV: 85.9 fL (ref 80.0–100.0)
Monocytes Absolute: 0.2 K/uL (ref 0.1–1.0)
Monocytes Relative: 8 %
Neutro Abs: 2.1 K/uL (ref 1.7–7.7)
Neutrophils Relative %: 70 %
Platelets: 36 K/uL — ABNORMAL LOW (ref 150–400)
RBC: 3.83 MIL/uL — ABNORMAL LOW (ref 4.22–5.81)
RDW: 13.8 % (ref 11.5–15.5)
WBC: 2.9 K/uL — ABNORMAL LOW (ref 4.0–10.5)
nRBC: 0 % (ref 0.0–0.2)

## 2021-10-18 LAB — LACTIC ACID, PLASMA: Lactic Acid, Venous: 0.8 mmol/L (ref 0.5–1.9)

## 2021-10-18 LAB — PROTIME-INR
INR: 1 (ref 0.8–1.2)
Prothrombin Time: 13.6 seconds (ref 11.4–15.2)

## 2021-10-18 LAB — RESP PANEL BY RT-PCR (FLU A&B, COVID) ARPGX2
Influenza A by PCR: NEGATIVE
Influenza B by PCR: NEGATIVE
SARS Coronavirus 2 by RT PCR: NEGATIVE

## 2021-10-18 MED ORDER — SODIUM CHLORIDE 0.9 % IV SOLN
1.0000 g | Freq: Once | INTRAVENOUS | Status: AC
  Administered 2021-10-18: 1 g via INTRAVENOUS
  Filled 2021-10-18: qty 10

## 2021-10-18 MED ORDER — IOHEXOL 350 MG/ML SOLN
100.0000 mL | Freq: Once | INTRAVENOUS | Status: AC | PRN
  Administered 2021-10-18: 72 mL via INTRAVENOUS

## 2021-10-18 MED ORDER — AZITHROMYCIN 500 MG IV SOLR
500.0000 mg | INTRAVENOUS | Status: DC
  Administered 2021-10-18 – 2021-10-19 (×2): 500 mg via INTRAVENOUS
  Filled 2021-10-18 (×3): qty 5

## 2021-10-18 NOTE — ED Provider Notes (Signed)
[facility] EMERGENCY DEPT Provider Note   CSN: [id number] Arrival date & time: 10/18/21  1417     History Chief Complaint  Patient presents with   Hemoptysis    Chase Reed is a 55 y.o. male.  Pt presents to the ED today coughing up blood.  Pt said he woke up coughing this morning and saw there was a lot of blood in it.  Pt said it happened a few times in the night and he was finally able to sleep.  He is still coughing up the blood now, but it is not as bad.  Pt does have a hx of thrombocytopenia due to cirrhosis, but is not on blood thinners.  The pt has not had f/c.  No hx of this in the past.      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   Cirrhosis (HCC) 12/24/2018   Substance induced mood disorder (HCC) 12/21/2016   Opioid use disorder, severe, dependence (HCC) 05/02/2016   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   Elevated lipase 05/02/2016    Past Surgical History:  Procedure Laterality Date   APPLICATION OF WOUND VAC N/A 12/24/2018   Procedure: APPLICATION OF WOUND VAC;  Surgeon: Alleen Borne, MD;  Location: [facility] OR;  Service: Thoracic;  Laterality: N/A;   APPLICATION OF WOUND VAC N/A 12/27/2018   Procedure: WOUND VAC CHANGE;  Surgeon: Alleen Borne, MD;  Location: [facility] OR;  Service: Thoracic;  Laterality: N/A;   APPLICATION OF WOUND  VAC N/A 01/14/2019   Procedure: APPLICATION OF WOUND VAC;  Surgeon: Alleen Borne, MD;  Location: [facility] OR;  Service: Vascular;  Laterality: N/A;   I & D EXTREMITY N/A 01/14/2019   Procedure: DEBRIDEMENT OF STERNOCLAVICULAR JOINT;  Surgeon: Alleen Borne, MD;  Location: [facility] OR;  Service: Vascular;  Laterality: N/A;   I & D EXTREMITY Right 10/03/2020   Procedure: Irrigation debridement of RIGHT forearm subfascial abscess measuring approximately 6 x 3 x 1.5 cm;  Surgeon: Ernest Mallick, MD;  Location: [facility] OR;  Service: Orthopedics;  Laterality: Right;   LITHOTRIPSY     STERNAL WOUND DEBRIDEMENT N/A 12/24/2018   Procedure: I&D CHEST WALL ABSCESS;  Surgeon: Alleen Borne, MD;  Location: [facility] OR;  Service: Thoracic;  Laterality: N/A;   STERNAL WOUND DEBRIDEMENT N/A 12/27/2018   Procedure: DEBRIDEMENT OF CHEST WALL WOUND;  Surgeon: Alleen Borne, MD;  Location: [facility] OR;  Service: Thoracic;  Laterality: N/A;   TEE WITHOUT CARDIOVERSION N/A 01/03/2019   Procedure: TRANSESOPHAGEAL ECHOCARDIOGRAM (TEE);  Surgeon: Thurmon Fair, MD;  Location: [facility] ENDOSCOPY;  Service: Cardiovascular;  Laterality: N/A;       Family History  Problem Relation Age of Onset   COPD Father     Social History   Tobacco Use   Smoking status: Some Days    Packs/day: 0.25    Years: 30.00    Pack  years: 7.50    Types: Cigarettes   Smokeless tobacco: Former  Building services engineer Use: Never used  Substance Use Topics   Alcohol use: No   Drug use: Yes    Types: Heroin, IV    Comment: Fentanyl and "Oxy" per patient     Home Medications Prior to Admission medications   Medication Sig Start Date End Date Taking? Authorizing Provider  atomoxetine (STRATTERA) 80 MG capsule Take 1 capsule (80 mg total) by mouth daily. 07/29/21   Shanna Cisco, NP  bacitracin-neomycin-polymyxin-hydrocortisone (CORTISPORIN) 1 % ointment Apply 1 application topically 2 (two) times daily. 01/25/21   Gardiner Barefoot, MD  COVID-19 mRNA  vaccine, Moderna, 100 MCG/0.5ML injection INJECT AS DIRECTED 12/10/20 12/10/21  Judyann Munson, MD  DULoxetine (CYMBALTA) 60 MG capsule Take 1 capsule (60 mg total) by mouth daily. 07/29/21   Shanna Cisco, NP  gabapentin (NEURONTIN) 600 MG tablet Take 600 mg by mouth in the morning, at noon, in the evening, and at bedtime.  01/20/19   [provider]  insulin aspart (NOVOLOG) 100 UNIT/ML injection Inject 2-8 Units into the skin See admin instructions. Sliding scale.over 250--2 units over 300--4 units over 350--6 units over 400--8 units Up to 50 units daily: For diabetes management Patient taking differently: Inject 2-8 Units into the skin See admin instructions. Take 2-8 units as needed  Sliding scale.over 250--2 units over 300--4 units over 350--6 units over 400--8 units Up to 50 units daily: For diabetes management 12/25/16   Armandina Stammer I, NP  insulin glargine (LANTUS) 100 UNIT/ML injection Inject 30 Units into the skin daily.     [provider]  Iron, Ferrous Sulfate, 325 (65 Fe) MG TABS Take 325 mg by mouth daily. 09/23/19   Jaci Standard, MD  JANUVIA 100 MG tablet Take 100 mg by mouth daily. 05/07/21   [provider]  metFORMIN (GLUCOPHAGE) 1000 MG tablet Take 1,000 mg by mouth 2 (two) times daily. 05/07/21   [provider]  risperiDONE (RISPERDAL) 2 MG tablet Take 1 tablet (2 mg total) by mouth at bedtime. 07/29/21   Shanna Cisco, NP  sertraline (ZOLOFT) 100 MG tablet Take 100 mg by mouth daily. 03/11/21   [provider]  Sofosbuvir-Velpatasvir (EPCLUSA) 400-100 MG TABS Take 1 tablet by mouth daily. 02/25/21   Kuppelweiser, Cassie L, RPH-CPP  traZODone (DESYREL) 50 MG tablet Take 2 tablets (100 mg total) by mouth at bedtime as needed for sleep. 07/29/21   Shanna Cisco, NP    Allergies    Atorvastatin  Review of Systems   Review of Systems  Respiratory:  Positive for cough.        Hemoptysis  All other systems reviewed  and are negative.  Physical Exam Updated Vital Signs BP (!) 179/115 (BP Location: Right Arm)    Pulse 74    Temp 98.6 F (37 C) (Oral)    Resp 16    Ht 6' (1.829 m)    Wt 90.8 kg    SpO2 100%    BMI 27.15 kg/m   Physical Exam Vitals and nursing note reviewed.  Constitutional:      Appearance: Normal appearance.  HENT:     Head: Normocephalic and atraumatic.     Right Ear: External ear normal.     Left Ear: External ear normal.     Nose: Nose normal.     Mouth/Throat:     Mouth: Mucous membranes are moist.  Pharynx: Oropharynx is clear.  Eyes:     Extraocular Movements: Extraocular movements intact.     Conjunctiva/sclera: Conjunctivae normal.     Pupils: Pupils are equal, round, and reactive to light.  Cardiovascular:     Rate and Rhythm: Normal rate and regular rhythm.     Pulses: Normal pulses.     Heart sounds: Normal heart sounds.  Pulmonary:     Effort: Pulmonary effort is normal.     Breath sounds: Normal breath sounds.  Abdominal:     General: Abdomen is flat. Bowel sounds are normal.     Palpations: Abdomen is soft.  Musculoskeletal:        General: Normal range of motion.     Cervical back: Normal range of motion and neck supple.  Skin:    General: Skin is warm.     Capillary Refill: Capillary refill takes less than 2 seconds.  Neurological:     General: No focal deficit present.     Mental Status: He is alert and oriented to person, place, and time.  Psychiatric:        Mood and Affect: Mood normal.        Behavior: Behavior normal.    ED Results / Procedures / Treatments   Labs (all labs ordered are listed, but only abnormal results are displayed) Labs Reviewed  COMPREHENSIVE METABOLIC PANEL - Abnormal; Notable for the following components:      Result Value   Sodium 134 (*)    Glucose, Bld 293 (*)    Calcium 8.8 (*)    All other components within normal limits  CBC WITH DIFFERENTIAL/PLATELET - Abnormal; Notable for the following components:    WBC 2.9 (*)    RBC 3.83 (*)    Hemoglobin 11.4 (*)    HCT 32.9 (*)    Platelets 36 (*)    Lymphs Abs 0.4 (*)    All other components within normal limits  RESP PANEL BY RT-PCR (FLU A&B, COVID) ARPGX2  CULTURE, BLOOD (ROUTINE X 2)  CULTURE, BLOOD (ROUTINE X 2)  PROTIME-INR  QUANTIFERON-TB GOLD PLUS  LACTIC ACID, PLASMA  LACTIC ACID, PLASMA    EKG None  Radiology DG Chest 2 View  Result Date: 10/18/2021 CLINICAL DATA:  Coughing up blood. EXAM: CHEST - 2 VIEW COMPARISON:  June 14, 2021 FINDINGS: The heart size and mediastinal contours are within normal limits. Both lungs are clear. The visualized skeletal structures are unremarkable. IMPRESSION: No active cardiopulmonary disease. Electronically Signed   By: Gerome Sam III M.D.   On: 10/18/2021 15:24   CT Angio Chest PE W and/or Wo Contrast  Result Date: 10/18/2021 CLINICAL DATA:  High concern for pulmonary embolism. Hemoptysis and shortness of breath. EXAM: CT ANGIOGRAPHY CHEST WITH CONTRAST TECHNIQUE: Multidetector CT imaging of the chest was performed using the standard protocol during bolus administration of intravenous contrast. Multiplanar CT image reconstructions and MIPs were obtained to evaluate the vascular anatomy. CONTRAST:  2mL OMNIPAQUE IOHEXOL 350 MG/ML SOLN COMPARISON:  None. FINDINGS: Cardiovascular: No filling defects within the pulmonary arteries to suggest acute pulmonary embolism. Coronary artery calcification and aortic atherosclerotic calcification. Mediastinum/Nodes: No mediastinal adenopathy. No axillary or supraclavicular adenopathy. Medial to the sternal wound with deformity of the manubrium. Lungs/Pleura: Segmental airspace disease in the anterior aspect of the RIGHT upper lobe with mild nodularity (image 58/7). No pleural fluid. No pneumothorax Upper Abdomen: 6 Limited view of the liver, kidneys, pancreas are unremarkable. Normal adrenal glands. Nodular contour of the liver.  Spleen is enlarged. Gallstones  noted. Musculoskeletal: No aggressive osseous lesion. Remote sternal fracture with interval healing Review of the MIP images confirms the above findings. IMPRESSION: 1. RIGHT upper lobe nodular airspace disease is most suggestive of pulmonary infection. Pulmonary hemorrhage could have a similar appearance. Combination of infection with hemorrhage could also be considered in patient presenting with hemoptysis. 2. No acute pulmonary embolism. 3. No mediastinal lymphadenopathy. 4. No pleural fluid. 5. Remote healed sternal wound. Electronically Signed   By: Genevive Bi M.D.   On: 10/18/2021 16:59    Procedures Procedures   Medications Ordered in ED Medications  cefTRIAXone (ROCEPHIN) 1 g in sodium chloride 0.9 % 100 mL IVPB (1 g Intravenous New Bag/Given 10/18/21 1826)  azithromycin (ZITHROMAX) 500 mg in sodium chloride 0.9 % 250 mL IVPB (has no administration in time range)  iohexol (OMNIPAQUE) 350 MG/ML injection 100 mL (72 mLs Intravenous Contrast Given 10/18/21 1636)    ED Course  I have reviewed the triage vital signs and the nursing notes.  Pertinent labs & imaging results that were available during my care of the patient were reviewed by me and considered in my medical decision making (see chart for details).    MDM Rules/Calculators/A&P                         Pt does have an abnormality in his RUL on CT scan.  I spoke with pulmonology who recommend treatment for CAP and admission for obs to medicine.  They would not do a bronch right away as pt is stable and his platelets are so low.  He can follow up as an outpatient.  Pt d/w Dr. Cyndia Bent (triad) for admission.  Covid/flu neg.  CRITICAL CARE Performed by: Jacalyn Lefevre   Total critical care time: 30 minutes  Critical care time was exclusive of separately billable procedures and treating other patients.  Critical care was necessary to treat or prevent imminent or life-threatening deterioration.  Critical care was time spent  personally by me on the following activities: development of treatment plan with patient and/or surrogate as well as nursing, discussions with consultants, evaluation of patient's response to treatment, examination of patient, obtaining history from patient or surrogate, ordering and performing treatments and interventions, ordering and review of laboratory studies, ordering and review of radiographic studies, pulse oximetry and re-evaluation of patient's condition.   Final Clinical Impression(s) / ED Diagnoses Final diagnoses:  Community acquired pneumonia of right upper lobe of lung  Hemoptysis  Thrombocytopenia (HCC)    Rx / DC Orders ED Discharge Orders     None        Jacalyn Lefevre, MD 10/18/21 [phone]

## 2021-10-18 NOTE — ED Notes (Signed)
Attempt to call report.

## 2021-10-18 NOTE — ED Triage Notes (Signed)
Patient here POV from Home for Cough.  Patient had Episodes of Coughing early this AM with Blood noted as well. No Fevers. No Other Symptoms.   NAD Noted during Triage. No Chest Pain. A&Ox4. GCS 15. Ambulatory.

## 2021-10-18 NOTE — ED Notes (Signed)
[facility] called for report, ETA 10 min 

## 2021-10-18 NOTE — ED Notes (Signed)
[facility] otw to transport patient to [facility] 63M room 13

## 2021-10-19 DIAGNOSIS — J189 Pneumonia, unspecified organism: Principal | ICD-10-CM

## 2021-10-19 DIAGNOSIS — I10 Essential (primary) hypertension: Secondary | ICD-10-CM

## 2021-10-19 DIAGNOSIS — D61818 Other pancytopenia: Secondary | ICD-10-CM

## 2021-10-19 DIAGNOSIS — B182 Chronic viral hepatitis C: Secondary | ICD-10-CM | POA: Diagnosis not present

## 2021-10-19 DIAGNOSIS — E1165 Type 2 diabetes mellitus with hyperglycemia: Secondary | ICD-10-CM

## 2021-10-19 DIAGNOSIS — R042 Hemoptysis: Secondary | ICD-10-CM | POA: Diagnosis not present

## 2021-10-19 DIAGNOSIS — Z794 Long term (current) use of insulin: Secondary | ICD-10-CM

## 2021-10-19 LAB — COMPREHENSIVE METABOLIC PANEL
ALT: 30 U/L (ref 0–44)
AST: 28 U/L (ref 15–41)
Albumin: 3.5 g/dL (ref 3.5–5.0)
Alkaline Phosphatase: 88 U/L (ref 38–126)
Anion gap: 7 (ref 5–15)
BUN: 16 mg/dL (ref 6–20)
CO2: 24 mmol/L (ref 22–32)
Calcium: 8.8 mg/dL — ABNORMAL LOW (ref 8.9–10.3)
Chloride: 103 mmol/L (ref 98–111)
Creatinine, Ser: 1.19 mg/dL (ref 0.61–1.24)
GFR, Estimated: >60 mL/min (ref >60)
Glucose, Bld: 178 mg/dL — ABNORMAL HIGH (ref 70–99)
Potassium: 3.9 mmol/L (ref 3.5–5.1)
Sodium: 134 mmol/L — ABNORMAL LOW (ref 135–145)
Total Bilirubin: 0.5 mg/dL (ref 0.3–1.2)
Total Protein: 6.5 g/dL (ref 6.5–8.1)

## 2021-10-19 LAB — CBC
HCT: 32.6 % — ABNORMAL LOW (ref 39.0–52.0)
Hemoglobin: 11.4 g/dL — ABNORMAL LOW (ref 13.0–17.0)
MCH: 30.3 pg (ref 26.0–34.0)
MCHC: 35 g/dL (ref 30.0–36.0)
MCV: 86.7 fL (ref 80.0–100.0)
Platelets: 36 10*3/uL — ABNORMAL LOW (ref 150–400)
RBC: 3.76 MIL/uL — ABNORMAL LOW (ref 4.22–5.81)
RDW: 13.5 % (ref 11.5–15.5)
WBC: 2.7 10*3/uL — ABNORMAL LOW (ref 4.0–10.5)
nRBC: 0 % (ref 0.0–0.2)

## 2021-10-19 LAB — GLUCOSE, CAPILLARY
Glucose-Capillary: 176 mg/dL — ABNORMAL HIGH (ref 70–99)
Glucose-Capillary: 199 mg/dL — ABNORMAL HIGH (ref 70–99)
Glucose-Capillary: 254 mg/dL — ABNORMAL HIGH (ref 70–99)
Glucose-Capillary: 138 mg/dL — ABNORMAL HIGH (ref 70–99)
Glucose-Capillary: 198 mg/dL — ABNORMAL HIGH (ref 70–99)

## 2021-10-19 LAB — HIV ANTIBODY (ROUTINE TESTING W REFLEX): HIV Screen 4th Generation wRfx: NONREACTIVE

## 2021-10-19 LAB — HEMOGLOBIN A1C
Hgb A1c MFr Bld: 7.7 % — ABNORMAL HIGH (ref 4.8–5.6)
Mean Plasma Glucose: 174.29 mg/dL

## 2021-10-19 LAB — STREP PNEUMONIAE URINARY ANTIGEN: Strep Pneumo Urinary Antigen: NEGATIVE

## 2021-10-19 MED ORDER — INSULIN ASPART 100 UNIT/ML IJ SOLN: 0.0000 [IU] | Freq: Three times a day (TID) | INTRAMUSCULAR | Status: DC

## 2021-10-19 MED ORDER — SODIUM CHLORIDE 0.9 % IV SOLN: 500.0000 mg | INTRAVENOUS | Status: DC

## 2021-10-19 MED ORDER — INSULIN ASPART 100 UNIT/ML IJ SOLN
0.0000 [IU] | Freq: Three times a day (TID) | INTRAMUSCULAR | Status: DC
Start: 2021-10-19 — End: 2021-10-19

## 2021-10-19 MED ORDER — SODIUM CHLORIDE 0.9 % IV SOLN
2.0000 g | INTRAVENOUS | Status: DC
  Administered 2021-10-19: 2 g via INTRAVENOUS
  Filled 2021-10-19: qty 20

## 2021-10-19 MED ORDER — LABETALOL HCL 5 MG/ML IV SOLN: 5.0000 mg | INTRAVENOUS | Status: DC | PRN

## 2021-10-19 NOTE — Progress Notes (Signed)
Patient ID: Chase Reed, male   DOB: [birth year], 55 y.o.   MRN: [id number] Patient admitted early this morning for hemoptysis and has been started on IV antibiotics for community-acquired pneumonia.  Patient seen and examined at bedside and plan of care discussed with him.  I have reviewed patient's medical records including this morning's H&P, current vitals, labs, medications myself.  Continue current antibiotics.  Follow cultures.  Repeat a.m. labs.  Currently on room air.  Oxygen supplementation if needed.  Outpatient follow-up with pulmonary.

## 2021-10-19 NOTE — H&P (Signed)
History and Physical    Chase Reed HER:[id number] DOB: Jan 09, 1966 DOA: 10/18/2021  PCP: Courtney Heys, PA-C  Patient coming from: [facility]  I have personally briefly reviewed patient's old medical records in [facility]  Chief Complaint: Hemoptysis  HPI: Chase Reed is a 55 y.o. male with medical history significant for hypertension, hx of IV drug use, chronic hepatitis with resultant cirrhosis, Type 2 DM transfer [facility] to [facility] for hemoptysis and community-acquired pneumonia.  Patient was otherwise in his normal state of health and then last night around 1 AM he began to have persistent coughing with bright red blood.  He again had hemoptysis again this morning although improved with less blood and only specks.  Spoke with his primary physician and was told to present to the ED. He denies feeling ill or any coughing prior to this.  Thinks the blood was what provoked him to cough.  No fever.  No shortness of breath.  No chest pain. Reports ongoing tobacco use of about 1 pack/week.  Has been vaping more frequently.  ED Course: He was afebrile, hypertensive with systolic up to 160/113. WBC of 2.9, hemoglobin of 11.4, platelet of 36. Sodium of 134, K of 4.1, BG of 293, normal LFTs.  CTA chest showed right upper lobe airspace disease with possible hemorrhage. EDP spoke with pulmonology who did not recommend  emergent bronchoscopy since he is stable and high risk with low platelet count  of 36. No more hemoptysis seen in the ER.  Started on Rocep and Azithromycin.  Review of Systems: Constitutional: No Weight Change, No Fever ENT/Mouth: No sore throat, No Rhinorrhea Eyes: No Eye Pain, No Vision Changes Cardiovascular: No Chest Pain, no SOB Respiratory: + Cough, No Sputum Gastrointestinal: No Nausea, No Vomiting, No Diarrhea, No Constipation, No Pain Genitourinary: no Urinary Incontinence Musculoskeletal: No Arthralgias, No Myalgias Skin: No Skin  Lesions, No Pruritus, Neuro: no Weakness, No Numbness Psych: No Anxiety/Panic, No Depression, no decrease appetite Heme/Lymph: No Bruising, + Bleeding   Past Medical History:  Diagnosis Date   Cirrhosis (HCC)    Diabetes mellitus without complication (HCC)    Hepatitis     Past Surgical History:  Procedure Laterality Date   APPLICATION OF WOUND VAC N/A 12/24/2018   Procedure: APPLICATION OF WOUND VAC;  Surgeon: Alleen Borne, MD;  Location: [facility] OR;  Service: Thoracic;  Laterality: N/A;   APPLICATION OF WOUND VAC N/A 12/27/2018   Procedure: WOUND VAC CHANGE;  Surgeon: Alleen Borne, MD;  Location: [facility] OR;  Service: Thoracic;  Laterality: N/A;   APPLICATION OF WOUND VAC N/A 01/14/2019   Procedure: APPLICATION OF WOUND VAC;  Surgeon: Alleen Borne, MD;  Location: [facility] OR;  Service: Vascular;  Laterality: N/A;   I & D EXTREMITY N/A 01/14/2019   Procedure: DEBRIDEMENT OF STERNOCLAVICULAR JOINT;  Surgeon: Alleen Borne, MD;  Location: [facility] OR;  Service: Vascular;  Laterality: N/A;   I & D EXTREMITY Right 10/03/2020   Procedure: Irrigation debridement of RIGHT forearm subfascial abscess measuring approximately 6 x 3 x 1.5 cm;  Surgeon: Ernest Mallick, MD;  Location: [facility] OR;  Service: Orthopedics;  Laterality: Right;   LITHOTRIPSY     STERNAL WOUND DEBRIDEMENT N/A 12/24/2018   Procedure: I&D CHEST WALL ABSCESS;  Surgeon: Alleen Borne, MD;  Location: [facility] OR;  Service: Thoracic;  Laterality: N/A;   STERNAL WOUND DEBRIDEMENT N/A 12/27/2018   Procedure: DEBRIDEMENT OF CHEST WALL WOUND;  Surgeon: Laneta Simmers,  Payton Doughty, MD;  Location: [facility] OR;  Service: Thoracic;  Laterality: N/A;   TEE WITHOUT CARDIOVERSION N/A 01/03/2019   Procedure: TRANSESOPHAGEAL ECHOCARDIOGRAM (TEE);  Surgeon: Thurmon Fair, MD;  Location: [facility] ENDOSCOPY;  Service: Cardiovascular;  Laterality: N/A;     reports that he has been smoking cigarettes. He has a 7.50 pack-year smoking history. He has quit using smokeless tobacco. He  reports current drug use. Drugs: Heroin and IV. He reports that he does not drink alcohol. Social History  Allergies  Allergen Reactions   Atorvastatin Other (See Comments)    Leg cramps and body aches    Family History  Problem Relation Age of Onset   COPD Father      Prior to Admission medications   Medication Sig Start Date End Date Taking? Authorizing Provider  atomoxetine (STRATTERA) 80 MG capsule Take 1 capsule (80 mg total) by mouth daily. 07/29/21   Shanna Cisco, NP  bacitracin-neomycin-polymyxin-hydrocortisone (CORTISPORIN) 1 % ointment Apply 1 application topically 2 (two) times daily. 01/25/21   Gardiner Barefoot, MD  COVID-19 mRNA vaccine, Moderna, 100 MCG/0.5ML injection INJECT AS DIRECTED 12/10/20 12/10/21  Judyann Munson, MD  DULoxetine (CYMBALTA) 60 MG capsule Take 1 capsule (60 mg total) by mouth daily. 07/29/21   Shanna Cisco, NP  gabapentin (NEURONTIN) 600 MG tablet Take 600 mg by mouth in the morning, at noon, in the evening, and at bedtime.  01/20/19   [provider]  insulin aspart (NOVOLOG) 100 UNIT/ML injection Inject 2-8 Units into the skin See admin instructions. Sliding scale.over 250--2 units over 300--4 units over 350--6 units over 400--8 units Up to 50 units daily: For diabetes management Patient taking differently: Inject 2-8 Units into the skin See admin instructions. Take 2-8 units as needed  Sliding scale.over 250--2 units over 300--4 units over 350--6 units over 400--8 units Up to 50 units daily: For diabetes management 12/25/16   Armandina Stammer I, NP  insulin glargine (LANTUS) 100 UNIT/ML injection Inject 30 Units into the skin daily.     [provider]  Iron, Ferrous Sulfate, 325 (65 Fe) MG TABS Take 325 mg by mouth daily. 09/23/19   Jaci Standard, MD  JANUVIA 100 MG tablet Take 100 mg by mouth daily. 05/07/21   [provider]  metFORMIN (GLUCOPHAGE) 1000 MG tablet Take 1,000 mg by mouth 2 (two) times daily.  05/07/21   [provider]  risperiDONE (RISPERDAL) 2 MG tablet Take 1 tablet (2 mg total) by mouth at bedtime. 07/29/21   Shanna Cisco, NP  sertraline (ZOLOFT) 100 MG tablet Take 100 mg by mouth daily. 03/11/21   [provider]  Sofosbuvir-Velpatasvir (EPCLUSA) 400-100 MG TABS Take 1 tablet by mouth daily. 02/25/21   Kuppelweiser, Cassie L, RPH-CPP  traZODone (DESYREL) 50 MG tablet Take 2 tablets (100 mg total) by mouth at bedtime as needed for sleep. 07/29/21   Shanna Cisco, NP    Physical Exam: Vitals:   10/18/21 1630 10/18/21 1830 10/18/21 2100 10/19/21 0011  BP:  (!) 179/115 (!) 195/131 (!) 160/113  Pulse: 80 74 82 76  Resp: 18 16 20 19   Temp:    98.1 F (36.7 C)  TempSrc:    Oral  SpO2: 97% 100% 100% 96%  Weight:      Height:        Constitutional: NAD, calm, comfortable, middle-aged gentleman sitting upright in bed Vitals:   10/18/21 1630 10/18/21 1830 10/18/21 2100 10/19/21 0011  BP:  10/21/21)  179/115 (!) 195/131 (!) 160/113  Pulse: 80 74 82 76  Resp: Temp:    98.1 F (36.7 C)  TempSrc:    Oral  SpO2: 97% 100% 100% 96%  Weight:      Height:       Eyes: PERRL, lids and conjunctivae normal ENMT: Mucous membranes are moist.  Neck: normal, supple Respiratory: clear to auscultation bilaterally, no wheezing, no crackles. Normal respiratory effort. No accessory muscle use.  Cardiovascular: Regular rate and rhythm, no murmurs / rubs / gallops. No extremity edema. Abdomen: Soft, nontender nondistended  musculoskeletal: no clubbing / cyanosis. No joint deformity upper and lower extremities. Good ROM, no contractures. Normal muscle tone.  Pain with palpation of the lower extremity which patient reports is due to his chronic neuropathy. Skin: no rashes, lesions, ulcers.  Neurologic: CN 2-12 grossly intact.  Strength 5/5 in all 4.  Psychiatric: Normal judgment and insight. Alert and oriented x 3. Normal mood.     Labs on Admission: I have  personally reviewed following labs and imaging studies  CBC: Recent Labs  Lab 10/18/21 1445  WBC 2.9*  NEUTROABS 2.1  HGB 11.4*  HCT 32.9*  MCV 85.9  PLT 36*   Basic Metabolic Panel: Recent Labs  Lab 10/18/21 1445  NA 134*  K 4.1  CL 102  CO2 25  GLUCOSE 293*  BUN 17  CREATININE 0.98  CALCIUM 8.8*   GFR: Estimated Creatinine Clearance: 95.7 mL/min (by C-G formula based on SCr of 0.98 mg/dL). Liver Function Tests: Recent Labs  Lab 10/18/21 1445  AST 26  ALT 26  ALKPHOS 78  BILITOT 0.6  PROT 7.1  ALBUMIN 4.2   No results for input(s): LIPASE, AMYLASE in the last 168 hours. No results for input(s): AMMONIA in the last 168 hours. Coagulation Profile: Recent Labs  Lab 10/18/21 1445  INR 1.0   Cardiac Enzymes: No results for input(s): CKTOTAL, CKMB, CKMBINDEX, TROPONINI in the last 168 hours. BNP (last 3 results) No results for input(s): PROBNP in the last 8760 hours. HbA1C: No results for input(s): HGBA1C in the last 72 hours. CBG: No results for input(s): GLUCAP in the last 168 hours. Lipid Profile: No results for input(s): CHOL, HDL, LDLCALC, TRIG, CHOLHDL, LDLDIRECT in the last 72 hours. Thyroid Function Tests: No results for input(s): TSH, T4TOTAL, FREET4, T3FREE, THYROIDAB in the last 72 hours. Anemia Panel: No results for input(s): VITAMINB12, FOLATE, FERRITIN, TIBC, IRON, RETICCTPCT in the last 72 hours. Urine analysis:    Component Value Date/Time   COLORURINE YELLOW 10/02/2020 2214   APPEARANCEUR CLEAR 10/02/2020 2214   LABSPEC 1.021 10/02/2020 2214   PHURINE 5.0 10/02/2020 2214   GLUCOSEU 150 (A) 10/02/2020 2214   HGBUR NEGATIVE 10/02/2020 2214   BILIRUBINUR NEGATIVE 10/02/2020 2214   KETONESUR NEGATIVE 10/02/2020 2214   PROTEINUR NEGATIVE 10/02/2020 2214   NITRITE NEGATIVE 10/02/2020 2214   LEUKOCYTESUR NEGATIVE 10/02/2020 2214    Radiological Exams on Admission: DG Chest 2 View  Result Date: 10/18/2021 CLINICAL DATA:  Coughing up  blood. EXAM: CHEST - 2 VIEW COMPARISON:  June 14, 2021 FINDINGS: The heart size and mediastinal contours are within normal limits. Both lungs are clear. The visualized skeletal structures are unremarkable. IMPRESSION: No active cardiopulmonary disease. Electronically Signed   By: Gerome Sam III M.D.   On: 10/18/2021 15:24   CT Angio Chest PE W and/or Wo Contrast  Result Date: 10/18/2021 CLINICAL DATA:  High concern for pulmonary embolism. Hemoptysis  and shortness of breath. EXAM: CT ANGIOGRAPHY CHEST WITH CONTRAST TECHNIQUE: Multidetector CT imaging of the chest was performed using the standard protocol during bolus administration of intravenous contrast. Multiplanar CT image reconstructions and MIPs were obtained to evaluate the vascular anatomy. CONTRAST:  54mL OMNIPAQUE IOHEXOL 350 MG/ML SOLN COMPARISON:  None. FINDINGS: Cardiovascular: No filling defects within the pulmonary arteries to suggest acute pulmonary embolism. Coronary artery calcification and aortic atherosclerotic calcification. Mediastinum/Nodes: No mediastinal adenopathy. No axillary or supraclavicular adenopathy. Medial to the sternal wound with deformity of the manubrium. Lungs/Pleura: Segmental airspace disease in the anterior aspect of the RIGHT upper lobe with mild nodularity (image 58/7). No pleural fluid. No pneumothorax Upper Abdomen: 6 Limited view of the liver, kidneys, pancreas are unremarkable. Normal adrenal glands. Nodular contour of the liver. Spleen is enlarged. Gallstones noted. Musculoskeletal: No aggressive osseous lesion. Remote sternal fracture with interval healing Review of the MIP images confirms the above findings. IMPRESSION: 1. RIGHT upper lobe nodular airspace disease is most suggestive of pulmonary infection. Pulmonary hemorrhage could have a similar appearance. Combination of infection with hemorrhage could also be considered in patient presenting with hemoptysis. 2. No acute pulmonary embolism. 3. No  mediastinal lymphadenopathy. 4. No pleural fluid. 5. Remote healed sternal wound. Electronically Signed   By: Genevive Bi M.D.   On: 10/18/2021 16:59      Assessment/Plan  Hemoptysis Community-acquired pneumonia -CTA chest showed right upper lobe airspace disease with possible hemorrhage. -Pulmonology consulted by ED physician who did not recommend urgent bronchoscopy since he was improving and was high risk with low platelet count of 36 -No episodes of hemoptysis while inpatient thus far -Continue with IV Rocephin and azithromycin -Obtain sputum culture, urine strep pneumo and Legionella  Hx of chronic hepatitis C w/ resultant cirrhosis -previously on Epclusa followed by ID Dr. Luciana Axe -No documentation of him following up for recent 54-month checkup -Check hepatitis C RNA viral load  Pancytopenia Platelet lower at 36.  Normal baseline of 140s to 50.  Continue to monitor closely and monitor any worsening symptoms of hemoptysis. Hgb stable at 11  Hypertension Uncontrolled PRN IV Labetalol for systolic greater than 170/110  Type 2 diabetes Hemoglobin A1c of 6.6 on 06/2021 Placed on moderate sliding scale  History of IV drug use Patient reports that he is quit for some time  DVT prophylaxis:SCDs Code Status: Full Family Communication: Plan discussed with patient at bedside  disposition Plan: Home with observation Consults called:  Admission status: Observation   Level of care: Telemetry Medical  Status is: Observation  The patient remains OBS appropriate and will d/c before 2 midnights.        Anselm Jungling DO [facility]   If 7PM-7AM, please contact night-coverage [email or web address]   10/19/2021, 1:15 AM

## 2021-10-19 NOTE — Progress Notes (Signed)
RN stopped Azithromycin infusion, assessed patient and flushed IV site with saline. No swelling or redness, pt denies of respiratory distress. No visible rash/hives. MD notified.

## 2021-10-19 NOTE — Progress Notes (Signed)
Pt is agreeing to stay

## 2021-10-19 NOTE — Progress Notes (Signed)
New Admission Note:   Arrival Method: via [facility] from [facility] ED Mental Orientation: alert and oriented x4 Telemetry: 5M13, CCMD notified Assessment: Completed Skin: Intact IV: LFA, nsl Pain: 0/10 Tubes: None Safety Measures: Safety Fall Prevention Plan has been discussed  Admission: to be completed 5 [place] Orientation: Patient has been oriented to the room, unit and staff.   Family: none at bedside  Orders to be reviewed and implemented. Will continue to monitor the patient. Call light has been placed within reach and bed alarm has been activated.

## 2021-10-19 NOTE — Progress Notes (Signed)
Pt is upset with meals he is getting from cafeteria ordered a different tray. Pt upset cant go off unit to get food from cafeteria. Assigned RN explained to the pt the reason we don't allow that and his diet. Pt is saying he will leave AMA. Dr. Hanley Ben notified.

## 2021-10-19 NOTE — Progress Notes (Signed)
Patient refused Insulin coverage, patient stated that the units according to sliding scale is too much for him. This RN explained about sliding scale, and will let MD know about his concerns.

## 2021-10-20 DIAGNOSIS — R042 Hemoptysis: Secondary | ICD-10-CM | POA: Diagnosis not present

## 2021-10-20 DIAGNOSIS — D61818 Other pancytopenia: Secondary | ICD-10-CM | POA: Diagnosis not present

## 2021-10-20 DIAGNOSIS — J189 Pneumonia, unspecified organism: Secondary | ICD-10-CM | POA: Diagnosis not present

## 2021-10-20 DIAGNOSIS — I10 Essential (primary) hypertension: Secondary | ICD-10-CM | POA: Diagnosis not present

## 2021-10-20 LAB — HCV RNA QUANT: HCV Quantitative: NOT DETECTED IU/mL (ref >50)

## 2021-10-20 LAB — GLUCOSE, CAPILLARY: Glucose-Capillary: 167 mg/dL — ABNORMAL HIGH (ref 70–99)

## 2021-10-20 MED ORDER — AMLODIPINE BESYLATE 5 MG PO TABS
5.0000 mg | ORAL_TABLET | Freq: Every day | ORAL | Status: DC
  Administered 2021-10-20: 5 mg via ORAL
  Filled 2021-10-20: qty 1

## 2021-10-20 MED ORDER — AMLODIPINE BESYLATE 5 MG PO TABS: 5.0000 mg | ORAL_TABLET | Freq: Every day | ORAL | 0 refills | Status: DC

## 2021-10-20 MED ORDER — HYDRALAZINE HCL 25 MG PO TABS: 25.0000 mg | ORAL_TABLET | Freq: Four times a day (QID) | ORAL | Status: DC | PRN

## 2021-10-20 MED ORDER — INSULIN ASPART 100 UNIT/ML ~~LOC~~ SOLN: 2.0000 [IU] | SUBCUTANEOUS | Status: AC

## 2021-10-20 MED ORDER — CEFUROXIME AXETIL 500 MG PO TABS: 500.0000 mg | ORAL_TABLET | Freq: Two times a day (BID) | ORAL | 0 refills | Status: AC

## 2021-10-20 NOTE — Discharge Summary (Signed)
Physician Discharge Summary  THEON SOBOTKA ZOX:[id number] DOB: 07-30-1966 DOA: 10/18/2021  PCP: Courtney Heys, PA-C  Admit date: 10/18/2021 Discharge date: 10/20/2021  Admitted From: Home Disposition: Home  Recommendations for Outpatient Follow-up:  Follow up with PCP in 1 week with repeat CBC/BMP Outpatient follow-up with pulmonary Outpatient follow-up with ID Follow up in ED if symptoms worsen or new appear   Home Health: No Equipment/Devices: None  Discharge Condition: Stable CODE STATUS: Full Diet recommendation: Heart healthy/carb modified  Brief/Interim Summary: 55 y.o. male with medical history significant for hypertension, hx of IV drug use, chronic hepatitis with resultant cirrhosis, diabetes mellitus type 2 presented with cough and hemoptysis.  On presentation, patient was hypertensive with platelets of 36; normal LFTs. CTA chest showed right upper lobe airspace disease with possible hemorrhage.  ED provider spoke to pulmonary on-call on phone who recommended conservative management with antibiotics: No need for emergent bronchoscopy as patient was stable and also had thrombocytopenia and possibly outpatient follow-up with pulmonary.  Patient was started on Rocephin and Zithromax.  During the hospitalization, patient has remained stable, hemoptysis is improving.  He is currently on room air.  He will be discharged home on oral Ceftin for 5 days.  Outpatient follow-up with PCP and pulmonary.  Discharge Diagnoses:   Community-acquired right upper lobar pneumonia Hemoptysis -ED provider spoke to pulmonary on-call on phone who recommended conservative management with antibiotics: No need for emergent bronchoscopy as patient was stable and also had thrombocytopenia and possibly outpatient follow-up with pulmonary.  -Currently on Rocephin and Zithromax.  Hemoptysis improving. -He is currently on room air.  He will be discharged home on oral Ceftin for 5 days.   Outpatient follow-up with PCP and pulmonary. -Cultures are negative so far.  Influenza and COVID-19 testing negative on presentation -Strep pneumo urinary antigen negative  History of prior hepatitis C with resultant cirrhosis -Outpatient follow-up with Dr. Comer/ID: Previously on Epclusa  Pancytopenia -Possibly from cirrhosis of liver.  Platelets still 36 today.  Outpatient follow-up.  Hemoglobin stable.  Hypertension -Blood pressure still elevated.  Start amlodipine.  Outpatient follow-up with PCP.  Diabetes mellitus type 2 with hyperglycemia -A1c 7.7 carb modified diet.  Continue home regimen.  Mild hyponatremia--outpatient follow-up  History of IV drug use -Patient apparently reported that he has quit IV drug use for quite some time.  Discharge Instructions   Allergies as of 10/20/2021       Reactions   Atorvastatin Other (See Comments)   Leg cramps and body aches        Medication List     STOP taking these medications    atomoxetine 80 MG capsule Commonly known as: STRATTERA   bacitracin-neomycin-polymyxin-hydrocortisone 1 % ointment Commonly known as: CORTISPORIN   DULoxetine 60 MG capsule Commonly known as: CYMBALTA   Moderna COVID-19 Vaccine 100 MCG/0.5ML injection Generic drug: COVID-19 mRNA vaccine (Moderna)   risperiDONE 2 MG tablet Commonly known as: RISPERDAL       TAKE these medications    amLODipine 5 MG tablet Commonly known as: NORVASC Take 1 tablet (5 mg total) by mouth daily.   cefUROXime 500 MG tablet Commonly known as: CEFTIN Take 1 tablet (500 mg total) by mouth 2 (two) times daily for 5 days.   gabapentin 600 MG tablet Commonly known as: NEURONTIN Take 600 mg by mouth in the morning, at noon, in the evening, and at bedtime.   insulin aspart 100 UNIT/ML injection Commonly known as: novoLOG Inject 2-8 Units into the  skin See admin instructions. Take 2-8 units as needed  Sliding scale.over 250--2 units over 300--4  units over 350--6 units over 400--8 units Up to 50 units daily: For diabetes management   insulin glargine 100 UNIT/ML injection Commonly known as: LANTUS Inject 30 Units into the skin daily.   Iron (Ferrous Sulfate) 325 (65 Fe) MG Tabs Take 325 mg by mouth daily.   Januvia 100 MG tablet Generic drug: sitaGLIPtin Take 100 mg by mouth daily.   sertraline 100 MG tablet Commonly known as: ZOLOFT Take 100 mg by mouth daily.   Sofosbuvir-Velpatasvir 400-100 MG Tabs Commonly known as: Epclusa Take 1 tablet by mouth daily.   traZODone 50 MG tablet Commonly known as: DESYREL Take 2 tablets (100 mg total) by mouth at bedtime as needed for sleep.         Allergies  Allergen Reactions   Atorvastatin Other (See Comments)    Leg cramps and body aches    Consultations: None   Procedures/Studies: DG Chest 2 View  Result Date: 10/18/2021 CLINICAL DATA:  Coughing up blood. EXAM: CHEST - 2 VIEW COMPARISON:  June 14, 2021 FINDINGS: The heart size and mediastinal contours are within normal limits. Both lungs are clear. The visualized skeletal structures are unremarkable. IMPRESSION: No active cardiopulmonary disease. Electronically Signed   By: Gerome Sam III M.D.   On: 10/18/2021 15:24   CT Angio Chest PE W and/or Wo Contrast  Result Date: 10/18/2021 CLINICAL DATA:  High concern for pulmonary embolism. Hemoptysis and shortness of breath. EXAM: CT ANGIOGRAPHY CHEST WITH CONTRAST TECHNIQUE: Multidetector CT imaging of the chest was performed using the standard protocol during bolus administration of intravenous contrast. Multiplanar CT image reconstructions and MIPs were obtained to evaluate the vascular anatomy. CONTRAST:  110mL OMNIPAQUE IOHEXOL 350 MG/ML SOLN COMPARISON:  None. FINDINGS: Cardiovascular: No filling defects within the pulmonary arteries to suggest acute pulmonary embolism. Coronary artery calcification and aortic atherosclerotic calcification. Mediastinum/Nodes:  No mediastinal adenopathy. No axillary or supraclavicular adenopathy. Medial to the sternal wound with deformity of the manubrium. Lungs/Pleura: Segmental airspace disease in the anterior aspect of the RIGHT upper lobe with mild nodularity (image 58/7). No pleural fluid. No pneumothorax Upper Abdomen: 6 Limited view of the liver, kidneys, pancreas are unremarkable. Normal adrenal glands. Nodular contour of the liver. Spleen is enlarged. Gallstones noted. Musculoskeletal: No aggressive osseous lesion. Remote sternal fracture with interval healing Review of the MIP images confirms the above findings. IMPRESSION: 1. RIGHT upper lobe nodular airspace disease is most suggestive of pulmonary infection. Pulmonary hemorrhage could have a similar appearance. Combination of infection with hemorrhage could also be considered in patient presenting with hemoptysis. 2. No acute pulmonary embolism. 3. No mediastinal lymphadenopathy. 4. No pleural fluid. 5. Remote healed sternal wound. Electronically Signed   By: Genevive Bi M.D.   On: 10/18/2021 16:59      Subjective: Patient seen and examined at bedside.  Feels better and states that his blood in sputum is improving.  No overnight fever, nausea, vomiting reported.  Feels okay to go home today.  Discharge Exam: Vitals:   10/19/21 2200 10/20/21 0445  BP: (!) 182/114 (!) 174/102  Pulse: 74 82  Resp: 18 18  Temp: 97.8 F (36.6 C) (!) 97.4 F (36.3 C)  SpO2: 100% 99%    General: Pt is alert, awake, not in acute distress.  Currently on room air Cardiovascular: rate controlled, S1/S2 + Respiratory: bilateral decreased breath sounds at bases Abdominal: Soft, NT, ND, bowel sounds +  Extremities: no edema, no cyanosis    The results of significant diagnostics from this hospitalization (including imaging, microbiology, ancillary and laboratory) are listed below for reference.     Microbiology: Recent Results (from the past 240 hour(s))  Resp Panel by  RT-PCR (Flu A&B, Covid) Nasopharyngeal Swab     Status: None   Collection Time: 10/18/21  2:45 PM   Specimen: Nasopharyngeal Swab; Nasopharyngeal(NP) swabs in vial transport medium  Result Value Ref Range Status   SARS Coronavirus 2 by RT PCR NEGATIVE NEGATIVE Final    Comment: (NOTE) SARS-CoV-2 target nucleic acids are NOT DETECTED.  The SARS-CoV-2 RNA is generally detectable in upper respiratory specimens during the acute phase of infection. The lowest concentration of SARS-CoV-2 viral copies this assay can detect is 138 copies/mL. A negative result does not preclude SARS-Cov-2 infection and should not be used as the sole basis for treatment or other patient management decisions. A negative result may occur with  improper specimen collection/handling, submission of specimen other than nasopharyngeal swab, presence of viral mutation(s) within the areas targeted by this assay, and inadequate number of viral copies(<138 copies/mL). A negative result must be combined with clinical observations, patient history, and epidemiological information. The expected result is Negative.  Fact Sheet for Patients:  BloggerCourse.com  Fact Sheet for Healthcare Providers:  SeriousBroker.it  This test is no t yet approved or cleared by the [place] FDA and  has been authorized for detection and/or diagnosis of SARS-CoV-2 by FDA under an Emergency Use Authorization (EUA). This EUA will remain  in effect (meaning this test can be used) for the duration of the COVID-19 declaration under Section 564(b)(1) of the Act, 21 U.S.C.section 360bbb-3(b)(1), unless the authorization is terminated  or revoked sooner.       Influenza A by PCR NEGATIVE NEGATIVE Final   Influenza B by PCR NEGATIVE NEGATIVE Final    Comment: (NOTE) The Xpert Xpress SARS-CoV-2/FLU/RSV plus assay is intended as an aid in the diagnosis of influenza from Nasopharyngeal swab  specimens and should not be used as a sole basis for treatment. Nasal washings and aspirates are unacceptable for Xpert Xpress SARS-CoV-2/FLU/RSV testing.  Fact Sheet for Patients: BloggerCourse.com  Fact Sheet for Healthcare Providers: SeriousBroker.it  This test is not yet approved or cleared by the [place] FDA and has been authorized for detection and/or diagnosis of SARS-CoV-2 by FDA under an Emergency Use Authorization (EUA). This EUA will remain in effect (meaning this test can be used) for the duration of the COVID-19 declaration under Section 564(b)(1) of the Act, 21 U.S.C. section 360bbb-3(b)(1), unless the authorization is terminated or revoked.  Performed at [facility], [address], [place], [place] [address]   Culture, blood (routine x 2)     Status: None (Preliminary result)   Collection Time: 10/18/21  6:12 PM   Specimen: BLOOD  Result Value Ref Range Status   Specimen Description   Final    BLOOD BLOOD LEFT ARM Performed at [facility], [address] [place], [place] [address]    Special Requests   Final    Blood Culture adequate volume BOTTLES DRAWN AEROBIC AND ANAEROBIC Performed at [facility], [address], [place], [place] [address]    Culture   Final    NO GROWTH < 24 HOURS Performed at [facility], [address] [place], [place] [address]    Report Status PENDING  Incomplete  Culture, blood (routine x 2)  Status: None (Preliminary result)   Collection Time: 10/18/21  6:15 PM   Specimen: BLOOD  Result Value Ref Range Status   Specimen Description   Final    BLOOD BLOOD LEFT FOREARM Performed at [facility], [address] [place], [place] [address]    Special Requests   Final    Blood Culture adequate volume BOTTLES DRAWN AEROBIC AND ANAEROBIC Performed at [facility],  [address], [place], [place] [address]    Culture   Final    NO GROWTH < 24 HOURS Performed at [facility], [address], [place], [place] [address]    Report Status PENDING  Incomplete  Expectorated Sputum Assessment w Gram Stain, Rflx to Resp Cult     Status: None (Preliminary result)   Collection Time: 10/19/21  8:20 PM   Specimen: Sputum  Result Value Ref Range Status   Specimen Description SPUTUM  Final   Special Requests NONE  Final   Sputum evaluation   Final    Sputum specimen not acceptable for testing.  Please recollect.   RESULT CALLED TO, READ BACK BY AND VERIFIED WITH: RN MELAINE 10/19/21@22 :45 BY TW Performed at [facility], [address] [place], [place] [address]    Report Status PENDING  Incomplete     Labs: BNP (last 3 results) Recent Labs    06/14/21 1735  BNP 28.2   Basic Metabolic Panel: Recent Labs  Lab 10/18/21 1445 10/19/21 0312  NA 134* 134*  K 4.1 3.9  CL 102 103  CO2 25 24  GLUCOSE 293* 178*  BUN 17 16  CREATININE 0.98 1.19  CALCIUM 8.8* 8.8*   Liver Function Tests: Recent Labs  Lab 10/18/21 1445 10/19/21 0312  AST 26 28  ALT 26 30  ALKPHOS 78 88  BILITOT 0.6 0.5  PROT 7.1 6.5  ALBUMIN 4.2 3.5   No results for input(s): LIPASE, AMYLASE in the last 168 hours. No results for input(s): AMMONIA in the last 168 hours. CBC: Recent Labs  Lab 10/18/21 1445 10/19/21 0312  WBC 2.9* 2.7*  NEUTROABS 2.1  --   HGB 11.4* 11.4*  HCT 32.9* 32.6*  MCV 85.9 86.7  PLT 36* 36*   Cardiac Enzymes: No results for input(s): CKTOTAL, CKMB, CKMBINDEX, TROPONINI in the last 168 hours. BNP: Invalid input(s): POCBNP CBG: Recent Labs  Lab 10/19/21 0645 10/19/21 1121 10/19/21 1653 10/19/21 2157 10/20/21 0653  GLUCAP 199* 254* 138* 198* 167*   D-Dimer No results for input(s): DDIMER in the last 72 hours. Hgb A1c Recent Labs    10/19/21 0312  HGBA1C 7.7*   Lipid Profile No results for input(s): CHOL, HDL,  LDLCALC, TRIG, CHOLHDL, LDLDIRECT in the last 72 hours. Thyroid function studies No results for input(s): TSH, T4TOTAL, T3FREE, THYROIDAB in the last 72 hours.  Invalid input(s): FREET3 Anemia work up No results for input(s): VITAMINB12, FOLATE, FERRITIN, TIBC, IRON, RETICCTPCT in the last 72 hours. Urinalysis    Component Value Date/Time   COLORURINE YELLOW 10/02/2020 2214   APPEARANCEUR CLEAR 10/02/2020 2214   LABSPEC 1.021 10/02/2020 2214   PHURINE 5.0 10/02/2020 2214   GLUCOSEU 150 (A) 10/02/2020 2214   HGBUR NEGATIVE 10/02/2020 2214   BILIRUBINUR NEGATIVE 10/02/2020 2214   KETONESUR NEGATIVE 10/02/2020 2214   PROTEINUR NEGATIVE 10/02/2020 2214   NITRITE NEGATIVE 10/02/2020 2214   LEUKOCYTESUR NEGATIVE 10/02/2020 2214   Sepsis Labs Invalid input(s): PROCALCITONIN,  WBC,  LACTICIDVEN Microbiology Recent Results (from the past  240 hour(s))  Resp Panel by RT-PCR (Flu A&B, Covid) Nasopharyngeal Swab     Status: None   Collection Time: 10/18/21  2:45 PM   Specimen: Nasopharyngeal Swab; Nasopharyngeal(NP) swabs in vial transport medium  Result Value Ref Range Status   SARS Coronavirus 2 by RT PCR NEGATIVE NEGATIVE Final    Comment: (NOTE) SARS-CoV-2 target nucleic acids are NOT DETECTED.  The SARS-CoV-2 RNA is generally detectable in upper respiratory specimens during the acute phase of infection. The lowest concentration of SARS-CoV-2 viral copies this assay can detect is 138 copies/mL. A negative result does not preclude SARS-Cov-2 infection and should not be used as the sole basis for treatment or other patient management decisions. A negative result may occur with  improper specimen collection/handling, submission of specimen other than nasopharyngeal swab, presence of viral mutation(s) within the areas targeted by this assay, and inadequate number of viral copies(<138 copies/mL). A negative result must be combined with clinical observations, patient history, and  epidemiological information. The expected result is Negative.  Fact Sheet for Patients:  BloggerCourse.com  Fact Sheet for Healthcare Providers:  SeriousBroker.it  This test is no t yet approved or cleared by the [place] FDA and  has been authorized for detection and/or diagnosis of SARS-CoV-2 by FDA under an Emergency Use Authorization (EUA). This EUA will remain  in effect (meaning this test can be used) for the duration of the COVID-19 declaration under Section 564(b)(1) of the Act, 21 U.S.C.section 360bbb-3(b)(1), unless the authorization is terminated  or revoked sooner.       Influenza A by PCR NEGATIVE NEGATIVE Final   Influenza B by PCR NEGATIVE NEGATIVE Final    Comment: (NOTE) The Xpert Xpress SARS-CoV-2/FLU/RSV plus assay is intended as an aid in the diagnosis of influenza from Nasopharyngeal swab specimens and should not be used as a sole basis for treatment. Nasal washings and aspirates are unacceptable for Xpert Xpress SARS-CoV-2/FLU/RSV testing.  Fact Sheet for Patients: BloggerCourse.com  Fact Sheet for Healthcare Providers: SeriousBroker.it  This test is not yet approved or cleared by the [place] FDA and has been authorized for detection and/or diagnosis of SARS-CoV-2 by FDA under an Emergency Use Authorization (EUA). This EUA will remain in effect (meaning this test can be used) for the duration of the COVID-19 declaration under Section 564(b)(1) of the Act, 21 U.S.C. section 360bbb-3(b)(1), unless the authorization is terminated or revoked.  Performed at [facility], [address], [place], [place] [address]   Culture, blood (routine x 2)     Status: None (Preliminary result)   Collection Time: 10/18/21  6:12 PM   Specimen: BLOOD  Result Value Ref Range Status   Specimen Description   Final    BLOOD BLOOD LEFT  ARM Performed at [facility], [address], [place], [place] [address]    Special Requests   Final    Blood Culture adequate volume BOTTLES DRAWN AEROBIC AND ANAEROBIC Performed at [facility], [address], [place], [place] [address]    Culture   Final    NO GROWTH < 24 HOURS Performed at [facility], [address], [place], [place] [address]    Report Status PENDING  Incomplete  Culture, blood (routine x 2)     Status: None (Preliminary result)   Collection Time: 10/18/21  6:15 PM   Specimen: BLOOD  Result Value Ref Range Status   Specimen Description   Final    BLOOD BLOOD LEFT FOREARM  Performed at [facility], [address], [place], [place] [address]    Special Requests   Final    Blood Culture adequate volume BOTTLES DRAWN AEROBIC AND ANAEROBIC Performed at [facility], [address], [place], [place] [address]    Culture   Final    NO GROWTH < 24 HOURS Performed at [facility], [address] [place], [place] [address]    Report Status PENDING  Incomplete  Expectorated Sputum Assessment w Gram Stain, Rflx to Resp Cult     Status: None (Preliminary result)   Collection Time: 10/19/21  8:20 PM   Specimen: Sputum  Result Value Ref Range Status   Specimen Description SPUTUM  Final   Special Requests NONE  Final   Sputum evaluation   Final    Sputum specimen not acceptable for testing.  Please recollect.   RESULT CALLED TO, READ BACK BY AND VERIFIED WITH: RN MELAINE 10/19/21@22 :45 BY TW Performed at [facility], [address] [place], [place] [address]    Report Status PENDING  Incomplete     Time coordinating discharge: 35 minutes  SIGNED:   Glade Lloyd, MD  [facility] 10/20/2021, 7:11 AM

## 2021-10-22 ENCOUNTER — Other Ambulatory Visit (HOSPITAL_COMMUNITY): Payer: Self-pay | Admitting: Psychiatry

## 2021-10-22 DIAGNOSIS — F333 Major depressive disorder, recurrent, severe with psychotic symptoms: Secondary | ICD-10-CM

## 2021-10-22 LAB — LEGIONELLA PNEUMOPHILA SEROGP 1 UR AG: L. pneumophila Serogp 1 Ur Ag: NEGATIVE

## 2021-10-23 LAB — CULTURE, BLOOD (ROUTINE X 2)
Culture: NO GROWTH
Culture: NO GROWTH
Special Requests: ADEQUATE
Special Requests: ADEQUATE

## 2021-10-23 LAB — EXPECTORATED SPUTUM ASSESSMENT W GRAM STAIN, RFLX TO RESP C

## 2021-10-30 ENCOUNTER — Encounter (HOSPITAL_COMMUNITY): Payer: Self-pay | Admitting: Psychiatry

## 2021-10-30 ENCOUNTER — Telehealth (INDEPENDENT_AMBULATORY_CARE_PROVIDER_SITE_OTHER): Payer: Medicaid Other | Admitting: Psychiatry

## 2021-10-30 DIAGNOSIS — F321 Major depressive disorder, single episode, moderate: Secondary | ICD-10-CM | POA: Diagnosis not present

## 2021-10-30 MED ORDER — TRAZODONE HCL 50 MG PO TABS: 100.0000 mg | ORAL_TABLET | Freq: Every evening | ORAL | 3 refills | Status: DC | PRN

## 2021-10-30 NOTE — Progress Notes (Signed)
BH MD/PA/NP OP Progress Note Virtual Visit via Telephone Note  I connected with Chase Reed on 10/30/21 at  9:00 AM EST by telephone and verified that I am speaking with the correct person using two identifiers.  Location: Patient: home Provider: Clinic   I discussed the limitations, risks, security and privacy concerns of performing an evaluation and management service by telephone and the availability of in person appointments. I also discussed with the patient that there may be a patient responsible charge related to this service. The patient expressed understanding and agreed to proceed.   I provided 30 minutes of non-face-to-face time during this encounter.    10/30/2021 11:47 AM Chase Reed  MRN:  [id number] Rts detox going home Chief Complaint: "I have been sober for 102 days but now things are worse"  HPI: 55 year old male in today for follow up psychiatric evaluation.    He has a psychiatric history of substance-induced mood disorder, substance induced psychotic disorders with hallucinations, opioid use disorder, and depression. He was recently hospitalized on 10/18/2021-10/20/2021 for pneumonia. While hospiatlized his Cymbalta, Strattera, and Risperdal were discontinued. Patient is  currently managed on trazodone 50 mg, and gabapentin 600 mg three times daily (precribed by PCP for neuropathy), and Zoloft 100 mg (filled by PCP).  Patient notes that his medications are effective in managing his psychiatric conditions.    Today patient ws unable to login virtually so his assessment was done over the phone. During exam he was pleasant, cooperative, and engaged in conversation. He notes that he has maintained his sobriety for 102 days however notes that things are worse. Recently he was admitted to the hospital with pneumonia. He also notes that he has been unable to work due to his physical pain. He reports that daily his pain level is a 5/10. He notes that his neuropathy has  worsened over time and notes that he has bodily pain. Recently he notes that his sleep has been poor noting that he sleeps 4-5 hours of sleep which is disturbed. He also notes that with his diabetes he has increased urination and hunger. Most days he notes that his blood pressure has been elevated. He however notes that his falling has improved.  Patient notes that he has been more irritable. He notes that he gets irritated in his AA meetings. He also noting that he is distractible and has racing thoughts. Patient denies other symptoms of mania.  He notes that he has not hallucinated since October. He describe what he saw as ghost of a child and a male.    Patient notes that the above worsens his anxiety and depression.  Provider conducted a GAD 7 and patient scored a  19, at his last visit he scored a 13. Provider also conducted a PHQ 9 and patient scored am 18, at his last visit he scored a 20 20. He endorses passive SI but denies wanting to harm himself.   Patient notes that he disliked how he felt on Cymbalta. At this time Cymbalta, Strattera, and Risperdal not restarted. Patient notes that he is able to cope with the above and requested his medications not be adjusted.  No medication changed made today. Patient agreeable to continue medications as prescribed.  No other concerns noted at this time.    Visit Diagnosis:    ICD-10-CM   1. Moderate major depression (HCC)  F32.1 traZODone (DESYREL) 50 MG tablet      Past Psychiatric History:  He has a psychiatric history  of substance-induced mood disorder, substance induced psychotic disorders with hallucinations, opioid use disorder, and depression.  Past Medical History:  Past Medical History:  Diagnosis Date   Cirrhosis (HCC)    Diabetes mellitus without complication (HCC)    Hepatitis     Past Surgical History:  Procedure Laterality Date   APPLICATION OF WOUND VAC N/A 12/24/2018   Procedure: APPLICATION OF WOUND VAC;  Surgeon: Alleen Borne, MD;  Location: [facility] OR;  Service: Thoracic;  Laterality: N/A;   APPLICATION OF WOUND VAC N/A 12/27/2018   Procedure: WOUND VAC CHANGE;  Surgeon: Alleen Borne, MD;  Location: [facility] OR;  Service: Thoracic;  Laterality: N/A;   APPLICATION OF WOUND VAC N/A 01/14/2019   Procedure: APPLICATION OF WOUND VAC;  Surgeon: Alleen Borne, MD;  Location: [facility];  Service: Vascular;  Laterality: N/A;   I & D EXTREMITY N/A 01/14/2019   Procedure: DEBRIDEMENT OF STERNOCLAVICULAR JOINT;  Surgeon: Alleen Borne, MD;  Location: [facility] OR;  Service: Vascular;  Laterality: N/A;   I & D EXTREMITY Right 10/03/2020   Procedure: Irrigation debridement of RIGHT forearm subfascial abscess measuring approximately 6 x 3 x 1.5 cm;  Surgeon: Ernest Mallick, MD;  Location: [facility] OR;  Service: Orthopedics;  Laterality: Right;   LITHOTRIPSY     STERNAL WOUND DEBRIDEMENT N/A 12/24/2018   Procedure: I&D CHEST WALL ABSCESS;  Surgeon: Alleen Borne, MD;  Location: [facility];  Service: Thoracic;  Laterality: N/A;   STERNAL WOUND DEBRIDEMENT N/A 12/27/2018   Procedure: DEBRIDEMENT OF CHEST WALL WOUND;  Surgeon: Alleen Borne, MD;  Location: [facility];  Service: Thoracic;  Laterality: N/A;   TEE WITHOUT CARDIOVERSION N/A 01/03/2019   Procedure: TRANSESOPHAGEAL ECHOCARDIOGRAM (TEE);  Surgeon: Thurmon Fair, MD;  Location[facility] ENDOSCOPY;  Service: Cardiovascular;  Laterality: N/A;    Family Psychiatric History: Unknown  Family History:  Family History  Problem Relation Age of Onset   COPD Father     Social History:  Social History   Socioeconomic History   Marital status: Married    Spouse name: Not on file   Number of children: Not on file   Years of education: Not on file   Highest education level: Not on file  Occupational History   Occupation: Building Education administrator  Tobacco Use   Smoking status: Some Days    Packs/day: 0.25    Years: 30.00    Pack years: 7.50    Types: Cigarettes   Smokeless tobacco: Former  Water quality scientist Use: Never used  Substance and Sexual Activity   Alcohol use: No   Drug use: Yes    Types: Heroin, IV    Comment: Fentanyl and "Oxy" per patient    Sexual activity: Never  Other Topics Concern   Not on file  Social History Narrative   Not on file   Social Determinants of Health   Financial Resource Strain: Not on file  Food Insecurity: Not on file  Transportation Needs: Not on file  Physical Activity: Not on file  Stress: Not on file  Social Connections: Not on file    Allergies:  Allergies  Allergen Reactions   Atorvastatin Other (See Comments)    Leg cramps and body aches    Metabolic Disorder Labs: Lab Results  Component Value Date   HGBA1C 7.7 (H) 10/19/2021   MPG 174.29 10/19/2021   MPG 174.29 10/04/2020   No results found for: PROLACTIN No results found for: CHOL,  TRIG, HDL, CHOLHDL, VLDL, LDLCALC Lab Results  Component Value Date   TSH 1.957 09/20/2019    Therapeutic Level Labs: No results found for: LITHIUM No results found for: VALPROATE No components found for:  CBMZ  Current Medications: Current Outpatient Medications  Medication Sig Dispense Refill   amLODipine (NORVASC) 5 MG tablet Take 1 tablet (5 mg total) by mouth daily. 30 tablet 0   DULoxetine (CYMBALTA) 60 MG capsule Take 1 capsule (60 mg total) by mouth daily. 30 capsule 3   gabapentin (NEURONTIN) 600 MG tablet Take 600 mg by mouth in the morning, at noon, in the evening, and at bedtime.      insulin aspart (NOVOLOG) 100 UNIT/ML injection Inject 2-8 Units into the skin See admin instructions. Take 2-8 units as needed  Sliding scale.over 250--2 units over 300--4 units over 350--6 units over 400--8 units Up to 50 units daily: For diabetes management     insulin glargine (LANTUS) 100 UNIT/ML injection Inject 30 Units into the skin daily.      Iron, Ferrous Sulfate, 325 (65 Fe) MG TABS Take 325 mg by mouth daily. (Patient not taking: Reported on 10/19/2021) 90 tablet 2    JANUVIA 100 MG tablet Take 100 mg by mouth daily.     sertraline (ZOLOFT) 100 MG tablet Take 100 mg by mouth daily.     Sofosbuvir-Velpatasvir (EPCLUSA) 400-100 MG TABS Take 1 tablet by mouth daily. (Patient not taking: Reported on 10/19/2021) 28 tablet 2   traZODone (DESYREL) 50 MG tablet Take 2 tablets (100 mg total) by mouth at bedtime as needed for sleep. 60 tablet 3   No current facility-administered medications for this visit.     Musculoskeletal: Strength & Muscle Tone:  Unable to assess. Patient unable to log in virtually Gait & Station:  Unable to assess. Patient unable to log in virtually Patient leans: N/A  Psychiatric Specialty Exam: Review of Systems  There were no vitals taken for this visit.There is no height or weight on file to calculate BMI.  General Appearance:  Unable to assess. Patient unable to log in virtually  Eye Contact:   Unable to assess. Patient unable to log in virtually  Speech:  Clear and Coherent and Normal Rate  Volume:  Normal  Mood:  Anxious and Depressed  Affect:  Congruent  Thought Process:  Coherent, Goal Directed and Linear  Orientation:  Full (Time, Place, and Person)  Thought Content: WDL and Logical   Suicidal Thoughts:  Yes.  without intent/plan  Homicidal Thoughts:  No  Memory:  Immediate;   Good Recent;   Good Remote;   Good  Judgement:  Good  Insight:  Good  Psychomotor Activity:  Normal  Concentration:  Concentration: Good and Attention Span: Good  Recall:  Good  Fund of Knowledge: Good  Language: Good  Akathisia:  No  Handed:  Right  AIMS (if indicated):Not done  Assets:  Communication Skills Desire for Improvement Housing Social Support  ADL's:  Intact  Cognition: WNL  Sleep:  Fair   Screenings: AIMS    Flowsheet Row [facility] (Dis[facility] Admission (Discharged) from [facility]   AIMS Total Score 0 0      AUDIT    Flowsheet Row Admission (Discharged) [facility]  Alcohol Use Disorder Identification Test Final Score (AUDIT) 0  GAD-7    Flowsheet Row Video Visit from 10/30/2021 in [facility] Video Visit from 07/29/2021 in [facility] Counselor from 05/03/2020 in [facility]  Total GAD-7 Score 19 13 14       PHQ2-9    Flowsheet Row Video Visit from 10/30/2021 in [facility] Video Visit from 07/29/2021 in [facility] Counselor from 05/03/2020 in [facility] Office Visit from 09/21/2019 in [facility] Office Visit from 02/03/2019 in [facility]  PHQ-2 Total Score 3 6 4  0 6  PHQ-9 Total Score 18 20 16  -- --      Flowsheet Row Video Visit from 10/30/2021 in [facility] ED to Hosp-Admission (Discharged) from 10/18/2021 in [facility] Video Visit from 07/29/2021 in [facility]  C-SSRS RISK CATEGORY Error: Q7 should not be populated when Q6 is No No Risk Error: Q7 should not be populated when Q6 is No        Assessment and Plan: Patient endorses symptoms of  anxiety and depression due to his health and life stressors. Risperdal, Cymbalta, and Starttera discontinued while patient was hospitalized. Patient notes that he disliked how he felt on Cymbalta. At this time Cymbalta, Strattera, and Risperdal not restarted. Patient notes that he is able to cope with the above and requested his medications not be adjusted.  No medication changed made today. Patient agreeable to continue medications as prescribed  1. Moderate major depression (HCC)  Continue- traZODone (DESYREL) 50 MG tablet;  Take 2 tablets (100 mg total) by mouth at bedtime as needed for sleep.  Dispense: 60 tablet; Refill: 3   Follow up in 3 months  Follow-up in 3 months  [facility], NP 10/30/2021, 11:47 AM

## 2021-11-01 IMAGING — US US ABDOMEN LIMITED
1 series · 14 of 25 positions shown · non-contrast
Comparison: CT abdomen dated December 03, 2020.

CLINICAL DATA: Chronic hepatitis-C.

EXAM:
ULTRASOUND ABDOMEN LIMITED RIGHT UPPER QUADRANT

[Series 1: us abdomen limited · 0.22mm/px · 14 of 47 slices shown]
[im 1/47]
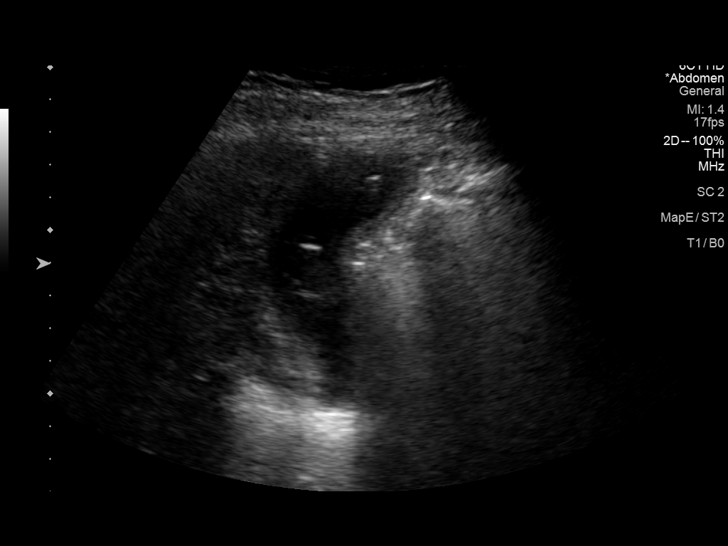
[im 4/47]
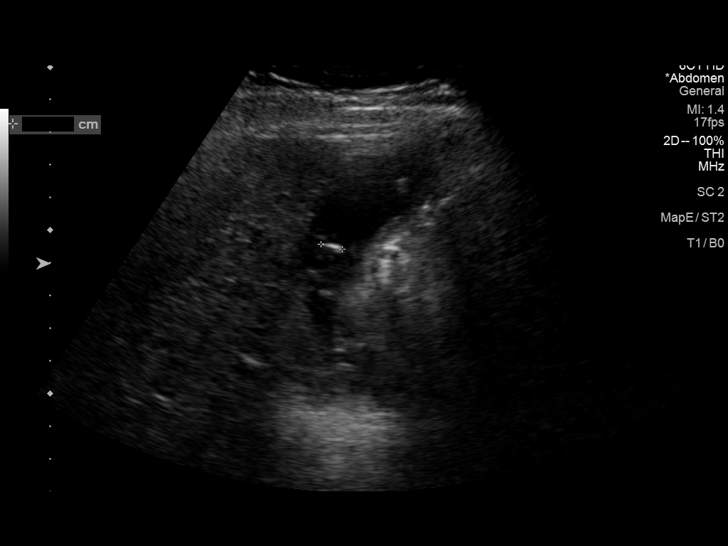
[im 8/47]
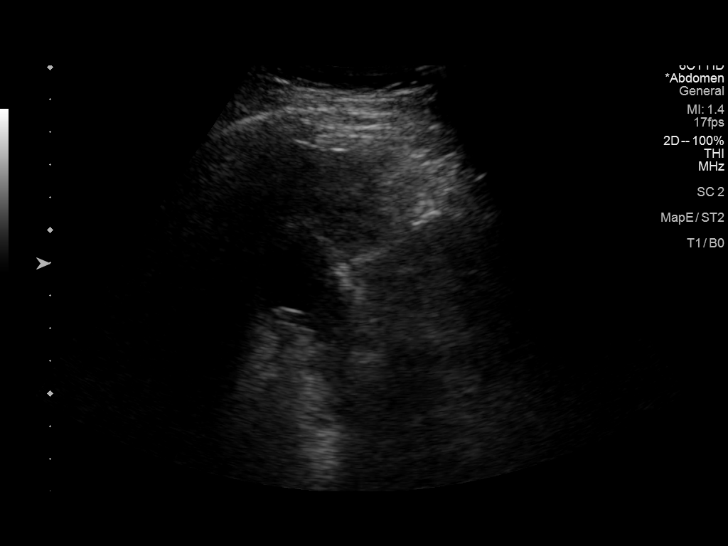
[im 12/47]
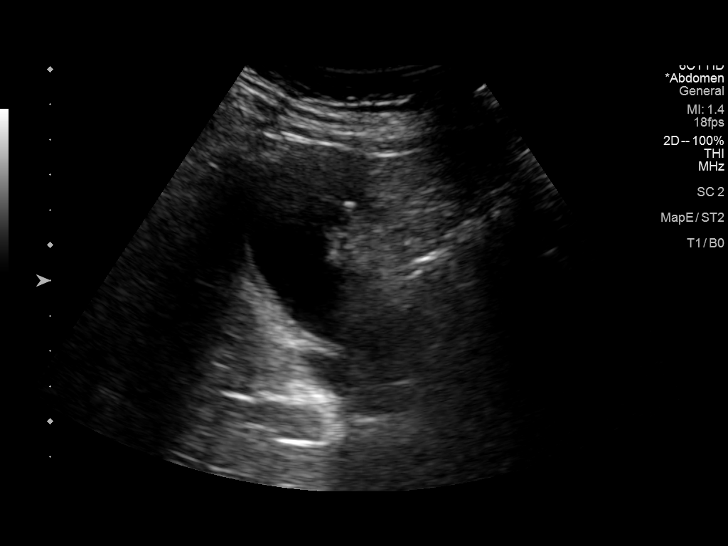
[im 16/47]
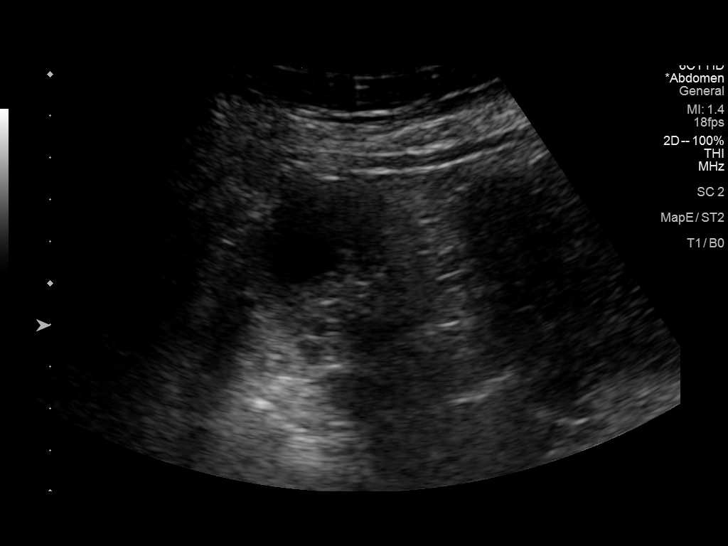
[im 18/47]
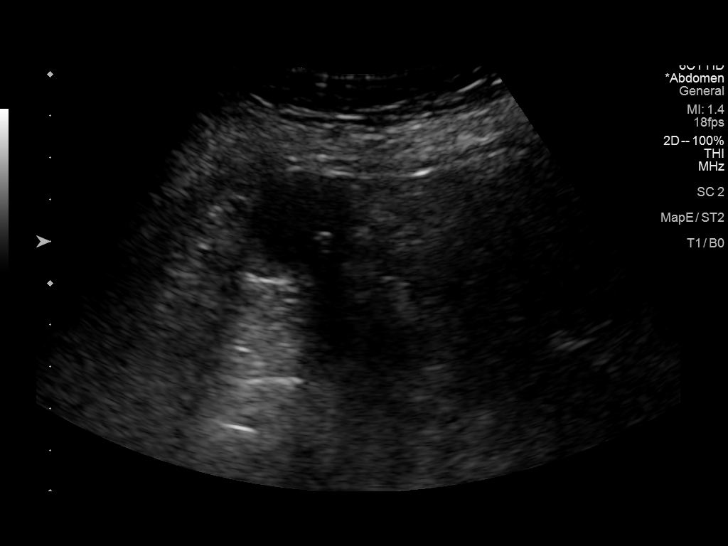
[im 22/47]
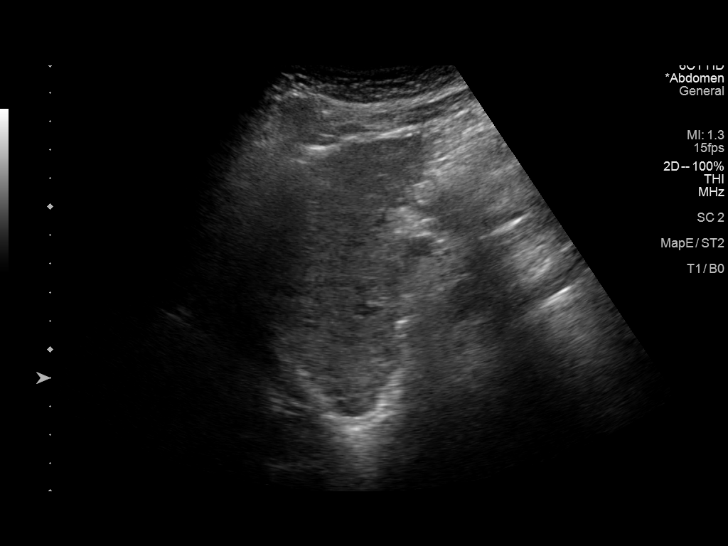
[im 25/47]
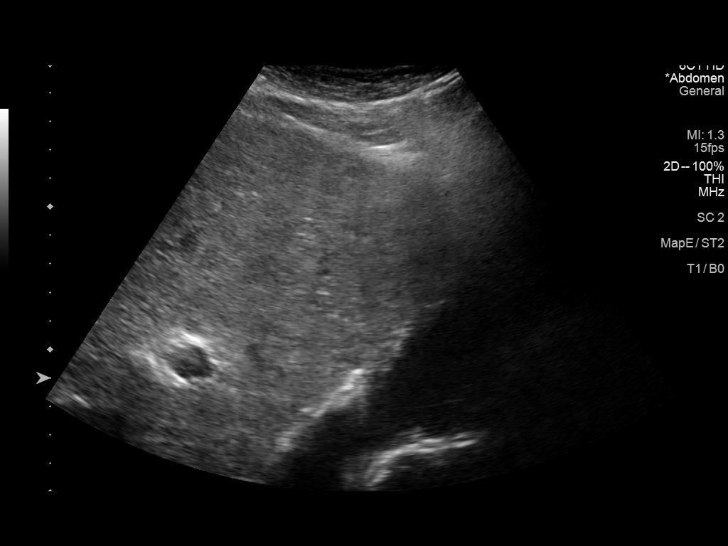
[im 29/47]
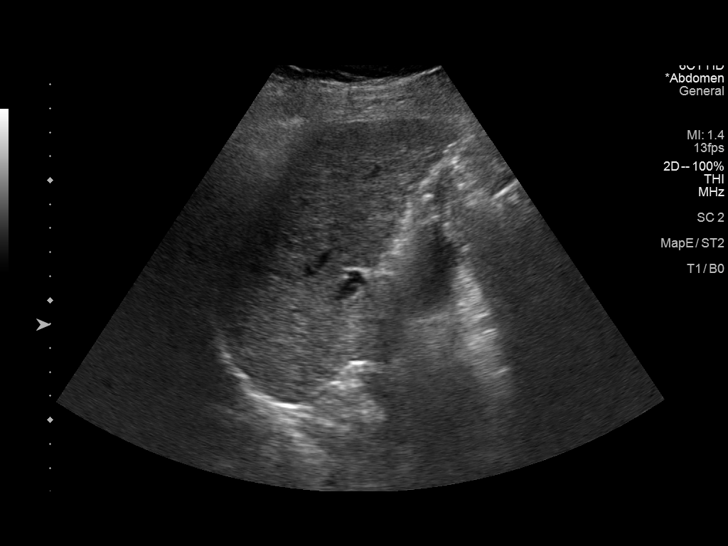
[im 31/47]
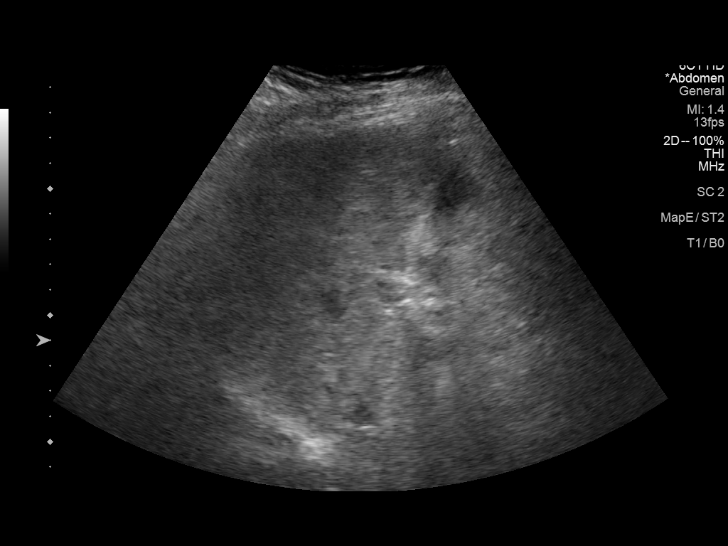
[im 35/47]
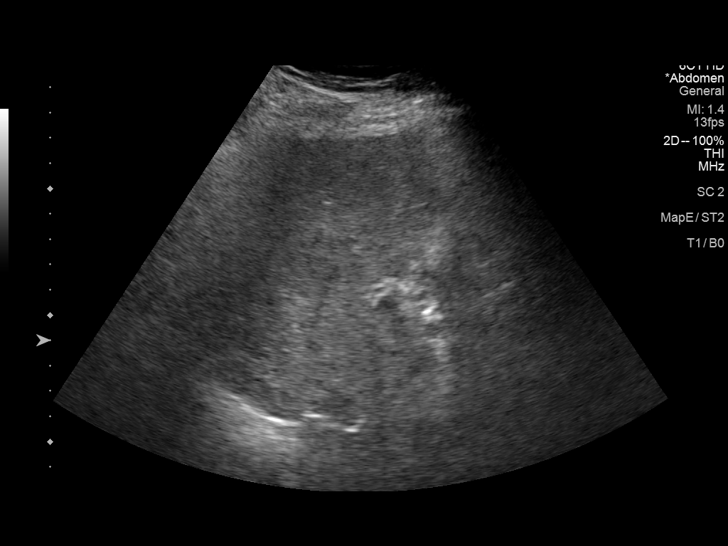
[im 39/47]
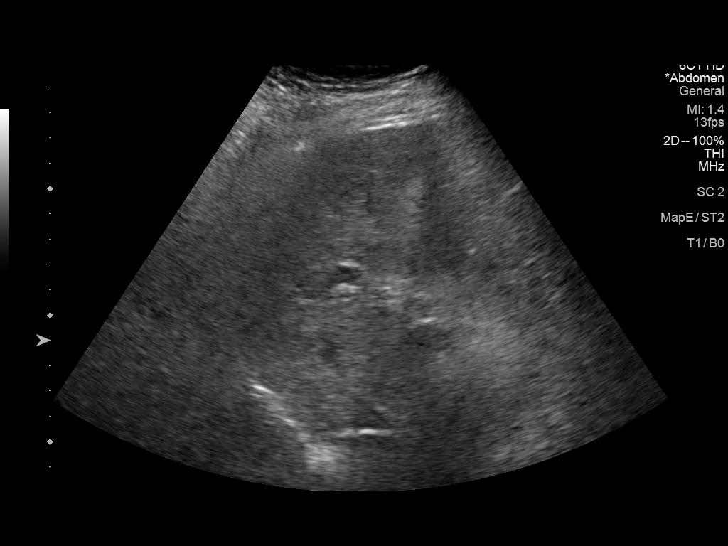
[im 43/47]
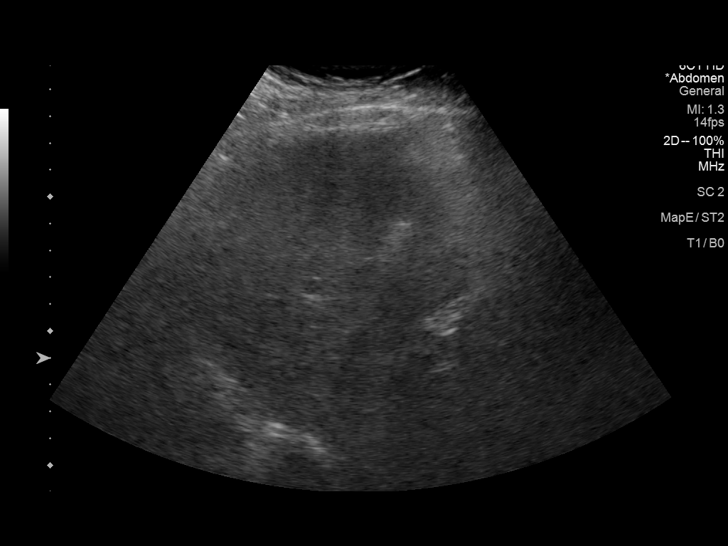
[im 47/47]
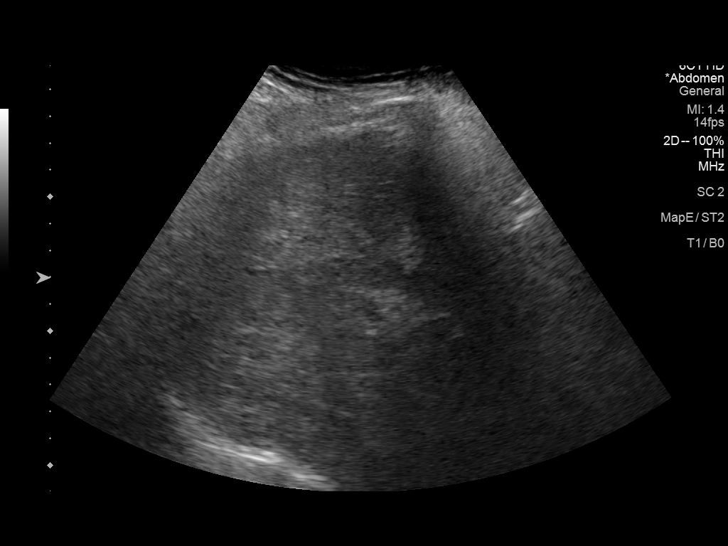

[14 of 25 positions shown; findings below may reference images not displayed]

FINDINGS: Gallbladder:

Unchanged small gallstones. No wall thickening visualized. No
sonographic Murphy sign noted by sonographer.

Common bile duct:

Diameter: 8 mm, at the upper limits of normal.

Liver:

No focal lesion identified. Unchanged nodular contour with coarsened
heterogeneously increased parenchymal echogenicity. Portal vein is
patent on color Doppler imaging with normal direction of blood flow
towards the liver.

Other: None.
IMPRESSION: 1. Unchanged cirrhosis. No focal liver lesion.
2. Unchanged cholelithiasis.

## 2021-11-06 LAB — QUANTIFERON-TB GOLD PLUS (RQFGPL)
QuantiFERON Mitogen Value: 0.59 IU/mL
QuantiFERON Nil Value: 0.05 IU/mL
QuantiFERON TB1 Ag Value: 0.03 IU/mL
QuantiFERON TB2 Ag Value: 0.03 IU/mL

## 2021-11-06 LAB — QUANTIFERON-TB GOLD PLUS: QuantiFERON-TB Gold Plus: NEGATIVE

## 2021-11-07 ENCOUNTER — Other Ambulatory Visit: Payer: Self-pay | Admitting: Orthopedic Surgery

## 2021-11-07 DIAGNOSIS — M25521 Pain in right elbow: Secondary | ICD-10-CM

## 2021-11-08 ENCOUNTER — Other Ambulatory Visit: Payer: Self-pay

## 2021-11-08 ENCOUNTER — Ambulatory Visit
Admission: RE | Admit: 2021-11-08 | Discharge: 2021-11-08 | Disposition: A | Discharge status: Home / Self Care | Payer: Medicaid Other | Source: Ambulatory Visit | Attending: Orthopedic Surgery | Admitting: Orthopedic Surgery

## 2021-11-08 DIAGNOSIS — M25521 Pain in right elbow: Secondary | ICD-10-CM

## 2021-11-12 ENCOUNTER — Ambulatory Visit (INDEPENDENT_AMBULATORY_CARE_PROVIDER_SITE_OTHER): Payer: Medicaid Other | Admitting: Pulmonary Disease

## 2021-11-12 ENCOUNTER — Encounter: Payer: Self-pay | Admitting: Pulmonary Disease

## 2021-11-12 ENCOUNTER — Other Ambulatory Visit: Payer: Self-pay

## 2021-11-12 VITALS — BP 140/84 | HR 89 | Temp 98.0°F | Ht 72.0 in | Wt 205.8 lb

## 2021-11-12 DIAGNOSIS — R042 Hemoptysis: Secondary | ICD-10-CM

## 2021-11-12 DIAGNOSIS — J189 Pneumonia, unspecified organism: Secondary | ICD-10-CM | POA: Diagnosis not present

## 2021-11-12 DIAGNOSIS — S42401A Unspecified fracture of lower end of right humerus, initial encounter for closed fracture: Secondary | ICD-10-CM | POA: Insufficient documentation

## 2021-11-12 NOTE — Patient Instructions (Signed)
Nice to meet you  I am glad the cough and the blood got better with antibiotics.  I think you coughed up blood because the platelets were low.  No new medications or medication changes  Return to clinic as needed, if new symptoms or concerning symptoms arise please call our office and let me know

## 2021-11-13 NOTE — Progress Notes (Signed)
@Patient  ID: Chase Reed, male    DOB: 04-13-1966, 56 y.o.   MRN: [id number]  Chief Complaint  Patient presents with   Consult    Consult for hemoptysis. Pt states that he is no longer coughing up blood. He says he is not able to really cough at this time. Finished the antibiotics     Referring provider: Reyes Ivan  HPI:   56 y.o. whom we are seeing in consultation for hemoptysis.  H&P and discharge summary reviewed from 10/2021  Patient was noted health.  Woke up 1 morning.  Felt some congestion in his chest.  Cough.  Spit up bright red blood.  Continues to feel about a teaspoon to tablespoon full.  Present to the ED.  Chest x-ray was clear on my review interpretation.  CTA PE protocol was performed on my review and interpretation revealed no PE, right upper lobe infiltrate consistent with site of hemoptysis.  He was placed on antibiotics for community-acquired pneumonia. it was noted his platelets were 36,000.  INR okay.  Hemoptysis resolved.  He completed a course of oral antibiotics.  Today he feels fine.  No further coughing.  Unfortunately had recent accident at work and dislocated his elbow and fell 10 feet.  Currently in a sling and cast.  PMH: Diabetes, cirrhosis, Surgical history: Wound debridements, lithotripsy Family history: Father with COPD Social history: Current smoker, lives in [employer] [place] / Pulmonary Flowsheets:   ACT:  No flowsheet data found.  MMRC: No flowsheet data found.  Epworth:  No flowsheet data found.  Tests:   FENO:  No results found for: NITRICOXIDE  PFT: No flowsheet data found.  WALK:  No flowsheet data found.  Imaging: Personally reviewed as per EMR discussion this note DG Chest 2 View  Result Date: 10/18/2021 CLINICAL DATA:  Coughing up blood. EXAM: CHEST - 2 VIEW COMPARISON:  June 14, 2021 FINDINGS: The heart size and mediastinal contours are within normal limits. Both lungs are clear.  The visualized skeletal structures are unremarkable. IMPRESSION: No active cardiopulmonary disease. Electronically Signed   By: Dorise Bullion III M.D.   On: 10/18/2021 15:24   CT Angio Chest PE W and/or Wo Contrast  Result Date: 10/18/2021 CLINICAL DATA:  High concern for pulmonary embolism. Hemoptysis and shortness of breath. EXAM: CT ANGIOGRAPHY CHEST WITH CONTRAST TECHNIQUE: Multidetector CT imaging of the chest was performed using the standard protocol during bolus administration of intravenous contrast. Multiplanar CT image reconstructions and MIPs were obtained to evaluate the vascular anatomy. CONTRAST:  65mL OMNIPAQUE IOHEXOL 350 MG/ML SOLN COMPARISON:  None. FINDINGS: Cardiovascular: No filling defects within the pulmonary arteries to suggest acute pulmonary embolism. Coronary artery calcification and aortic atherosclerotic calcification. Mediastinum/Nodes: No mediastinal adenopathy. No axillary or supraclavicular adenopathy. Medial to the sternal wound with deformity of the manubrium. Lungs/Pleura: Segmental airspace disease in the anterior aspect of the RIGHT upper lobe with mild nodularity (image 58/7). No pleural fluid. No pneumothorax Upper Abdomen: 6 Limited view of the liver, kidneys, pancreas are unremarkable. Normal adrenal glands. Nodular contour of the liver. Spleen is enlarged. Gallstones noted. Musculoskeletal: No aggressive osseous lesion. Remote sternal fracture with interval healing Review of the MIP images confirms the above findings. IMPRESSION: 1. RIGHT upper lobe nodular airspace disease is most suggestive of pulmonary infection. Pulmonary hemorrhage could have a similar appearance. Combination of infection with hemorrhage could also be considered in patient presenting with hemoptysis. 2. No acute pulmonary embolism. 3. No mediastinal lymphadenopathy.  4. No pleural fluid. 5. Remote healed sternal wound. Electronically Signed   By: Suzy Bouchard M.D.   On: 10/18/2021 16:59    CT ELBOW RIGHT WO CONTRAST  Result Date: 11/08/2021 CLINICAL DATA:  Right elbow pain after falling 10 feet a week ago. EXAM: CT OF THE UPPER RIGHT EXTREMITY WITHOUT CONTRAST TECHNIQUE: Multidetector CT imaging of the upper right extremity was performed according to the standard protocol. COMPARISON:  Right elbow x-rays dated October 03, 2020. FINDINGS: Bones/Joint/Cartilage Small acute comminuted fracture of the coronoid process with several tiny fracture fragments, one of which is displaced centrally in the ulnotrochlear joint (series 10, image 36). Small linear fracture fragment along the posterior aspect of the lateral condyle, likely a tiny avulsion fracture (series 4, image 42; series 8, image 64). The radial head is intact. No dislocation. Joint spaces are preserved. Small hemarthrosis. Ligaments Ligaments are suboptimally evaluated by CT. Muscles and Tendons Grossly intact.  No muscle atrophy. Soft tissue Mild posterior elbow soft tissue swelling. No fluid collection or hematoma. No soft tissue mass. IMPRESSION: 1. Small acute comminuted fracture of the coronoid process as described above. 2. Small linear fracture fragment along the posterior aspect of the lateral condyle, likely a tiny avulsion fracture. 3. Small hemarthrosis. Electronically Signed   By: Titus Dubin M.D.   On: 11/08/2021 10:56    Lab Results: Personally reviewed CBC    Component Value Date/Time   WBC 2.7 (L) 10/19/2021 0312   RBC 3.76 (L) 10/19/2021 0312   HGB 11.4 (L) 10/19/2021 0312   HGB 9.2 (L) 07/16/2021 1017   HCT 32.6 (L) 10/19/2021 0312   PLT 36 (L) 10/19/2021 0312   PLT 40 (L) 07/16/2021 1017   MCV 86.7 10/19/2021 0312   MCH 30.3 10/19/2021 0312   MCHC 35.0 10/19/2021 0312   RDW 13.5 10/19/2021 0312   LYMPHSABS 0.4 (L) 10/18/2021 1445   MONOABS 0.2 10/18/2021 1445   EOSABS 0.2 10/18/2021 1445   BASOSABS 0.0 10/18/2021 1445    BMET    Component Value Date/Time   NA 134 (L) 10/19/2021 0312   K 3.9  10/19/2021 0312   CL 103 10/19/2021 0312   CO2 24 10/19/2021 0312   GLUCOSE 178 (H) 10/19/2021 0312   BUN 16 10/19/2021 0312   CREATININE 1.19 10/19/2021 0312   CREATININE 1.63 (H) 07/16/2021 1017   CREATININE 1.35 (H) 01/08/2021 1145   CALCIUM 8.8 (L) 10/19/2021 0312   GFRNONAA >60 10/19/2021 0312   GFRNONAA 50 (L) 07/16/2021 1017   GFRNONAA 60 01/08/2021 1145   GFRAA 69 01/08/2021 1145    BNP    Component Value Date/Time   BNP 28.2 06/14/2021 1735    ProBNP No results found for: PROBNP  Specialty Problems       Pulmonary Problems   Community acquired pneumonia   Hemoptysis    Allergies  Allergen Reactions   Atorvastatin Other (See Comments)    Leg cramps and body aches    Immunization History  Administered Date(s) Administered   Moderna Sars-Covid-2 Vaccination 05/04/2020, 06/05/2020, 12/10/2020   Pneumococcal Polysaccharide-23 07/11/2015    Past Medical History:  Diagnosis Date   Cirrhosis ([place])    Diabetes mellitus without complication ([place])    Hepatitis     Tobacco History: Social History   Tobacco Use  Smoking Status Some Days   Packs/day: 0.25   Years: 30.00   Pack years: 7.50   Types: Cigarettes  Smokeless Tobacco Former   Ready to quit: Not Answered  Counseling given: Not Answered   Continue to not smoke  Outpatient Encounter Medications as of 11/12/2021  Medication Sig   amLODipine (NORVASC) 5 MG tablet Take 1 tablet (5 mg total) by mouth daily.   DULoxetine (CYMBALTA) 60 MG capsule Take 1 capsule (60 mg total) by mouth daily.   gabapentin (NEURONTIN) 600 MG tablet Take 600 mg by mouth in the morning, at noon, in the evening, and at bedtime.    insulin aspart (NOVOLOG) 100 UNIT/ML injection Inject 2-8 Units into the skin See admin instructions. Take 2-8 units as needed  Sliding scale.over 250--2 units over 300--4 units over 350--6 units over 400--8 units Up to 50 units daily: For diabetes management   insulin glargine (LANTUS) 100  UNIT/ML injection Inject 30 Units into the skin daily.    Iron, Ferrous Sulfate, 325 (65 Fe) MG TABS Take 325 mg by mouth daily.   JANUVIA 100 MG tablet Take 100 mg by mouth daily.   sertraline (ZOLOFT) 100 MG tablet Take 100 mg by mouth daily.   Sofosbuvir-Velpatasvir (EPCLUSA) 400-100 MG TABS Take 1 tablet by mouth daily.   traZODone (DESYREL) 50 MG tablet Take 2 tablets (100 mg total) by mouth at bedtime as needed for sleep.   No facility-administered encounter medications on file as of 11/12/2021.     Review of Systems  Review of Systems  No chest pain with exertion.  No orthopnea or PND.  No lower extremity swelling.  Comprehensive review of systems otherwise negative. Physical Exam  BP 140/84 (BP Location: Left Arm, Patient Position: Sitting, Cuff Size: Normal)    Pulse 89    Temp 98 F (36.7 C) (Oral)    Ht 6' (1.829 m)    Wt 205 lb 12.8 oz (93.4 kg)    SpO2 98%    BMI 27.91 kg/m   Wt Readings from Last 5 Encounters:  11/12/21 205 lb 12.8 oz (93.4 kg)  10/18/21 200 lb 2.8 oz (90.8 kg)  10/11/21 200 lb 3.2 oz (90.8 kg)  07/23/21 190 lb (86.2 kg)  07/16/21 194 lb 1.6 oz (88 kg)    BMI Readings from Last 5 Encounters:  11/12/21 27.91 kg/m  10/18/21 27.15 kg/m  10/11/21 27.15 kg/m  07/23/21 25.77 kg/m  07/16/21 26.32 kg/m     Physical Exam General: Sitting in chair, no distress Eyes: EOMI, no icterus Neck: Supple, no JVP Pulmonary: Clear, normal work of breathing Cardiovascular: Regular rate and rhythm, no murmur Abdomen: Nondistended, bowel sounds present MSK: Right arm in sling, cast on right arm,, no others evidence of synovitis or joint effusions Neuro: Normal gait, no weakness Psych: Normal mood, full affect  Assessment & Plan:   Hemoptysis: After coughing, in the setting of thrombocytopenia.  Right upper lobe infiltrate likely reflective of blood in the lung, possible concomitant pneumonia although he did not have really noticed any other symptoms on  random cough.  Hemoptysis improved with antibiotic therapy.  No further work-up given occurred in the setting of thrombocytopenia.  Right upper lobe infiltrate: Suspicious for pneumonia.  Cough has resolved.  Discussed repeating imaging in the next few weeks to ensure resolution.  This is recommended.  He declined this intervention today.   Return if symptoms worsen or fail to improve.   Lanier Clam, MD 11/13/2021

## 2021-12-16 ENCOUNTER — Other Ambulatory Visit (HOSPITAL_COMMUNITY): Payer: Self-pay | Admitting: Psychiatry

## 2021-12-16 DIAGNOSIS — R41840 Attention and concentration deficit: Secondary | ICD-10-CM

## 2021-12-20 ENCOUNTER — Encounter (HOSPITAL_BASED_OUTPATIENT_CLINIC_OR_DEPARTMENT_OTHER): Payer: Self-pay | Admitting: Cardiology

## 2021-12-20 DIAGNOSIS — Z87898 Personal history of other specified conditions: Secondary | ICD-10-CM | POA: Insufficient documentation

## 2021-12-20 DIAGNOSIS — Z8679 Personal history of other diseases of the circulatory system: Secondary | ICD-10-CM | POA: Insufficient documentation

## 2022-01-13 ENCOUNTER — Ambulatory Visit (HOSPITAL_BASED_OUTPATIENT_CLINIC_OR_DEPARTMENT_OTHER): Payer: Medicaid Other | Admitting: Cardiology

## 2022-01-15 ENCOUNTER — Telehealth (HOSPITAL_COMMUNITY): Payer: Medicaid Other | Admitting: Psychiatry

## 2022-02-05 ENCOUNTER — Ambulatory Visit (INDEPENDENT_AMBULATORY_CARE_PROVIDER_SITE_OTHER): Payer: Medicaid Other | Admitting: Cardiology

## 2022-02-05 ENCOUNTER — Encounter (HOSPITAL_BASED_OUTPATIENT_CLINIC_OR_DEPARTMENT_OTHER): Payer: Self-pay | Admitting: Cardiology

## 2022-02-05 VITALS — BP 130/90 | HR 72 | Ht 72.0 in | Wt 210.8 lb

## 2022-02-05 DIAGNOSIS — R002 Palpitations: Secondary | ICD-10-CM | POA: Diagnosis not present

## 2022-02-05 DIAGNOSIS — E119 Type 2 diabetes mellitus without complications: Secondary | ICD-10-CM | POA: Diagnosis not present

## 2022-02-05 DIAGNOSIS — I10 Essential (primary) hypertension: Secondary | ICD-10-CM | POA: Diagnosis not present

## 2022-02-05 DIAGNOSIS — Z87898 Personal history of other specified conditions: Secondary | ICD-10-CM

## 2022-02-05 DIAGNOSIS — R0789 Other chest pain: Secondary | ICD-10-CM

## 2022-02-05 DIAGNOSIS — B182 Chronic viral hepatitis C: Secondary | ICD-10-CM

## 2022-02-05 DIAGNOSIS — Z794 Long term (current) use of insulin: Secondary | ICD-10-CM

## 2022-02-05 NOTE — Progress Notes (Signed)
?Cardiology Office Note:   ? ?Date:  02/07/2022  ? ?ID:  Chase Reed, DOB 1966/07/23, MRN [id number] ? ?PCP:  Reyes Ivan, PA-C  ?Cardiologist:  Buford Dresser, MD ? ?Referring MD: Reyes Ivan,*  ? ?CC: follow-up ? ?History of Present Illness:   ? ?Chase Reed is a 56 y.o. male with a hx of type II diabetes, chronic hepatitis C on epclusa with cirrhosis,  who is seen for follow-up. I initially met him 07/01/2021 as a new consult at the request of Reyes Ivan,* for the evaluation and management of syncope. ? ? ?Today,  ?Overall, he says everything is going ok. He is 7 months since his last IV drug use, congratulated on this.  ? ?Lately he has had some dizzy spells, which have caused a few falls.. He says that if he stands up, he does not have balance, everything "goes gray on me" and he becomes lightheaded. A couple times a month, as he lays down he experiences the room shaking. He admits to having previous damage to his ear which could be a contributing factor to his symptoms.  ? ?He is experiencing some palpitations. The palpitations normally occur once a day for a few seconds. Sometimes they do not occur, and sometimes they occur multiple times in one day. The palpitations occur mostly when he is sitting and not doing anything. He will take a deep breath, and his episodes will resolve in a few seconds. ? ?Additionally, he complains of sharp and isolated chest pain. He is concerned that the occurrence of palpitations and chest pains are from having previous heart surgery.  ? ?Previously he had an isolated episode of hemoptysis and was admitted to the hospital for 4-5 days. He had hemorraghing and pneumonia in his lungs. This has resolved. ? ?He denies any shortness of breath, or peripheral edema. No headaches, orthopnea, or PND.  ? ?Past Medical History:  ?Diagnosis Date  ? Cirrhosis ([place])   ? Diabetes mellitus without complication ([place])   ? Hepatitis   ? ? ?Past  Surgical History:  ?Procedure Laterality Date  ? APPLICATION OF WOUND VAC N/A 12/24/2018  ? Procedure: APPLICATION OF WOUND VAC;  Surgeon: Gaye Pollack, MD;  Location: [facility];  Service: Thoracic;  Laterality: N/A;  ? APPLICATION OF WOUND VAC N/A 12/27/2018  ? Procedure: WOUND VAC CHANGE;  Surgeon: Gaye Pollack, MD;  Location: [facility];  Service: Thoracic;  Laterality: N/A;  ? APPLICATION OF WOUND VAC N/A 01/14/2019  ? Procedure: APPLICATION OF WOUND VAC;  Surgeon: Gaye Pollack, MD;  Location: [facility];  Service: Vascular;  Laterality: N/A;  ? I & D EXTREMITY N/A 01/14/2019  ? Procedure: DEBRIDEMENT OF STERNOCLAVICULAR JOINT;  Surgeon: Gaye Pollack, MD;  Location[facility];  Service: Vascular;  Laterality: N/A;  ? I & D EXTREMITY Right 10/03/2020  ? Procedure: Irrigation debridement of RIGHT forearm subfascial abscess measuring approximately 6 x 3 x 1.5 cm;  Surgeon: Verner Mould, MD;  Location: [facility];  Service: Orthopedics;  Laterality: Right;  ? LITHOTRIPSY    ? STERNAL WOUND DEBRIDEMENT N/A 12/24/2018  ? Procedure: I&D CHEST WALL ABSCESS;  Surgeon: Gaye Pollack, MD;  Location: [facility];  Service: Thoracic;  Laterality: N/A;  ? STERNAL WOUND DEBRIDEMENT N/A 12/27/2018  ? Procedure: DEBRIDEMENT OF CHEST WALL WOUND;  Surgeon: Gaye Pollack, MD;  Location: [facility] OR;  Service: Thoracic;  Laterality: N/A;  ? TEE WITHOUT CARDIOVERSION N/A 01/03/2019  ?  Procedure: TRANSESOPHAGEAL ECHOCARDIOGRAM (TEE);  Surgeon: Sanda Klein, MD;  Location: [facility];  Service: Cardiovascular;  Laterality: N/A;  ? ? ?Current Medications: ?Current Outpatient Medications on File Prior to Visit  ?Medication Sig  ? atomoxetine (STRATTERA) 80 MG capsule Take 1 capsule (80 mg total) by mouth daily.  ? DULoxetine (CYMBALTA) 60 MG capsule Take 1 capsule (60 mg total) by mouth daily.  ? gabapentin (NEURONTIN) 600 MG tablet Take 600 mg by mouth in the morning, at noon, in the evening, and at bedtime.   ? insulin aspart (NOVOLOG) 100 UNIT/ML  injection Inject 2-8 Units into the skin See admin instructions. Take 2-8 units as needed  ?Sliding scale.over 250--2 units ?over 300--4 units ?over 350--6 units ?over 400--8 units ?Up to 50 units daily: For diabetes management  ? insulin glargine (LANTUS) 100 UNIT/ML injection Inject 30 Units into the skin daily.   ? Iron, Ferrous Sulfate, 325 (65 Fe) MG TABS Take 325 mg by mouth daily.  ? JANUVIA 100 MG tablet Take 100 mg by mouth daily.  ? metFORMIN (GLUCOPHAGE) 500 MG tablet Take by mouth 2 (two) times daily with a meal.  ? ondansetron (ZOFRAN-ODT) 4 MG disintegrating tablet 4 mg every 8 (eight) hours as needed.  ? ondansetron (ZOFRAN-ODT) 8 MG disintegrating tablet dissolve 1 tablet(s) by mouth sublingually EVERY 8 hours as needed for nausea  ? oxyCODONE-acetaminophen (PERCOCET) 10-325 MG tablet SMARTSIG:1 Tablet(s) By Mouth Every 12 Hours  ? sertraline (ZOLOFT) 100 MG tablet Take 100 mg by mouth daily.  ? traZODone (DESYREL) 50 MG tablet Take 2 tablets (100 mg total) by mouth at bedtime as needed for sleep.  ? ?No current facility-administered medications on file prior to visit.  ?  ? ?Allergies:   Atorvastatin  ? ?Social History  ? ?Tobacco Use  ? Smoking status: Some Days  ?  Packs/day: 0.25  ?  Years: 30.00  ?  Pack years: 7.50  ?  Types: Cigarettes  ? Smokeless tobacco: Former  ?Vaping Use  ? Vaping Use: Never used  ?Substance Use Topics  ? Alcohol use: No  ? 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  Comment: Fentanyl and "Oxy" per patient   ? ? ?Family History: ?family history includes COPD in his father. ? ?ROS:   ?Please see the history of present illness. ?(+) Palpitations ?(+) Chest pain ?(+) Lightheadedness ?(+) Dizziness ?(+) Near-syncope ?All other systems are reviewed and negative.  ? ? ?EKGs/Labs/Other Studies Reviewed:   ? ?The following studies were reviewed today: ? ?CT Angio 10/18/2021: ?FINDINGS: ?Cardiovascular: No filling defects within the pulmonary arteries to ?suggest acute pulmonary  embolism. Coronary artery calcification and ?aortic atherosclerotic calcification. ?  ?Mediastinum/Nodes: No mediastinal adenopathy. No axillary or ?supraclavicular adenopathy. Medial to the sternal wound with ?deformity of the manubrium. ?  ?Lungs/Pleura: Segmental airspace disease in the anterior aspect of ?the RIGHT upper lobe with mild nodularity (image 58/7). No pleural ?fluid. No pneumothorax ?  ?Upper Abdomen: 6 Limited view of the liver, kidneys, pancreas are ?unremarkable. Normal adrenal glands. Nodular contour of the liver. ?Spleen is enlarged. Gallstones noted. ?  ?Musculoskeletal: No aggressive osseous lesion. Remote sternal ?fracture with interval healing ?  ?Review of the MIP images confirms the above findings. ?  ?IMPRESSION: ?1. RIGHT upper lobe nodular airspace disease is most suggestive of ?pulmonary infection. Pulmonary hemorrhage could have a similar ?appearance. Combination of infection with hemorrhage could also be ?considered in patient presenting with hemoptysis. ?2. No acute pulmonary  embolism. ?3. No mediastinal lymphadenopathy. ?4. No pleural fluid. ?5. Remote healed sternal wound. ? ?TEE 01/03/2019 ?1. The left ventricle has normal systolic function, with an ejection  ?fraction of 60-65%. The cavity size was normal.  ? 2. The right ventricle has normal systolc function. The cavity was  ?normal. There is no increase in right ventricular wall thickness.  ? 3. The mitral valve is normal in structure.  ? 4. The tricuspid valve was normal in structure.  ? 5. The aortic valve is normal in structure.  ? 6. The pulmonic valve was normal in structure.  ? 7. The aortic root, ascending aorta, aortic arch and descending aorta are  ?normal in size and structure.  ? 8. There is evidence of plaque in the descending aorta.  ? 9. No echo evidence of endocarditis.  ? ?Echo 12/24/2018 ?1. The left ventricle has hyperdynamic systolic function, with an  ?ejection fraction of >65%. The cavity size was normal.  Left ventricular  ?diastolic parameters were normal.  ? 2. The right ventricle has normal systolic function. The cavity was  ?normal. There is no increase in right ventricular wall thickness. Right  ?ventricular sy

## 2022-02-05 NOTE — Patient Instructions (Signed)
Medication Instructions:  ?Your Physician recommend you continue on your current medication as directed.   ? ?*If you need a refill on your cardiac medications before your next appointment, please call your pharmacy* ? ? ?Lab Work: ?None ordered today ? ? ?Testing/Procedures: ?None ordered today ? ?Please call our office if symptoms worsen and or you decide to wear a monitor.   ? ? ?Follow-Up: ?At [facility], you and your health needs are our priority.  As part of our continuing mission to provide you with exceptional heart care, we have created designated Provider Care Teams.  These Care Teams include your primary Cardiologist (physician) and Advanced Practice Providers (APPs -  Physician Assistants and Nurse Practitioners) who all work together to provide you with the care you need, when you need it. ? ?We recommend signing up for the patient portal called "MyChart".  Sign up information is provided on this After Visit Summary.  MyChart is used to connect with patients for Virtual Visits (Telemedicine).  Patients are able to view lab/test results, encounter notes, upcoming appointments, etc.  Non-urgent messages can be sent to your provider as well.   ?To learn more about what you can do with MyChart, go to [email or web address].   ? ?Your next appointment:   ?6 month(s) ? ?The format for your next appointment:   ?In Person ? ?Provider:   ?Jodelle Red, MD{ ? ? ?

## 2022-02-07 ENCOUNTER — Encounter (HOSPITAL_BASED_OUTPATIENT_CLINIC_OR_DEPARTMENT_OTHER): Payer: Self-pay | Admitting: Cardiology

## 2022-02-13 ENCOUNTER — Telehealth: Payer: Self-pay

## 2022-02-13 NOTE — Telephone Encounter (Signed)
-  Nurse attempted to contact pt to see who we fax the cardiac medical source statement form to. ?-Left message to call back. ?

## 2022-02-28 ENCOUNTER — Telehealth (HOSPITAL_COMMUNITY): Payer: Self-pay | Admitting: Psychiatry

## 2022-02-28 NOTE — Telephone Encounter (Signed)
Patient presented Friday afternoon requesting to have Mental Health Medical Source Statement completed by Dr. Doyne Keel. Patient was informed that she is on maternity leave and this could possibly be completed by covering provider or Wellsite geologist. Patient was last seen in December 2022, missed follow-up appointment in March 2023 due to not updating new phone number. Phone number updated and appointment rescheduled to Mar 18, 2022.  ? ?Writer informed she will call to follow-up with patient on Monday regarding if ppw can be completed or not.  ?

## 2022-03-04 ENCOUNTER — Other Ambulatory Visit (HOSPITAL_COMMUNITY): Payer: Self-pay | Admitting: Psychiatry

## 2022-03-04 DIAGNOSIS — F333 Major depressive disorder, recurrent, severe with psychotic symptoms: Secondary | ICD-10-CM

## 2022-03-18 ENCOUNTER — Telehealth (HOSPITAL_COMMUNITY): Payer: Medicaid Other | Admitting: Psychiatry

## 2022-03-28 ENCOUNTER — Other Ambulatory Visit (HOSPITAL_COMMUNITY): Payer: Self-pay | Admitting: Psychiatry

## 2022-03-28 DIAGNOSIS — F333 Major depressive disorder, recurrent, severe with psychotic symptoms: Secondary | ICD-10-CM

## 2022-03-29 IMAGING — CT CT ANGIO CHEST
2 of 7 series · 17 of 46 positions shown · IV contrast (omnipaque)
Comparison: None.

CLINICAL DATA: High concern for pulmonary embolism. Hemoptysis and
shortness of breath.

EXAM:
CT ANGIOGRAPHY CHEST WITH CONTRAST
TECHNIQUE: Multidetector CT imaging of the chest was performed using the
standard protocol during bolus administration of intravenous
contrast. Multiplanar CT image reconstructions and MIPs were
obtained to evaluate the vascular anatomy.
CONTRAST:  72mL OMNIPAQUE IOHEXOL 350 MG/ML SOLN

[Series 6: pe axial thins · axial · 0.85mm/px · z∈[-174,+91]mm · 14 of 307 slices shown]
[im 21/307  lung]
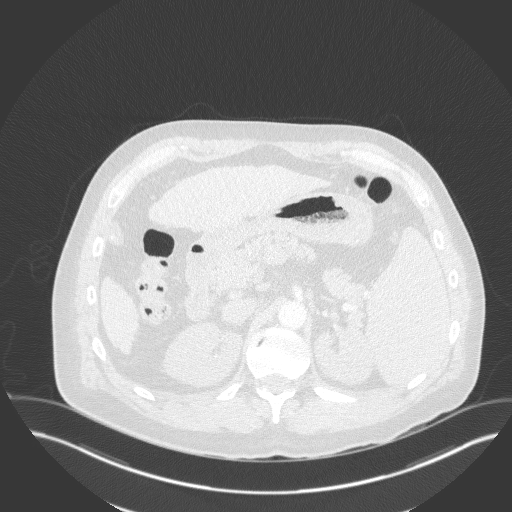
[im 41/307  soft-tissue]
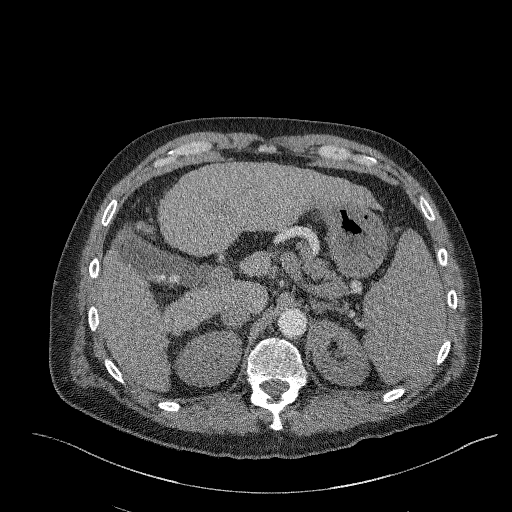
[im 62/307  lung]
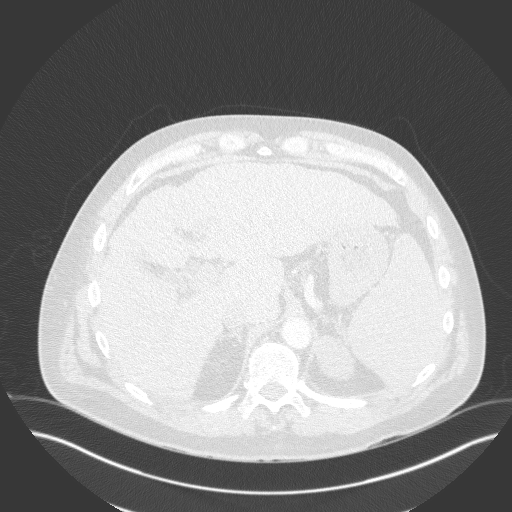
[im 82/307  soft-tissue]
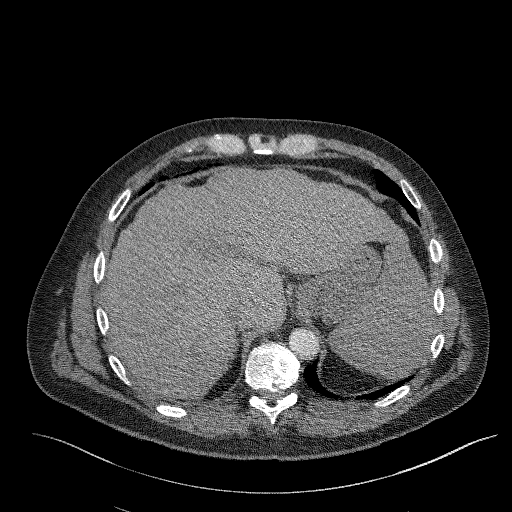
[im 103/307  lung]
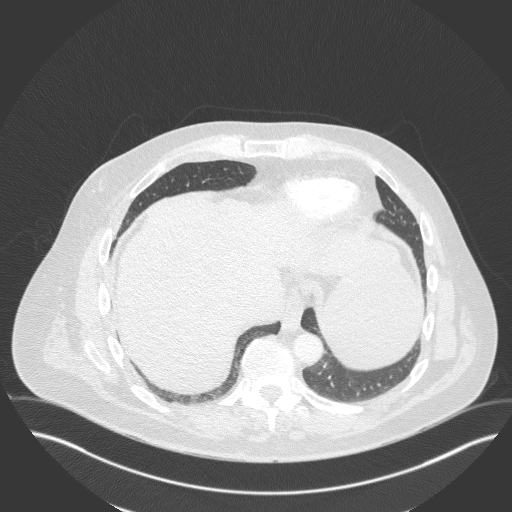
[im 123/307  soft-tissue]
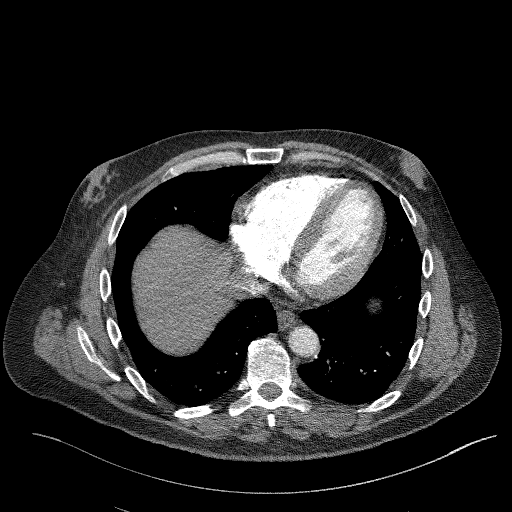
[im 143/307  lung]
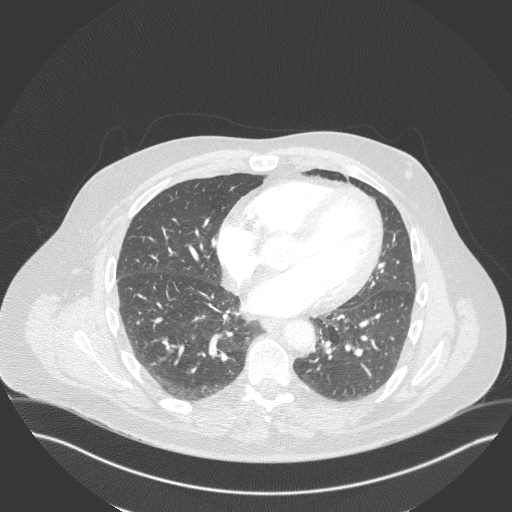
[im 164/307  soft-tissue]
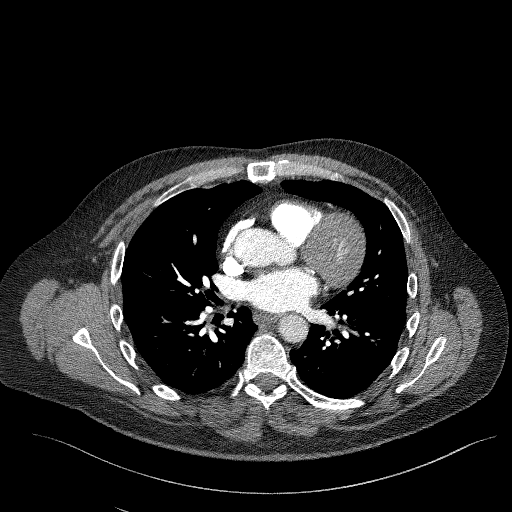
[im 184/307  lung]
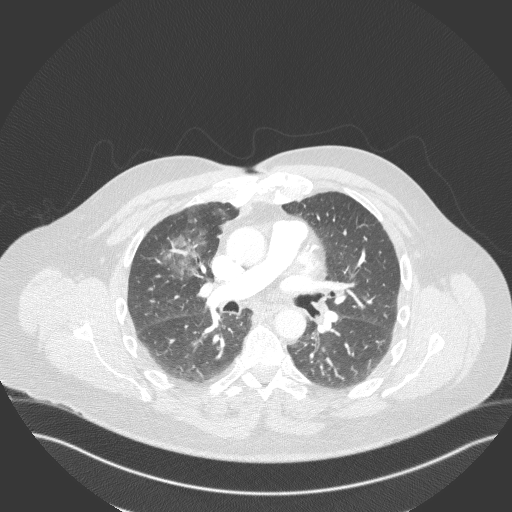
[im 205/307  soft-tissue]
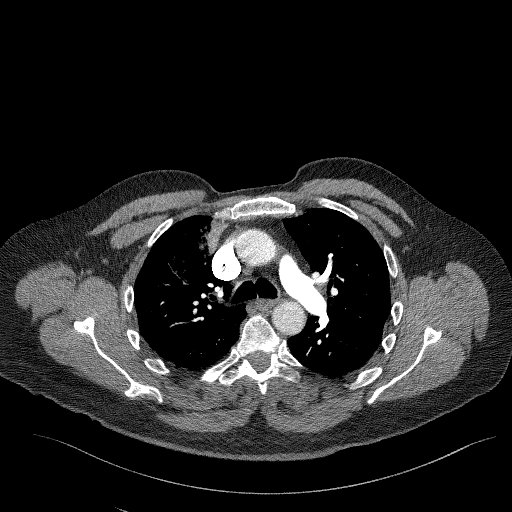
[im 225/307  lung]
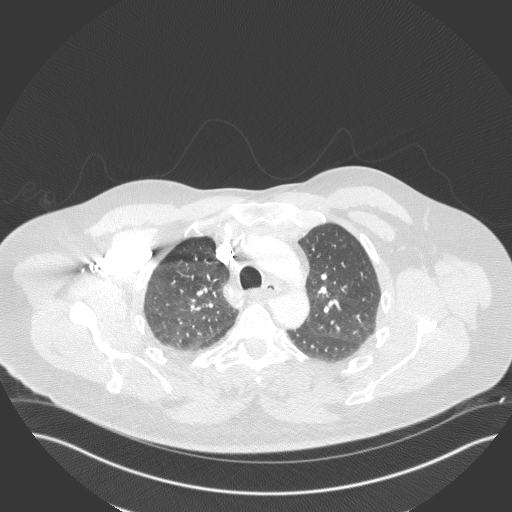
[im 245/307  soft-tissue]
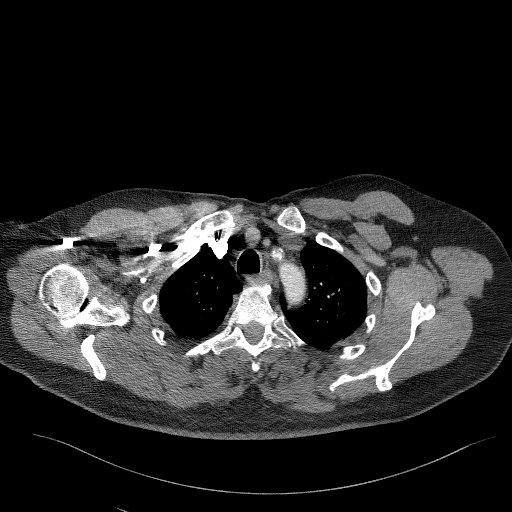
[im 266/307  lung]
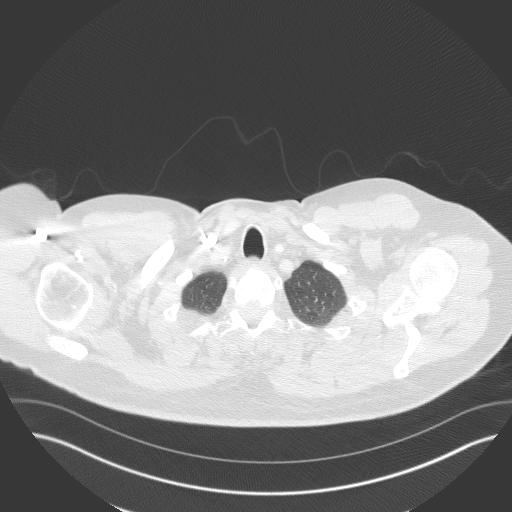
[im 286/307  soft-tissue]
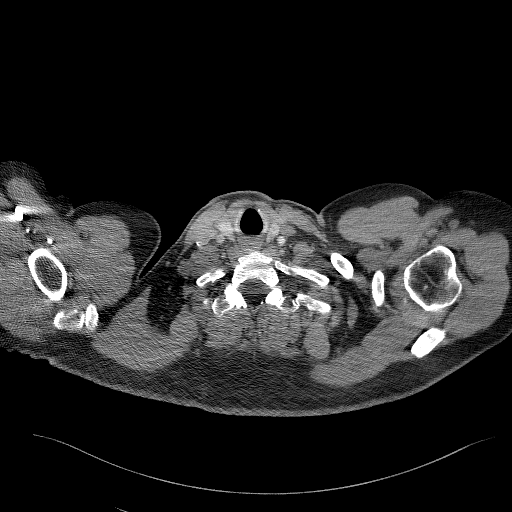

[Series 8: cor soft · coronal · 0.69mm/px · 3 of 142 slices shown]
[im 36/142  soft-tissue]
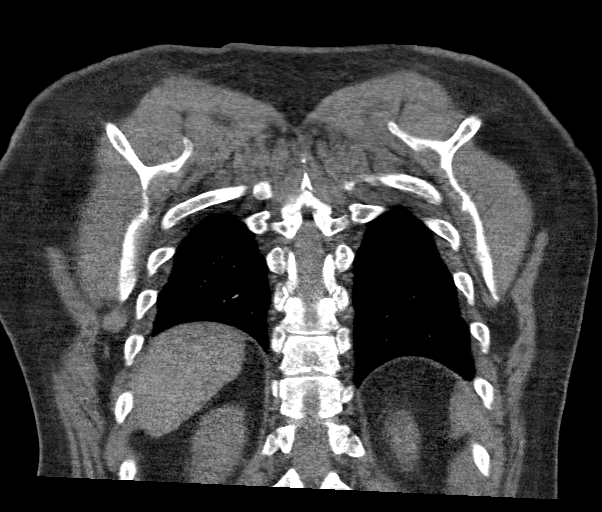
[im 71/142  soft-tissue]
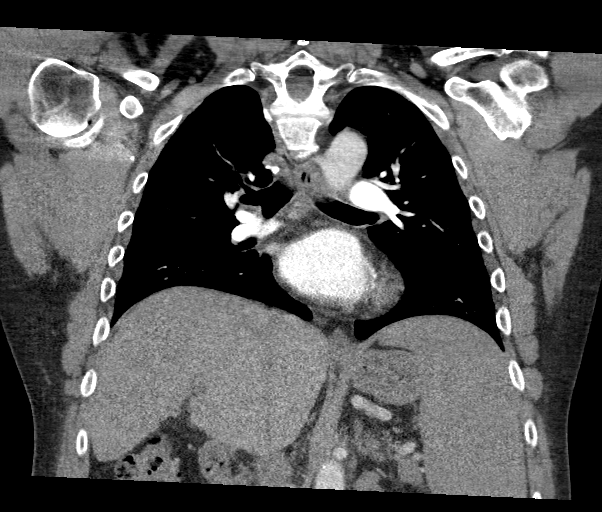
[im 106/142  soft-tissue]
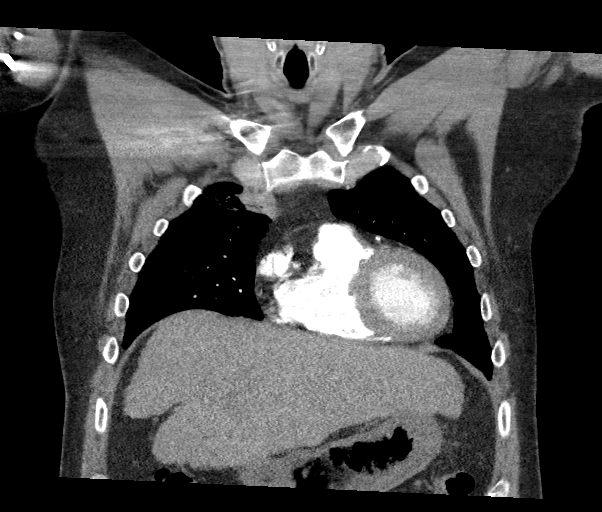

[17 of 46 positions shown; findings below may reference images not displayed]

FINDINGS: Cardiovascular: No filling defects within the pulmonary arteries to
suggest acute pulmonary embolism. Coronary artery calcification and
aortic atherosclerotic calcification.

Mediastinum/Nodes: No mediastinal adenopathy. No axillary or
supraclavicular adenopathy. Medial to the sternal wound with
deformity of the manubrium.

Lungs/Pleura: Segmental airspace disease in the anterior aspect of
the RIGHT upper lobe with mild nodularity (image 58/7). No pleural
fluid. No pneumothorax

Upper Abdomen: 6 Limited view of the liver, kidneys, pancreas are
unremarkable. Normal adrenal glands. Nodular contour of the liver.
Spleen is enlarged. Gallstones noted.

Musculoskeletal: No aggressive osseous lesion. Remote sternal
fracture with interval healing

Review of the MIP images confirms the above findings.
IMPRESSION: 1. RIGHT upper lobe nodular airspace disease is most suggestive of
pulmonary infection. Pulmonary hemorrhage could have a similar
appearance. Combination of infection with hemorrhage could also be
considered in patient presenting with hemoptysis.
2. No acute pulmonary embolism.
3. No mediastinal lymphadenopathy.
4. No pleural fluid.
5. Remote healed sternal wound.

## 2022-05-02 ENCOUNTER — Other Ambulatory Visit (HOSPITAL_COMMUNITY): Payer: Self-pay | Admitting: Psychiatry

## 2022-05-02 DIAGNOSIS — R41840 Attention and concentration deficit: Secondary | ICD-10-CM

## 2022-06-02 ENCOUNTER — Other Ambulatory Visit (HOSPITAL_COMMUNITY): Payer: Self-pay | Admitting: Psychiatry

## 2022-06-02 DIAGNOSIS — F333 Major depressive disorder, recurrent, severe with psychotic symptoms: Secondary | ICD-10-CM

## 2022-06-05 NOTE — Telephone Encounter (Signed)
Received request for refill of patient's Risperdal from the pharmacy.  This was not done as patient was no longer on this medicine as of his last appointment 12/28.    Arna Snipe MD Resident

## 2022-06-16 ENCOUNTER — Other Ambulatory Visit: Payer: Self-pay

## 2022-06-16 ENCOUNTER — Emergency Department (HOSPITAL_BASED_OUTPATIENT_CLINIC_OR_DEPARTMENT_OTHER)
Admission: EM | Admit: 2022-06-16 | Discharge: 2022-06-16 | Disposition: A | Discharge status: Home / Self Care | Payer: Medicaid Other | Attending: Emergency Medicine | Admitting: Emergency Medicine

## 2022-06-16 ENCOUNTER — Encounter (HOSPITAL_BASED_OUTPATIENT_CLINIC_OR_DEPARTMENT_OTHER): Payer: Self-pay | Admitting: Emergency Medicine

## 2022-06-16 DIAGNOSIS — L0291 Cutaneous abscess, unspecified: Secondary | ICD-10-CM

## 2022-06-16 DIAGNOSIS — L0231 Cutaneous abscess of buttock: Secondary | ICD-10-CM | POA: Diagnosis not present

## 2022-06-16 DIAGNOSIS — B356 Tinea cruris: Secondary | ICD-10-CM | POA: Diagnosis not present

## 2022-06-16 DIAGNOSIS — R21 Rash and other nonspecific skin eruption: Secondary | ICD-10-CM | POA: Diagnosis present

## 2022-06-16 MED ORDER — FLUCONAZOLE 200 MG PO TABS: 200.0000 mg | ORAL_TABLET | Freq: Once | ORAL | 0 refills | Status: AC

## 2022-06-16 MED ORDER — LIDOCAINE-EPINEPHRINE (PF) 2 %-1:200000 IJ SOLN
20.0000 mL | Freq: Once | INTRAMUSCULAR | Status: AC
  Administered 2022-06-16: 20 mL via INTRADERMAL
  Filled 2022-06-16: qty 20

## 2022-06-16 MED ORDER — DOXYCYCLINE HYCLATE 100 MG PO TABS
100.0000 mg | ORAL_TABLET | Freq: Once | ORAL | Status: AC
Start: 2022-06-16 — End: 2022-06-16
  Administered 2022-06-16: 100 mg via ORAL
  Filled 2022-06-16: qty 1

## 2022-06-16 MED ORDER — FLUCONAZOLE 150 MG PO TABS
150.0000 mg | ORAL_TABLET | Freq: Once | ORAL | Status: AC
  Administered 2022-06-16: 150 mg via ORAL
  Filled 2022-06-16: qty 1

## 2022-06-16 MED ORDER — DOXYCYCLINE HYCLATE 100 MG PO CAPS: 100.0000 mg | ORAL_CAPSULE | Freq: Two times a day (BID) | ORAL | 0 refills | Status: DC

## 2022-06-16 NOTE — ED Notes (Signed)
EDP at bedside  

## 2022-06-16 NOTE — Discharge Instructions (Signed)
Warm compresses at least 4 times a day.  Please return for rapid spreading redness if you develop a fever.  I am treating you with any oral antifungal medicine.  You were given a dose today and should take another dose in a week.  If your symptoms persist then your doctor may choose to treat you for 2 more weeks if needed.  Please take the antibiotics as prescribed.

## 2022-06-16 NOTE — ED Triage Notes (Signed)
Pt arrives to ED with c/o rash to scrotum and inner thighs as well as multiple cyst/abscesses to inner legs and buttocks that started over the past week.

## 2022-06-16 NOTE — ED Provider Notes (Signed)
[facility] EMERGENCY DEPT Provider Note   CSN: [id number] Arrival date & time: 06/16/22  1856     History  Chief Complaint  Patient presents with   Abscess   Rash    Chase Reed is a 56 y.o. male.  56 yo M with a chief complaints of jock itch and multiple abscesses.  Has been squeezing these at home with some improvement.  Feels like the one on his right buttock has gotten rapidly worse in 24 hours and feels like it needs to be lanced.  He has tried topical treatments for his jock itch but without improvement.   Abscess Rash      Home Medications Prior to Admission medications   Medication Sig Start Date End Date Taking? Authorizing Provider  doxycycline (VIBRAMYCIN) 100 MG capsule Take 1 capsule (100 mg total) by mouth 2 (two) times daily. One po bid x 7 days 06/16/22  Yes Melene Plan, DO  fluconazole (DIFLUCAN) 200 MG tablet Take 1 tablet (200 mg total) by mouth once for 1 dose. 06/23/22 06/23/22 Yes Melene Plan, DO  atomoxetine (STRATTERA) 80 MG capsule Take 1 capsule (80 mg total) by mouth daily. 12/16/21   Shanna Cisco, NP  DULoxetine (CYMBALTA) 60 MG capsule Take 1 capsule (60 mg total) by mouth daily. 10/22/21   Shanna Cisco, NP  gabapentin (NEURONTIN) 600 MG tablet Take 600 mg by mouth in the morning, at noon, in the evening, and at bedtime.  01/20/19   [provider]  insulin aspart (NOVOLOG) 100 UNIT/ML injection Inject 2-8 Units into the skin See admin instructions. Take 2-8 units as needed  Sliding scale.over 250--2 units over 300--4 units over 350--6 units over 400--8 units Up to 50 units daily: For diabetes management 10/20/21   Glade Lloyd, MD  insulin glargine (LANTUS) 100 UNIT/ML injection Inject 30 Units into the skin daily.     [provider]  Iron, Ferrous Sulfate, 325 (65 Fe) MG TABS Take 325 mg by mouth daily. 09/23/19   Jaci Standard, MD  JANUVIA 100 MG tablet Take 100 mg by mouth daily. 05/07/21    [provider]  metFORMIN (GLUCOPHAGE) 500 MG tablet Take by mouth 2 (two) times daily with a meal.    [provider]  ondansetron (ZOFRAN-ODT) 4 MG disintegrating tablet 4 mg every 8 (eight) hours as needed. 11/25/21   [provider]  ondansetron (ZOFRAN-ODT) 8 MG disintegrating tablet dissolve 1 tablet(s) by mouth sublingually EVERY 8 hours as needed for nausea 01/13/22   [provider]  oxyCODONE-acetaminophen (PERCOCET) 10-325 MG tablet SMARTSIG:1 Tablet(s) By Mouth Every 12 Hours 11/26/21   [provider]  sertraline (ZOLOFT) 100 MG tablet Take 100 mg by mouth daily. 03/11/21   [provider]  traZODone (DESYREL) 50 MG tablet Take 2 tablets (100 mg total) by mouth at bedtime as needed for sleep. 10/30/21   Shanna Cisco, NP      Allergies    Atorvastatin    Review of Systems   Review of Systems  Skin:  Positive for rash.    Physical Exam Updated Vital Signs BP 124/82 (BP Location: Right Arm)   Pulse 68   Temp 97.6 F (36.4 C)   Resp 16   Ht 6' (1.829 m)   Wt 90.7 kg   SpO2 98%   BMI 27.12 kg/m  Physical Exam Vitals and nursing note reviewed.  Constitutional:      Appearance: He is well-developed.  HENT:     Head: Normocephalic and atraumatic.  Eyes:     Pupils: Pupils are equal, round, and reactive to light.  Neck:     Vascular: No JVD.  Cardiovascular:     Rate and Rhythm: Normal rate and regular rhythm.     Heart sounds: No murmur heard.    No friction rub. No gallop.  Pulmonary:     Effort: No respiratory distress.     Breath sounds: No wheezing.  Abdominal:     General: There is no distension.     Tenderness: There is no abdominal tenderness. There is no guarding or rebound.  Genitourinary:    Comments: Erythema with satellite lesions along the groin.  There are multiple ulcerations from what appear to be prior excised abscesses. Musculoskeletal:        General: Normal range of motion.      Cervical back: Normal range of motion and neck supple.  Skin:    Coloration: Skin is not pale.     Findings: No rash.  Neurological:     Mental Status: He is alert and oriented to person, place, and time.  Psychiatric:        Behavior: Behavior normal.     ED Results / Procedures / Treatments   Labs (all labs ordered are listed, but only abnormal results are displayed) Labs Reviewed - No data to display  EKG None  Radiology No results found.  Procedures Procedures    Medications Ordered in ED Medications  fluconazole (DIFLUCAN) tablet 150 mg (has no administration in time range)  doxycycline (VIBRA-TABS) tablet 100 mg (has no administration in time range)  lidocaine-EPINEPHrine (XYLOCAINE W/EPI) 2 %-1:200000 (PF) injection 20 mL (20 mLs Intradermal Given by Other 06/16/22 2159)    ED Course/ Medical Decision Making/ A&P                           Medical Decision Making Risk Prescription drug management.   56 yo M with a chief complaints of jock itch and multiple abscesses.  He is complaining mostly of the abscess on his right buttock.  I&D performed at bedside.  As he is reported failure of topical therapy for his tinea cruris, will start on oral therapy.  PCP follow-up.  10:37 PM:  I have discussed the diagnosis/risks/treatment options with the patient.  Evaluation and diagnostic testing in the emergency department does not suggest an emergent condition requiring admission or immediate intervention beyond what has been performed at this time.  They will follow up with  PCP. We also discussed returning to the ED immediately if new or worsening sx occur. We discussed the sx which are most concerning (e.g., sudden worsening pain, fever, inability to tolerate by mouth) that necessitate immediate return. Medications administered to the patient during their visit and any new prescriptions provided to the patient are listed below.  Medications given during this  visit Medications  fluconazole (DIFLUCAN) tablet 150 mg (has no administration in time range)  doxycycline (VIBRA-TABS) tablet 100 mg (has no administration in time range)  lidocaine-EPINEPHrine (XYLOCAINE W/EPI) 2 %-1:200000 (PF) injection 20 mL (20 mLs Intradermal Given by Other 06/16/22 2159)     The patient appears reasonably screen and/or stabilized for discharge and I doubt any other medical condition or other [facility] requiring further screening, evaluation, or treatment in the ED at this time prior to discharge.          Final Clinical Impression(s) /  ED Diagnoses Final diagnoses:  Tinea cruris  Abscess    Rx / DC Orders ED Discharge Orders          Ordered    fluconazole (DIFLUCAN) 200 MG tablet   Once        06/16/22 2231    doxycycline (VIBRAMYCIN) 100 MG capsule  2 times daily        06/16/22 2231              Melene Plan, DO 06/16/22 2237

## 2022-08-23 ENCOUNTER — Encounter (HOSPITAL_BASED_OUTPATIENT_CLINIC_OR_DEPARTMENT_OTHER): Payer: Self-pay | Admitting: Emergency Medicine

## 2022-08-23 ENCOUNTER — Other Ambulatory Visit: Payer: Self-pay

## 2022-08-23 DIAGNOSIS — F17210 Nicotine dependence, cigarettes, uncomplicated: Secondary | ICD-10-CM | POA: Diagnosis present

## 2022-08-23 DIAGNOSIS — L03113 Cellulitis of right upper limb: Principal | ICD-10-CM | POA: Diagnosis present

## 2022-08-23 DIAGNOSIS — F1914 Other psychoactive substance abuse with psychoactive substance-induced mood disorder: Secondary | ICD-10-CM | POA: Diagnosis present

## 2022-08-23 DIAGNOSIS — M1810 Unilateral primary osteoarthritis of first carpometacarpal joint, unspecified hand: Secondary | ICD-10-CM | POA: Diagnosis present

## 2022-08-23 DIAGNOSIS — I10 Essential (primary) hypertension: Secondary | ICD-10-CM | POA: Diagnosis present

## 2022-08-23 DIAGNOSIS — L02511 Cutaneous abscess of right hand: Secondary | ICD-10-CM | POA: Diagnosis present

## 2022-08-23 DIAGNOSIS — B182 Chronic viral hepatitis C: Secondary | ICD-10-CM | POA: Diagnosis present

## 2022-08-23 DIAGNOSIS — Z79899 Other long term (current) drug therapy: Secondary | ICD-10-CM

## 2022-08-23 DIAGNOSIS — Z888 Allergy status to other drugs, medicaments and biological substances status: Secondary | ICD-10-CM

## 2022-08-23 DIAGNOSIS — K7460 Unspecified cirrhosis of liver: Secondary | ICD-10-CM | POA: Diagnosis present

## 2022-08-23 DIAGNOSIS — Z794 Long term (current) use of insulin: Secondary | ICD-10-CM

## 2022-08-23 DIAGNOSIS — Z8701 Personal history of pneumonia (recurrent): Secondary | ICD-10-CM

## 2022-08-23 DIAGNOSIS — F329 Major depressive disorder, single episode, unspecified: Secondary | ICD-10-CM | POA: Diagnosis present

## 2022-08-23 DIAGNOSIS — Z881 Allergy status to other antibiotic agents status: Secondary | ICD-10-CM

## 2022-08-23 DIAGNOSIS — Z7984 Long term (current) use of oral hypoglycemic drugs: Secondary | ICD-10-CM

## 2022-08-23 DIAGNOSIS — Z79891 Long term (current) use of opiate analgesic: Secondary | ICD-10-CM

## 2022-08-23 DIAGNOSIS — M65841 Other synovitis and tenosynovitis, right hand: Secondary | ICD-10-CM | POA: Diagnosis present

## 2022-08-23 DIAGNOSIS — R161 Splenomegaly, not elsewhere classified: Secondary | ICD-10-CM | POA: Diagnosis present

## 2022-08-23 DIAGNOSIS — D61818 Other pancytopenia: Secondary | ICD-10-CM | POA: Diagnosis present

## 2022-08-23 DIAGNOSIS — E1142 Type 2 diabetes mellitus with diabetic polyneuropathy: Secondary | ICD-10-CM | POA: Diagnosis present

## 2022-08-23 NOTE — ED Triage Notes (Signed)
Swelling of right hand. Redness. "I have an infection in my hand" Reports pain, too painful to move Denies injury Noted several open wounds on hand

## 2022-08-24 ENCOUNTER — Encounter (HOSPITAL_COMMUNITY): Payer: Self-pay | Admitting: Internal Medicine

## 2022-08-24 ENCOUNTER — Encounter (HOSPITAL_COMMUNITY): Payer: Self-pay

## 2022-08-24 ENCOUNTER — Inpatient Hospital Stay (HOSPITAL_BASED_OUTPATIENT_CLINIC_OR_DEPARTMENT_OTHER)
Admission: EM | Admit: 2022-08-24 | Discharge: 2022-08-27 | DRG: 603 | Disposition: A | Discharge status: Home / Self Care | Payer: Medicaid Other | Attending: Internal Medicine | Admitting: Internal Medicine

## 2022-08-24 ENCOUNTER — Inpatient Hospital Stay (HOSPITAL_COMMUNITY): Payer: Medicaid Other

## 2022-08-24 ENCOUNTER — Emergency Department (HOSPITAL_BASED_OUTPATIENT_CLINIC_OR_DEPARTMENT_OTHER): Payer: Medicaid Other

## 2022-08-24 DIAGNOSIS — M1810 Unilateral primary osteoarthritis of first carpometacarpal joint, unspecified hand: Secondary | ICD-10-CM | POA: Diagnosis present

## 2022-08-24 DIAGNOSIS — I10 Essential (primary) hypertension: Secondary | ICD-10-CM | POA: Diagnosis present

## 2022-08-24 DIAGNOSIS — K7460 Unspecified cirrhosis of liver: Secondary | ICD-10-CM | POA: Diagnosis present

## 2022-08-24 DIAGNOSIS — E1142 Type 2 diabetes mellitus with diabetic polyneuropathy: Secondary | ICD-10-CM

## 2022-08-24 DIAGNOSIS — L03113 Cellulitis of right upper limb: Principal | ICD-10-CM

## 2022-08-24 DIAGNOSIS — Z881 Allergy status to other antibiotic agents status: Secondary | ICD-10-CM | POA: Diagnosis not present

## 2022-08-24 DIAGNOSIS — F1910 Other psychoactive substance abuse, uncomplicated: Secondary | ICD-10-CM | POA: Diagnosis not present

## 2022-08-24 DIAGNOSIS — Z8701 Personal history of pneumonia (recurrent): Secondary | ICD-10-CM | POA: Diagnosis not present

## 2022-08-24 DIAGNOSIS — M65941 Unspecified synovitis and tenosynovitis, right hand: Secondary | ICD-10-CM | POA: Diagnosis present

## 2022-08-24 DIAGNOSIS — Z794 Long term (current) use of insulin: Secondary | ICD-10-CM

## 2022-08-24 DIAGNOSIS — B182 Chronic viral hepatitis C: Secondary | ICD-10-CM | POA: Diagnosis present

## 2022-08-24 DIAGNOSIS — M659 Synovitis and tenosynovitis, unspecified: Secondary | ICD-10-CM | POA: Diagnosis not present

## 2022-08-24 DIAGNOSIS — L02511 Cutaneous abscess of right hand: Secondary | ICD-10-CM | POA: Diagnosis present

## 2022-08-24 DIAGNOSIS — F17210 Nicotine dependence, cigarettes, uncomplicated: Secondary | ICD-10-CM | POA: Diagnosis present

## 2022-08-24 DIAGNOSIS — Z79891 Long term (current) use of opiate analgesic: Secondary | ICD-10-CM | POA: Diagnosis not present

## 2022-08-24 DIAGNOSIS — R161 Splenomegaly, not elsewhere classified: Secondary | ICD-10-CM | POA: Diagnosis present

## 2022-08-24 DIAGNOSIS — D61818 Other pancytopenia: Secondary | ICD-10-CM | POA: Diagnosis present

## 2022-08-24 DIAGNOSIS — M65841 Other synovitis and tenosynovitis, right hand: Secondary | ICD-10-CM | POA: Diagnosis present

## 2022-08-24 DIAGNOSIS — F329 Major depressive disorder, single episode, unspecified: Secondary | ICD-10-CM | POA: Diagnosis present

## 2022-08-24 DIAGNOSIS — F1914 Other psychoactive substance abuse with psychoactive substance-induced mood disorder: Secondary | ICD-10-CM | POA: Diagnosis present

## 2022-08-24 DIAGNOSIS — Z888 Allergy status to other drugs, medicaments and biological substances status: Secondary | ICD-10-CM | POA: Diagnosis not present

## 2022-08-24 DIAGNOSIS — E119 Type 2 diabetes mellitus without complications: Secondary | ICD-10-CM

## 2022-08-24 DIAGNOSIS — Z7984 Long term (current) use of oral hypoglycemic drugs: Secondary | ICD-10-CM | POA: Diagnosis not present

## 2022-08-24 DIAGNOSIS — Z79899 Other long term (current) drug therapy: Secondary | ICD-10-CM | POA: Diagnosis not present

## 2022-08-24 DIAGNOSIS — M009 Pyogenic arthritis, unspecified: Secondary | ICD-10-CM | POA: Insufficient documentation

## 2022-08-24 DIAGNOSIS — F1990 Other psychoactive substance use, unspecified, uncomplicated: Secondary | ICD-10-CM | POA: Diagnosis not present

## 2022-08-24 LAB — BASIC METABOLIC PANEL
Anion gap: 7 (ref 5–15)
BUN: 18 mg/dL (ref 6–20)
CO2: 30 mmol/L (ref 22–32)
Calcium: 9.6 mg/dL (ref 8.9–10.3)
Chloride: 99 mmol/L (ref 98–111)
Creatinine, Ser: 0.99 mg/dL (ref 0.61–1.24)
GFR, Estimated: >60 mL/min (ref >60)
Glucose, Bld: 132 mg/dL — ABNORMAL HIGH (ref 70–99)
Potassium: 4.4 mmol/L (ref 3.5–5.1)
Sodium: 136 mmol/L (ref 135–145)

## 2022-08-24 LAB — HEPATIC FUNCTION PANEL
ALT: 27 U/L (ref 0–44)
AST: 29 U/L (ref 15–41)
Albumin: 3.5 g/dL (ref 3.5–5.0)
Alkaline Phosphatase: 84 U/L (ref 38–126)
Bilirubin, Direct: 0.1 mg/dL (ref 0.0–0.2)
Indirect Bilirubin: 0.4 mg/dL (ref 0.3–0.9)
Total Bilirubin: 0.5 mg/dL (ref 0.3–1.2)
Total Protein: 6.8 g/dL (ref 6.5–8.1)

## 2022-08-24 LAB — GLUCOSE, CAPILLARY
Glucose-Capillary: 170 mg/dL — ABNORMAL HIGH (ref 70–99)
Glucose-Capillary: 181 mg/dL — ABNORMAL HIGH (ref 70–99)

## 2022-08-24 LAB — CBC WITH DIFFERENTIAL/PLATELET
Abs Immature Granulocytes: 0.01 10*3/uL (ref 0.00–0.07)
Basophils Absolute: 0 10*3/uL (ref 0.0–0.1)
Basophils Relative: 1 %
Eosinophils Absolute: 0 10*3/uL (ref 0.0–0.5)
Eosinophils Relative: 1 %
HCT: 30.9 % — ABNORMAL LOW (ref 39.0–52.0)
Hemoglobin: 10.1 g/dL — ABNORMAL LOW (ref 13.0–17.0)
Immature Granulocytes: 1 %
Lymphocytes Relative: 20 %
Lymphs Abs: 0.4 10*3/uL — ABNORMAL LOW (ref 0.7–4.0)
MCH: 29.7 pg (ref 26.0–34.0)
MCHC: 32.7 g/dL (ref 30.0–36.0)
MCV: 90.9 fL (ref 80.0–100.0)
Monocytes Absolute: 0.2 10*3/uL (ref 0.1–1.0)
Monocytes Relative: 13 %
Neutro Abs: 1.2 10*3/uL — ABNORMAL LOW (ref 1.7–7.7)
Neutrophils Relative %: 64 %
Platelets: 34 10*3/uL — ABNORMAL LOW (ref 150–400)
RBC: 3.4 MIL/uL — ABNORMAL LOW (ref 4.22–5.81)
RDW: 13.9 % (ref 11.5–15.5)
WBC: 1.9 10*3/uL — ABNORMAL LOW (ref 4.0–10.5)
nRBC: 0 % (ref 0.0–0.2)

## 2022-08-24 LAB — RAPID URINE DRUG SCREEN, HOSP PERFORMED
Amphetamines: NOT DETECTED
Barbiturates: NOT DETECTED
Benzodiazepines: NOT DETECTED
Cocaine: POSITIVE — AB
Opiates: POSITIVE — AB
Tetrahydrocannabinol: NOT DETECTED

## 2022-08-24 LAB — C-REACTIVE PROTEIN: CRP: 3.4 mg/dL — ABNORMAL HIGH (ref <1.0)

## 2022-08-24 LAB — MRSA NEXT GEN BY PCR, NASAL: MRSA by PCR Next Gen: DETECTED — AB

## 2022-08-24 LAB — SEDIMENTATION RATE: Sed Rate: 40 mm/hr — ABNORMAL HIGH (ref 0–16)

## 2022-08-24 MED ORDER — LISINOPRIL 10 MG PO TABS: 10.0000 mg | ORAL_TABLET | Freq: Every day | ORAL | Status: DC | PRN

## 2022-08-24 MED ORDER — ONDANSETRON HCL 4 MG PO TABS: 4.0000 mg | ORAL_TABLET | Freq: Four times a day (QID) | ORAL | Status: DC | PRN

## 2022-08-24 MED ORDER — SODIUM CHLORIDE 0.9 % IV SOLN
1.0000 g | INTRAVENOUS | Status: AC
  Administered 2022-08-24 – 2022-08-26 (×3): 1 g via INTRAVENOUS
  Filled 2022-08-24 (×3): qty 10

## 2022-08-24 MED ORDER — METFORMIN HCL 500 MG PO TABS: 1000.0000 mg | ORAL_TABLET | Freq: Every day | ORAL | Status: DC

## 2022-08-24 MED ORDER — GABAPENTIN 300 MG PO CAPS
600.0000 mg | ORAL_CAPSULE | Freq: Three times a day (TID) | ORAL | Status: DC
  Administered 2022-08-25 – 2022-08-27 (×6): 600 mg via ORAL
  Filled 2022-08-24 (×9): qty 2

## 2022-08-24 MED ORDER — NICOTINE 14 MG/24HR TD PT24: 14.0000 mg | MEDICATED_PATCH | Freq: Every day | TRANSDERMAL | Status: DC | PRN

## 2022-08-24 MED ORDER — TRAZODONE HCL 100 MG PO TABS
100.0000 mg | ORAL_TABLET | Freq: Every evening | ORAL | Status: DC | PRN
  Filled 2022-08-24: qty 1

## 2022-08-24 MED ORDER — DULOXETINE HCL 60 MG PO CPEP
60.0000 mg | ORAL_CAPSULE | Freq: Every day | ORAL | Status: DC
  Filled 2022-08-24 (×2): qty 1

## 2022-08-24 MED ORDER — INSULIN ASPART 100 UNIT/ML IJ SOLN
0.0000 [IU] | Freq: Three times a day (TID) | INTRAMUSCULAR | Status: DC
  Administered 2022-08-27: 5 [IU] via SUBCUTANEOUS

## 2022-08-24 MED ORDER — MORPHINE SULFATE (PF) 4 MG/ML IV SOLN
4.0000 mg | INTRAVENOUS | Status: DC | PRN
  Administered 2022-08-24: 4 mg via INTRAVENOUS
  Filled 2022-08-24: qty 1

## 2022-08-24 MED ORDER — SODIUM CHLORIDE 0.45 % IV SOLN: INTRAVENOUS | Status: DC

## 2022-08-24 MED ORDER — RISPERIDONE 1 MG PO TABS
2.0000 mg | ORAL_TABLET | Freq: Every day | ORAL | Status: DC
  Filled 2022-08-24 (×3): qty 2

## 2022-08-24 MED ORDER — ACETAMINOPHEN 325 MG PO TABS: 650.0000 mg | ORAL_TABLET | Freq: Four times a day (QID) | ORAL | Status: DC | PRN

## 2022-08-24 MED ORDER — VANCOMYCIN HCL IN DEXTROSE 1-5 GM/200ML-% IV SOLN: 1000.0000 mg | Freq: Once | INTRAVENOUS | Status: DC

## 2022-08-24 MED ORDER — SODIUM CHLORIDE 0.9 % IV SOLN
8.0000 mg/kg | Freq: Every day | INTRAVENOUS | Status: AC
  Administered 2022-08-24 – 2022-08-26 (×3): 650 mg via INTRAVENOUS
  Filled 2022-08-24 (×3): qty 13

## 2022-08-24 MED ORDER — ONDANSETRON HCL 4 MG/2ML IJ SOLN: 4.0000 mg | Freq: Four times a day (QID) | INTRAMUSCULAR | Status: DC | PRN

## 2022-08-24 MED ORDER — GABAPENTIN 600 MG PO TABS: 600.0000 mg | ORAL_TABLET | Freq: Three times a day (TID) | ORAL | Status: DC

## 2022-08-24 MED ORDER — CHLORHEXIDINE GLUCONATE CLOTH 2 % EX PADS
6.0000 | MEDICATED_PAD | Freq: Every day | CUTANEOUS | Status: DC
  Administered 2022-08-25 – 2022-08-27 (×3): 6 via TOPICAL

## 2022-08-24 MED ORDER — VANCOMYCIN HCL 1250 MG/250ML IV SOLN: 1250.0000 mg | Freq: Two times a day (BID) | INTRAVENOUS | Status: DC

## 2022-08-24 MED ORDER — METFORMIN HCL 500 MG PO TABS
1000.0000 mg | ORAL_TABLET | Freq: Every day | ORAL | Status: DC
  Filled 2022-08-24: qty 2

## 2022-08-24 MED ORDER — MAGNESIUM OXIDE 400 MG PO TABS
800.0000 mg | ORAL_TABLET | Freq: Every day | ORAL | Status: DC
  Filled 2022-08-24: qty 2

## 2022-08-24 MED ORDER — INSULIN GLARGINE-YFGN 100 UNIT/ML ~~LOC~~ SOLN
25.0000 [IU] | Freq: Every day | SUBCUTANEOUS | Status: DC
  Filled 2022-08-24 (×4): qty 0.25

## 2022-08-24 MED ORDER — ACETAMINOPHEN 650 MG RE SUPP: 650.0000 mg | Freq: Four times a day (QID) | RECTAL | Status: DC | PRN

## 2022-08-24 MED ORDER — MUPIROCIN 2 % EX OINT
1.0000 | TOPICAL_OINTMENT | Freq: Two times a day (BID) | CUTANEOUS | Status: DC
  Administered 2022-08-24 – 2022-08-27 (×6): 1 via NASAL
  Filled 2022-08-24: qty 22

## 2022-08-24 MED ORDER — OXYCODONE HCL 5 MG PO TABS
5.0000 mg | ORAL_TABLET | Freq: Four times a day (QID) | ORAL | Status: DC | PRN
  Administered 2022-08-24: 5 mg via ORAL
  Filled 2022-08-24: qty 1

## 2022-08-24 MED ORDER — VANCOMYCIN HCL 1750 MG/350ML IV SOLN
1750.0000 mg | Freq: Once | INTRAVENOUS | Status: AC
  Administered 2022-08-24: 1750 mg via INTRAVENOUS
  Filled 2022-08-24: qty 350

## 2022-08-24 NOTE — Progress Notes (Signed)
Pharmacy Antibiotic Note  Chase Reed is a 56 y.o. male admitted on 08/24/2022 with cellulitis.  Pharmacy has been consulted for vancomycin dosing.  Plan: Vancomycin 1750 mg IV x 1 dose followed by vancomycin 1250 mg IV q12 for est AUC 512.6, Css min 14.1 using SCr 0.99 & VD 0.72 Rocephin 1 gm IV q24 per MD F/u renal function,  Height: 6' (182.9 cm) Weight: 81.6 kg (180 lb) IBW/kg (Calculated) : 77.6  Temp (24hrs), Avg:98.2 F (36.8 C), Min:98 F (36.7 C), Max:98.4 F (36.9 C)  Recent Labs  Lab 08/24/22 0138  WBC 1.9*  CREATININE 0.99    Estimated Creatinine Clearance: 94.7 mL/min (by C-G formula based on SCr of 0.99 mg/dL).    Allergies  Allergen Reactions   Atorvastatin Other (See Comments)    Leg cramps and body aches    Thank you for allowing pharmacy to be a part of this patient's care.  Eudelia Bunch, Pharm.D 08/24/2022 1:29 PM

## 2022-08-24 NOTE — Plan of Care (Signed)
[facility] will assume care on arrival to accepting facility. Until arrival, care as per EDP. However, [facility] available 24/7 for questions and assistance.  Nursing staff, please page [facility] Admits and Consults ([phone]) as soon as the patient arrives to the hospital.   

## 2022-08-24 NOTE — Progress Notes (Signed)
Patient refused insulin administration for cbg of 170 at 1700. Dr. Olevia Bowens notified and patient educated. Will continue to monitor.

## 2022-08-24 NOTE — H&P (Addendum)
History and Physical    Patient: Chase Reed [id number] DOB: [birth year] DOA: 08/24/2022 DOS: the patient was seen and examined on 08/24/2022 PCP: Reyes Ivan, PA-C  Patient coming from: Home  Chief Complaint:  Chief Complaint  Patient presents with   hand swelling   HPI: Chase Reed is a 56 y.o. male with medical history significant of liver cirrhosis secondary to hepatitis C, current IVDA, depression, substance induced mood disorder, sternal osteomyelitis, type 2 diabetes, history of pneumonia, hypertension, history of syncope, nicotine dependence, cholelithiasis who is coming to the emergency department with hand edema and tenderness for the past 2 to 3 days.  Last weekend, he had a pulse still on his right middle finger and he proceeded to incise and drain.  His symptoms got better for several days, but then he started having edema, tenderness and decrease of ROM of the right wrist and finger joints. He denied fever, chills, rhinorrhea, sore throat, wheezing or hemoptysis.  No chest pain, palpitations, diaphoresis, PND, orthopnea or pitting edema of the lower extremities.  No abdominal pain, nausea, emesis, diarrhea, constipation, melena or hematochezia.  No flank pain, dysuria, frequency or hematuria.  No polyuria, polydipsia, polyphagia or blurred vision.   Last night, Dr. Dina Rich spoke to Dr. Apolonio Schneiders who was on-call for hand surgery.  However he recommended MRI of the wrist versus IR arthrocentesis.  I discussed the case with IR on-call (Dr. Vernard Gambles) who recommended to obtain MRI first to further characterize in case arthrocentesis is needed.  ED course: Initial vital signs were temperature 98.3 F, pulse 71, respiration 18, BP 144/91 mmHg and O2 sat 100% on room air.  The patient received morphine 4 mg IVP for pain.  Lab work: His CBC showed a white count 1.9, hemoglobin 10.1 g deciliter platelets 34.  Sed rate was 40 mm/h.  BMP with a glucose of 132 mg/deciliter but  otherwise normal.  UDS was positive for opiates and cocaine.  Imaging: Right hand x-ray showed diffuse soft tissue swelling without fracture or dislocation.   Review of Systems: As mentioned in the history of present illness. All other systems reviewed and are negative. Past Medical History:  Diagnosis Date   Cirrhosis ([place])    Diabetes mellitus without complication ([place])    Hepatitis    Past Surgical History:  Procedure Laterality Date   APPLICATION OF WOUND VAC N/A 12/24/2018   Procedure: APPLICATION OF WOUND VAC;  Surgeon: Gaye Pollack, MD;  Location: [place];  Service: Thoracic;  Laterality: N/A;   APPLICATION OF WOUND VAC N/A 12/27/2018   Procedure: WOUND VAC CHANGE;  Surgeon: Gaye Pollack, MD;  Location: [place];  Service: Thoracic;  Laterality: N/A;   APPLICATION OF WOUND VAC N/A 01/14/2019   Procedure: APPLICATION OF WOUND VAC;  Surgeon: Gaye Pollack, MD;  Location: [place];  Service: Vascular;  Laterality: N/A;   I & D EXTREMITY N/A 01/14/2019   Procedure: DEBRIDEMENT OF STERNOCLAVICULAR JOINT;  Surgeon: Gaye Pollack, MD;  Location: [facility];  Service: Vascular;  Laterality: N/A;   I & D EXTREMITY Right 10/03/2020   Procedure: Irrigation debridement of RIGHT forearm subfascial abscess measuring approximately 6 x 3 x 1.5 cm;  Surgeon: Verner Mould, MD;  Location: [place];  Service: Orthopedics;  Laterality: Right;   LITHOTRIPSY     STERNAL WOUND DEBRIDEMENT N/A 12/24/2018   Procedure: I&D CHEST WALL ABSCESS;  Surgeon: Gaye Pollack, MD;  Location: [place];  Service: Thoracic;  Laterality: N/A;   STERNAL WOUND DEBRIDEMENT N/A 12/27/2018   Procedure: DEBRIDEMENT OF CHEST WALL WOUND;  Surgeon: Gaye Pollack, MD;  Location: [facility];  Service: Thoracic;  Laterality: N/A;   TEE WITHOUT CARDIOVERSION N/A 01/03/2019   Procedure: TRANSESOPHAGEAL ECHOCARDIOGRAM (TEE);  Surgeon: Sanda Klein, MD;  [facility];  Service: Cardiovascular;  Laterality: N/A;   Social History:   reports that he has been smoking cigarettes. He has a 7.50 pack-year smoking history. He has quit using smokeless tobacco. He reports current drug use. Drugs: Heroin and IV. He reports that he does not drink alcohol.  Allergies  Allergen Reactions   Atorvastatin Other (See Comments)    Leg cramps and body aches    Family History  Problem Relation Age of Onset   COPD Father     Prior to Admission medications   Medication Sig Start Date End Date Taking? Authorizing Provider  atomoxetine (STRATTERA) 80 MG capsule Take 1 capsule (80 mg total) by mouth daily. 12/16/21   Salley Slaughter, NP  doxycycline (VIBRAMYCIN) 100 MG capsule Take 1 capsule (100 mg total) by mouth 2 (two) times daily. One po bid x 7 days 06/16/22   Deno Etienne, DO  DULoxetine (CYMBALTA) 60 MG capsule Take 1 capsule (60 mg total) by mouth daily. 10/22/21   Salley Slaughter, NP  gabapentin (NEURONTIN) 600 MG tablet Take 600 mg by mouth in the morning, at noon, in the evening, and at bedtime.  01/20/19   [provider]  insulin aspart (NOVOLOG) 100 UNIT/ML injection Inject 2-8 Units into the skin See admin instructions. Take 2-8 units as needed  Sliding scale.over 250--2 units over 300--4 units over 350--6 units over 400--8 units Up to 50 units daily: For diabetes management 10/20/21   Aline August, MD  insulin glargine (LANTUS) 100 UNIT/ML injection Inject 30 Units into the skin daily.     [provider]  Iron, Ferrous Sulfate, 325 (65 Fe) MG TABS Take 325 mg by mouth daily. 09/23/19   Orson Slick, MD  JANUVIA 100 MG tablet Take 100 mg by mouth daily. 05/07/21   [provider]  metFORMIN (GLUCOPHAGE) 500 MG tablet Take by mouth 2 (two) times daily with a meal.    [provider]  ondansetron (ZOFRAN-ODT) 4 MG disintegrating tablet 4 mg every 8 (eight) hours as needed. 11/25/21   [provider]  ondansetron (ZOFRAN-ODT) 8 MG disintegrating tablet dissolve 1 tablet(s)  by mouth sublingually EVERY 8 hours as needed for nausea 01/13/22   [provider]  oxyCODONE-acetaminophen (PERCOCET) 10-325 MG tablet SMARTSIG:1 Tablet(s) By Mouth Every 12 Hours 11/26/21   [provider]  sertraline (ZOLOFT) 100 MG tablet Take 100 mg by mouth daily. 03/11/21   [provider]  traZODone (DESYREL) 50 MG tablet Take 2 tablets (100 mg total) by mouth at bedtime as needed for sleep. 10/30/21   Salley Slaughter, NP    Physical Exam: Vitals:   08/24/22 0600 08/24/22 0747 08/24/22 0800 08/24/22 0944  BP: (!) 171/79 135/87 121/71 113/68  Pulse: 66 65 (!) 55 (!) 56  Resp: 17 18 15 16   Temp:  98.4 F (36.9 C) 98 F (36.7 C) 98.1 F (36.7 C)  TempSrc:  Oral  Oral  SpO2: 99% 99% 99% 94%  Weight:      Height:       Physical Exam Vitals and nursing note reviewed.  Constitutional:      Appearance: Normal appearance.  HENT:     Head: Normocephalic.     Nose: No rhinorrhea.     Mouth/Throat:     Mouth: Mucous membranes are moist.  Eyes:     General: No scleral icterus.    Pupils: Pupils are equal, round, and reactive to light.  Cardiovascular:     Rate and Rhythm: Normal rate and regular rhythm.  Pulmonary:     Effort: Pulmonary effort is normal.     Breath sounds: Normal breath sounds.  Abdominal:     General: Bowel sounds are normal. There is no distension.     Palpations: Abdomen is soft.     Tenderness: There is no abdominal tenderness.  Musculoskeletal:     Cervical back: Neck supple.     Right lower leg: No edema.     Left lower leg: No edema.  Skin:    General: Skin is warm and dry.     Findings: Erythema and lesion present.  Neurological:     General: No focal deficit present.     Mental Status: He is alert and oriented to person, place, and time.  Psychiatric:        Mood and Affect: Mood normal.        Behavior: Behavior normal.      Data Reviewed:  There are no new results to review at this time.  Assessment and  Plan: Principal Problem: Initially admitted with impression of:   Septic arthritis rule out Current diagnosis:   Cellulitis of right hand Admit to MedSurg/inpatient. Oxycodone as needed for pain. Begin ceftriaxone 1 g every 24 hours. Began vancomycin per pharmacy. (Patient had an allergy to vancomycin) Will reach ID for Zyvox or any other recommendation. Follow-up blood culture and sensitivity Follow CBC and CMP in a.m. MRI has resulted. [facility] is reviewing images. They will call us back with their assessment/recommendations.  Active Problems:   Cirrhosis ([place])   Chronic viral hepatitis C (HCC) Monitor LFTs.    Pancytopenia ([place]) Secondary to liver cirrhosis. Monitor CBC.    Essential hypertension Currently not taking the ACE inhibitor. Will resume lisinopril 10 mg p.o. daily. Monitor BP, renal function electrolytes.    Type 2 diabetes mellitus (HCC) Carbohydrate modified diet. Last use Lantus 2 days ago. Resume Lantus or formulary equivalent 25 units with breakfast. Resume metformin 1000 mg p.o. twice daily.    MDD (major depressive disorder) On duloxetine 60 mg p.o. daily. Resume risperidone 2 mg p.o. bedtime.     Advance Care Planning:   Code Status: Full Code   Consults:   Family Communication:   Severity of Illness: The appropriate patient status for this patient is INPATIENT. Inpatient status is judged to be reasonable and necessary in order to provide the required intensity of service to ensure the patient's safety. The patient's presenting symptoms, physical exam findings, and initial radiographic and laboratory data in the context of their chronic comorbidities is felt to place them at high risk for further clinical deterioration. Furthermore, it is not anticipated that the patient will be medically stable for discharge from the hospital within 2 midnights of admission.   * I certify that at the point of admission it is my clinical judgment that the  patient will require inpatient hospital care spanning beyond 2 midnights from the point of admission due to high intensity of service, high risk for further deterioration and high frequency of surveillance required.*  Author: Reubin Milan, MD 08/24/2022 11:18 AM  For on call review [email or web address].  This document was prepared using Dragon voice recognition software and may contain some unintended transcription errors.

## 2022-08-24 NOTE — Progress Notes (Signed)
Discussed with Dr. Caralyn Guile. Patient to be NPO after midnight for likely surgery tomorrow. Dr. Caralyn Guile will evaluate patient tomorrow.  Costella Hatcher, PA

## 2022-08-24 NOTE — Progress Notes (Signed)
Patient arrived by wheelchair with ambulance transport from [place]. Patient alert and oriented. Oriented to unit and equipment. Admitting MD paged. Will continue to monitor.

## 2022-08-24 NOTE — ED Provider Notes (Signed)
[facility] EMERGENCY DEPT Provider Note   CSN: [id number] Arrival date & time: 08/23/22  2238     History  Chief Complaint  Patient presents with   hand swelling    SYAIR FRICKER is a 56 y.o. male.  HPI     This is a 56 year old male who presents with right hand and wrist pain and swelling.  Patient is right-handed.  Patient reports several days ago he noted a pustule and abscess on his right index finger.  He was able to pop this and express some purulence.  He thought it was getting better; however, he has had increasing pain and swelling over the dorsum of the right wrist and hand.  He has exquisite pain with range of motion of the wrist.  He is a current IV drug user.  He states he last used 1 week ago.  He has not had any fevers or systemic symptoms.  Home Medications Prior to Admission medications   Medication Sig Start Date End Date Taking? Authorizing Provider  atomoxetine (STRATTERA) 80 MG capsule Take 1 capsule (80 mg total) by mouth daily. 12/16/21   Salley Slaughter, NP  doxycycline (VIBRAMYCIN) 100 MG capsule Take 1 capsule (100 mg total) by mouth 2 (two) times daily. One po bid x 7 days 06/16/22   Deno Etienne, DO  DULoxetine (CYMBALTA) 60 MG capsule Take 1 capsule (60 mg total) by mouth daily. 10/22/21   Salley Slaughter, NP  gabapentin (NEURONTIN) 600 MG tablet Take 600 mg by mouth in the morning, at noon, in the evening, and at bedtime.  01/20/19   [provider]  insulin aspart (NOVOLOG) 100 UNIT/ML injection Inject 2-8 Units into the skin See admin instructions. Take 2-8 units as needed  Sliding scale.over 250--2 units over 300--4 units over 350--6 units over 400--8 units Up to 50 units daily: For diabetes management 10/20/21   Aline August, MD  insulin glargine (LANTUS) 100 UNIT/ML injection Inject 30 Units into the skin daily.     [provider]  Iron, Ferrous Sulfate, 325 (65 Fe) MG TABS Take 325 mg by mouth daily.  09/23/19   Orson Slick, MD  JANUVIA 100 MG tablet Take 100 mg by mouth daily. 05/07/21   [provider]  metFORMIN (GLUCOPHAGE) 500 MG tablet Take by mouth 2 (two) times daily with a meal.    [provider]  ondansetron (ZOFRAN-ODT) 4 MG disintegrating tablet 4 mg every 8 (eight) hours as needed. 11/25/21   [provider]  ondansetron (ZOFRAN-ODT) 8 MG disintegrating tablet dissolve 1 tablet(s) by mouth sublingually EVERY 8 hours as needed for nausea 01/13/22   [provider]  oxyCODONE-acetaminophen (PERCOCET) 10-325 MG tablet SMARTSIG:1 Tablet(s) By Mouth Every 12 Hours 11/26/21   [provider]  sertraline (ZOLOFT) 100 MG tablet Take 100 mg by mouth daily. 03/11/21   [provider]  traZODone (DESYREL) 50 MG tablet Take 2 tablets (100 mg total) by mouth at bedtime as needed for sleep. 10/30/21   Salley Slaughter, NP      Allergies    Atorvastatin    Review of Systems   Review of Systems  Constitutional:  Negative for fever.  Musculoskeletal:        Wrist pain and swelling  Skin:  Positive for color change.  All other systems reviewed and are negative.   Physical Exam Updated Vital Signs BP 118/76   Pulse (!) 58   Temp 98.3 F (36.8  C) (Oral)   Resp 16   Ht 1.829 m (6')   Wt 81.6 kg   SpO2 97%   BMI 24.41 kg/m  Physical Exam Vitals and nursing note reviewed.  Constitutional:      Appearance: He is well-developed. He is not ill-appearing.  HENT:     Head: Normocephalic and atraumatic.     Mouth/Throat:     Mouth: Mucous membranes are moist.  Eyes:     Pupils: Pupils are equal, round, and reactive to light.  Cardiovascular:     Rate and Rhythm: Normal rate and regular rhythm.     Heart sounds: Normal heart sounds. No murmur heard. Pulmonary:     Effort: Pulmonary effort is normal. No respiratory distress.     Breath sounds: Normal breath sounds. No wheezing.  Abdominal:     Palpations: Abdomen is soft.      Tenderness: There is no abdominal tenderness.  Musculoskeletal:     Cervical back: Neck supple.     Comments: Tenderness palpation of the right wrist with diffuse swelling noted, pain with passive range of motion, limited range of motion secondary to pain, track marks noted over the dorsum of the hand, healing wound over the second digit  Lymphadenopathy:     Cervical: No cervical adenopathy.  Skin:    General: Skin is warm and dry.  Neurological:     Mental Status: He is alert and oriented to person, place, and time.  Psychiatric:        Mood and Affect: Mood normal.     ED Results / Procedures / Treatments   Labs (all labs ordered are listed, but only abnormal results are displayed) Labs Reviewed  CBC WITH DIFFERENTIAL/PLATELET - Abnormal; Notable for the following components:      Result Value   WBC 1.9 (*)    RBC 3.40 (*)    Hemoglobin 10.1 (*)    HCT 30.9 (*)    Platelets 34 (*)    Neutro Abs 1.2 (*)    Lymphs Abs 0.4 (*)    All other components within normal limits  BASIC METABOLIC PANEL - Abnormal; Notable for the following components:   Glucose, Bld 132 (*)    All other components within normal limits  SEDIMENTATION RATE - Abnormal; Notable for the following components:   Sed Rate 40 (*)    All other components within normal limits  RAPID URINE DRUG SCREEN, HOSP PERFORMED - Abnormal; Notable for the following components:   Opiates POSITIVE (*)    Cocaine POSITIVE (*)    All other components within normal limits  CULTURE, BLOOD (ROUTINE X 2)  CULTURE, BLOOD (ROUTINE X 2)  C-REACTIVE PROTEIN    EKG None  Radiology DG Hand Complete Right  Result Date: 08/24/2022 CLINICAL DATA:  Right hand pain and swelling EXAM: RIGHT HAND - COMPLETE 3+ VIEW COMPARISON:  None Available. FINDINGS: No acute fracture or dislocation. Healed right fifth metacarpal fracture with minimal residual volar deformity. Moderate degenerative arthritis at at the base of the thumb at the  first carpometacarpal joint. Remaining joint spaces appear preserved. Mild diffuse soft tissue swelling of the right hand and visualized right breast. IMPRESSION: Diffuse soft tissue swelling. No acute fracture or dislocation. Electronically Signed   By: Helyn Numbers M.D.   On: 08/24/2022 01:39    Procedures Procedures    Medications Ordered in ED Medications - No data to display  ED Course/ Medical Decision Making/ A&P Clinical Course as of 08/24/22 [phone]  Wynelle Link Aug 24, 2022  3419 Patient chronically leukopenic and thrombocytopenic.  Platelets today 34.  Feel this is a contraindication to arthrocentesis of the right wrist.  Sed rate is elevated to 40.  We will consult with orthopedics regarding next steps for management given concern for septic arthritis. [CH]  [phone] Spoke to Dr. Melvyn Novas who recommends arthrocentesis by radiology or MRI to look for fluid.  Does not give recommendations regarding antibiotics. Will discuss with hospitalist. [CH]    Clinical Course User Index [CH] Sharlee Rufino, Mayer Masker, MD                           Medical Decision Making Amount and/or Complexity of Data Reviewed Labs: ordered. Radiology: ordered.  Risk Decision regarding hospitalization.   This patient presents to the ED for concern of wrist and hand pain, this involves an extensive number of treatment options, and is a complaint that carries with it a high risk of complications and morbidity.  I considered the following differential and admission for this acute, potentially life threatening condition.  The differential diagnosis includes septic arthritis, cellulitis  MDM:    This is a 56 year old male who presents with right wrist and hand pain.  He is nontoxic.  He is afebrile.  He has significant findings including swelling of the right wrist and passive range of motion pain.  Findings in addition to his history of IV drug use are concerning for possible septic arthritis.  Clinically he is well-appearing  and afebrile.  Labs obtained.  Labs showed pancytopenia which is consistent with prior labs with patient.  He has previously seen hematology and oncology and they attribute this to his hepatitis C.  Thrombocytopenia precludes safe arthrocentesis.  Sed rate is 40.  I discussed this with hand surgery, Dr. Melvyn Novas.  He recommends MRI and/or interventional radiology evaluation for drainage.  I doubt interventional radiology drain with platelets of 34,000.  For admission to the hospitalist service.  We will hold antibiotics at this time until definitive fluid collection can be identified and/or drained as the patient is otherwise well-appearing.  (Labs, imaging, consults)  Labs: I Ordered, and personally interpreted labs.  The pertinent results include: CBC, BMP, sed rate, CRP, blood cultures, UDS  Imaging Studies ordered: I ordered imaging studies including right wrist x-ray I independently visualized and interpreted imaging. I agree with the radiologist interpretation  Additional history obtained from chart review.  External records from outside source obtained and reviewed including hematology and oncology notes  Cardiac Monitoring: The patient was maintained on a cardiac monitor.  I personally viewed and interpreted the cardiac monitored which showed an underlying rhythm of: Sinus rhythm  Reevaluation: After the interventions noted above, I reevaluated the patient and found that they have :stayed the same  Social Determinants of Health: Lives independently, polysubstance abuse  Disposition: Admit  Co morbidities that complicate the patient evaluation  Past Medical History:  Diagnosis Date   Cirrhosis (HCC)    Diabetes mellitus without complication (HCC)    Hepatitis      Medicines No orders of the defined types were placed in this encounter.   I have reviewed the patients home medicines and have made adjustments as needed  Problem List / ED Course: Problem List Items Addressed  This Visit   None Visit Diagnoses     Pyogenic arthritis of right wrist, due to unspecified organism [facility])    -  Primary  Final Clinical Impression(s) / ED Diagnoses Final diagnoses:  Pyogenic arthritis of right wrist, due to unspecified organism [facility])    Rx / DC Orders ED Discharge Orders     None         Kamen Hanken, Mayer Masker, MD 08/24/22 [phone]

## 2022-08-24 NOTE — Progress Notes (Signed)
Pharmacy Antibiotic Note  Chase Reed is a 56 y.o. male with hx IVDU, sternal osteomyelitis, hepatitis C who presented to the ED on 08/24/2022 with c/o worsening of right hand pain and swelling. MRI of right wrist showed showed findings with suspicion for a small abscess.  He was started on vancomycin and ceftriaxone on admission.  Pharmacy has been consulted to change vancomycin to daptomycin for cellulitis and possible septic joint.   Plan: - Daptomycin 8 mg/kg q24h - ceftriaxone 1gm q24h for per MD - CK QMonday starting on 10/23 _____________________________________  Height: 6' (182.9 cm) Weight: 81.6 kg (180 lb) IBW/kg (Calculated) : 77.6  Temp (24hrs), Avg:98.2 F (36.8 C), Min:98 F (36.7 C), Max:98.4 F (36.9 C)  Recent Labs  Lab 08/24/22 0138  WBC 1.9*  CREATININE 0.99    Estimated Creatinine Clearance: 94.7 mL/min (by C-G formula based on SCr of 0.99 mg/dL).    Allergies  Allergen Reactions   Atorvastatin Other (See Comments)    Leg cramps and body aches     Thank you for allowing pharmacy to be a part of this patient's care.  Lynelle Doctor 08/24/2022 4:43 PM

## 2022-08-24 NOTE — ED Notes (Signed)
He ambulates to the truck and is take by [facility] at this time.

## 2022-08-25 ENCOUNTER — Inpatient Hospital Stay (HOSPITAL_COMMUNITY): Payer: Medicaid Other | Admitting: Certified Registered Nurse Anesthetist

## 2022-08-25 ENCOUNTER — Encounter (HOSPITAL_COMMUNITY)
Admission: EM | Disposition: A | Discharge status: Home / Self Care | Payer: Self-pay | Source: Home / Self Care | Attending: Internal Medicine

## 2022-08-25 ENCOUNTER — Encounter (HOSPITAL_COMMUNITY): Payer: Self-pay | Admitting: Internal Medicine

## 2022-08-25 ENCOUNTER — Other Ambulatory Visit: Payer: Self-pay

## 2022-08-25 DIAGNOSIS — M659 Synovitis and tenosynovitis, unspecified: Secondary | ICD-10-CM

## 2022-08-25 DIAGNOSIS — D61818 Other pancytopenia: Secondary | ICD-10-CM

## 2022-08-25 DIAGNOSIS — M009 Pyogenic arthritis, unspecified: Secondary | ICD-10-CM | POA: Diagnosis not present

## 2022-08-25 DIAGNOSIS — L02511 Cutaneous abscess of right hand: Secondary | ICD-10-CM | POA: Diagnosis not present

## 2022-08-25 DIAGNOSIS — Z794 Long term (current) use of insulin: Secondary | ICD-10-CM

## 2022-08-25 DIAGNOSIS — F1990 Other psychoactive substance use, unspecified, uncomplicated: Secondary | ICD-10-CM | POA: Diagnosis not present

## 2022-08-25 DIAGNOSIS — B182 Chronic viral hepatitis C: Secondary | ICD-10-CM | POA: Diagnosis not present

## 2022-08-25 DIAGNOSIS — K7460 Unspecified cirrhosis of liver: Secondary | ICD-10-CM

## 2022-08-25 DIAGNOSIS — F1910 Other psychoactive substance abuse, uncomplicated: Secondary | ICD-10-CM

## 2022-08-25 DIAGNOSIS — E1142 Type 2 diabetes mellitus with diabetic polyneuropathy: Secondary | ICD-10-CM

## 2022-08-25 DIAGNOSIS — I10 Essential (primary) hypertension: Secondary | ICD-10-CM

## 2022-08-25 DIAGNOSIS — F329 Major depressive disorder, single episode, unspecified: Secondary | ICD-10-CM

## 2022-08-25 LAB — COMPREHENSIVE METABOLIC PANEL
ALT: 25 U/L (ref 0–44)
AST: 26 U/L (ref 15–41)
Albumin: 3.5 g/dL (ref 3.5–5.0)
Alkaline Phosphatase: 86 U/L (ref 38–126)
Anion gap: 6 (ref 5–15)
BUN: 17 mg/dL (ref 6–20)
CO2: 28 mmol/L (ref 22–32)
Calcium: 8.9 mg/dL (ref 8.9–10.3)
Chloride: 103 mmol/L (ref 98–111)
Creatinine, Ser: 0.81 mg/dL (ref 0.61–1.24)
GFR, Estimated: >60 mL/min (ref >60)
Glucose, Bld: 161 mg/dL — ABNORMAL HIGH (ref 70–99)
Potassium: 4.1 mmol/L (ref 3.5–5.1)
Sodium: 137 mmol/L (ref 135–145)
Total Bilirubin: 0.3 mg/dL (ref 0.3–1.2)
Total Protein: 7 g/dL (ref 6.5–8.1)

## 2022-08-25 LAB — GLUCOSE, CAPILLARY
Glucose-Capillary: 127 mg/dL — ABNORMAL HIGH (ref 70–99)
Glucose-Capillary: 132 mg/dL — ABNORMAL HIGH (ref 70–99)
Glucose-Capillary: 130 mg/dL — ABNORMAL HIGH (ref 70–99)
Glucose-Capillary: 193 mg/dL — ABNORMAL HIGH (ref 70–99)

## 2022-08-25 LAB — CBC
HCT: 30.2 % — ABNORMAL LOW (ref 39.0–52.0)
Hemoglobin: 9.6 g/dL — ABNORMAL LOW (ref 13.0–17.0)
MCH: 29.7 pg (ref 26.0–34.0)
MCHC: 31.8 g/dL (ref 30.0–36.0)
MCV: 93.5 fL (ref 80.0–100.0)
Platelets: 39 10*3/uL — ABNORMAL LOW (ref 150–400)
RBC: 3.23 MIL/uL — ABNORMAL LOW (ref 4.22–5.81)
RDW: 13.5 % (ref 11.5–15.5)
WBC: 1.6 10*3/uL — ABNORMAL LOW (ref 4.0–10.5)
nRBC: 0 % (ref 0.0–0.2)

## 2022-08-25 LAB — CK: Total CK: 91 U/L (ref 49–397)

## 2022-08-25 LAB — HEMOGLOBIN A1C
Hgb A1c MFr Bld: 5.3 % (ref 4.8–5.6)
Mean Plasma Glucose: 105.41 mg/dL

## 2022-08-25 LAB — MAGNESIUM: Magnesium: 1.7 mg/dL (ref 1.7–2.4)

## 2022-08-25 SURGERY — IRRIGATION AND DEBRIDEMENT EXTREMITY
Anesthesia: General | Laterality: Right

## 2022-08-25 MED ORDER — LIDOCAINE HCL (PF) 2 % IJ SOLN
INTRAMUSCULAR | Status: AC
  Filled 2022-08-25: qty 5

## 2022-08-25 MED ORDER — OXYCODONE HCL 5 MG PO TABS: 5.0000 mg | ORAL_TABLET | ORAL | Status: DC | PRN

## 2022-08-25 MED ORDER — SERTRALINE HCL 100 MG PO TABS
100.0000 mg | ORAL_TABLET | Freq: Every day | ORAL | Status: DC
  Filled 2022-08-25: qty 1

## 2022-08-25 MED ORDER — BUPIVACAINE-EPINEPHRINE (PF) 0.25% -1:200000 IJ SOLN
INTRAMUSCULAR | Status: AC
  Filled 2022-08-25: qty 30

## 2022-08-25 MED ORDER — BUPIVACAINE HCL (PF) 0.5 % IJ SOLN
INTRAMUSCULAR | Status: AC
  Filled 2022-08-25: qty 30

## 2022-08-25 MED ORDER — PROPOFOL 10 MG/ML IV BOLUS
INTRAVENOUS | Status: AC
  Filled 2022-08-25: qty 20

## 2022-08-25 MED ORDER — POVIDONE-IODINE 10 % EX SWAB: 2.0000 | Freq: Once | CUTANEOUS | Status: DC

## 2022-08-25 MED ORDER — MAGNESIUM OXIDE -MG SUPPLEMENT 400 (240 MG) MG PO TABS
800.0000 mg | ORAL_TABLET | Freq: Every day | ORAL | Status: DC
  Administered 2022-08-25 – 2022-08-27 (×3): 800 mg via ORAL
  Filled 2022-08-25 (×3): qty 2

## 2022-08-25 MED ORDER — FENTANYL CITRATE (PF) 250 MCG/5ML IJ SOLN
INTRAMUSCULAR | Status: AC
  Filled 2022-08-25: qty 5

## 2022-08-25 MED ORDER — CHLORHEXIDINE GLUCONATE 4 % EX LIQD: 60.0000 mL | Freq: Once | CUTANEOUS | Status: DC

## 2022-08-25 MED ORDER — CHLORHEXIDINE GLUCONATE 0.12 % MT SOLN
15.0000 mL | Freq: Once | OROMUCOSAL | Status: AC
  Administered 2022-08-25: 15 mL via OROMUCOSAL

## 2022-08-25 MED ORDER — MORPHINE SULFATE (PF) 4 MG/ML IV SOLN
4.0000 mg | INTRAVENOUS | Status: DC | PRN
  Administered 2022-08-25 – 2022-08-27 (×7): 4 mg via INTRAVENOUS
  Filled 2022-08-25 (×7): qty 1

## 2022-08-25 MED ORDER — LACTATED RINGERS IV SOLN: INTRAVENOUS | Status: DC

## 2022-08-25 MED ORDER — MIDAZOLAM HCL 2 MG/2ML IJ SOLN
INTRAMUSCULAR | Status: AC
  Filled 2022-08-25: qty 2

## 2022-08-25 SURGICAL SUPPLY — 39 items
BAG COUNTER SPONGE SURGICOUNT (BAG) IMPLANT
BAG ZIPLOCK 12X15 (MISCELLANEOUS) IMPLANT
BLADE SURG SZ10 CARB STEEL (BLADE) IMPLANT
BNDG ELASTIC 4X5.8 VLCR STR LF (GAUZE/BANDAGES/DRESSINGS) IMPLANT
BNDG ESMARK 4X9 LF (GAUZE/BANDAGES/DRESSINGS) IMPLANT
BNDG GAUZE DERMACEA FLUFF 4 (GAUZE/BANDAGES/DRESSINGS) IMPLANT
COVER MAYO STAND STRL (DRAPES) ×1 IMPLANT
COVER SURGICAL LIGHT HANDLE (MISCELLANEOUS) ×1 IMPLANT
CUFF TOURN SGL QUICK 18X4 (TOURNIQUET CUFF) IMPLANT
DRAIN PENROSE 0.5X18 (DRAIN) IMPLANT
DRAPE SURG 17X11 SM STRL (DRAPES) IMPLANT
DRSG EMULSION OIL 3X3 NADH (GAUZE/BANDAGES/DRESSINGS) IMPLANT
ELECT REM PT RETURN 15FT ADLT (MISCELLANEOUS) IMPLANT
GAUZE PAD ABD 8X10 STRL (GAUZE/BANDAGES/DRESSINGS) IMPLANT
GAUZE SPONGE 4X4 12PLY STRL (GAUZE/BANDAGES/DRESSINGS) IMPLANT
GLOVE SURG ORTHO 8.0 STRL STRW (GLOVE) ×1 IMPLANT
GOWN STRL REUS W/ TWL XL LVL3 (GOWN DISPOSABLE) ×1 IMPLANT
GOWN STRL REUS W/TWL XL LVL3 (GOWN DISPOSABLE) ×1
IV LACTATED RINGER IRRG 3000ML (IV SOLUTION)
IV LR IRRIG 3000ML ARTHROMATIC (IV SOLUTION) IMPLANT
KIT BASIN OR (CUSTOM PROCEDURE TRAY) ×1 IMPLANT
KIT TURNOVER KIT A (KITS) IMPLANT
MANIFOLD NEPTUNE II (INSTRUMENTS) IMPLANT
PACK ORTHO EXTREMITY (CUSTOM PROCEDURE TRAY) ×1 IMPLANT
PAD CAST 4YDX4 CTTN HI CHSV (CAST SUPPLIES) IMPLANT
PADDING CAST COTTON 4X4 STRL (CAST SUPPLIES)
PENCIL SMOKE EVACUATOR (MISCELLANEOUS) IMPLANT
PROTECTOR NERVE ULNAR (MISCELLANEOUS) IMPLANT
SOL PREP POV-IOD 4OZ 10% (MISCELLANEOUS) IMPLANT
SOL SCRUB PVP POV-IOD 4OZ 7.5% (MISCELLANEOUS)
SOLUTION SCRB POV-IOD 4OZ 7.5% (MISCELLANEOUS) IMPLANT
SUT PROLENE 3 0 PS 2 (SUTURE) IMPLANT
SUT VIC AB 1 CT1 27 (SUTURE)
SUT VIC AB 1 CT1 27XBRD ANTBC (SUTURE) IMPLANT
SUT VIC AB 2-0 CT1 27 (SUTURE)
SUT VIC AB 2-0 CT1 27XBRD (SUTURE) IMPLANT
SYR 20ML LL LF (SYRINGE) IMPLANT
SYR CONTROL 10ML LL (SYRINGE) IMPLANT
TOWEL OR 17X26 10 PK STRL BLUE (TOWEL DISPOSABLE) ×1 IMPLANT

## 2022-08-25 NOTE — OR Nursing (Signed)
Irrigation and Debridement of Right wrist in OR canceled.

## 2022-08-25 NOTE — Consult Note (Signed)
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 Hand surgery following and planned I&D  10/22 admission bcx ngtd 10/22 mrsa  nares screen positive   Suspect skin popping/ivdu causing contiguous infection    Plan: Continue empiric abx Await hand surgery I&D operative finding; please send cultures Duration and final abx choice await cultures/operative finding For health care maintenance purpose, will resend hep b/c and hiv screen Discussed with primary team    I spent 75 minute reviewing data/chart, and coordinating care and >50% direct face to face time providing counseling/discussing diagnostics/treatment plan with patient   ------------------------------------------------ Principal Problem:   Septic arthritis ([place]) Active Problems:   Cirrhosis ([place])   Pancytopenia ([place])   Chronic viral hepatitis C ([place])   Essential hypertension   Type 2 diabetes mellitus ([place])   MDD (major depressive disorder)   Cellulitis of right hand    HPI: Chase Reed is a 57 y.o. 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 He noticed associated swelling as well at the right wrist  He is active in ivdu. Denies injecting in the  dorsum of the wrist although there is a sinus tract/scar over it  This admission bcx so far negative He was started on empiric abx Mri showed deep tissue involvement but no clear sign of septic arthritis. Hand surgery planning I&D today  He hasn't bee febrile or showing sign of sepsis otherwise this admission  Per chart, he has hx hep c treated in early 2022. There is liver cirrhosis which he appears to be compensated (no encephalopathy/ascites). He does have pancytopenia likely from cirrhosis   Family History  Problem Relation Age of Onset   COPD Father     Social History   Tobacco Use   Smoking status: Some Days    Packs/day: 0.25    Years: 30.00    Total pack years: 7.50    Types: Cigarettes   Smokeless tobacco: Former  Scientific laboratory technician Use: Never used  Substance Use Topics   Alcohol use: No   Drug use: Yes    Types: Heroin, IV    Comment: Fentanyl and "Oxy" per patient     Allergies  Allergen Reactions   Atorvastatin Other (See Comments)    Leg cramps and body aches    Review of Systems: ROS All Other ROS was negative, except mentioned above   Past Medical History:  Diagnosis Date   Cirrhosis ([place])    Diabetes mellitus without complication (HCC)    Hepatitis        Scheduled Meds:  chlorhexidine  60 mL Topical Once   Chlorhexidine Gluconate Cloth  6 each Topical Q0600   DULoxetine  60 mg Oral Daily   gabapentin  600 mg Oral TID   insulin aspart  0-15 Units Subcutaneous TID WC   insulin glargine-yfgn  25 Units Subcutaneous Q breakfast   magnesium oxide  800 mg Oral Daily   metFORMIN  1,000 mg Oral Q breakfast   mupirocin ointment  1 Application Nasal BID   povidone-iodine  2 Application Topical Once   risperiDONE  2 mg Oral QHS   Continuous Infusions:  sodium chloride 100 mL/hr at 08/24/22 1717   cefTRIAXone (ROCEPHIN)  IV 1 g (08/24/22 1735)   DAPTOmycin (CUBICIN) 650 mg in sodium chloride 0.9 % IVPB 650 mg (08/24/22 2238)   PRN  Meds:.acetaminophen **OR** acetaminophen, lisinopril, morphine injection **OR** oxyCODONE, nicotine, ondansetron **OR** ondansetron (ZOFRAN) IV, traZODone   OBJECTIVE: Blood pressure (!) 161/111, pulse 74, temperature 98.1 F (36.7 C), temperature source Oral, resp. rate 18, height 6' (1.829 m), weight 81.6 kg, SpO2 100 %.  Physical Exam General/constitutional: no distress, pleasant HEENT: Normocephalic, PER, Conj Clear, EOMI, Oropharynx clear Neck supple CV: rrr no mrg Lungs: clear to auscultation, normal respiratory effort Abd: Soft, Nontender Ext: no edema Skin: No Rash Neuro: nonfocal MSK: relatively intact right wrist active rom. Minimal wrist swelling/tenderness; there is a papule with central sinus tract that has eschar on top just distal to the carpal-metacarpal joint without purulence      Lab Results Lab Results  Component Value Date   WBC 1.6 (L) 08/25/2022   HGB 9.6 (L) 08/25/2022   HCT 30.2 (L) 08/25/2022   MCV 93.5 08/25/2022   PLT 39 (L) 08/25/2022    Lab Results  Component Value Date   CREATININE 0.81 08/25/2022   BUN 17 08/25/2022   NA 137 08/25/2022   K 4.1 08/25/2022   CL 103 08/25/2022   CO2 28 08/25/2022    Lab Results  Component Value Date   ALT 25 08/25/2022   AST 26 08/25/2022   ALKPHOS 86 08/25/2022   BILITOT 0.3 08/25/2022      Microbiology: Recent Results (from the past 240 hour(s))  Blood culture (routine x 2)     Status: None (Preliminary result)   Collection Time: 08/24/22  3:58 AM   Specimen: BLOOD  Result Value Ref Range Status   Specimen Description   Final    BLOOD LEFT ANTECUBITAL Performed at [facility], [address] [place], [place] [address]    Special Requests   Final    BOTTLES DRAWN AEROBIC AND ANAEROBIC Blood Culture adequate volume Performed at [facility], [address] [place], [place] [address]    Culture   Final    NO GROWTH < 24 HOURS Performed at [facility], [address], [place], [place] [address]    Report Status PENDING  Incomplete  MRSA Next Gen by PCR, Nasal     Status: Abnormal   Collection Time: 08/24/22  6:50 PM   Specimen: Nasal Mucosa; Nasal Swab  Result Value Ref Range Status   MRSA by PCR Next Gen DETECTED (A) NOT DETECTED Final    Comment: RESULT CALLED TO, READ BACK BY AND VERIFIED WITH: Yetta Barre, R @ [id number] JMK (NOTE) The GeneXpert MRSA Assay (FDA approved for NASAL specimens only), is one component of a comprehensive MRSA colonization surveillance program. It is not intended to diagnose MRSA infection nor to guide or monitor treatment for MRSA infections. Test performance is not FDA approved in patients  less than 97 years old. Performed at [facility], [address] [place], [place] [address]      Serology:    Imaging: If present, new imagings (plain films, ct scans, and mri) have been personally visualized and interpreted; radiology reports have been reviewed. Decision making incorporated into the Impression / Recommendations.  10/22 mri right hand 1. Generalized subcutaneous edema. Slightly more focal site of fluid within the superficial soft tissues dorsal to the second [facility] joint measuring up to 9 mm, which may represent a small abscess. 2. Mild tenosynovitis of the first, second, and third extensor compartments. 3. 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      Raymondo Band, MD [facility] [phone] pager    08/25/2022, 3:03 PM

## 2022-08-25 NOTE — Assessment & Plan Note (Signed)
.   Resume patients home regimen of oral antihypertensives . Titrate antihypertensive regimen as necessary to achieve adequate BP control . PRN intravenous antihypertensives for excessively elevated blood pressure   

## 2022-08-25 NOTE — Assessment & Plan Note (Addendum)
   Home regimen of psychotropic agents

## 2022-08-25 NOTE — Assessment & Plan Note (Addendum)
   Ongoing intravenous opiate drug abuse makes managing patient's pain with analgesics a challenge  Patient has very legitimate severe pain of the right hand  We are limiting opiate-based analgesics to what is already ordered  Counseling patient on cessation daily

## 2022-08-25 NOTE — Assessment & Plan Note (Addendum)
   Patient had presented with swelling of the right hand, tenderness, decreased range of motion of the right wrist and finger joints as well as pain.  Plain films done of the right hand show diffuse soft tissue swelling without fracture or dislocation.    Hand surgery, Dr. Caralyn Guile, was consulted on the patient and assessed patient, MRI of the right wrist was obtained which showed generalized subcutaneous edema, slightly more focal site of fluid within the superficial soft tissues dorsal to the second [facility] joint measuring up to 9 mm which may represent a small abscess.  Mild tenosynovitis of the first second and third extensor compartments.  Moderate osteoarthritis of the first [place] joint.  Patient initially placed empirically on IV antibiotic of Rocephin and daptomycin, ID consulted.  Patient monitored and followed by hand surgeon.  Initially irrigation and debridement of the right hand and wrist was planned however as patient improved clinically per hand surgeon it was felt irrigation and debridement was no longer needed as patient was improving clinically on antibiotics.  Blood cultures obtained were negative.  Patient was followed by ID and as patient improved clinically was subsequently transitioned to oral doxycycline 100 mg twice daily and cefadroxil 1 g p.o. twice daily for plan of 4 weeks of treatment until 09/24/2022.  Patient will follow-up with ID, Dr. Gale Journey on 09/17/2022 at 3:30 PM.  Patient was discharged in stable and improved condition.

## 2022-08-25 NOTE — Assessment & Plan Note (Signed)
·   Please see assessment and plan above °

## 2022-08-25 NOTE — Progress Notes (Signed)
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  Upon evaluation in the emergency department EDP was concerned for septic arthritis and case was discussed with Dr. Orlan Leavens with orthopedic hand.  Tentative plan is for patient to go to the operating room for intervention on 10/23.  Hospitalist group was then called to assess the patient for mission of the hospital.   Assessment and Plan: * Tenosynovitis of right hand Patient suffering from tenosynovitis of the right hand with associated small abscess of the second digit of the right hand This is in the setting of ongoing intravenous drug abuse Patient wants to go for operative intervention earlier today however due to dramatic clinical improvement in the infection with empiric antibiotics alone surgery is opted not to proceed with surgical drainage/debridement. Infectious disease additionally following, their input is appreciated Continuing empiric intravenous antibiotics for now Blood cultures remain negative If blood cultures remain without growth we will look to infectious disease for guidance on de-escalation of antibiotics  Abscess of finger of right hand Please see assessment and plan above  Intravenous drug abuse (HCC) Ongoing intravenous opiate drug abuse makes managing patient's pain with analgesics a challenge Patient has very legitimate severe pain of the right hand We will attempt to titrate opiate-based analgesics as  appropriate Counseling patient on cessation  Chronic hepatitis C with cirrhosis (HCC) Longstanding history of chronic hepatitis C with cirrhosis complicated by pancytopenia Patient has been evaluated as an outpatient with Dr. Leonides Schanz with hematology concerning this in the past.  It is felt the patient's substantial pancytopenia secondary to his patient's significant splenomegaly No clinical evidence of bleeding  Essential hypertension Resume patients home regimen of oral antihypertensives Titrate antihypertensive regimen as necessary to achieve adequate BP control PRN intravenous antihypertensives for excessively elevated blood pressure    Pancytopenia (HCC) Patient has been evaluated for this in the past by Dr. Leonides Schanz in the outpatient setting Pan cytopenia felt to be secondary to patient's cirrhosis and severe splenomegaly Supportive care  Type 2 diabetes mellitus with diabetic polyneuropathy, with long-term current use of insulin (HCC) Resuming patient's home regimen of long-acting insulin therapy Accu-Cheks before every meal and nightly with sliding scale insulin Hemoglobin A1c found to be 5.3%. Diabetic diet  MDD (major depressive disorder) Home regimen of psychotropic agents    Subjective:  Patient complaining of moderate pain of the digits of the right hand.  Pain is sharp in quality, worse with movement of the digits and radiating proximally.  Pain is substantially proved compared to yesterday.  Physical Exam:  Vitals:   08/25/22 0545 08/25/22 1018 08/25/22 1241 08/25/22 1521  BP: 136/77 135/88 (!) 161/111 126/88  Pulse: 60 62 74 (!) 54  Resp: 16 16 18 16   Temp: 98.4 F (36.9 C) 97.8 F (36.6 C) 98.1 F (36.7 C) 98 F (36.7 C)  TempSrc: Oral Oral Oral Oral  SpO2: 100% 99% 100% 97%  Weight:      Height:         Constitutional: Awake alert and oriented x3, no associated distress.   Skin: Notable redness warmth induration  and swelling of the right hand that  is apparently substantially improved compared to yesterday.  Notable excoriations and track marks over the extremities.  Additionally there are small erythematous nodules located in the groin.  No drainage or foul smell. Eyes: Pupils are equally reactive to light.  No evidence of scleral icterus or conjunctival pallor.  ENMT: Moist mucous membranes noted.  Posterior pharynx clear of any exudate or lesions.   Respiratory: clear to auscultation bilaterally, no wheezing, no crackles. Normal respiratory effort. No accessory muscle use.  Cardiovascular: Regular rate and rhythm, 2 out of 6 systolic murmur noted, no extremity edema. 2+ pedal pulses. No carotid bruits.  Abdomen: Abdomen is soft and nontender.  No evidence of intra-abdominal masses.  Positive bowel sounds noted in all quadrants.   Musculoskeletal: No joint deformity upper and lower extremities. Good ROM, no contractures. Normal muscle tone.    Data Reviewed:  I have personally reviewed and interpreted labs, imaging.  Significant findings are   CBC: Recent Labs  Lab 08/24/22 0138 08/25/22 0344  WBC 1.9* 1.6*  NEUTROABS 1.2*  --   HGB 10.1* 9.6*  HCT 30.9* 30.2*  MCV 90.9 93.5  PLT 34* 39*   Basic Metabolic Panel: Recent Labs  Lab 08/24/22 0138 08/25/22 0344  NA 136 137  K 4.4 4.1  CL 99 103  CO2 30 28  GLUCOSE 132* 161*  BUN 18 17  CREATININE 0.99 0.81  CALCIUM 9.6 8.9  MG  --  1.7   GFR: Estimated Creatinine Clearance: 115.8 mL/min (by C-G formula based on SCr of 0.81 mg/dL). Liver Function Tests: Recent Labs  Lab 08/24/22 1120 08/25/22 0344  AST 29 26  ALT 27 25  ALKPHOS 84 86  BILITOT 0.5 0.3  PROT 6.8 7.0  ALBUMIN 3.5 3.5    Code Status:  Full code.    Severity of Illness:  The appropriate patient status for this patient is INPATIENT. Inpatient status is judged to be reasonable and necessary in order to provide the required intensity of service to ensure the patient's safety. The patient's  presenting symptoms, physical exam findings, and initial radiographic and laboratory data in the context of their chronic comorbidities is felt to place them at high risk for further clinical deterioration. Furthermore, it is not anticipated that the patient will be medically stable for discharge from the hospital within 2 midnights of admission.   * I certify that at the point of admission it is my clinical judgment that the patient will require inpatient hospital care spanning beyond 2 midnights from the point of admission due to high intensity of service, high risk for further deterioration and high frequency of surveillance required.*  Time spent:  51 minutes  Author:  Vernelle Emerald MD  08/25/2022 8:12 PM

## 2022-08-25 NOTE — Progress Notes (Signed)
The patient was seen and evaluated today.  The hand looks much better.  The patient has good passive and active extension of the wrist and digits.  Do not see any evidence of worsening infection.  The patient did respond to the IV medication.  Patient is going to continue with his IV/oral medication.  Do not recommend surgery today.  I am happy to evaluate the patient if his clinical course turns in the other direction.  Patient agreed to the plan.

## 2022-08-25 NOTE — Assessment & Plan Note (Signed)
   Longstanding history of chronic hepatitis C with cirrhosis complicated by pancytopenia  Patient has been evaluated as an outpatient with Dr. Lorenso Courier with hematology concerning this in the past.  It is felt the patient's substantial pancytopenia secondary to his patient's significant splenomegaly  No clinical evidence of bleeding  Outpatient follow-up.

## 2022-08-25 NOTE — Assessment & Plan Note (Signed)
   Resuming patient's home regimen of long-acting insulin therapy  Accu-Cheks before every meal and nightly with sliding scale insulin  Hemoglobin A1c found to be 5.3%.  Diabetic diet

## 2022-08-25 NOTE — Plan of Care (Signed)
  Problem: Clinical Measurements: Goal: Ability to maintain clinical measurements within normal limits will improve Outcome: Progressing   

## 2022-08-25 NOTE — Progress Notes (Signed)
Chart reviewed Given multiple comorbidities and possible infection/tenosynovitis Will proceed with Irrigation and debridement of right hand and wrist today Continue with npo

## 2022-08-25 NOTE — Anesthesia Preprocedure Evaluation (Deleted)
Anesthesia Evaluation  Patient identified by MRN, date of birth, ID band Patient awake    Reviewed: Allergy & Precautions, NPO status , Patient's Chart, lab work & pertinent test results  History of Anesthesia Complications Negative for: history of anesthetic complications  Airway Mallampati: II  TM Distance: >3 FB Neck ROM: Full    Dental  (+) Poor Dentition, Missing, Chipped, Dental Advisory Given   Pulmonary Current Smoker and Patient abstained from smoking.,  10/03/2020 SARS coronavirus NEG   breath sounds clear to auscultation       Cardiovascular (-) hypertension Rhythm:Regular Rate:Normal  '20 ECHO: EF 60-65%, no significant valvular abnormalities   Neuro/Psych Anxiety Depression Schizophrenia  Neuromuscular disease    GI/Hepatic (+) Cirrhosis     substance abuse (drug use (heroin 1/2-1gm almost daily))  IV drug use, Hepatitis -n/v   Endo/Other  diabetes, Insulin Dependent  Renal/GU negative Renal ROS     Musculoskeletal  (+) Arthritis , Osteoarthritis,  narcotic dependent  Abdominal   Peds  Hematology  (+) Blood dyscrasia, anemia , Thrombocytopenia: plt 31k   Anesthesia Other Findings   Reproductive/Obstetrics                             Anesthesia Physical  Anesthesia Plan  ASA: III  Anesthesia Plan: General   Post-op Pain Management: Gabapentin PO (pre-op)* and Dilaudid IV   Induction: Intravenous  PONV Risk Score and Plan: 1 and Ondansetron and Treatment may vary due to age or medical condition  Airway Management Planned: LMA  Additional Equipment: None  Intra-op Plan:   Post-operative Plan: Extubation in OR  Informed Consent: I have reviewed the patients History and Physical, chart, labs and discussed the procedure including the risks, benefits and alternatives for the proposed anesthesia with the patient or authorized representative who has indicated his/her  understanding and acceptance.     Dental advisory given  Plan Discussed with: CRNA and Surgeon  Anesthesia Plan Comments:         Anesthesia Quick Evaluation

## 2022-08-25 NOTE — Hospital Course (Addendum)
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  Upon evaluation in the emergency department EDP was concerned for septic arthritis and case was discussed with Dr. Apolonio Schneiders with orthopedic hand.  Tentative plan is for patient to go to the operating room for intervention on 10/23.  Hospitalist group was then called to assess the patient for mission of the hospital.  On 10/23 patient's hand appeared to be improving and therefore operative intervention was canceled.  Patient continuing to receive intravenous antibiotic therapy with daptomycin and ceftriaxone at this time.

## 2022-08-25 NOTE — Assessment & Plan Note (Signed)
   Patient has been evaluated for this in the past by Dr. Lorenso Courier in the outpatient setting  Pan cytopenia felt to be secondary to patient's cirrhosis and severe splenomegaly  Supportive care  Outpatient follow-up.

## 2022-08-26 DIAGNOSIS — L02511 Cutaneous abscess of right hand: Secondary | ICD-10-CM | POA: Diagnosis not present

## 2022-08-26 DIAGNOSIS — I10 Essential (primary) hypertension: Secondary | ICD-10-CM | POA: Diagnosis not present

## 2022-08-26 DIAGNOSIS — B182 Chronic viral hepatitis C: Secondary | ICD-10-CM | POA: Diagnosis not present

## 2022-08-26 DIAGNOSIS — M659 Synovitis and tenosynovitis, unspecified: Secondary | ICD-10-CM | POA: Diagnosis not present

## 2022-08-26 LAB — COMPREHENSIVE METABOLIC PANEL
ALT: 22 U/L (ref 0–44)
AST: 25 U/L (ref 15–41)
Albumin: 3.4 g/dL — ABNORMAL LOW (ref 3.5–5.0)
Alkaline Phosphatase: 81 U/L (ref 38–126)
Anion gap: 8 (ref 5–15)
BUN: 12 mg/dL (ref 6–20)
CO2: 26 mmol/L (ref 22–32)
Calcium: 8.9 mg/dL (ref 8.9–10.3)
Chloride: 103 mmol/L (ref 98–111)
Creatinine, Ser: 0.91 mg/dL (ref 0.61–1.24)
GFR, Estimated: >60 mL/min (ref >60)
Glucose, Bld: 102 mg/dL — ABNORMAL HIGH (ref 70–99)
Potassium: 4 mmol/L (ref 3.5–5.1)
Sodium: 137 mmol/L (ref 135–145)
Total Bilirubin: 0.4 mg/dL (ref 0.3–1.2)
Total Protein: 6.9 g/dL (ref 6.5–8.1)

## 2022-08-26 LAB — CBC WITH DIFFERENTIAL/PLATELET
Abs Immature Granulocytes: 0 10*3/uL (ref 0.00–0.07)
Basophils Absolute: 0 10*3/uL (ref 0.0–0.1)
Basophils Relative: 1 %
Eosinophils Absolute: 0 10*3/uL (ref 0.0–0.5)
Eosinophils Relative: 1 %
HCT: 30.1 % — ABNORMAL LOW (ref 39.0–52.0)
Hemoglobin: 9.5 g/dL — ABNORMAL LOW (ref 13.0–17.0)
Immature Granulocytes: 0 %
Lymphocytes Relative: 15 %
Lymphs Abs: 0.3 10*3/uL — ABNORMAL LOW (ref 0.7–4.0)
MCH: 29.6 pg (ref 26.0–34.0)
MCHC: 31.6 g/dL (ref 30.0–36.0)
MCV: 93.8 fL (ref 80.0–100.0)
Monocytes Absolute: 0.2 10*3/uL (ref 0.1–1.0)
Monocytes Relative: 9 %
Neutro Abs: 1.3 10*3/uL — ABNORMAL LOW (ref 1.7–7.7)
Neutrophils Relative %: 74 %
Platelets: 35 10*3/uL — ABNORMAL LOW (ref 150–400)
RBC: 3.21 MIL/uL — ABNORMAL LOW (ref 4.22–5.81)
RDW: 13.5 % (ref 11.5–15.5)
WBC: 1.7 10*3/uL — ABNORMAL LOW (ref 4.0–10.5)
nRBC: 0 % (ref 0.0–0.2)

## 2022-08-26 LAB — HEPATITIS B SURFACE ANTIGEN: Hepatitis B Surface Ag: NONREACTIVE

## 2022-08-26 LAB — GLUCOSE, CAPILLARY
Glucose-Capillary: 164 mg/dL — ABNORMAL HIGH (ref 70–99)
Glucose-Capillary: 163 mg/dL — ABNORMAL HIGH (ref 70–99)
Glucose-Capillary: 157 mg/dL — ABNORMAL HIGH (ref 70–99)
Glucose-Capillary: 149 mg/dL — ABNORMAL HIGH (ref 70–99)

## 2022-08-26 LAB — HEPATITIS B CORE ANTIBODY, TOTAL: Hep B Core Total Ab: NONREACTIVE

## 2022-08-26 LAB — HIV ANTIBODY (ROUTINE TESTING W REFLEX): HIV Screen 4th Generation wRfx: NONREACTIVE

## 2022-08-26 LAB — HEPATITIS B SURFACE ANTIBODY,QUALITATIVE: Hep B S Ab: NONREACTIVE

## 2022-08-26 LAB — MAGNESIUM: Magnesium: 1.8 mg/dL (ref 1.7–2.4)

## 2022-08-26 LAB — RPR: RPR Ser Ql: NONREACTIVE

## 2022-08-26 MED ORDER — CEFADROXIL 500 MG PO CAPS
1000.0000 mg | ORAL_CAPSULE | Freq: Two times a day (BID) | ORAL | Status: DC
  Administered 2022-08-27: 1000 mg via ORAL
  Filled 2022-08-26 (×2): qty 2

## 2022-08-26 MED ORDER — DOXYCYCLINE HYCLATE 100 MG PO TABS
100.0000 mg | ORAL_TABLET | Freq: Two times a day (BID) | ORAL | Status: DC
  Administered 2022-08-27: 100 mg via ORAL
  Filled 2022-08-26 (×2): qty 1

## 2022-08-26 NOTE — Progress Notes (Signed)
[facility]  Date of Admission:  08/24/2022     Abx: 10/22-c ceftriaxone 10/22-c daptomycin                                                       Assessment: 56 yo male with active ivdu, liver cirrhosis due to hx hep c (treated with epclusa by 02/2021), dm2, admitted with a several days right hand pain found on mri to have deep soft tissue infection   Mri showed several digit extensors tenosynovitis and a small abscess dorsum to the mcp joint. Hand surgery following and planned I&D   10/22 admission bcx ngtd 10/22 mrsa  nares screen positive     Suspect skin popping/ivdu causing contiguous infection  -------- 10/24 assessment Hand ortho surgery team following and given improvement do not feel surgery needed If he continues improvement tomorrow would be reasonable to change to oral abx to finish total 4 week of treatment        Plan: Continue ceftriaxone/daptomycin today If tomorrow he continues improving I would change abx to doxycycline 100 mg po bid and cefadroxil 1 gram po bid, and given total 4 weeks starting 10/22 to 11/19 He has follow up appointment with me on 11/15 @ 330 Will reevaluate tomorrow Discussed with primary team     Principal Problem:   Tenosynovitis of right hand Active Problems:   Pancytopenia (HCC)   Chronic hepatitis C with cirrhosis (HCC)   Essential hypertension   Type 2 diabetes mellitus with diabetic polyneuropathy, with long-term current use of insulin (HCC)   MDD (major depressive disorder)   Intravenous drug abuse (HCC)   Abscess of finger of right hand   Allergies  Allergen Reactions   Atorvastatin Other (See Comments)    Leg cramps and body aches    Scheduled Meds:  Chlorhexidine Gluconate Cloth  6 each Topical Q0600   DULoxetine  60 mg Oral Daily   gabapentin  600 mg Oral TID   insulin aspart  0-15 Units Subcutaneous TID WC   insulin glargine-yfgn  25 Units Subcutaneous Q breakfast    magnesium oxide  800 mg Oral Daily   metFORMIN  1,000 mg Oral Q breakfast   mupirocin ointment  1 Application Nasal BID   risperiDONE  2 mg Oral QHS   sertraline  100 mg Oral Daily   Continuous Infusions:  sodium chloride 100 mL/hr at 08/26/22 0649   cefTRIAXone (ROCEPHIN)  IV 1 g (08/26/22 1349)   DAPTOmycin (CUBICIN) 650 mg in sodium chloride 0.9 % IVPB Stopped (08/25/22 2050)   PRN Meds:.acetaminophen **OR** acetaminophen, lisinopril, morphine injection **OR** oxyCODONE, nicotine, ondansetron **OR** ondansetron (ZOFRAN) IV, traZODone   SUBJECTIVE: Doing well No surgery needed No f/c Improved pain No n/v/diarrhea No new joint pain/back pain  Review of Systems: ROS All other ROS was negative, except mentioned above     OBJECTIVE: Vitals:   08/25/22 2156 08/26/22 0108 08/26/22 0525 08/26/22 1317  BP: 124/77 115/67 129/73 (!) 149/91  Pulse: 63 66 66 67  Resp:  16 17 18   Temp: 98 F (36.7 C) 98.2 F (36.8 C) 98.5 F (36.9 C) 98.3 F (36.8 C)  TempSrc: Oral Oral Oral Oral  SpO2: 95% 98% 97% 95%  Weight:      Height:  Body mass index is 24.41 kg/m.  Physical Exam General/constitutional: no distress, pleasant HEENT: Normocephalic, PER, Conj Clear, EOMI, Oropharynx clear Neck supple CV: rrr no mrg Lungs: clear to auscultation, normal respiratory effort Abd: Soft, Nontender Ext: no edema Skin: No Rash Neuro: nonfocal MSK: right wrist full rom minimal tenderness; no purulence out of the scar/eschared over center opening   Lab Results Lab Results  Component Value Date   WBC 1.7 (L) 08/26/2022   HGB 9.5 (L) 08/26/2022   HCT 30.1 (L) 08/26/2022   MCV 93.8 08/26/2022   PLT 35 (L) 08/26/2022    Lab Results  Component Value Date   CREATININE 0.91 08/26/2022   BUN 12 08/26/2022   NA 137 08/26/2022   K 4.0 08/26/2022   CL 103 08/26/2022   CO2 26 08/26/2022    Lab Results  Component Value Date   ALT 22 08/26/2022   AST 25 08/26/2022   ALKPHOS 81  08/26/2022   BILITOT 0.4 08/26/2022      Microbiology: Recent Results (from the past 240 hour(s))  Blood culture (routine x 2)     Status: None (Preliminary result)   Collection Time: 08/24/22  3:58 AM   Specimen: BLOOD  Result Value Ref Range Status   Specimen Description   Final    BLOOD LEFT ANTECUBITAL Performed at [facility], [address] [place], [place] [address]    Special Requests   Final    BOTTLES DRAWN AEROBIC AND ANAEROBIC Blood Culture adequate volume Performed at [facility], [address] [place], [place] [address]    Culture   Final    NO GROWTH 2 DAYS Performed at [facility], [place] [address], [place], [place] [address]    Report Status PENDING  Incomplete  MRSA Next Gen by PCR, Nasal     Status: Abnormal   Collection Time: 08/24/22  6:50 PM   Specimen: Nasal Mucosa; Nasal Swab  Result Value Ref Range Status   MRSA by PCR Next Gen DETECTED (A) NOT DETECTED Final    Comment: RESULT CALLED TO, READ BACK BY AND VERIFIED WITH: Ronnald Ramp, R @ [id number] [id number] JMK (NOTE) The GeneXpert MRSA Assay (FDA approved for NASAL specimens only), is one component of a comprehensive MRSA colonization surveillance program. It is not intended to diagnose MRSA infection nor to guide or monitor treatment for MRSA infections. Test performance is not FDA approved in patients less than 57 years old. Performed at [facility], [place] [address], [place], [place] [address]      Serology:   Imaging: If present, new imagings (plain films, ct scans, and mri) have been personally visualized and interpreted; radiology reports have been reviewed. Decision making incorporated into the Impression / Recommendations.  10/22 mri right hand 1. Generalized subcutaneous edema. Slightly more focal site of fluid within the superficial soft tissues dorsal to the second [facility] joint measuring up to 9 mm, which may represent a small  abscess. 2. Mild tenosynovitis of the first, second, and third extensor compartments. 3. 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   Jabier Mutton, [facility] [place] [phone] pager    08/26/2022, 2:29 PM

## 2022-08-27 ENCOUNTER — Other Ambulatory Visit (HOSPITAL_COMMUNITY): Payer: Self-pay

## 2022-08-27 DIAGNOSIS — I10 Essential (primary) hypertension: Secondary | ICD-10-CM | POA: Diagnosis not present

## 2022-08-27 DIAGNOSIS — B182 Chronic viral hepatitis C: Secondary | ICD-10-CM | POA: Diagnosis not present

## 2022-08-27 DIAGNOSIS — L02511 Cutaneous abscess of right hand: Secondary | ICD-10-CM | POA: Diagnosis not present

## 2022-08-27 DIAGNOSIS — M659 Synovitis and tenosynovitis, unspecified: Secondary | ICD-10-CM | POA: Diagnosis not present

## 2022-08-27 LAB — COMPREHENSIVE METABOLIC PANEL
ALT: 33 U/L (ref 0–44)
AST: 45 U/L — ABNORMAL HIGH (ref 15–41)
Albumin: 3.2 g/dL — ABNORMAL LOW (ref 3.5–5.0)
Alkaline Phosphatase: 98 U/L (ref 38–126)
Anion gap: 9 (ref 5–15)
BUN: 13 mg/dL (ref 6–20)
CO2: 24 mmol/L (ref 22–32)
Calcium: 8.5 mg/dL — ABNORMAL LOW (ref 8.9–10.3)
Chloride: 103 mmol/L (ref 98–111)
Creatinine, Ser: 0.91 mg/dL (ref 0.61–1.24)
GFR, Estimated: >60 mL/min (ref >60)
Glucose, Bld: 200 mg/dL — ABNORMAL HIGH (ref 70–99)
Potassium: 3.9 mmol/L (ref 3.5–5.1)
Sodium: 136 mmol/L (ref 135–145)
Total Bilirubin: 0.3 mg/dL (ref 0.3–1.2)
Total Protein: 6.4 g/dL — ABNORMAL LOW (ref 6.5–8.1)

## 2022-08-27 LAB — GC/CHLAMYDIA PROBE AMP (~~LOC~~) NOT AT ARMC
Chlamydia: NEGATIVE
Comment: NEGATIVE
Comment: NORMAL
Neisseria Gonorrhea: NEGATIVE

## 2022-08-27 LAB — GLUCOSE, CAPILLARY
Glucose-Capillary: 230 mg/dL — ABNORMAL HIGH (ref 70–99)
Glucose-Capillary: 147 mg/dL — ABNORMAL HIGH (ref 70–99)

## 2022-08-27 LAB — MAGNESIUM: Magnesium: 1.8 mg/dL (ref 1.7–2.4)

## 2022-08-27 LAB — HCV RNA DIAGNOSIS, NAA: HCV RNA, Quantitation: NOT DETECTED IU/mL

## 2022-08-27 MED ORDER — NICOTINE 14 MG/24HR TD PT24
14.0000 mg | MEDICATED_PATCH | Freq: Every day | TRANSDERMAL | 0 refills | Status: DC | PRN
  Filled 2022-08-27: qty 28, 28d supply, fill #0

## 2022-08-27 MED ORDER — DOXYCYCLINE HYCLATE 100 MG PO TABS
100.0000 mg | ORAL_TABLET | Freq: Two times a day (BID) | ORAL | 0 refills | Status: AC
  Filled 2022-08-27: qty 50, 25d supply, fill #0

## 2022-08-27 MED ORDER — CEFADROXIL 500 MG PO CAPS
1000.0000 mg | ORAL_CAPSULE | Freq: Two times a day (BID) | ORAL | 0 refills | Status: AC
  Filled 2022-08-27: qty 100, 25d supply, fill #0

## 2022-08-27 MED ORDER — OXYCODONE HCL 5 MG PO TABS
5.0000 mg | ORAL_TABLET | ORAL | 0 refills | Status: DC | PRN
  Filled 2022-08-27: qty 15, 3d supply, fill #0

## 2022-08-27 NOTE — Discharge Summary (Signed)
Physician Discharge Summary  ALCEE SIPOS XTG:[id number] DOB: 14-Mar-1966 DOA: 08/24/2022  PCP: Courtney Heys, PA-C  Admit date: 08/24/2022 Discharge date: 08/27/2022  Time spent: 60 minutes  Recommendations for Outpatient Follow-up:  Follow-up with Dr. Renold Don, ID as scheduled 09/17/2022 at 3:30 PM. Follow-up with Dr. Melvyn Novas, and surgeon/orthopedics as needed. Follow-up with Courtney Heys, PA-C in 2 weeks.  On follow-up patient will need a CBC done to follow-up on counts.  Patient need a basic metabolic profile done to follow-up on electrolytes and renal function.  Patient's diabetes will need to be followed up upon.   Discharge Diagnoses:  Principal Problem:   Tenosynovitis of right hand Active Problems:   Abscess of finger of right hand   Chronic hepatitis C with cirrhosis (HCC)   Intravenous drug abuse (HCC)   Essential hypertension   Pancytopenia (HCC)   Type 2 diabetes mellitus with diabetic polyneuropathy, with long-term current use of insulin (HCC)   MDD (major depressive disorder)   Discharge Condition: Stable and improved.  Diet recommendation: Carb modified diet  Filed Weights   08/23/22 2251  Weight: 81.6 kg    History of present illness:  HPI per Dr. Roel Cluck is a 56 y.o. male with medical history significant of liver cirrhosis secondary to hepatitis C, current IVDA, depression, substance induced mood disorder, sternal osteomyelitis, type 2 diabetes, history of pneumonia, hypertension, history of syncope, nicotine dependence, cholelithiasis who is coming to the emergency department with hand edema and tenderness for the past 2 to 3 days.  Last weekend, he had a pulse still on his right middle finger and he proceeded to incise and drain.  His symptoms got better for several days, but then he started having edema, tenderness and decrease of ROM of the right wrist and finger joints. He denied fever, chills, rhinorrhea, sore throat,  wheezing or hemoptysis.  No chest pain, palpitations, diaphoresis, PND, orthopnea or pitting edema of the lower extremities.  No abdominal pain, nausea, emesis, diarrhea, constipation, melena or hematochezia.  No flank pain, dysuria, frequency or hematuria.  No polyuria, polydipsia, polyphagia or blurred vision.    Last night, Dr. Wilkie Aye spoke to Dr. Orlan Leavens who was on-call for hand surgery.  However he recommended MRI of the wrist versus IR arthrocentesis.  I discussed the case with IR on-call (Dr. Deanne Coffer) who recommended to obtain MRI first to further characterize in case arthrocentesis is needed.   ED course: Initial vital signs were temperature 98.3 F, pulse 71, respiration 18, BP 144/91 mmHg and O2 sat 100% on room air.  The patient received morphine 4 mg IVP for pain.   Lab work: His CBC showed a white count 1.9, hemoglobin 10.1 g deciliter platelets 34.  Sed rate was 40 mm/h.  BMP with a glucose of 132 mg/deciliter but otherwise normal.  UDS was positive for opiates and cocaine.   Imaging: Right hand x-ray showed diffuse soft tissue swelling without fracture or dislocation. Hospital Course:   Assessment and Plan: * Tenosynovitis of right hand Patient had presented with swelling of the right hand, tenderness, decreased range of motion of the right wrist and finger joints as well as pain.  Plain films done of the right hand show diffuse soft tissue swelling without fracture or dislocation.   Hand surgery, Dr. Melvyn Novas, was consulted on the patient and assessed patient, MRI of the right wrist was obtained which showed generalized subcutaneous edema, slightly more focal site of fluid within the superficial soft tissues dorsal  to the second [facility] joint measuring up to 9 mm which may represent a small abscess.  Mild tenosynovitis of the first second and third extensor compartments.  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 Patient initially placed empirically on IV antibiotic of Rocephin and  daptomycin, ID consulted. Patient monitored and followed by hand surgeon.  Initially irrigation and debridement of the right hand and wrist was planned however as patient improved clinically per hand surgeon it was felt irrigation and debridement was no longer needed as patient was improving clinically on antibiotics. Blood cultures obtained were negative. Patient was followed by ID and as patient improved clinically was subsequently transitioned to oral doxycycline 100 mg twice daily and cefadroxil 1 g p.o. twice daily for plan of 4 weeks of treatment until 09/24/2022. Patient will follow-up with ID, Dr. Renold Don on 09/17/2022 at 3:30 PM. Patient was discharged in stable and improved condition.  Abscess of finger of right hand Please see assessment and plan above  Intravenous drug abuse (HCC) Ongoing intravenous opiate drug abuse makes managing patient's pain with analgesics a challenge Patient has very legitimate severe pain of the right hand  opiate-based analgesics was limited and patient maintained on oxycodone as needed.  Patient was discharged on oxycodone every 4 hours as needed # 15 tablets only.  Counselled patient on cessation daily.  Chronic hepatitis C with cirrhosis (HCC) Longstanding history of chronic hepatitis C with cirrhosis complicated by pancytopenia Patient has been evaluated as an outpatient with Dr. Leonides Schanz with hematology concerning this in the past.  It is felt the patient's substantial pancytopenia secondary to his patient's significant splenomegaly No clinical evidence of bleeding Outpatient follow-up.  Essential hypertension Patient was placed back on home regimen of oral antihypertensive medications.  BP controlled during the hospitalization.   Outpatient follow-up with PCP.      Pancytopenia [facility]) Patient has been evaluated for this in the past by Dr. Leonides Schanz in the outpatient setting Pan cytopenia felt to be secondary to patient's cirrhosis and severe  splenomegaly Supportive care Outpatient follow-up.  Type 2 diabetes mellitus with diabetic polyneuropathy, with long-term current use of insulin (HCC) Patient was placed back on home regimen of long-acting insulin as well as SSI.   Hemoglobin A1c found to be 5.3%. Patient maintained on diabetic diet. Outpatient follow-up with PCP.  MDD (major depressive disorder) Home regimen of psychotropic agents were resumed during the hospitalization.        Procedures: Plain films of the right hand 08/24/2022 MRI of the right wrist 08/24/2022  Consultations: Infectious disease: Dr. Renold Don 08/25/2022 Orthopedics/hand surgeon: Dr. Melvyn Novas  Discharge Exam: Vitals:   08/27/22 0534 08/27/22 1313  BP: 126/84 (!) 163/99  Pulse: 62 75  Resp: 19 18  Temp: 98.6 F (37 C) 99.1 F (37.3 C)  SpO2:  100%    General: NAD Cardiovascular: Regular rate rhythm no murmurs rubs or gallops.  No JVD.  No lower extremity edema. Respiratory: Lungs clear to auscultation bilaterally.  No wheezes, no crackles, no rhonchi.  Fair air movement.  Speaking in full sentences.  Discharge Instructions   Discharge Instructions     Diet Carb Modified   Complete by: As directed    Increase activity slowly   Complete by: As directed       Allergies as of 08/27/2022       Reactions   Atorvastatin Other (See Comments)   Leg cramps and body aches        Medication List  TAKE these medications    cefadroxil 500 MG capsule Commonly known as: DURICEF Take 2 capsules (1,000 mg total) by mouth 2 (two) times daily for 25 days.   doxycycline 100 MG tablet Commonly known as: VIBRA-TABS Take 1 tablet (100 mg total) by mouth every 12 (twelve) hours for 25 days.   DULoxetine 60 MG capsule Commonly known as: CYMBALTA Take 1 capsule (60 mg total) by mouth daily.   gabapentin 600 MG tablet Commonly known as: NEURONTIN Take 600 mg by mouth in the morning, at noon, in the evening, and at bedtime.    insulin aspart 100 UNIT/ML injection Commonly known as: novoLOG Inject 2-8 Units into the skin See admin instructions. Take 2-8 units as needed  Sliding scale.over 250--2 units over 300--4 units over 350--6 units over 400--8 units Up to 50 units daily: For diabetes management What changed: additional instructions   insulin glargine 100 UNIT/ML injection Commonly known as: LANTUS Inject 25-30 Units into the skin daily before breakfast.   Januvia 100 MG tablet Generic drug: sitaGLIPtin Take 100 mg by mouth daily.   lisinopril 10 MG tablet Commonly known as: ZESTRIL Take 10 mg by mouth daily as needed (for hypertension).   magnesium oxide 400 MG tablet Commonly known as: MAG-OX Take 800 mg by mouth daily.   metFORMIN 1000 MG tablet Commonly known as: GLUCOPHAGE Take 1,000 mg by mouth daily with breakfast.   nicotine 14 mg/24hr patch Commonly known as: NICODERM CQ - dosed in mg/24 hours Place 1 patch (14 mg total) onto the skin daily as needed (Nicotine withdrawal symptoms.).   ondansetron 8 MG disintegrating tablet Commonly known as: ZOFRAN-ODT Take 8 mg by mouth every 8 (eight) hours as needed for nausea.   oxyCODONE 5 MG immediate release tablet Commonly known as: Oxy IR/ROXICODONE Take 1 tablet (5 mg total) by mouth every 4 (four) hours as needed for moderate pain.   risperiDONE 2 MG tablet Commonly known as: RISPERDAL Take 2 mg by mouth at bedtime.   sertraline 100 MG tablet Commonly known as: ZOLOFT Take 100 mg by mouth daily.   traZODone 100 MG tablet Commonly known as: DESYREL Take 100 mg by mouth at bedtime as needed for sleep.       Allergies  Allergen Reactions   Atorvastatin Other (See Comments)    Leg cramps and body aches    Follow-up Information     Raymondo BandVu, Trung T, MD Follow up on 09/17/2022.   Specialty: Infectious Diseases Why: f/u as scheduled at 330pm Contact information: [address] [place]  [id number] [phone]         Courtney HeysYates-Davis, Heather D, PA-C. Schedule an appointment as soon as possible for a visit in 2 week(s).   Specialties: Family Medicine, Physician Assistant Contact information: [address] [place] [id number] [phone]         Bradly Bienenstockrtmann, Fred, MD Follow up.   Specialty: Orthopedic Surgery Why: As needed Contact information: [address] [place] [id number] [phone]                  The results of significant diagnostics from this hospitalization (including imaging, microbiology, ancillary and laboratory) are listed below for reference.    Significant Diagnostic Studies: MR WRIST RIGHT WO CONTRAST  Result Date: 08/24/2022 CLINICAL DATA:  Radial sided right wrist pain. Swelling. Open sores on wrist. EXAM: MR OF THE RIGHT WRIST WITHOUT CONTRAST TECHNIQUE: Multiplanar, multisequence MR imaging of the  right wrist was performed. No intravenous contrast was administered. COMPARISON:  Hand x-ray 08/24/2022 FINDINGS: Ligaments: Scapholunate ligament appears degenerated but intact. Intact lunotriquetral ligament. Triangular fibrocartilage: Grossly intact TFCC on non-arthrographic images. Tendons: Mild tenosynovitis of the first, second, and third extensor compartments. Tendons intact without tear. Carpal tunnel/median nerve: Normal carpal tunnel. Normal median nerve. Guyon's canal: Normal. Joint/cartilage: Moderate osteoarthritis, most pronounced at the first [facility] joint with high-grade cartilage loss and bulky marginal osteophytes. No focal erosion. Physiologic amount of joint fluid without significant joint effusion. Bones/carpal alignment: No acute fracture. No dislocation. Scattered areas of degenerative subchondral marrow edema. Normal alignment. No suspicious osseous lesion. Other: Generalized subcutaneous edema. Slightly more focal site of fluid within the superficial soft tissues dorsal to the second [facility] joint measuring  9 x 2 x 5 mm (series 4, image 9). No additional focal fluid collections are evident. IMPRESSION: 1. Generalized subcutaneous edema. Slightly more focal site of fluid within the superficial soft tissues dorsal to the second [facility] joint measuring up to 9 mm, which may represent a small abscess. 2. Mild tenosynovitis of the first, second, and third extensor compartments. 3. 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 Electronically Signed   By: Duanne Guess D.O.   On: 08/24/2022 15:14   DG Hand Complete Right  Result Date: 08/24/2022 CLINICAL DATA:  Right hand pain and swelling EXAM: RIGHT HAND - COMPLETE 3+ VIEW COMPARISON:  None Available. FINDINGS: No acute fracture or dislocation. Healed right fifth metacarpal fracture with minimal residual volar deformity. Moderate degenerative arthritis at at the base of the thumb at the first carpometacarpal joint. Remaining joint spaces appear preserved. Mild diffuse soft tissue swelling of the right hand and visualized right breast. IMPRESSION: Diffuse soft tissue swelling. No acute fracture or dislocation. Electronically Signed   By: Helyn Numbers M.D.   On: 08/24/2022 01:39    Microbiology: Recent Results (from the past 240 hour(s))  Blood culture (routine x 2)     Status: None (Preliminary result)   Collection Time: 08/24/22  3:58 AM   Specimen: BLOOD  Result Value Ref Range Status   Specimen Description   Final    BLOOD LEFT ANTECUBITAL Performed at [facility], [address] [place], [place] [address]    Special Requests   Final    BOTTLES DRAWN AEROBIC AND ANAEROBIC Blood Culture adequate volume Performed at [facility], [address] [place], [place] [address]    Culture   Final    NO GROWTH 3 DAYS Performed at [facility], [address], [place], [place] [address]    Report Status PENDING  Incomplete  MRSA Next Gen by PCR, Nasal     Status: Abnormal   Collection Time: 08/24/22  6:50  PM   Specimen: Nasal Mucosa; Nasal Swab  Result Value Ref Range Status   MRSA by PCR Next Gen DETECTED (A) NOT DETECTED Final    Comment: RESULT CALLED TO, READ BACK BY AND VERIFIED WITH: Yetta Barre, R @ [id number] JMK (NOTE) The GeneXpert MRSA Assay (FDA approved for NASAL specimens only), is one component of a comprehensive MRSA colonization surveillance program. It is not intended to diagnose MRSA infection nor to guide or monitor treatment for MRSA infections. Test performance is not FDA approved in patients less than 74 years old. Performed at [facility], [address] [place], [place] [address]      Labs: Basic Metabolic Panel: Recent Labs  Lab 08/24/22 0138 08/25/22 0344  08/26/22 0328 08/27/22 0548  NA 136 137 137 136  K 4.4 4.1 4.0 3.9  CL 99 103 103 103  CO2 30 28 26 24   GLUCOSE 132* 161* 102* 200*  BUN 18 17 12 13   CREATININE 0.99 0.81 0.91 0.91  CALCIUM 9.6 8.9 8.9 8.5*  MG  --  1.7 1.8 1.8   Liver Function Tests: Recent Labs  Lab 08/24/22 1120 08/25/22 0344 08/26/22 0328 08/27/22 0548  AST 29 26 25  45*  ALT 27 25 22  33  ALKPHOS 84 86 81 98  BILITOT 0.5 0.3 0.4 0.3  PROT 6.8 7.0 6.9 6.4*  ALBUMIN 3.5 3.5 3.4* 3.2*   No results for input(s): "LIPASE", "AMYLASE" in the last 168 hours. No results for input(s): "AMMONIA" in the last 168 hours. CBC: Recent Labs  Lab 08/24/22 0138 08/25/22 0344 08/26/22 0328 08/27/22 0548  WBC 1.9* 1.6* 1.7* 1.3*  NEUTROABS 1.2*  --  1.3* 0.8*  HGB 10.1* 9.6* 9.5* 9.2*  HCT 30.9* 30.2* 30.1* 28.5*  MCV 90.9 93.5 93.8 90.8  PLT 34* 39* 35* 28*   Cardiac Enzymes: Recent Labs  Lab 08/25/22 0344  CKTOTAL 91   BNP: BNP (last 3 results) No results for input(s): "BNP" in the last 8760 hours.  ProBNP (last 3 results) No results for input(s): "PROBNP" in the last 8760 hours.  CBG: Recent Labs  Lab 08/26/22 1149 08/26/22 1719 08/26/22 2310 08/27/22 0734 08/27/22 1202  GLUCAP 163* 157*  149* 230* 147*       Signed:  Irine Seal MD.  [facility] 08/27/2022, 3:13 PM

## 2022-08-27 NOTE — TOC Transition Note (Signed)
Transition of Care [facility]) - CM/SW Discharge Note   Patient Details  Name: GOERGE MOHR MRN: [id number] Date of Birth: [birth year]  [facility] CM/SW Contact:  Lennart Pall, LCSW Phone Number: 08/27/2022, 2:28 PM   Clinical Narrative:    Met with pt today to review for potential dc needs.  Pt pleasant and is anticipating dc today? He confirms he has an established PCP and access to transportation.  No problems getting medications.  Regarding SA concerns, pt states that he plans to connect with suboxone clinic once he is discharged.  I have placed SA resources on AVS anyway.  No further TOC needs.  Please reorder if new needs arise.   Final next level of care: Home/Self Care Barriers to Discharge: Barriers Resolved   Patient Goals and CMS Choice Patient states their goals for this hospitalization and ongoing recovery are:: return home      Discharge Placement                       Discharge Plan and Services                DME Arranged: N/A DME Agency: NA                  Social Determinants of Health (SDOH) Interventions Food Insecurity Interventions: Intervention Not Indicated Housing Interventions: Intervention Not Indicated Transportation Interventions: Intervention Not Indicated Utilities Interventions: Intervention Not Indicated   Readmission Risk Interventions    08/27/2022    2:27 PM  Readmission Risk Prevention Plan  Transportation Screening Complete  PCP or Specialist Appt within 5-7 Days Complete  Home Care Screening Complete  Medication Review (RN CM) Complete

## 2022-08-27 NOTE — Progress Notes (Signed)
[facility]  Date of Admission:  08/24/2022     Abx: 10/22-c ceftriaxone 10/22-c daptomycin                                                       Assessment: 56 yo male with active ivdu, liver cirrhosis due to hx hep c (treated with epclusa by 02/2021), dm2, admitted with a several days right hand pain found on mri to have deep soft tissue infection   Mri showed several digit extensors tenosynovitis and a small abscess dorsum to the mcp joint. Hand surgery following and planned I&D   10/22 admission bcx ngtd 10/22 mrsa  nares screen positive     Suspect skin popping/ivdu causing contiguous infection  -------- 10/25 assessment Continues to do well/improved No surgery needed per hand ortho team Abscess on ct juxtapose immediately to the [facility] joint but full rom on the wrist suggest noninvolvement of the joint although difficult to say  Will plan 4 weeks oral abx and reevaluate in clinic       Plan: Change iv abx to po abx with doxy 100 mg po bid and cefadroxil 1 gram po bid; plan 4 weeks until 11/22 He has follow up appointment with me on 11/15 @ 330 Discussed with primary team   ID Clinic Follow Up Appt  @  [facility] [id number], [place], [place] [address] Phone: [phone]      Principal Problem:   Tenosynovitis of right hand Active Problems:   Pancytopenia ([place])   Chronic hepatitis C with cirrhosis ([place])   Essential hypertension   Type 2 diabetes mellitus with diabetic polyneuropathy, with long-term current use of insulin (HCC)   MDD (major depressive disorder)   Intravenous drug abuse ([place])   Abscess of finger of right hand   Allergies  Allergen Reactions   Atorvastatin Other (See Comments)    Leg cramps and body aches    Scheduled Meds:  cefadroxil  1,000 mg Oral BID   Chlorhexidine Gluconate Cloth  6 each Topical Q0600   doxycycline  100 mg Oral Q12H   DULoxetine  60 mg Oral Daily   gabapentin   600 mg Oral TID   insulin aspart  0-15 Units Subcutaneous TID WC   insulin glargine-yfgn  25 Units Subcutaneous Q breakfast   magnesium oxide  800 mg Oral Daily   metFORMIN  1,000 mg Oral Q breakfast   mupirocin ointment  1 Application Nasal BID   risperiDONE  2 mg Oral QHS   sertraline  100 mg Oral Daily   Continuous Infusions:   PRN Meds:.acetaminophen **OR** acetaminophen, lisinopril, morphine injection **OR** oxyCODONE, nicotine, ondansetron **OR** ondansetron (ZOFRAN) IV, traZODone   SUBJECTIVE: No complaint  Asked if he can go home  No f/c Tolerating abx  Review of Systems: ROS All other ROS was negative, except mentioned above     OBJECTIVE: Vitals:   08/26/22 1317 08/26/22 2307 08/26/22 2312 08/27/22 0534  BP: (!) 149/91 (!) 157/101 (!) 140/95 126/84  Pulse: 67 74 81 62  Resp: 18 18 18 19   Temp: 98.3 F (36.8 C)  97.6 F (36.4 C) 98.6 F (37 C)  TempSrc: Oral   Oral  SpO2: 95% 100% 100%   Weight:  Height:       Body mass index is 24.41 kg/m.  Physical Exam General/constitutional: no distress, pleasant HEENT: Normocephalic, PER, Conj Clear, EOMI, Oropharynx clear Neck supple CV: rrr no mrg Lungs: clear to auscultation, normal respiratory effort Abd: Soft, Nontender Ext: no edema Skin: No Rash MSK: right wrist full rom minimal tenderness; the nodule on the dorsum wrist remains but appears smaller and the skin had closed; nontender today   Lab Results Lab Results  Component Value Date   WBC 1.3 (LL) 08/27/2022   HGB 9.2 (L) 08/27/2022   HCT 28.5 (L) 08/27/2022   MCV 90.8 08/27/2022   PLT 28 (LL) 08/27/2022    Lab Results  Component Value Date   CREATININE 0.91 08/27/2022   BUN 13 08/27/2022   NA 136 08/27/2022   K 3.9 08/27/2022   CL 103 08/27/2022   CO2 24 08/27/2022    Lab Results  Component Value Date   ALT 33 08/27/2022   AST 45 (H) 08/27/2022   ALKPHOS 98 08/27/2022   BILITOT 0.3 08/27/2022      Microbiology: Recent  Results (from the past 240 hour(s))  Blood culture (routine x 2)     Status: None (Preliminary result)   Collection Time: 08/24/22  3:58 AM   Specimen: BLOOD  Result Value Ref Range Status   Specimen Description   Final    BLOOD LEFT ANTECUBITAL Performed at [facility], [address] [place], [place] [address]    Special Requests   Final    BOTTLES DRAWN AEROBIC AND ANAEROBIC Blood Culture adequate volume Performed at [facility], [address] [place], [place] [address]    Culture   Final    NO GROWTH 3 DAYS Performed at [facility], [place] [address], [place], [place] [address]    Report Status PENDING  Incomplete  MRSA Next Gen by PCR, Nasal     Status: Abnormal   Collection Time: 08/24/22  6:50 PM   Specimen: Nasal Mucosa; Nasal Swab  Result Value Ref Range Status   MRSA by PCR Next Gen DETECTED (A) NOT DETECTED Final    Comment: RESULT CALLED TO, READ BACK BY AND VERIFIED WITH: Ronnald Ramp, R @ [phone] JMK (NOTE) The GeneXpert MRSA Assay (FDA approved for NASAL specimens only), is one component of a comprehensive MRSA colonization surveillance program. It is not intended to diagnose MRSA infection nor to guide or monitor treatment for MRSA infections. Test performance is not FDA approved in patients less than 14 years old. Performed at [facility], [place] [address], [place], [place] [address]      Serology:   Imaging: If present, new imagings (plain films, ct scans, and mri) have been personally visualized and interpreted; radiology reports have been reviewed. Decision making incorporated into the Impression / Recommendations.  10/22 mri right hand 1. Generalized subcutaneous edema. Slightly more focal site of fluid within the superficial soft tissues dorsal to the second [facility] joint measuring up to 9 mm, which may represent a small abscess. 2. Mild tenosynovitis of the first, second, and third  extensor compartments. 3. 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   Jabier Mutton, [facility] [place] [phone] pager    08/27/2022, 10:54 AM

## 2022-08-27 NOTE — Progress Notes (Signed)
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  Upon evaluation in the emergency department EDP was concerned for septic arthritis and case was discussed with Dr. Orlan Leavens with orthopedic hand.  Tentative plan is for patient to go to the operating room for intervention on 10/23.  Hospitalist group was then called to assess the patient for mission of the hospital.  On 10/23 patient's hand appeared to be improving and therefore operative intervention was canceled.  Patient continuing to receive intravenous antibiotic therapy with daptomycin and ceftriaxone at this time.   Assessment and Plan: * Tenosynovitis of right hand Continued clinical improvement Continuing empiric intravenous antibiotics for now Blood cultures remain negative Per infectious disease, 24 more hours of ceftriaxone and daptomycin followed by switching to oral antibiotic therapy with doxycycline and cefadroxil for total of 4 weeks of antibiotic therapy.  Abscess of finger of right hand Please see assessment and plan above  Intravenous drug abuse (HCC) Ongoing intravenous opiate drug abuse makes managing patient's pain with analgesics a challenge Patient has very legitimate severe pain of the right hand We are limiting opiate-based analgesics to what is already ordered Counseling patient on cessation daily  Chronic hepatitis C with cirrhosis (HCC) Longstanding history of chronic hepatitis C with cirrhosis complicated by  pancytopenia Patient has been evaluated as an outpatient with Dr. Leonides Schanz with hematology concerning this in the past.  It is felt the patient's substantial pancytopenia secondary to his patient's significant splenomegaly No clinical evidence of bleeding  Essential hypertension Resume patients home regimen of oral antihypertensives Titrate antihypertensive regimen as necessary to achieve adequate BP control PRN intravenous antihypertensives for excessively elevated blood pressure    Pancytopenia (HCC) Patient has been evaluated for this in the past by Dr. Leonides Schanz in the outpatient setting Pan cytopenia felt to be secondary to patient's cirrhosis and severe splenomegaly Supportive care  Type 2 diabetes mellitus with diabetic polyneuropathy, with long-term current use of insulin (HCC) Resuming patient's home regimen of long-acting insulin therapy Accu-Cheks before every meal and nightly with sliding scale insulin Hemoglobin A1c found to be 5.3%. Diabetic diet  MDD (major depressive disorder) Home regimen of psychotropic agents       Subjective:  Patient's pain in the right hand is moderate in intensity, sharp in quality, radiating proximally and improved compared to yesterday.  Physical Exam:  Vitals:   08/26/22 0525 08/26/22 1317 08/26/22 2307 08/26/22 2312  BP: 129/73 (!) 149/91 (!) 157/101 (!) 140/95  Pulse: 66 67 74 81  Resp: 17 18 18 18   Temp: 98.5 F (36.9 C) 98.3 F (36.8 C)  97.6 F (36.4 C)  TempSrc: Oral Oral    SpO2: 97% 95% 100% 100%  Weight:      Height:        Constitutional: Awake alert and oriented x3, no associated distress.   Skin: Multiple areas of excoriations and what appears to be track marks of all extremities.  Area of the dorsum of the right hand and second digit seems to be less erythematous indurated and warm compared to yesterday.   Eyes: Pupils are  equally reactive to light.  No evidence of scleral icterus or conjunctival pallor.  ENMT:  Moist mucous membranes noted.  Posterior pharynx clear of any exudate or lesions.   Respiratory: clear to auscultation bilaterally, no wheezing, no crackles. Normal respiratory effort. No accessory muscle use.  Cardiovascular: Regular rate and rhythm, no murmurs / rubs / gallops. No extremity edema. 2+ pedal pulses. No carotid bruits.  Abdomen: Abdomen is soft and nontender.  No evidence of intra-abdominal masses.  Positive bowel sounds noted in all quadrants.   Musculoskeletal: No joint deformity upper and lower extremities. Good ROM, no contractures. Normal muscle tone.    Data Reviewed:  I have personally reviewed and interpreted labs, imaging.  Significant findings are   CBC: Recent Labs  Lab 08/24/22 0138 08/25/22 0344 08/26/22 0328  WBC 1.9* 1.6* 1.7*  NEUTROABS 1.2*  --  1.3*  HGB 10.1* 9.6* 9.5*  HCT 30.9* 30.2* 30.1*  MCV 90.9 93.5 93.8  PLT 34* 39* 35*   Basic Metabolic Panel: Recent Labs  Lab 08/24/22 0138 08/25/22 0344 08/26/22 0328  NA 136 137 137  K 4.4 4.1 4.0  CL 99 103 103  CO2 30 28 26   GLUCOSE 132* 161* 102*  BUN 18 17 12   CREATININE 0.99 0.81 0.91  CALCIUM 9.6 8.9 8.9  MG  --  1.7 1.8   GFR: Estimated Creatinine Clearance: 103 mL/min (by C-G formula based on SCr of 0.91 mg/dL). Liver Function Tests: Recent Labs  Lab 08/24/22 1120 08/25/22 0344 08/26/22 0328  AST 29 26 25   ALT 27 25 22   ALKPHOS 84 86 81  BILITOT 0.5 0.3 0.4  PROT 6.8 7.0 6.9  ALBUMIN 3.5 3.5 3.4*   Code Status:  Full code.   Severity of Illness:  The appropriate patient status for this patient is INPATIENT. Inpatient status is judged to be reasonable and necessary in order to provide the required intensity of service to ensure the patient's safety. The patient's presenting symptoms, physical exam findings, and initial radiographic and laboratory data in the context of their chronic comorbidities is felt to place them at high risk for further clinical deterioration.  Furthermore, it is not anticipated that the patient will be medically stable for discharge from the hospital within 2 midnights of admission.   * I certify that at the point of admission it is my clinical judgment that the patient will require inpatient hospital care spanning beyond 2 midnights from the point of admission due to high intensity of service, high risk for further deterioration and high frequency of surveillance required.*  Time spent:  35 minutes  Author:  Vernelle Emerald MD  08/27/2022 12:04 AM

## 2022-08-27 NOTE — Progress Notes (Signed)
Critical labs paged to Dr Grandville Silos, WBC 1.3 and platelets 28.

## 2022-08-28 LAB — CBC WITH DIFFERENTIAL/PLATELET
Abs Immature Granulocytes: 0.01 10*3/uL (ref 0.00–0.07)
Basophils Absolute: 0 10*3/uL (ref 0.0–0.1)
Basophils Relative: 1 %
Eosinophils Absolute: 0.1 10*3/uL (ref 0.0–0.5)
Eosinophils Relative: 5 %
HCT: 28.5 % — ABNORMAL LOW (ref 39.0–52.0)
Hemoglobin: 9.2 g/dL — ABNORMAL LOW (ref 13.0–17.0)
Immature Granulocytes: 1 %
Lymphocytes Relative: 18 %
Lymphs Abs: 0.2 10*3/uL — ABNORMAL LOW (ref 0.7–4.0)
MCH: 29.3 pg (ref 26.0–34.0)
MCHC: 32.3 g/dL (ref 30.0–36.0)
MCV: 90.8 fL (ref 80.0–100.0)
Monocytes Absolute: 0.2 10*3/uL (ref 0.1–1.0)
Monocytes Relative: 16 %
Neutro Abs: 0.8 10*3/uL — ABNORMAL LOW (ref 1.7–7.7)
Neutrophils Relative %: 59 %
Platelets: 28 10*3/uL — CL (ref 150–400)
RBC: 3.14 MIL/uL — ABNORMAL LOW (ref 4.22–5.81)
RDW: 13.5 % (ref 11.5–15.5)
WBC: 1.3 10*3/uL — CL (ref 4.0–10.5)
nRBC: 0 % (ref 0.0–0.2)

## 2022-08-29 LAB — CULTURE, BLOOD (ROUTINE X 2)
Culture: NO GROWTH
Special Requests: ADEQUATE

## 2022-09-17 ENCOUNTER — Telehealth: Payer: Self-pay

## 2022-09-17 ENCOUNTER — Inpatient Hospital Stay: Payer: Medicaid Other | Admitting: Internal Medicine

## 2022-09-17 NOTE — Telephone Encounter (Signed)
Called patient to reschedule today's missed appointment, no answer and unable to leave voicemail.  Sandie Ano, RN

## 2022-10-01 ENCOUNTER — Encounter (HOSPITAL_BASED_OUTPATIENT_CLINIC_OR_DEPARTMENT_OTHER): Payer: Self-pay | Admitting: Nurse Practitioner

## 2022-10-01 ENCOUNTER — Ambulatory Visit (INDEPENDENT_AMBULATORY_CARE_PROVIDER_SITE_OTHER): Payer: Medicaid Other | Admitting: Nurse Practitioner

## 2022-10-01 VITALS — BP 90/56 | HR 80 | Ht 72.0 in | Wt 176.0 lb

## 2022-10-01 DIAGNOSIS — R002 Palpitations: Secondary | ICD-10-CM

## 2022-10-01 DIAGNOSIS — Z8679 Personal history of other diseases of the circulatory system: Secondary | ICD-10-CM | POA: Diagnosis not present

## 2022-10-01 DIAGNOSIS — R55 Syncope and collapse: Secondary | ICD-10-CM | POA: Diagnosis not present

## 2022-10-01 DIAGNOSIS — R011 Cardiac murmur, unspecified: Secondary | ICD-10-CM | POA: Diagnosis not present

## 2022-10-01 DIAGNOSIS — R252 Cramp and spasm: Secondary | ICD-10-CM

## 2022-10-01 MED ORDER — POTASSIUM CHLORIDE ER 10 MEQ PO TBCR: 10.0000 meq | EXTENDED_RELEASE_TABLET | ORAL | 3 refills | Status: DC | PRN

## 2022-10-01 NOTE — Patient Instructions (Signed)
Medication Instructions:   START Kdur one (1) tablet by mouth (10 mEq) as needed for severe leg cramps.   *If you need a refill on your cardiac medications before your next appointment, please call your pharmacy*   Lab Work:  None ordered.  If you have labs (blood work) drawn today and your tests are completely normal, you will receive your results only by: MyChart Message (if you have MyChart) OR A paper copy in the mail If you have any lab test that is abnormal or we need to change your treatment, we will call you to review the results.   Testing/Procedures:  Your physician has requested that you have an echocardiogram. Echocardiography is a painless test that uses sound waves to create images of your heart. It provides your doctor with information about the size and shape of your heart and how well your heart's chambers and valves are working. This procedure takes approximately one hour. There are no restrictions for this procedure. Please do NOT wear cologne, perfume, aftershave, or lotions (deodorant is allowed). Please arrive 15 minutes prior to your appointment time.    Follow-Up: At [facility], you and your health needs are our priority.  As part of our continuing mission to provide you with exceptional heart care, we have created designated Provider Care Teams.  These Care Teams include your primary Cardiologist (physician) and Advanced Practice Providers (APPs -  Physician Assistants and Nurse Practitioners) who all work together to provide you with the care you need, when you need it.  We recommend signing up for the patient portal called "MyChart".  Sign up information is provided on this After Visit Summary.  MyChart is used to connect with patients for Virtual Visits (Telemedicine).  Patients are able to view lab/test results, encounter notes, upcoming appointments, etc.  Non-urgent messages can be sent to your provider as well.   To learn more about what you can  do with MyChart, go to [email or web address].    Your next appointment:   6 month(s)  The format for your next appointment:   In Person  Provider:   Jodelle Red, MD    Important Information About Sugar
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
## 2022-10-31 ENCOUNTER — Other Ambulatory Visit (HOSPITAL_BASED_OUTPATIENT_CLINIC_OR_DEPARTMENT_OTHER): Payer: Medicaid Other

## 2022-11-04 ENCOUNTER — Telehealth (HOSPITAL_BASED_OUTPATIENT_CLINIC_OR_DEPARTMENT_OTHER): Payer: Self-pay | Admitting: Nurse Practitioner

## 2022-11-04 NOTE — Telephone Encounter (Signed)
Left message for patient to call and dsicuss rescheduling the 10/31/22 Echocardiogram appointment that was missed

## 2022-11-07 ENCOUNTER — Encounter (HOSPITAL_BASED_OUTPATIENT_CLINIC_OR_DEPARTMENT_OTHER): Payer: Self-pay | Admitting: Nurse Practitioner

## 2022-11-07 ENCOUNTER — Other Ambulatory Visit (HOSPITAL_COMMUNITY): Payer: Self-pay

## 2022-11-07 NOTE — Telephone Encounter (Signed)
Unable to reach patient via phone to discuss rescheduling the 11/04/22 echocardiogram appointment----will mail letter to patient requesting he contact the office

## 2022-11-09 ENCOUNTER — Other Ambulatory Visit: Payer: Self-pay

## 2022-11-09 ENCOUNTER — Encounter (HOSPITAL_BASED_OUTPATIENT_CLINIC_OR_DEPARTMENT_OTHER): Payer: Self-pay

## 2022-11-09 ENCOUNTER — Emergency Department (HOSPITAL_BASED_OUTPATIENT_CLINIC_OR_DEPARTMENT_OTHER): Payer: Medicaid Other

## 2022-11-09 ENCOUNTER — Emergency Department (HOSPITAL_BASED_OUTPATIENT_CLINIC_OR_DEPARTMENT_OTHER)
Admission: EM | Admit: 2022-11-09 | Discharge: 2022-11-10 | Disposition: A | Discharge status: Home / Self Care | Payer: Medicaid Other | Attending: Emergency Medicine | Admitting: Emergency Medicine

## 2022-11-09 DIAGNOSIS — Z7984 Long term (current) use of oral hypoglycemic drugs: Secondary | ICD-10-CM | POA: Insufficient documentation

## 2022-11-09 DIAGNOSIS — Z794 Long term (current) use of insulin: Secondary | ICD-10-CM | POA: Diagnosis not present

## 2022-11-09 DIAGNOSIS — L03012 Cellulitis of left finger: Secondary | ICD-10-CM | POA: Insufficient documentation

## 2022-11-09 DIAGNOSIS — M79645 Pain in left finger(s): Secondary | ICD-10-CM | POA: Diagnosis present

## 2022-11-09 MED ORDER — CEPHALEXIN 250 MG PO CAPS
500.0000 mg | ORAL_CAPSULE | Freq: Once | ORAL | Status: AC
  Administered 2022-11-09: 500 mg via ORAL
  Filled 2022-11-09: qty 2

## 2022-11-09 MED ORDER — CEPHALEXIN 500 MG PO CAPS: 500.0000 mg | ORAL_CAPSULE | Freq: Four times a day (QID) | ORAL | 0 refills | Status: DC

## 2022-11-09 NOTE — Discharge Instructions (Addendum)
You were seen in the emergency department for cellulitis of the left hand. Given previous need for inpatient admission for possible septic arthritis, please monitor the site through the course of antibiotics for resolution. If the redness worsens, you begin to experience a fever, or have a significant loss of movement in the finger, please return for further evaluation. You should take the antibiotics as prescribed over the next 5 days in their entirety to ensure eradication of the infection.

## 2022-11-09 NOTE — ED Provider Notes (Signed)
 [facility] EMERGENCY DEPT Provider Note   CSN: [id number] Arrival date & time: 11/09/22  1733     History Chief Complaint  Patient presents with   Hand Injury    Chase Reed is a 57 y.o. male.   Hand Injury Associated symptoms: no fatigue and no fever   Patient presents emergency department with complaints of left hand injury.  Patient reports that he closed the car door on his left hand and finger.  He denies that this initial injury resulted in any open wound or active bleeding.  He reported to me that he has a chronic history of low platelets which make bruising and swelling very common for him.  He reports he is having some limited movement in his left thumb due to pain.  He also reports that he has another similar site in his left wrist which is a little firm and painful.  Patient has a history of IV drug use.     Home Medications Prior to Admission medications   Medication Sig Start Date End Date Taking? Authorizing Provider  cephALEXin (KEFLEX) 500 MG capsule Take 1 capsule (500 mg total) by mouth 4 (four) times daily. 11/09/22  Yes Smitty Knudsen, PA-C  DULoxetine (CYMBALTA) 60 MG capsule Take 1 capsule (60 mg total) by mouth daily. 10/22/21   Shanna Cisco, NP  gabapentin (NEURONTIN) 600 MG tablet Take 600 mg by mouth in the morning, at noon, in the evening, and at bedtime.  01/20/19   [provider]  insulin aspart (NOVOLOG) 100 UNIT/ML injection Inject 2-8 Units into the skin See admin instructions. Take 2-8 units as needed  Sliding scale.over 250--2 units over 300--4 units over 350--6 units over 400--8 units Up to 50 units daily: For diabetes management Patient taking differently: Inject 2-8 Units into the skin See admin instructions. Inject 2-8 units into the skin one to four times a day as needed for elevated BGL, PER SLIDING SCALE- BGL over 250 = 2 units; over 300 = 4 units; over 350 = 6 units; over 400 = 8 units (Up to 50 units daily:  For diabetes management) 10/20/21   Glade Lloyd, MD  insulin glargine (LANTUS) 100 UNIT/ML injection Inject 25-30 Units into the skin daily before breakfast.    [provider]  JANUVIA 100 MG tablet Take 100 mg by mouth daily. 05/07/21   [provider]  lisinopril (ZESTRIL) 10 MG tablet Take 10 mg by mouth daily as needed (for hypertension). 06/30/22   [provider]  magnesium oxide (MAG-OX) 400 MG tablet Take 800 mg by mouth daily. 06/30/22   [provider]  metFORMIN (GLUCOPHAGE) 1000 MG tablet Take 1,000 mg by mouth daily with breakfast.    [provider]  nicotine (NICODERM CQ - DOSED IN MG/24 HOURS) 14 mg/24hr patch Place 1 patch (14 mg total) onto the skin daily as needed (Nicotine withdrawal symptoms.). Patient not taking: Reported on 10/01/2022 08/27/22   Rodolph Bong, MD  ondansetron (ZOFRAN-ODT) 8 MG disintegrating tablet Take 8 mg by mouth every 8 (eight) hours as needed for nausea. 01/13/22   [provider]  oxyCODONE (OXY IR/ROXICODONE) 5 MG immediate release tablet Take 1 tablet (5 mg total) by mouth every 4 (four) hours as needed for moderate pain. 08/27/22   Rodolph Bong, MD  potassium chloride (KLOR-CON) 10 MEQ tablet Take 1 tablet (10 mEq total) by mouth as needed. For severe leg cramps. 10/01/22 12/30/22  Swinyer, Zachary George, NP  risperiDONE (RISPERDAL) 2 MG tablet Take 2 mg by mouth at bedtime. 06/02/22   [provider]  sertraline (ZOLOFT) 100 MG tablet Take 100 mg by mouth daily. 03/11/21   [provider]  traZODone (DESYREL) 100 MG tablet Take 100 mg by mouth at bedtime as needed for sleep.    [provider]      Allergies    Atorvastatin    Review of Systems   Review of Systems  Constitutional:  Negative for chills, fatigue and fever.  Respiratory:  Negative for cough, chest tightness and shortness of breath.   Musculoskeletal:  Positive for joint swelling.  Skin:  Positive  for color change. Negative for rash and wound.  Hematological:  Does not bruise/bleed easily.  All other systems reviewed and are negative.   Physical Exam Updated Vital Signs BP 119/70 (BP Location: Right Arm)   Pulse (!) 50   Temp (!) 97.5 F (36.4 C)   Resp 18   Ht 6' (1.829 m)   Wt 79.4 kg   SpO2 97%   BMI 23.73 kg/m  Physical Exam Vitals and nursing note reviewed.  Constitutional:      Appearance: Normal appearance.     Comments: Patient appears well without obvious discomfort. Vitals are stable and all within normal range and he remains afebrile, and normotensive during entirety of encounter.  HENT:     Head: Normocephalic and atraumatic.     Nose: Nose normal.  Eyes:     Conjunctiva/sclera: Conjunctivae normal.  Cardiovascular:     Rate and Rhythm: Normal rate and regular rhythm.     Pulses: Normal pulses.     Heart sounds: Normal heart sounds.  Pulmonary:     Effort: Pulmonary effort is normal.     Breath sounds: Normal breath sounds.  Skin:    General: Skin is warm and dry.     Findings: Erythema present.     Comments: Patient has a small area of erythema overlying the left thumb MCP and left wrist on radial side where he hit himself with car door. No obvious fluctuance or drainage noted.  Erythema on left wrist and thumb is non-streaking and without obvious route of infection such as needle stick of skin laceration in the skin.  Neurological:     Mental Status: He is alert.     ED Results / Procedures / Treatments   Labs (all labs ordered are listed, but only abnormal results are displayed) Labs Reviewed - No data to display  EKG None  Radiology DG Finger Thumb Left  Result Date: 11/09/2022 CLINICAL DATA:  Injury. Left thumb pain. Per EMS a door slammed on his hand and patient states he has not infection. Thumb is red warm and painful to touch. EXAM: LEFT WRIST - COMPLETE 3+ VIEW; LEFT THUMB 2+V COMPARISON:  None Available. FINDINGS: There is no  evidence of fracture or dislocation. There is no evidence of arthropathy or other focal bone abnormality. Soft tissue swelling about the thumb. IMPRESSION: 1. No acute fracture or dislocation. 2. Soft tissue swelling about the thumb. No radiopaque foreign body. Electronically Signed   By: Larose Hires D.O.   On: 11/09/2022 18:55   DG Wrist Complete Left  Result Date: 11/09/2022 CLINICAL DATA:  Injury. Left thumb pain. Per EMS a door slammed on his hand and patient states he has not infection. Thumb is red warm and painful to touch. EXAM: LEFT WRIST - COMPLETE 3+ VIEW; LEFT THUMB 2+V COMPARISON:  None  Available. FINDINGS: There is no evidence of fracture or dislocation. There is no evidence of arthropathy or other focal bone abnormality. Soft tissue swelling about the thumb. IMPRESSION: 1. No acute fracture or dislocation. 2. Soft tissue swelling about the thumb. No radiopaque foreign body. Electronically Signed   By: Larose Hires D.O.   On: 11/09/2022 18:55    Procedures Procedures   Medications Ordered in ED Medications  cephALEXin (KEFLEX) capsule 500 mg (has no administration in time range)    ED Course/ Medical Decision Making/ A&P                           Medical Decision Making Amount and/or Complexity of Data Reviewed Radiology: ordered.   This patient presents to the ED for concern of swelling in left hand. Differential diagnosis includes cellulitis, septic arthritis, gout, pseudogout, fracture of finger    Additional history obtained:  Additional history obtained from inpatient admission for suspected pyogenic arthritis on 08/24/2022   Imaging Studies ordered:  I ordered imaging studies including xray of left hand and left finger  I independently visualized and interpreted imaging which showed negative for any fractures, dislocations, or evidence of gas under skin I agree with the radiologist interpretation   Medicines ordered and prescription drug management:  I  ordered medication including keflex  for cellulitis  I have reviewed the patients home medicines and have made adjustments as needed   Problem List / ED Course:  Patient presents to the emergency department complaints of left hand pain.  He reports that he had injured his hand with a car door.  Patient did also report to me that he is a current IV drug user with last use earlier today.  Patient was overall reassuring on physical exam with movement still seen on left thumb no obvious fluctuance noted on palpation.  Patient remained normotensive and afebrile during entirety of encounter so believe patient is safe to discharge home with oral antibiotics to treat this as cellulitis. Discussed all return precautions with patient such as sudden onset of fever, worsening redness, or loss of movement in left thumb as indications to return to the ED for further evaluation. Patient was agreeable with plan to discharge home at this time.    Final Clinical Impression(s) / ED Diagnoses Final diagnoses:  Cellulitis of finger of left hand    Rx / DC Orders ED Discharge Orders          Ordered    cephALEXin (KEFLEX) 500 MG capsule  4 times daily        11/09/22 2338              Smitty Knudsen, PA-C 11/09/22 2354    Arby Barrette, MD 11/12/22 [phone]

## 2022-11-09 NOTE — ED Triage Notes (Signed)
Patient arrives to ED POV c/o Lf thumb pain. Per EMS a door slammed on his hand and pt states he has an infection. Pts Thumb is Red, warm and painful to touch.

## 2022-11-22 ENCOUNTER — Other Ambulatory Visit: Payer: Self-pay

## 2022-11-22 ENCOUNTER — Encounter (HOSPITAL_BASED_OUTPATIENT_CLINIC_OR_DEPARTMENT_OTHER): Payer: Self-pay | Admitting: Emergency Medicine

## 2022-11-22 ENCOUNTER — Emergency Department (HOSPITAL_BASED_OUTPATIENT_CLINIC_OR_DEPARTMENT_OTHER)
Admission: EM | Admit: 2022-11-22 | Discharge: 2022-11-22 | Disposition: A | Discharge status: Home / Self Care | Payer: Medicaid Other | Attending: Emergency Medicine | Admitting: Emergency Medicine

## 2022-11-22 DIAGNOSIS — E119 Type 2 diabetes mellitus without complications: Secondary | ICD-10-CM | POA: Diagnosis not present

## 2022-11-22 DIAGNOSIS — Z7984 Long term (current) use of oral hypoglycemic drugs: Secondary | ICD-10-CM | POA: Insufficient documentation

## 2022-11-22 DIAGNOSIS — Z794 Long term (current) use of insulin: Secondary | ICD-10-CM | POA: Diagnosis not present

## 2022-11-22 DIAGNOSIS — F17210 Nicotine dependence, cigarettes, uncomplicated: Secondary | ICD-10-CM | POA: Insufficient documentation

## 2022-11-22 DIAGNOSIS — L03113 Cellulitis of right upper limb: Secondary | ICD-10-CM

## 2022-11-22 HISTORY — DX: Neutropenia, unspecified: D70.9

## 2022-11-22 HISTORY — DX: Type 2 diabetes mellitus without complications: E11.9

## 2022-11-22 HISTORY — DX: Immune thrombocytopenic purpura: D69.3

## 2022-11-22 MED ORDER — CEPHALEXIN 500 MG PO CAPS: 500.0000 mg | ORAL_CAPSULE | Freq: Four times a day (QID) | ORAL | 0 refills | Status: DC

## 2022-11-22 MED ORDER — CEPHALEXIN 250 MG PO CAPS
1000.0000 mg | ORAL_CAPSULE | Freq: Once | ORAL | Status: AC
  Administered 2022-11-22: 1000 mg via ORAL
  Filled 2022-11-22: qty 4

## 2022-11-22 NOTE — ED Triage Notes (Signed)
  Patient comes in with wound infection to R hand/wrist.  Patient was seen 2 weeks ago for finger injury and given antibiotics.  States finger is better but wrist is now swollen, and red.  Patient states he has chronically low platelets and gets wound infections often.  Pain 5/10, throbbing.

## 2022-11-22 NOTE — ED Provider Notes (Signed)
[facility] Provider Note: Georgena Spurling, MD, FACEP  CSN: [id number] MRN: [id number] ARRIVAL: 11/22/22 at [place]: DBOBS1/DBOBS1   CHIEF COMPLAINT  Wound Infection   HISTORY OF PRESENT ILLNESS  11/22/22 3:26 AM Chase Reed is a 57 y.o. male with chronic thrombocytopenia and neutropenia which makes him susceptible to wound infections.  He recently injured his left hand and developed a wound there.  It was treated with Keflex and has subsequently healed.  While on antibiotics for that infection he developed an infection to the ulnar aspect of the right wrist.  It was improving on Keflex but when he ran out of Keflex the wound began getting worse.  He rates pain at the site is a 5 out of 10, throbbing in nature.  He has not had a fever with this.   Past Medical History:  Diagnosis Date   Chronic idiopathic thrombocytopenia (HCC)    Chronic neutropenia (HCC)    Cirrhosis (HCC)    DM (diabetes mellitus) ([place])    Hepatitis     Past Surgical History:  Procedure Laterality Date   APPLICATION OF WOUND VAC N/A 12/24/2018   Procedure: APPLICATION OF WOUND VAC;  Surgeon: Gaye Pollack, MD;  Location: [place];  Service: Thoracic;  Laterality: N/A;   APPLICATION OF WOUND VAC N/A 12/27/2018   Procedure: WOUND VAC CHANGE;  Surgeon: Gaye Pollack, MD;  Location: [place];  Service: Thoracic;  Laterality: N/A;   APPLICATION OF WOUND VAC N/A 01/14/2019   Procedure: APPLICATION OF WOUND VAC;  Surgeon: Gaye Pollack, MD;  Location: [place];  Service: Vascular;  Laterality: N/A;   I & D EXTREMITY N/A 01/14/2019   Procedure: DEBRIDEMENT OF STERNOCLAVICULAR JOINT;  Surgeon: Gaye Pollack, MD;  [facility];  Service: Vascular;  Laterality: N/A;   I & D EXTREMITY Right 10/03/2020   Procedure: Irrigation debridement of RIGHT forearm subfascial abscess measuring approximately 6 x 3 x 1.5 cm;  Surgeon: Verner Mould, MD;  Location: [place];  Service: Orthopedics;  Laterality: Right;    LITHOTRIPSY     STERNAL WOUND DEBRIDEMENT N/A 12/24/2018   Procedure: I&D CHEST WALL ABSCESS;  Surgeon: Gaye Pollack, MD;  Location: [place];  Service: Thoracic;  Laterality: N/A;   STERNAL WOUND DEBRIDEMENT N/A 12/27/2018   Procedure: DEBRIDEMENT OF CHEST WALL WOUND;  Surgeon: Gaye Pollack, MD;  [facility];  Service: Thoracic;  Laterality: N/A;   TEE WITHOUT CARDIOVERSION N/A 01/03/2019   Procedure: TRANSESOPHAGEAL ECHOCARDIOGRAM (TEE);  Surgeon: Sanda Klein, MD;  Location: [facility] ENDOSCOPY;  Service: Cardiovascular;  Laterality: N/A;    Family History  Problem Relation Age of Onset   COPD Father     Social History   Tobacco Use   Smoking status: Some Days    Packs/day: 0.25    Years: 30.00    Total pack years: 7.50    Types: Cigarettes   Smokeless tobacco: Former  Scientific laboratory technician Use: Never used  Substance Use Topics   Alcohol use: No   Drug use: Yes    Types: Heroin, IV    Comment: Fentanyl and "Oxy" per patient     Prior to Admission medications   Medication Sig Start Date End Date Taking? Authorizing Provider  cephALEXin (KEFLEX) 500 MG capsule Take 1 capsule (500 mg total) by mouth 4 (four) times daily. 11/22/22   Wilfred Dayrit, MD  DULoxetine (CYMBALTA) 60 MG capsule Take 1 capsule (60 mg  total) by mouth daily. 10/22/21   Salley Slaughter, NP  gabapentin (NEURONTIN) 600 MG tablet Take 600 mg by mouth in the morning, at noon, in the evening, and at bedtime.  01/20/19   [provider]  insulin aspart (NOVOLOG) 100 UNIT/ML injection Inject 2-8 Units into the skin See admin instructions. Take 2-8 units as needed  Sliding scale.over 250--2 units over 300--4 units over 350--6 units over 400--8 units Up to 50 units daily: For diabetes management Patient taking differently: Inject 2-8 Units into the skin See admin instructions. Inject 2-8 units into the skin one to four times a day as needed for elevated BGL, PER SLIDING SCALE- BGL over 250 = 2 units; over  300 = 4 units; over 350 = 6 units; over 400 = 8 units (Up to 50 units daily: For diabetes management) 10/20/21   Aline August, MD  insulin glargine (LANTUS) 100 UNIT/ML injection Inject 25-30 Units into the skin daily before breakfast.    [provider]  JANUVIA 100 MG tablet Take 100 mg by mouth daily. 05/07/21   [provider]  lisinopril (ZESTRIL) 10 MG tablet Take 10 mg by mouth daily as needed (for hypertension). 06/30/22   [provider]  magnesium oxide (MAG-OX) 400 MG tablet Take 800 mg by mouth daily. 06/30/22   [provider]  metFORMIN (GLUCOPHAGE) 1000 MG tablet Take 1,000 mg by mouth daily with breakfast.    [provider]  ondansetron (ZOFRAN-ODT) 8 MG disintegrating tablet Take 8 mg by mouth every 8 (eight) hours as needed for nausea. 01/13/22   [provider]  oxyCODONE (OXY IR/ROXICODONE) 5 MG immediate release tablet Take 1 tablet (5 mg total) by mouth every 4 (four) hours as needed for moderate pain. 08/27/22   Eugenie Filler, MD  potassium chloride (KLOR-CON) 10 MEQ tablet Take 1 tablet (10 mEq total) by mouth as needed. For severe leg cramps. 10/01/22 12/30/22  Swinyer, Lanice Schwab, NP  risperiDONE (RISPERDAL) 2 MG tablet Take 2 mg by mouth at bedtime. 06/02/22   [provider]  sertraline (ZOLOFT) 100 MG tablet Take 100 mg by mouth daily. 03/11/21   [provider]  traZODone (DESYREL) 100 MG tablet Take 100 mg by mouth at bedtime as needed for sleep.    [provider]    Allergies Atorvastatin   REVIEW OF SYSTEMS  Negative except as noted here or in the History of Present Illness.   PHYSICAL EXAMINATION  Initial Vital Signs Blood pressure (!) 172/119, pulse 82, temperature 98.2 F (36.8 C), temperature source Oral, resp. rate 17, height 6' (1.829 m), weight 79.4 kg, SpO2 100 %.  Examination General: Well-developed, thin male in no acute distress; appearance consistent with age of  record HENT: normocephalic; atraumatic Eyes: Normal appearance Neck: supple Heart: regular rate and rhythm Lungs: clear to auscultation bilaterally Abdomen: soft; nondistended; nontender; bowel sounds present Extremities: No deformity; full range of motion Neurologic: Awake, alert and oriented; motor function intact in all extremities and symmetric; no facial droop Skin: Warm and dry; tender, erythematous area right hand without fluctuance or pointing:    Psychiatric: Normal mood and affect   RESULTS  Summary of this visit's results, reviewed and interpreted by myself:   EKG Interpretation  Date/Time:    Ventricular Rate:    PR Interval:    QRS Duration:   QT Interval:    QTC Calculation:   R Axis:     Text Interpretation:  Laboratory Studies: No results found for this or any previous visit (from the past 24 hour(s)). Imaging Studies: No results found.  ED COURSE and MDM  Nursing notes, initial and subsequent vitals signs, including pulse oximetry, reviewed and interpreted by myself.  Vitals:   11/22/22 0316 11/22/22 0316  BP:  (!) 172/119  Pulse:  82  Resp:  17  Temp:  98.2 F (36.8 C)  TempSrc:  Oral  SpO2:  100%  Weight: 79.4 kg   Height: 6' (1.829 m)    Medications  cephALEXin (KEFLEX) capsule 1,000 mg (has no administration in time range)   The patient's right hand lesion is consistent with a focal cellulitis.  There is no associated lymphangitis.  There is no associated fluctuance to suggest an abscess.  We will restart on Keflex (10-day course given his known immune deficiency) and have him return if symptoms worsen.   PROCEDURES  Procedures   ED DIAGNOSES     ICD-10-CM   1. Cellulitis of right wrist  L03.113          Keishana Klinger, MD 11/22/22 [phone]

## 2022-12-08 ENCOUNTER — Encounter (HOSPITAL_BASED_OUTPATIENT_CLINIC_OR_DEPARTMENT_OTHER): Payer: Self-pay | Admitting: Radiology

## 2022-12-08 ENCOUNTER — Other Ambulatory Visit: Payer: Self-pay

## 2022-12-08 ENCOUNTER — Emergency Department (HOSPITAL_BASED_OUTPATIENT_CLINIC_OR_DEPARTMENT_OTHER)
Admission: EM | Admit: 2022-12-08 | Discharge: 2022-12-09 | Disposition: A | Discharge status: Home / Self Care | Payer: Medicaid Other | Attending: Emergency Medicine | Admitting: Emergency Medicine

## 2022-12-08 DIAGNOSIS — Z7984 Long term (current) use of oral hypoglycemic drugs: Secondary | ICD-10-CM | POA: Diagnosis not present

## 2022-12-08 DIAGNOSIS — F17210 Nicotine dependence, cigarettes, uncomplicated: Secondary | ICD-10-CM | POA: Diagnosis not present

## 2022-12-08 DIAGNOSIS — L03114 Cellulitis of left upper limb: Secondary | ICD-10-CM | POA: Insufficient documentation

## 2022-12-08 DIAGNOSIS — L0390 Cellulitis, unspecified: Secondary | ICD-10-CM

## 2022-12-08 DIAGNOSIS — Z794 Long term (current) use of insulin: Secondary | ICD-10-CM | POA: Insufficient documentation

## 2022-12-08 DIAGNOSIS — E119 Type 2 diabetes mellitus without complications: Secondary | ICD-10-CM | POA: Diagnosis not present

## 2022-12-08 DIAGNOSIS — M79642 Pain in left hand: Secondary | ICD-10-CM | POA: Diagnosis present

## 2022-12-08 LAB — CBC WITH DIFFERENTIAL/PLATELET
Abs Immature Granulocytes: 0.02 10*3/uL (ref 0.00–0.07)
Basophils Absolute: 0.1 10*3/uL (ref 0.0–0.1)
Basophils Relative: 0 %
Eosinophils Absolute: 0.1 10*3/uL (ref 0.0–0.5)
Eosinophils Relative: 0 %
HCT: 27.2 % — ABNORMAL LOW (ref 39.0–52.0)
Hemoglobin: 8.9 g/dL — ABNORMAL LOW (ref 13.0–17.0)
Immature Granulocytes: 1 %
Lymphocytes Relative: 9 %
Lymphs Abs: 0.2 10*3/uL — ABNORMAL LOW (ref 0.7–4.0)
MCH: 27.5 pg (ref 26.0–34.0)
MCHC: 32.7 g/dL (ref 30.0–36.0)
MCV: 84 fL (ref 80.0–100.0)
Monocytes Absolute: 0.1 10*3/uL (ref 0.1–1.0)
Monocytes Relative: 8 %
Neutro Abs: 1.5 10*3/uL — ABNORMAL LOW (ref 1.7–7.7)
Neutrophils Relative %: 80 %
Platelets: 36 10*3/uL — ABNORMAL LOW (ref 150–400)
RBC: 3.24 MIL/uL — ABNORMAL LOW (ref 4.22–5.81)
RDW: 16.3 % — ABNORMAL HIGH (ref 11.5–15.5)
WBC: 1.8 10*3/uL — ABNORMAL LOW (ref 4.0–10.5)
nRBC: 0 /100 WBC

## 2022-12-08 LAB — COMPREHENSIVE METABOLIC PANEL
ALT: 29 U/L (ref 0–44)
AST: 22 U/L (ref 15–41)
Albumin: 4.1 g/dL (ref 3.5–5.0)
Alkaline Phosphatase: 90 U/L (ref 38–126)
Anion gap: 9 (ref 5–15)
BUN: 20 mg/dL (ref 6–20)
CO2: 28 mmol/L (ref 22–32)
Calcium: 9.5 mg/dL (ref 8.9–10.3)
Chloride: 99 mmol/L (ref 98–111)
Creatinine, Ser: 1.14 mg/dL (ref 0.61–1.24)
GFR, Estimated: >60 mL/min (ref >60)
Glucose, Bld: 166 mg/dL — ABNORMAL HIGH (ref 70–99)
Potassium: 4.8 mmol/L (ref 3.5–5.1)
Sodium: 136 mmol/L (ref 135–145)
Total Bilirubin: 0.4 mg/dL (ref 0.3–1.2)
Total Protein: 7.7 g/dL (ref 6.5–8.1)

## 2022-12-08 LAB — LACTIC ACID, PLASMA: Lactic Acid, Venous: 1.2 mmol/L (ref 0.5–1.9)

## 2022-12-08 MED ORDER — DEXTROSE 5 % IV SOLN
1500.0000 mg | Freq: Once | INTRAVENOUS | Status: AC
  Administered 2022-12-09: 1500 mg via INTRAVENOUS
  Filled 2022-12-08: qty 75

## 2022-12-08 NOTE — ED Triage Notes (Signed)
Pt states he is a IV drug user and his left hand is infected. Pt left hand is red and warm to touch.

## 2022-12-08 NOTE — ED Provider Notes (Signed)
[facility] Provider Note: Georgena Spurling, MD, FACEP  CSN: [id number] MRN: [id number] ARRIVAL: 12/08/22 at [place]: [place]  Hand Pain   HISTORY OF PRESENT ILLNESS  12/08/22 11:03 PM Chase Reed is a 57 y.o. male with chronic neutropenia and thrombocytopenia which makes him predisposed to infections.  He is also an IV drug abuser.  He was treated on 11/09/2022 for cellulitis of the left hand and placed on Keflex with successful resolution.  I saw him on 11/22/2022 for cellulitis of the right wrist and again placed him on a 10-day course of Keflex with successful resolution.  He is now here with erythema and warmth of the left hand consistent with another infection.  This began several days ago and he states it is extremely painful.   Past Medical History:  Diagnosis Date   Chronic idiopathic thrombocytopenia (HCC)    Chronic neutropenia (HCC)    Cirrhosis (HCC)    DM (diabetes mellitus) ([place])    Hepatitis     Past Surgical History:  Procedure Laterality Date   APPLICATION OF WOUND VAC N/A 12/24/2018   Procedure: APPLICATION OF WOUND VAC;  Surgeon: Gaye Pollack, MD;  Location: [place];  Service: Thoracic;  Laterality: N/A;   APPLICATION OF WOUND VAC N/A 12/27/2018   Procedure: WOUND VAC CHANGE;  Surgeon: Gaye Pollack, MD;  Location: [place];  Service: Thoracic;  Laterality: N/A;   APPLICATION OF WOUND VAC N/A 01/14/2019   Procedure: APPLICATION OF WOUND VAC;  Surgeon: Gaye Pollack, MD;  Location: [place];  Service: Vascular;  Laterality: N/A;   I & D EXTREMITY N/A 01/14/2019   Procedure: DEBRIDEMENT OF STERNOCLAVICULAR JOINT;  Surgeon: Gaye Pollack, MD;  [facility];  Service: Vascular;  Laterality: N/A;   I & D EXTREMITY Right 10/03/2020   Procedure: Irrigation debridement of RIGHT forearm subfascial abscess measuring approximately 6 x 3 x 1.5 cm;  Surgeon: Verner Mould, MD;  Location: [place];  Service: Orthopedics;  Laterality:  Right;   LITHOTRIPSY     STERNAL WOUND DEBRIDEMENT N/A 12/24/2018   Procedure: I&D CHEST WALL ABSCESS;  Surgeon: Gaye Pollack, MD;  Location: [place];  Service: Thoracic;  Laterality: N/A;   STERNAL WOUND DEBRIDEMENT N/A 12/27/2018   Procedure: DEBRIDEMENT OF CHEST WALL WOUND;  Surgeon: Gaye Pollack, MD;  [facility];  Service: Thoracic;  Laterality: N/A;   TEE WITHOUT CARDIOVERSION N/A 01/03/2019   Procedure: TRANSESOPHAGEAL ECHOCARDIOGRAM (TEE);  Surgeon: Sanda Klein, MD;  [facility] END[facility]COPY;  Service: Cardiovascular;  Laterality: N/A;    Family History  Problem Relation Age of Onset   COPD Father     Social History   Tobacco Use   Smoking status: Some Days    Packs/day: 0.25    Years: 30.00    Total pack years: 7.50    Types: Cigarettes   Smokeless tobacco: Former  Scientific laboratory technician Use: Never used  Substance Use Topics   Alcohol use: No   Drug use: Yes    Types: Heroin, IV    Comment: Fentanyl and "Oxy" per patient     Prior to Admission medications   Medication Sig Start Date End Date Taking? Authorizing Provider  cephALEXin (KEFLEX) 500 MG capsule Take 1 capsule (500 mg total) by mouth 4 (four) times daily. 11/22/22   Ashlley Booher, MD  DULoxetine (CYMBALTA) 60 MG capsule Take 1 capsule (60 mg total) by mouth  daily. 10/22/21   Shanna Cisco, NP  gabapentin (NEURONTIN) 600 MG tablet Take 600 mg by mouth in the morning, at noon, in the evening, and at bedtime.  01/20/19   [provider]  insulin aspart (NOVOLOG) 100 UNIT/ML injection Inject 2-8 Units into the skin See admin instructions. Take 2-8 units as needed  Sliding scale.over 250--2 units over 300--4 units over 350--6 units over 400--8 units Up to 50 units daily: For diabetes management Patient taking differently: Inject 2-8 Units into the skin See admin instructions. Inject 2-8 units into the skin one to four times a day as needed for elevated BGL, PER SLIDING SCALE- BGL over 250 = 2  units; over 300 = 4 units; over 350 = 6 units; over 400 = 8 units (Up to 50 units daily: For diabetes management) 10/20/21   Glade Lloyd, MD  insulin glargine (LANTUS) 100 UNIT/ML injection Inject 25-30 Units into the skin daily before breakfast.    [provider]  JANUVIA 100 MG tablet Take 100 mg by mouth daily. 05/07/21   [provider]  lisinopril (ZESTRIL) 10 MG tablet Take 10 mg by mouth daily as needed (for hypertension). 06/30/22   [provider]  magnesium oxide (MAG-OX) 400 MG tablet Take 800 mg by mouth daily. 06/30/22   [provider]  metFORMIN (GLUCOPHAGE) 1000 MG tablet Take 1,000 mg by mouth daily with breakfast.    [provider]  ondansetron (ZOFRAN-ODT) 8 MG disintegrating tablet Take 8 mg by mouth every 8 (eight) hours as needed for nausea. 01/13/22   [provider]  oxyCODONE (OXY IR/ROXICODONE) 5 MG immediate release tablet Take 1 tablet (5 mg total) by mouth every 4 (four) hours as needed for moderate pain. 08/27/22   Rodolph Bong, MD  potassium chloride (KLOR-CON) 10 MEQ tablet Take 1 tablet (10 mEq total) by mouth as needed. For severe leg cramps. 10/01/22 12/30/22  Swinyer, Zachary George, NP  risperiDONE (RISPERDAL) 2 MG tablet Take 2 mg by mouth at bedtime. 06/02/22   [provider]  sertraline (ZOLOFT) 100 MG tablet Take 100 mg by mouth daily. 03/11/21   [provider]  traZODone (DESYREL) 100 MG tablet Take 100 mg by mouth at bedtime as needed for sleep.    [provider]    Allergies Atorvastatin   REVIEW OF SYSTEMS  Negative except as noted here or in the History of Present Illness.   PHYSICAL EXAMINATION  Initial Vital Signs Blood pressure 139/88, pulse 71, temperature 98 F (36.7 C), resp. rate 16, height 6' (1.829 m), weight 75.8 kg, SpO2 100 %.  Examination General: Well-developed, well-nourished male in no acute distress; appearance consistent with age of  record HENT: normocephalic; atraumatic Eyes: Normal appearance Neck: supple Heart: regular rate and rhythm Lungs: clear to auscultation bilaterally Abdomen: soft; nondistended; nontender; bowel sounds present Extremities: No deformity; full range of motion Neurologic: Awake, alert and oriented; motor function intact in all extremities and symmetric; no facial droop Skin: Warm and dry; erythema and warmth of dorsal left hand:    Psychiatric: Normal mood and affect   RESULTS  Summary of this visit's results, reviewed and interpreted by myself:   EKG Interpretation  Date/Time:    Ventricular Rate:    PR Interval:    QRS Duration:   QT Interval:    QTC Calculation:   R Axis:     Text Interpretation:         Laboratory Studies: Results for orders  placed or performed during the hospital encounter of 12/08/22 (from the past 24 hour(s))  Lactic acid, plasma     Status: None   Collection Time: 12/08/22  6:43 PM  Result Value Ref Range   Lactic Acid, Venous 1.2 0.5 - 1.9 mmol/L  Comprehensive metabolic panel     Status: Abnormal   Collection Time: 12/08/22  6:43 PM  Result Value Ref Range   Sodium 136 135 - 145 mmol/L   Potassium 4.8 3.5 - 5.1 mmol/L   Chloride 99 98 - 111 mmol/L   CO2 28 22 - 32 mmol/L   Glucose, Bld 166 (H) 70 - 99 mg/dL   BUN 20 6 - 20 mg/dL   Creatinine, Ser 1.14 0.61 - 1.24 mg/dL   Calcium 9.5 8.9 - 10.3 mg/dL   Total Protein 7.7 6.5 - 8.1 g/dL   Albumin 4.1 3.5 - 5.0 g/dL   AST 22 15 - 41 U/L   ALT 29 0 - 44 U/L   Alkaline Phosphatase 90 38 - 126 U/L   Total Bilirubin 0.4 0.3 - 1.2 mg/dL   GFR, Estimated >60 >60 mL/min   Anion gap 9 5 - 15  CBC with Differential     Status: Abnormal   Collection Time: 12/08/22  6:43 PM  Result Value Ref Range   WBC 1.8 (L) 4.0 - 10.5 K/uL   RBC 3.24 (L) 4.22 - 5.81 MIL/uL   Hemoglobin 8.9 (L) 13.0 - 17.0 g/dL   HCT 27.2 (L) 39.0 - 52.0 %   MCV 84.0 80.0 - 100.0 fL   MCH 27.5 26.0 - 34.0 pg   MCHC 32.7  30.0 - 36.0 g/dL   RDW 16.3 (H) 11.5 - 15.5 %   Platelets 36 (L) 150 - 400 K/uL   Neutrophils Relative % 80 %   Neutro Abs 1.5 (L) 1.7 - 7.7 K/uL   Lymphocytes Relative 9 %   Lymphs Abs 0.2 (L) 0.7 - 4.0 K/uL   Monocytes Relative 8 %   Monocytes Absolute 0.1 0.1 - 1.0 K/uL   Eosinophils Relative 0 %   Eosinophils Absolute 0.1 0.0 - 0.5 K/uL   Basophils Relative 0 %   Basophils Absolute 0.1 0.0 - 0.1 K/uL   RBC Morphology OVALOCYTES    Smear Review Reviewed    nRBC 0 0 /100 WBC   Immature Granulocytes 1 %   Abs Immature Granulocytes 0.02 0.00 - 0.07 K/uL   Imaging Studies: No results found.  ED COURSE and MDM  Nursing notes, initial and subsequent vitals signs, including pulse oximetry, reviewed and interpreted by myself.  Vitals:   12/08/22 1830 12/08/22 1831 12/08/22 2325  BP:  139/88 138/87  Pulse:  71 73  Resp:  16 18  Temp:  98 F (36.7 C) 98 F (36.7 C)  TempSrc:   Oral  SpO2:  100% 100%  Weight: 75.8 kg    Height: 6' (1.829 m)     Medications  dalbavancin (DALVANCE) 1,500 mg in dextrose 5 % 500 mL IVPB (1,500 mg Intravenous New Bag/Given 12/09/22 0041)   11:20 PM Will give Dalvance IV in the ED and refer the patient to infectious disease clinic (patient is already established with Dr. Linus Salmons).  With immune suppression, IV drug use and recurrent infections I believe he is a candidate for this protocol.  PROCEDURES  Procedures   ED DIAGNOSES     ICD-10-CM   1. Recurrent cellulitis  L03.90  Raylyn Speckman, Jenny Reichmann, MD 12/09/22 [phone]

## 2022-12-08 NOTE — ED Notes (Signed)
1 blood culture in lab if needed.

## 2022-12-09 ENCOUNTER — Other Ambulatory Visit (HOSPITAL_COMMUNITY): Payer: Self-pay

## 2022-12-17 ENCOUNTER — Ambulatory Visit: Payer: Medicaid Other | Admitting: Internal Medicine

## 2022-12-17 NOTE — Progress Notes (Deleted)
[facility] for Infectious Disease  CHIEF COMPLAINT:    Follow up for cellulitis  SUBJECTIVE:    Chase Reed is a 57 y.o. male with PMHx as below who presents to the clinic for cellulitis.   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  This patient was seen in the emergency department several times over the last month.  He was seen on 1/7 for cellulitis of the left hand and placed on Keflex with resolution.  He was reevaluated on 1/20 for cellulitis of the right wrist and again placed on Keflex with resolution.  He more recently was seen on 2/5 with erythema and warmth of the left hand consistent with another cellulitis episode.  He reported that there was a lot of pain associated with this.  He was given a dose of dalbavancin in the emergency department and discharged home.  Of note, patient was admitted to the hospital in October 2023 with right hand tenosynovitis and small abscesses.  He was seen by hand surgery during that admission but improved on antibiotics and I&D was deferred.  He was seen by Dr. Tommy Medal during that hospitalization and transitioned to doxycycline and cefadroxil x 4 weeks through 09/24/2022.  He was scheduled for follow-up with Dr. Tommy Medal in November but did not keep that appointment.  He presents today and reports ***.  Please see A&P for the details of today's visit and status of the patient's medical problems.   Patient's Medications  New Prescriptions   No medications on file  Previous Medications   CEPHALEXIN (KEFLEX) 500 MG CAPSULE    Take 1 capsule (500 mg total) by mouth 4 (four) times daily.   DULOXETINE (CYMBALTA) 60 MG CAPSULE    Take 1 capsule (60 mg total) by mouth  daily.   GABAPENTIN (NEURONTIN) 600 MG TABLET    Take 600 mg by mouth in the morning, at noon, in the evening, and at bedtime.    INSULIN ASPART (NOVOLOG) 100 UNIT/ML INJECTION    Inject 2-8 Units into the skin See admin instructions. Take 2-8 units as needed  Sliding scale.over 250--2 units over 300--4 units over 350--6 units over 400--8 units Up to 50 units daily: For diabetes management   INSULIN GLARGINE (LANTUS) 100 UNIT/ML INJECTION    Inject 25-30 Units into the skin daily before breakfast.   JANUVIA 100 MG TABLET    Take 100 mg by mouth daily.   LISINOPRIL (ZESTRIL) 10 MG TABLET    Take 10 mg by mouth daily as needed (for hypertension).   MAGNESIUM OXIDE (MAG-OX) 400 MG TABLET    Take 800 mg by mouth daily.   METFORMIN (GLUCOPHAGE) 1000 MG TABLET    Take 1,000 mg by mouth daily with breakfast.   ONDANSETRON (ZOFRAN-ODT) 8 MG DISINTEGRATING TABLET    Take 8 mg by mouth every 8 (eight) hours as needed for nausea.   OXYCODONE (OXY IR/ROXICODONE) 5 MG IMMEDIATE RELEASE TABLET    Take 1 tablet (5 mg total) by mouth every 4 (four) hours as needed for moderate pain.   POTASSIUM CHLORIDE (KLOR-CON) 10 MEQ TABLET    Take 1 tablet (10 mEq total) by mouth as needed. For severe leg cramps.   RISPERIDONE (  RISPERDAL) 2 MG TABLET    Take 2 mg by mouth at bedtime.   SERTRALINE (ZOLOFT) 100 MG TABLET    Take 100 mg by mouth daily.   TRAZODONE (DESYREL) 100 MG TABLET    Take 100 mg by mouth at bedtime as needed for sleep.  Modified Medications   No medications on file  Discontinued Medications   No medications on file      Past Medical History:  Diagnosis Date   Chronic idiopathic thrombocytopenia (HCC)    Chronic neutropenia (HCC)    Cirrhosis (HCC)    DM (diabetes mellitus) ([place])    Hepatitis     Social History   Tobacco Use   Smoking status: Some Days    Packs/day: 0.25    Years: 30.00    Total pack years: 7.50    Types: Cigarettes   Smokeless tobacco: Former  Scientific laboratory technician  Use: Never used  Substance Use Topics   Alcohol use: No   Drug use: Yes    Types: Heroin, IV    Comment: Fentanyl and "Oxy" per patient     Family History  Problem Relation Age of Onset   COPD Father     Allergies  Allergen Reactions   Atorvastatin Other (See Comments)    Leg cramps and body aches    ROS   OBJECTIVE:    There were no vitals filed for this visit. There is no height or weight on file to calculate BMI.  Physical Exam   Labs and Microbiology:    Latest Ref Rng & Units 12/08/2022    6:43 PM 08/27/2022    5:48 AM 08/26/2022    3:28 AM  CBC  WBC 4.0 - 10.5 K/uL 1.8  1.3  1.7   Hemoglobin 13.0 - 17.0 g/dL 8.9  9.2  9.5   Hematocrit 39.0 - 52.0 % 27.2  28.5  30.1   Platelets 150 - 400 K/uL 36  28  35       Latest Ref Rng & Units 12/08/2022    6:43 PM 08/27/2022    5:48 AM 08/26/2022    3:28 AM  CMP  Glucose 70 - 99 mg/dL 166  200  102   BUN 6 - 20 mg/dL 20  13  12   $ Creatinine 0.61 - 1.24 mg/dL 1.14  0.91  0.91   Sodium 135 - 145 mmol/L 136  136  137   Potassium 3.5 - 5.1 mmol/L 4.8  3.9  4.0   Chloride 98 - 111 mmol/L 99  103  103   CO2 22 - 32 mmol/L 28  24  26   $ Calcium 8.9 - 10.3 mg/dL 9.5  8.5  8.9   Total Protein 6.5 - 8.1 g/dL 7.7  6.4  6.9   Total Bilirubin 0.3 - 1.2 mg/dL 0.4  0.3  0.4   Alkaline Phos 38 - 126 U/L 90  98  81   AST 15 - 41 U/L 22  45  25   ALT 0 - 44 U/L 29  33  22      No results found for this or any previous visit (from the past 240 hour(s)).  Imaging: ***   ASSESSMENT & PLAN:    No problem-specific Assessment & Plan notes found for this encounter.   No orders of the defined types were placed in this encounter.    There are no diagnoses linked to this encounter.  ***   Mignon Pine  [facility] 12/17/2022, 8:18 AM

## 2022-12-22 ENCOUNTER — Other Ambulatory Visit: Payer: Self-pay | Admitting: *Deleted

## 2022-12-22 MED ORDER — POTASSIUM CHLORIDE ER 10 MEQ PO TBCR: 10.0000 meq | EXTENDED_RELEASE_TABLET | ORAL | 3 refills | Status: DC | PRN

## 2022-12-31 ENCOUNTER — Ambulatory Visit (INDEPENDENT_AMBULATORY_CARE_PROVIDER_SITE_OTHER): Payer: Medicaid Other | Admitting: Internal Medicine

## 2022-12-31 ENCOUNTER — Other Ambulatory Visit: Payer: Self-pay

## 2022-12-31 ENCOUNTER — Encounter: Payer: Self-pay | Admitting: Internal Medicine

## 2022-12-31 VITALS — BP 102/66 | HR 50 | Temp 98.0°F | Wt 181.8 lb

## 2022-12-31 DIAGNOSIS — L0390 Cellulitis, unspecified: Secondary | ICD-10-CM

## 2022-12-31 NOTE — Patient Instructions (Signed)
Continue to do compression/massage of the hardness and epsom's salt warm water soak twice a day for the next week or two  Continue to do hand exercise as much as possible  If fever, chill, increased pain/redness/fluctuance, let us know

## 2022-12-31 NOTE — Progress Notes (Signed)
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 01/08/2021   MDD (major depressive disorder) 12/18/2020   Nicotine dependence 10/03/2020   Essential hypertension 10/03/2020   Type 2 diabetes mellitus with diabetic polyneuropathy, with long-term current use of insulin ([place]) 10/03/2020   Diabetic peripheral neuropathy ([place]) 06/04/2020   Major depressive disorder, recurrent episode, moderate with anxious distress ([place]) 05/04/2020   Pancytopenia ([place]) 02/03/2019   Chronic hepatitis C with cirrhosis ([place]) 02/03/2019   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 male here for f/u hospital cellulitis   He had cellulitis left hand about 4 weeks ago. Came to [facility] ed given dalvance  He was taking cephalexin and doxy prior to that  His cellulitis had gone. There is some hardness of tissue on dorsum of hand over the over the 4th and 4th metacarpal areas but getting better.   He has no other concern   Review of Systems: ROS       Past Medical History:  Diagnosis Date   Chronic idiopathic thrombocytopenia (HCC)    Chronic neutropenia (HCC)    Cirrhosis (HCC)    DM (diabetes mellitus) ([place])    Hepatitis     Social History    Tobacco Use   Smoking status: Some Days    Packs/day: 0.25    Years: 30.00    Total pack years: 7.50    Types: Cigarettes   Smokeless tobacco: Former  Scientific laboratory technician Use: Never used  Substance Use Topics   Alcohol use: No   Drug use: Yes    Types: Heroin, IV    Comment: Fentanyl and "Oxy" per patient     Family History  Problem Relation Age of Onset   COPD Father     Allergies  Allergen Reactions   Atorvastatin Other (See Comments)    Leg cramps and body aches    OBJECTIVE: Vitals:   12/31/22 1444  BP: 102/66  Pulse: (!) 50  Temp: 98 F (36.7 C)  TempSrc: Oral  SpO2: 99%  Weight: 181 lb 12.8 oz (82.5 kg)   Body mass index is 24.66 kg/m.   Physical Exam General/constitutional: no distress, pleasant HEENT: Normocephalic, PER, Conj Clear, EOMI, Oropharynx clear Neck supple CV: rrr no mrg Lungs: clear to auscultation, normal respiratory effort Abd: Soft, Nontender Ext: no edema Skin: No Rash Neuro: nonfocal MSK: left hand dorsum slight induration but not fluctuant/tender dorsum 4th/5th metacarpal area  Lab:  Microbiology:  Serology:  Imaging:   Assessment/plan: Problem List Items Addressed This Visit   None Visit Diagnoses     Cellulitis, unspecified cellulitis site    -  Primary         Cellulitis resolved. Slight fibrotic process  it appears rather than an abscess on his left hand-- this too is improving  Advise compress/massage therapy and epsom's salt hand soak twice a day  F/u as needed if increased pain or fever, chill or redness   I have spent a total of 40 minutes of face-to-face and non-face-to-face time, excluding clinical staff time, preparing to see patient, ordering tests and/or medications, and provide counseling the patient    Follow-up: No follow-ups on file.  Jabier Mutton, [facility] 12/31/2022, 3:25 PM

## 2023-01-08 ENCOUNTER — Other Ambulatory Visit (HOSPITAL_BASED_OUTPATIENT_CLINIC_OR_DEPARTMENT_OTHER): Payer: Medicaid Other

## 2023-04-01 ENCOUNTER — Ambulatory Visit (INDEPENDENT_AMBULATORY_CARE_PROVIDER_SITE_OTHER): Payer: Medicaid Other | Admitting: Cardiology

## 2023-04-01 ENCOUNTER — Encounter (HOSPITAL_BASED_OUTPATIENT_CLINIC_OR_DEPARTMENT_OTHER): Payer: Self-pay | Admitting: Cardiology

## 2023-04-01 VITALS — BP 110/82 | HR 86 | Ht 72.0 in | Wt 183.8 lb

## 2023-04-01 DIAGNOSIS — R002 Palpitations: Secondary | ICD-10-CM | POA: Diagnosis not present

## 2023-04-01 DIAGNOSIS — Z794 Long term (current) use of insulin: Secondary | ICD-10-CM

## 2023-04-01 DIAGNOSIS — Z8679 Personal history of other diseases of the circulatory system: Secondary | ICD-10-CM

## 2023-04-01 DIAGNOSIS — R252 Cramp and spasm: Secondary | ICD-10-CM

## 2023-04-01 DIAGNOSIS — R0789 Other chest pain: Secondary | ICD-10-CM

## 2023-04-01 DIAGNOSIS — K7469 Other cirrhosis of liver: Secondary | ICD-10-CM

## 2023-04-01 DIAGNOSIS — E119 Type 2 diabetes mellitus without complications: Secondary | ICD-10-CM

## 2023-04-01 DIAGNOSIS — Z87898 Personal history of other specified conditions: Secondary | ICD-10-CM

## 2023-04-01 NOTE — Patient Instructions (Signed)
Medication Instructions:  Your physician recommends that you continue on your current medications as directed. Please refer to the Current Medication list given to you today.  *If you need a refill on your cardiac medications before your next appointment, please call your pharmacy*  Lab Work: NONE  Testing/Procedures: NONE  Follow-Up: At [facility], you and your health needs are our priority.  As part of our continuing mission to provide you with exceptional heart care, we have created designated Provider Care Teams.  These Care Teams include your primary Cardiologist (physician) and Advanced Practice Providers (APPs -  Physician Assistants and Nurse Practitioners) who all work together to provide you with the care you need, when you need it.  We recommend signing up for the patient portal called "MyChart".  Sign up information is provided on this After Visit Summary.  MyChart is used to connect with patients for Virtual Visits (Telemedicine).  Patients are able to view lab/test results, encounter notes, upcoming appointments, etc.  Non-urgent messages can be sent to your provider as well.   To learn more about what you can do with MyChart, go to [email or web address].    Your next appointment:   12 month(s)  The format for your next appointment:   In Person  Provider:   Bridgette Christopher, MD     

## 2023-04-01 NOTE — Progress Notes (Signed)
Cardiology Office Note:    Date:  04/01/2023   ID:  Chase Reed, DOB 02-11-1966, MRN [id number]  PCP:  Courtney Heys, PA-C  Cardiologist:  Jodelle Red, MD  Referring MD: Courtney Heys,*   CC: follow-up  History of Present Illness:    Chase Reed is a 57 y.o. male with a hx of type II diabetes, chronic hepatitis C on epclusa with cirrhosis,  who is seen for follow-up. I initially met him 07/01/2021 as a new consult at the request of Courtney Heys,* for the evaluation and management of syncope.  Today: Overall stable. Has intermittent dizzy spells 4-5 times/week, no syncope. Had a fall after being dizzy a few weeks ago. No clear aggravating/alleviating factors. Resolve spontaneously. Palpitations happen occasionally, not related to dizzy spells.  Chest pain also intermittently, both left and right side, feels like a cramp, better with stretching his arms out. Has diffuse intermittent severe cramps that cause significant pain. Stays hydrated, supplements potassium and magnesium with some improvement. Worse after being active during the day. Can bring on some dizzy spells by looking up, then has a sensation of vertigo. Not all dizzy spells are vertigo.  Past Medical History:  Diagnosis Date   Chronic idiopathic thrombocytopenia (HCC)    Chronic neutropenia (HCC)    Cirrhosis (HCC)    DM (diabetes mellitus) (HCC)    Hepatitis     Past Surgical History:  Procedure Laterality Date   APPLICATION OF WOUND VAC N/A 12/24/2018   Procedure: APPLICATION OF WOUND VAC;  Surgeon: Alleen Borne, MD;  Location: [facility] OR;  Service: Thoracic;  Laterality: N/A;   APPLICATION OF WOUND VAC N/A 12/27/2018   Procedure: WOUND VAC CHANGE;  Surgeon: Alleen Borne, MD;  Location: [facility] OR;  Service: Thoracic;  Laterality: N/A;   APPLICATION OF WOUND VAC N/A 01/14/2019   Procedure: APPLICATION OF WOUND VAC;  Surgeon: Alleen Borne, MD;  Location: [facility] OR;  Service:  Vascular;  Laterality: N/A;   I & D EXTREMITY N/A 01/14/2019   Procedure: DEBRIDEMENT OF STERNOCLAVICULAR JOINT;  Surgeon: Alleen Borne, MD;  Location: [facility] OR;  Service: Vascular;  Laterality: N/A;   I & D EXTREMITY Right 10/03/2020   Procedure: Irrigation debridement of RIGHT forearm subfascial abscess measuring approximately 6 x 3 x 1.5 cm;  Surgeon: Ernest Mallick, MD;  Location: [facility] OR;  Service: Orthopedics;  Laterality: Right;   LITHOTRIPSY     STERNAL WOUND DEBRIDEMENT N/A 12/24/2018   Procedure: I&D CHEST WALL ABSCESS;  Surgeon: Alleen Borne, MD;  Location: [facility] OR;  Service: Thoracic;  Laterality: N/A;   STERNAL WOUND DEBRIDEMENT N/A 12/27/2018   Procedure: DEBRIDEMENT OF CHEST WALL WOUND;  Surgeon: Alleen Borne, MD;  Location: [facility] OR;  Service: Thoracic;  Laterality: N/A;   TEE WITHOUT CARDIOVERSION N/A 01/03/2019   Procedure: TRANSESOPHAGEAL ECHOCARDIOGRAM (TEE);  Surgeon: Thurmon Fair, MD;  Location: [facility] ENDOS[facility];  Service: Cardiovascular;  Laterality: N/A;    Current Medications: Current Outpatient Medications on File Prior to Visit  Medication Sig   DULoxetine (CYMBALTA) 60 MG capsule Take 1 capsule (60 mg total) by mouth daily.   gabapentin (NEURONTIN) 600 MG tablet Take 600 mg by mouth in the morning, at noon, in the evening, and at bedtime.    insulin aspart (NOVOLOG) 100 UNIT/ML injection Inject 2-8 Units into the skin See admin instructions. Take 2-8 units as needed  Sliding scale.over 250--2 units over 300--4 units over 350--6 units  over 400--8 units Up to 50 units daily: For diabetes management (Patient taking differently: Inject 2-8 Units into the skin See admin instructions. Inject 2-8 units into the skin one to four times a day as needed for elevated BGL, PER SLIDING SCALE- BGL over 250 = 2 units; over 300 = 4 units; over 350 = 6 units; over 400 = 8 units (Up to 50 units daily: For diabetes management))   insulin glargine (LANTUS) 100 UNIT/ML injection Inject  25-30 Units into the skin daily before breakfast.   JANUVIA 100 MG tablet Take 100 mg by mouth daily.   magnesium oxide (MAG-OX) 400 MG tablet Take 800 mg by mouth daily.   metFORMIN (GLUCOPHAGE) 1000 MG tablet Take 1,000 mg by mouth daily with breakfast.   ondansetron (ZOFRAN-ODT) 8 MG disintegrating tablet Take 8 mg by mouth every 8 (eight) hours as needed for nausea.   risperiDONE (RISPERDAL) 2 MG tablet Take 2 mg by mouth at bedtime.   sertraline (ZOLOFT) 100 MG tablet Take 100 mg by mouth daily.   traZODone (DESYREL) 100 MG tablet Take 100 mg by mouth at bedtime as needed for sleep.   naloxone (NARCAN) nasal spray 4 mg/0.1 mL CALL 911 Use ONE spray in one nostril for suspected overdose. May repeat ONE spray in the other nostril after 2 to 3 minutes. (Patient not taking: Reported on 12/31/2022)   oxyCODONE (OXY IR/ROXICODONE) 5 MG immediate release tablet Take 1 tablet (5 mg total) by mouth every 4 (four) hours as needed for moderate pain. (Patient not taking: Reported on 12/31/2022)   potassium chloride (KLOR-CON) 10 MEQ tablet Take 1 tablet (10 mEq total) by mouth as needed. For severe leg cramps.   No current facility-administered medications on file prior to visit.     Allergies:   Atorvastatin   Social History   Tobacco Use   Smoking status: Some Days    Packs/day: 0.25    Years: 30.00    Additional pack years: 0.00    Total pack years: 7.50    Types: Cigarettes   Smokeless tobacco: Former  Building services engineer Use: Never used  Substance Use Topics   Alcohol use: No   Drug use: Yes    Types: Heroin, IV    Comment: Fentanyl and "Oxy" per patient     Family History: family history includes COPD in his father.  ROS:   Please see the history of present illness. All other systems are reviewed and negative.    EKGs/Labs/Other Studies Reviewed:    The following studies were reviewed today:  CT Angio 10/18/2021: FINDINGS: Cardiovascular: No filling defects within the  pulmonary arteries to suggest acute pulmonary embolism. Coronary artery calcification and aortic atherosclerotic calcification.   Mediastinum/Nodes: No mediastinal adenopathy. No axillary or supraclavicular adenopathy. Medial to the sternal wound with deformity of the manubrium.   Lungs/Pleura: Segmental airspace disease in the anterior aspect of the RIGHT upper lobe with mild nodularity (image 58/7). No pleural fluid. No pneumothorax   Upper Abdomen: 6 Limited view of the liver, kidneys, pancreas are unremarkable. Normal adrenal glands. Nodular contour of the liver. Spleen is enlarged. Gallstones noted.   Musculoskeletal: No aggressive osseous lesion. Remote sternal fracture with interval healing   Review of the MIP images confirms the above findings.   IMPRESSION: 1. RIGHT upper lobe nodular airspace disease is most suggestive of pulmonary infection. Pulmonary hemorrhage could have a similar appearance. Combination of infection with hemorrhage could also be considered in patient presenting  with hemoptysis. 2. No acute pulmonary embolism. 3. No mediastinal lymphadenopathy. 4. No pleural fluid. 5. Remote healed sternal wound.  TEE 01/03/2019 1. The left ventricle has normal systolic function, with an ejection  fraction of 60-65%. The cavity size was normal.   2. The right ventricle has normal systolc function. The cavity was  normal. There is no increase in right ventricular wall thickness.   3. The mitral valve is normal in structure.   4. The tricuspid valve was normal in structure.   5. The aortic valve is normal in structure.   6. The pulmonic valve was normal in structure.   7. The aortic root, ascending aorta, aortic arch and descending aorta are  normal in size and structure.   8. There is evidence of plaque in the descending aorta.   9. No echo evidence of endocarditis.   Echo 12/24/2018 1. The left ventricle has hyperdynamic systolic function, with an  ejection  fraction of >65%. The cavity size was normal. Left ventricular  diastolic parameters were normal.   2. The right ventricle has normal systolic function. The cavity was  normal. There is no increase in right ventricular wall thickness. Right  ventricular systolic pressure could not be assessed.   3. Trivial pericardial effusion is present.   4. The pericardial effusion is circumferential.   5. No hemodynamically significant valvular stenosis or regurgitation.   6. The mitral valve is normal in structure. Mild calcification of the  mitral valve leaflet.   7. The tricuspid valve is normal in structure.   8. The aortic valve has an indeterminant number of cusps.   9. The pulmonic valve was normal in structure.  10. No definite valvular vegetations. Image quality is not sufficient for  very small valvular lesions.   EKG:  EKG is personally reviewed.   04/01/23: NSR at 86 bpm 02/05/2022: NSR at 77 bpm 10/11/2021: SR at 86 bpm with [facility] 07/01/2021 Sinus bradycardia at 46 bpm  Recent Labs: 08/27/2022: Magnesium 1.8 12/08/2022: ALT 29; BUN 20; Creatinine, Ser 1.14; Hemoglobin 8.9; Platelets 36; Potassium 4.8; Sodium 136   Recent Lipid Panel No results found for: "CHOL", "TRIG", "HDL", "CHOLHDL", "VLDL", "LDLCALC", "LDLDIRECT"  Physical Exam:    VS:  BP 110/82 (BP Location: Left Arm, Patient Position: Sitting, Cuff Size: Normal)   Pulse 86   Ht 6' (1.829 m)   Wt 183 lb 12.8 oz (83.4 kg)   BMI 24.93 kg/m      Wt Readings from Last 3 Encounters:  04/01/23 183 lb 12.8 oz (83.4 kg)  12/31/22 181 lb 12.8 oz (82.5 kg)  12/08/22 167 lb (75.8 kg)    GEN: Well nourished, well developed in no acute distress HEENT: Normal, moist mucous membranes NECK: No JVD CARDIAC: regular rhythm, normal S1 and S2, no rubs or gallops. 1/6 systolic murmur. VASCULAR: Radial and DP pulses 2+ bilaterally. No carotid bruits RESPIRATORY:  Clear to auscultation without rales, wheezing or rhonchi  ABDOMEN: Soft,  non-tender, non-distended MUSCULOSKELETAL:  Ambulates independently SKIN: Warm and dry, no edema NEUROLOGIC:  Alert and oriented x 3. No focal neuro deficits noted. PSYCHIATRIC:  Normal affect     ASSESSMENT:    1. History of hypotension   2. History of syncope   3. Heart palpitations   4. Bilateral leg cramps   5. Type 2 diabetes mellitus without complication, with long-term current use of insulin (HCC)   6. Atypical chest pain   7. Other cirrhosis of liver (HCC)  PLAN:    Dizziness -some are clearly vertigo (looking up and room spins), others are nonspecific -no syncope  Palpitations -not related to his syncope -discussed options for further evaluation. Will contact me if symptoms worsen  Atypical chest pain -reviewed red flag warning signs that need immediate medical attention  Murmur -very soft today, asymptomatic  History of syncope/hypotension Hypertension  -echocardiogram (last 2020) reordered, not performed -blood pressures now running more low than high. He almost never takes the lisinopril. With low BP, will discontinue lisinopril on his list. Has stopped taking amlodipine. On no antihypertensives today. -given diabetes, may have a component of autonomic dysfunction -recommended slow position changes.  History of recurrent infections, chest wounds -last debridement of sternal wounds 01/2019  Type II diabetes -last A1c 5.3 per [facility] -insulin per PCP, on sitagliptin as well -would typically recommend statin/aspirin, but given cirrhosis and pancytopenia, will avoid these  History of hepatitis C Pancytopenia History of cirrhosis -S/P epclusa  Cardiac risk counseling and prevention recommendations: -recommend heart healthy/Mediterranean diet, with whole grains, fruits, vegetable, fish, lean meats, nuts, and olive oil. Limit salt. -recommend moderate walking, 3-5 times/week for 30-50 minutes each session. Aim for at least 150 minutes.week. Goal should be  pace of 3 miles/hours, or walking 1.5 miles in 30 minutes -recommend avoidance of tobacco products. Avoid excess alcohol. -ASCVD risk score: The ASCVD Risk score (Arnett DK, et al., 2019) failed to calculate for the following reasons:   Cannot find a previous HDL lab    Plan for follow up: 6 mos or sooner as needed  Jodelle Red, MD, PhD, [facility]  [facility]    Signed, Jodelle Red, MD PhD 04/01/2023     [facility]

## 2023-04-20 ENCOUNTER — Encounter (HOSPITAL_COMMUNITY): Payer: Self-pay

## 2023-04-20 ENCOUNTER — Emergency Department (HOSPITAL_COMMUNITY)
Admission: EM | Admit: 2023-04-20 | Discharge: 2023-04-20 | Disposition: A | Discharge status: Home / Self Care | Payer: Medicaid Other | Attending: Emergency Medicine | Admitting: Emergency Medicine

## 2023-04-20 ENCOUNTER — Other Ambulatory Visit: Payer: Self-pay

## 2023-04-20 DIAGNOSIS — H5711 Ocular pain, right eye: Secondary | ICD-10-CM | POA: Diagnosis present

## 2023-04-20 MED ORDER — ERYTHROMYCIN 5 MG/GM OP OINT
1.0000 | TOPICAL_OINTMENT | Freq: Once | OPHTHALMIC | Status: AC
  Administered 2023-04-20: 1 via OPHTHALMIC
  Filled 2023-04-20: qty 3.5

## 2023-04-20 MED ORDER — FLUORESCEIN SODIUM 1 MG OP STRP
1.0000 | ORAL_STRIP | Freq: Once | OPHTHALMIC | Status: AC
  Administered 2023-04-20: 1 via OPHTHALMIC
  Filled 2023-04-20: qty 1

## 2023-04-20 MED ORDER — TETRACAINE HCL 0.5 % OP SOLN
2.0000 [drp] | Freq: Once | OPHTHALMIC | Status: AC
  Administered 2023-04-20: 2 [drp] via OPHTHALMIC
  Filled 2023-04-20: qty 4

## 2023-04-20 NOTE — ED Notes (Signed)
Visual acuity completed.  Pt could not read anything with right eye.  Pt refused to do distance due to light.

## 2023-04-20 NOTE — ED Triage Notes (Signed)
Patient arrives POV c/o potential foreign body in right and light sensitivity. Patient has patch over right eye to "block out light" as light and trying to focus "to look at something close" worsens pain. Patient reports increasing tearing in right eye. Patient states he wears contacts lenses and has "scratched his corneas before."

## 2023-04-20 NOTE — ED Provider Notes (Signed)
[place] EMERGENCY DEPARTMENT AT [facility] Provider Note   CSN: [id number] Arrival date & time: 04/20/23  0139     History  Chief Complaint  Patient presents with   Foreign Body in Eye    Chase Reed is a 57 y.o. male.  57 yo M here with right eye pain for about a week. Had a couple days with no significant symptoms. But woke again Sunday morning with eye pain and an opaque appearance to his vision.  Feels similar to when he had a corneal abrasion in the past. Has foreign body sensation but no known injuries or foreign bodies.    Foreign Body in Eye       Home Medications Prior to Admission medications   Medication Sig Start Date End Date Taking? Authorizing Provider  DULoxetine (CYMBALTA) 60 MG capsule Take 1 capsule (60 mg total) by mouth daily. 10/22/21   Shanna Cisco, NP  gabapentin (NEURONTIN) 600 MG tablet Take 600 mg by mouth in the morning, at noon, in the evening, and at bedtime.  01/20/19   [provider]  insulin aspart (NOVOLOG) 100 UNIT/ML injection Inject 2-8 Units into the skin See admin instructions. Take 2-8 units as needed  Sliding scale.over 250--2 units over 300--4 units over 350--6 units over 400--8 units Up to 50 units daily: For diabetes management Patient taking differently: Inject 2-8 Units into the skin See admin instructions. Inject 2-8 units into the skin one to four times a day as needed for elevated BGL, PER SLIDING SCALE- BGL over 250 = 2 units; over 300 = 4 units; over 350 = 6 units; over 400 = 8 units (Up to 50 units daily: For diabetes management) 10/20/21   Glade Lloyd, MD  insulin glargine (LANTUS) 100 UNIT/ML injection Inject 25-30 Units into the skin daily before breakfast.    [provider]  JANUVIA 100 MG tablet Take 100 mg by mouth daily. 05/07/21   [provider]  magnesium oxide (MAG-OX) 400 MG tablet Take 800 mg by mouth daily. 06/30/22   [provider]  metFORMIN  (GLUCOPHAGE) 1000 MG tablet Take 1,000 mg by mouth daily with breakfast.    [provider]  naloxone (NARCAN) nasal spray 4 mg/0.1 mL CALL 911 Use ONE spray in one nostril for suspected overdose. May repeat ONE spray in the other nostril after 2 to 3 minutes. Patient not taking: Reported on 12/31/2022    [provider]  ondansetron (ZOFRAN-ODT) 8 MG disintegrating tablet Take 8 mg by mouth every 8 (eight) hours as needed for nausea. 01/13/22   [provider]  oxyCODONE (OXY IR/ROXICODONE) 5 MG immediate release tablet Take 1 tablet (5 mg total) by mouth every 4 (four) hours as needed for moderate pain. Patient not taking: Reported on 12/31/2022 08/27/22   Rodolph Bong, MD  potassium chloride (KLOR-CON) 10 MEQ tablet Take 1 tablet (10 mEq total) by mouth as needed. For severe leg cramps. 12/22/22 03/22/23  Swinyer, Zachary George, NP  risperiDONE (RISPERDAL) 2 MG tablet Take 2 mg by mouth at bedtime. 06/02/22   [provider]  sertraline (ZOLOFT) 100 MG tablet Take 100 mg by mouth daily. 03/11/21   [provider]  traZODone (DESYREL) 100 MG tablet Take 100 mg by mouth at bedtime as needed for sleep.    [provider]      Allergies    Atorvastatin    Review of Systems   Review of Systems  Physical  Exam Updated Vital Signs BP 108/76 (BP Location: Right Arm)   Pulse 61   Temp 98.4 F (36.9 C) (Oral)   Resp 18   Ht 6' (1.829 m)   Wt 84.8 kg   SpO2 99%   BMI 25.36 kg/m  Physical Exam Vitals and nursing note reviewed.  Constitutional:      Appearance: He is well-developed.  HENT:     Head: Normocephalic and atraumatic.  Eyes:     General:        Right eye: No discharge (with mild crusting yellowish discharge).     Extraocular Movements: Extraocular movements intact.     Pupils: Pupils are equal, round, and reactive to light.     Comments: Woods lamp shows a small defect at three oclock position of right pupil, also another small  uptake laterally on the right eye. No foreign body, no obvious abrasion. IOP of 7.    Cardiovascular:     Rate and Rhythm: Normal rate.  Pulmonary:     Effort: Pulmonary effort is normal. No respiratory distress.  Abdominal:     General: There is no distension.  Musculoskeletal:        General: Normal range of motion.     Cervical back: Normal range of motion.  Neurological:     Mental Status: He is alert.     ED Results / Procedures / Treatments   Labs (all labs ordered are listed, but only abnormal results are displayed) Labs Reviewed - No data to display  EKG None  Radiology No results found.  Procedures Procedures    Medications Ordered in ED Medications  tetracaine (PONTOCAINE) 0.5 % ophthalmic solution 2 drop (2 drops Right Eye Given 04/20/23 0343)  fluorescein ophthalmic strip 1 strip (1 strip Right Eye Given 04/20/23 0343)  erythromycin ophthalmic ointment 1 Application (1 Application Right Eye Given 04/20/23 0404)    ED Course/ Medical Decision Making/ A&P                             Medical Decision Making Risk Prescription drug management.   Atypical presentation for corneal abrasion but seems he may have one. No e/o cataract or glaucoma. Erythromycin ointment ordered. D/w Dr. Allena Katz with ophtho who will see him today, just needs to call for appointment today.   Final Clinical Impression(s) / ED Diagnoses Final diagnoses:  Acute right eye pain    Rx / DC Orders ED Discharge Orders     None         Krishna Dancel, Barbara Cower, MD 04/20/23 [phone]

## 2023-05-05 ENCOUNTER — Other Ambulatory Visit: Payer: Self-pay

## 2023-05-05 MED ORDER — POTASSIUM CHLORIDE ER 10 MEQ PO TBCR: 10.0000 meq | EXTENDED_RELEASE_TABLET | ORAL | 3 refills | Status: DC | PRN

## 2023-05-05 NOTE — Telephone Encounter (Signed)
This is a [facility] pt, Dr. Cristal Deer

## 2023-05-05 NOTE — Telephone Encounter (Signed)
Rx request sent to pharmacy.  

## 2023-06-30 ENCOUNTER — Ambulatory Visit (HOSPITAL_COMMUNITY)
Admission: EM | Admit: 2023-06-30 | Discharge: 2023-06-30 | Disposition: A | Discharge status: Home / Self Care | Payer: MEDICAID

## 2023-06-30 NOTE — Progress Notes (Signed)
   06/30/23 1842  [facility] Triage Screening (Walk-ins at [facility] only)  How Did You Hear About [place]? Self  What Is the Reason for Your Visit/Call Today? Pt arrived to [facility] voluntarily unaccompanied by anyone. Pt states that he has been suicidal since Saturday. 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 Pt states that he is still high off the meth and hasn't been to sleep. Pt states that he needs to detox for his opiod addiction. Pt states he will have d/t and seizures if he is not medically supervised when he is going through withdrawals. Pt endorses SI and using drugs (fentanyl - 2 tenths of a gram) a few hours ago. Pt denies HI, AVH and use of alcohol at this current time.  How Long Has This Been Causing You Problems? > than 6 months  Have You Recently Had Any Thoughts About Hurting Yourself? Yes  How long ago did you have thoughts about hurting yourself? past 3 days  Are You Planning to Commit Suicide/Harm Yourself At This time? No  Have you Recently Had Thoughts About Hurting Someone Karolee Ohs? No  Are You Planning To Harm Someone At This Time? No  Are you currently experiencing any auditory, visual or other hallucinations? No  Have You Used Any Alcohol or Drugs in the Past 24 Hours? Yes  How long ago did you use Drugs or Alcohol? few hours  What Did You Use and How Much? fentanyl (2 tenths of a gram)  Do you have any current medical co-morbidities that require immediate attention? No  Clinician description of patient physical appearance/behavior: poor eye contact, tapping  What Do You Feel Would Help You the Most Today? Alcohol or Drug Use Treatment;Medication(s)  If access to [facility] was not available, would you have sought care in the Emergency Department? Yes  Determination of Need Urgent (48 hours)  Options For Referral Medication Management;Chemical Dependency Intensive Outpatient Therapy (CDIOP)

## 2023-09-15 ENCOUNTER — Other Ambulatory Visit: Payer: Self-pay

## 2023-09-15 MED ORDER — POTASSIUM CHLORIDE ER 10 MEQ PO TBCR: 10.0000 meq | EXTENDED_RELEASE_TABLET | ORAL | 1 refills | Status: DC | PRN

## 2023-12-01 ENCOUNTER — Other Ambulatory Visit: Payer: Self-pay

## 2023-12-01 MED ORDER — POTASSIUM CHLORIDE ER 10 MEQ PO TBCR: 10.0000 meq | EXTENDED_RELEASE_TABLET | ORAL | 3 refills | Status: DC | PRN

## 2024-01-28 ENCOUNTER — Telehealth: Payer: Self-pay | Admitting: Cardiology

## 2024-01-28 NOTE — Telephone Encounter (Signed)
   Pt c/o of Chest Pain: STAT if active CP, including tightness, pressure, jaw pain, radiating pain to shoulder/upper arm/back, CP unrelieved by Nitro. Symptoms reported of SOB, nausea, vomiting, sweating.  1. Are you having CP right now? No only with exertion    2. Are you experiencing any other symptoms (ex. SOB, nausea, vomiting, sweating)? SOB with exertion, fatigue    3. Is your CP continuous or coming and going? Coming and going    4. Have you taken Nitroglycerin? No    5. How long have you been experiencing CP?  3 weeks    6. If NO CP at time of call then end call with telling Pt to call back or call 911 if Chest pain returns prior to return call from triage team.

## 2024-01-28 NOTE — Telephone Encounter (Signed)
 Called and spoke to patient. DOB verified.  Nurse's Recommendations: Spoke to Dr. Cristal Deer of pt's symptoms + phone note. MD recommended a f/u appt to be scheduled. Pt is scheduled to see Dr. Cristal Deer on 4/7 @ 10:00. Advised pt to go to ED if symptoms and CP become worse before appt.  Patient verbalized understanding.

## 2024-01-28 NOTE — Telephone Encounter (Signed)
 Called and spoke to patient. DOB verified.  Patient's concerns: For the past 2-3 weeks, pt has been SOB w/ exertion and sometimes feels a stabbing, stinging pain starting from his collarbone down his chest, down BUE and feels numbness in arms. Pt does have neuropathy. Pt states he can walk 30 mins sometimes and feels fine but then other times he will need to sit down to stop himself from fainting. Pt states this happens multiple times a day but SOB varies with level of exertion. Pt denies syncope but says, "it has almost happened before!" Dennie Bible says he is hydrated. Feels his HR going fast in his chest.  Nurse's Recommendations: Will relay information to Dr. Cristal Deer for review. Possible f/u appointment to be scheduled?  Patient verbalized understanding.

## 2024-01-28 NOTE — Telephone Encounter (Signed)
 Disregard previous appt noted - pt is scheduled 4/9 @ 10:00 am.

## 2024-02-01 ENCOUNTER — Inpatient Hospital Stay (HOSPITAL_BASED_OUTPATIENT_CLINIC_OR_DEPARTMENT_OTHER)
Admission: EM | Admit: 2024-02-01 | Discharge: 2024-02-08 | DRG: 809 | Disposition: A | Discharge status: Home / Self Care | Payer: MEDICAID | Attending: Internal Medicine | Admitting: Internal Medicine

## 2024-02-01 ENCOUNTER — Other Ambulatory Visit: Payer: Self-pay

## 2024-02-01 ENCOUNTER — Emergency Department (HOSPITAL_BASED_OUTPATIENT_CLINIC_OR_DEPARTMENT_OTHER): Payer: MEDICAID | Admitting: Radiology

## 2024-02-01 ENCOUNTER — Encounter (HOSPITAL_BASED_OUTPATIENT_CLINIC_OR_DEPARTMENT_OTHER): Payer: Self-pay | Admitting: Emergency Medicine

## 2024-02-01 DIAGNOSIS — D6959 Other secondary thrombocytopenia: Secondary | ICD-10-CM | POA: Diagnosis present

## 2024-02-01 DIAGNOSIS — D61818 Other pancytopenia: Secondary | ICD-10-CM | POA: Diagnosis not present

## 2024-02-01 DIAGNOSIS — Z825 Family history of asthma and other chronic lower respiratory diseases: Secondary | ICD-10-CM

## 2024-02-01 DIAGNOSIS — D509 Iron deficiency anemia, unspecified: Secondary | ICD-10-CM | POA: Diagnosis present

## 2024-02-01 DIAGNOSIS — B182 Chronic viral hepatitis C: Secondary | ICD-10-CM | POA: Diagnosis present

## 2024-02-01 DIAGNOSIS — D649 Anemia, unspecified: Secondary | ICD-10-CM | POA: Diagnosis not present

## 2024-02-01 DIAGNOSIS — D519 Vitamin B12 deficiency anemia, unspecified: Secondary | ICD-10-CM

## 2024-02-01 DIAGNOSIS — F32A Depression, unspecified: Secondary | ICD-10-CM | POA: Diagnosis present

## 2024-02-01 DIAGNOSIS — R002 Palpitations: Secondary | ICD-10-CM

## 2024-02-01 DIAGNOSIS — F419 Anxiety disorder, unspecified: Secondary | ICD-10-CM | POA: Diagnosis present

## 2024-02-01 DIAGNOSIS — Z794 Long term (current) use of insulin: Secondary | ICD-10-CM

## 2024-02-01 DIAGNOSIS — K7460 Unspecified cirrhosis of liver: Secondary | ICD-10-CM | POA: Diagnosis present

## 2024-02-01 DIAGNOSIS — K766 Portal hypertension: Secondary | ICD-10-CM | POA: Diagnosis present

## 2024-02-01 DIAGNOSIS — F17210 Nicotine dependence, cigarettes, uncomplicated: Secondary | ICD-10-CM | POA: Diagnosis present

## 2024-02-01 DIAGNOSIS — R072 Precordial pain: Secondary | ICD-10-CM

## 2024-02-01 DIAGNOSIS — Z79899 Other long term (current) drug therapy: Secondary | ICD-10-CM

## 2024-02-01 DIAGNOSIS — E1142 Type 2 diabetes mellitus with diabetic polyneuropathy: Secondary | ICD-10-CM | POA: Diagnosis not present

## 2024-02-01 DIAGNOSIS — N179 Acute kidney failure, unspecified: Secondary | ICD-10-CM | POA: Diagnosis present

## 2024-02-01 DIAGNOSIS — Z7984 Long term (current) use of oral hypoglycemic drugs: Secondary | ICD-10-CM

## 2024-02-01 DIAGNOSIS — Z5982 Transportation insecurity: Secondary | ICD-10-CM

## 2024-02-01 DIAGNOSIS — Z888 Allergy status to other drugs, medicaments and biological substances status: Secondary | ICD-10-CM

## 2024-02-01 LAB — MAGNESIUM: Magnesium: 1.8 mg/dL (ref 1.7–2.4)

## 2024-02-01 LAB — RETICULOCYTES
Immature Retic Fract: 18.6 % — ABNORMAL HIGH (ref 2.3–15.9)
RBC.: 2.13 MIL/uL — ABNORMAL LOW (ref 4.22–5.81)
Retic Count, Absolute: 66.2 10*3/uL (ref 19.0–186.0)
Retic Ct Pct: 3.1 % (ref 0.4–3.1)

## 2024-02-01 LAB — COMPREHENSIVE METABOLIC PANEL WITH GFR
ALT: 24 U/L (ref 0–44)
AST: 33 U/L (ref 15–41)
Albumin: 4.7 g/dL (ref 3.5–5.0)
Alkaline Phosphatase: 97 U/L (ref 38–126)
Anion gap: 9 (ref 5–15)
BUN: 22 mg/dL — ABNORMAL HIGH (ref 6–20)
CO2: 22 mmol/L (ref 22–32)
Calcium: 8.8 mg/dL — ABNORMAL LOW (ref 8.9–10.3)
Chloride: 107 mmol/L (ref 98–111)
Creatinine, Ser: 1.28 mg/dL — ABNORMAL HIGH (ref 0.61–1.24)
GFR, Estimated: >60 mL/min (ref >60)
Glucose, Bld: 145 mg/dL — ABNORMAL HIGH (ref 70–99)
Potassium: 4.1 mmol/L (ref 3.5–5.1)
Sodium: 138 mmol/L (ref 135–145)
Total Bilirubin: 0.5 mg/dL (ref 0.0–1.2)
Total Protein: 8 g/dL (ref 6.5–8.1)

## 2024-02-01 LAB — TROPONIN I (HIGH SENSITIVITY)
Troponin I (High Sensitivity): 12 ng/L (ref <18)
Troponin I (High Sensitivity): 13 ng/L (ref <18)

## 2024-02-01 LAB — CBC
HCT: 18.9 % — ABNORMAL LOW (ref 39.0–52.0)
Hemoglobin: 5.1 g/dL — CL (ref 13.0–17.0)
MCH: 22.8 pg — ABNORMAL LOW (ref 26.0–34.0)
MCHC: 27 g/dL — ABNORMAL LOW (ref 30.0–36.0)
MCV: 84.4 fL (ref 80.0–100.0)
Platelets: 27 10*3/uL — CL (ref 150–400)
RBC: 2.24 MIL/uL — ABNORMAL LOW (ref 4.22–5.81)
RDW: 18.9 % — ABNORMAL HIGH (ref 11.5–15.5)
WBC: 1.8 10*3/uL — ABNORMAL LOW (ref 4.0–10.5)
nRBC: 0 % (ref 0.0–0.2)

## 2024-02-01 LAB — IRON AND TIBC
Iron: 26 ug/dL — ABNORMAL LOW (ref 45–182)
Saturation Ratios: 4 % — ABNORMAL LOW (ref 17.9–39.5)
TIBC: 592 ug/dL — ABNORMAL HIGH (ref 250–450)
UIBC: 566 ug/dL

## 2024-02-01 LAB — GLUCOSE, CAPILLARY: Glucose-Capillary: 163 mg/dL — ABNORMAL HIGH (ref 70–99)

## 2024-02-01 LAB — PREPARE RBC (CROSSMATCH)

## 2024-02-01 LAB — OCCULT BLOOD X 1 CARD TO LAB, STOOL: Fecal Occult Bld: NEGATIVE

## 2024-02-01 LAB — VITAMIN B12: Vitamin B-12: 269 pg/mL (ref 180–914)

## 2024-02-01 LAB — FOLATE: Folate: 11.1 ng/mL (ref >5.9)

## 2024-02-01 LAB — FERRITIN: Ferritin: 5 ng/mL — ABNORMAL LOW (ref 24–336)

## 2024-02-01 MED ORDER — SENNOSIDES-DOCUSATE SODIUM 8.6-50 MG PO TABS: 1.0000 | ORAL_TABLET | Freq: Every evening | ORAL | Status: DC | PRN

## 2024-02-01 MED ORDER — SODIUM CHLORIDE 0.9% FLUSH
3.0000 mL | Freq: Two times a day (BID) | INTRAVENOUS | Status: DC
  Administered 2024-02-01 – 2024-02-08 (×12): 3 mL via INTRAVENOUS

## 2024-02-01 MED ORDER — ONDANSETRON HCL 4 MG/2ML IJ SOLN: 4.0000 mg | Freq: Four times a day (QID) | INTRAMUSCULAR | Status: DC | PRN

## 2024-02-01 MED ORDER — SODIUM CHLORIDE 0.9% IV SOLUTION: Freq: Once | INTRAVENOUS | Status: AC

## 2024-02-01 MED ORDER — INSULIN ASPART 100 UNIT/ML IJ SOLN
0.0000 [IU] | Freq: Every day | INTRAMUSCULAR | Status: DC
  Administered 2024-02-03: 2 [IU] via SUBCUTANEOUS
  Administered 2024-02-05: 3 [IU] via SUBCUTANEOUS
  Administered 2024-02-07: 2 [IU] via SUBCUTANEOUS

## 2024-02-01 MED ORDER — INSULIN ASPART 100 UNIT/ML IJ SOLN
0.0000 [IU] | Freq: Three times a day (TID) | INTRAMUSCULAR | Status: DC
  Administered 2024-02-02 – 2024-02-05 (×2): 2 [IU] via SUBCUTANEOUS
  Administered 2024-02-06 – 2024-02-07 (×2): 3 [IU] via SUBCUTANEOUS
  Administered 2024-02-07: 1 [IU] via SUBCUTANEOUS

## 2024-02-01 MED ORDER — ONDANSETRON HCL 4 MG PO TABS: 4.0000 mg | ORAL_TABLET | Freq: Four times a day (QID) | ORAL | Status: DC | PRN

## 2024-02-01 NOTE — ED Triage Notes (Signed)
 Chest pain and sob Arms achy Started a few weeks ago,  Sob intermittant, worse after activity

## 2024-02-01 NOTE — Progress Notes (Signed)
 Called from [facility] regarding anemia in setting of cirrhosis. No overt bleeding accepted to transfer to [facility] long for blood transfusion and hemoglobin trend. Patient will likely need further work up of anemia.

## 2024-02-01 NOTE — ED Provider Triage Note (Signed)
 Emergency Medicine Provider Triage Evaluation Note  Chase Reed , a 58 y.o. male  was evaluated in triage.  Pt complains of sob and cp. On and off x 2 weks. Sometimes worse with exertion. Feels like he cannot get  a full breath.. No nausea, vom or diaphoresis. "Feels like a million bees stinging my chest." + smoking, DM  Review of Systems  Positive: cp Negative: fever  Physical Exam  There were no vitals taken for this visit. Gen:   Awake, no distress   Resp:  Normal effort  MSK:   Moves extremities without difficulty  Other:  RRR  Medical Decision Making  Medically screening exam initiated at 5:11 PM.  Appropriate orders placed.  JARQUAVIOUS FENTRESS was informed that the remainder of the evaluation will be completed by another provider, this initial triage assessment does not replace that evaluation, and the importance of remaining in the ED until their evaluation is complete.     Arthor Captain, PA-C 02/01/24 1714

## 2024-02-01 NOTE — H&P (Signed)
 History and Physical    Chase Reed WJX:[id number] DOB: 1966-09-23 DOA: 02/01/2024  PCP: Courtney Heys, PA-C  Patient coming from: Home  I have personally briefly reviewed patient's old medical records in [facility]  Chief Complaint: Fatigue, exertional dyspnea, chest pain and palpitations  HPI: Chase Reed is a 58 y.o. male with medical history significant for chronic hepatitis C with cirrhosis, chronic pancytopenia, type 2 diabetes, substance use disorder, depression/anxiety who presented to the ED for evaluation of chest pain and palpitations.  Patient reports over the last 3 weeks he has had exertional dyspnea, chest discomfort, and palpitations.  He has been fatigued.  He was concerned that this was cardiac related and made an appointment with his cardiologist but was also told that she does symptoms persist or worsen that he should present to the ED for further evaluation.  Patient states that he has not seen any obvious bleeding.  Denies epistaxis, hemoptysis, hematemesis, hematuria, hematochezia, or melena.  He does not take any blood thinners and avoids NSAIDs.  He has noticed some swelling to his lower extremities.  [facility] ED Course  Labs/Imaging on admission: I have personally reviewed following labs and imaging studies.  Initial vitals showed BP 136/83, pulse 93, RR 20, temp 98.6 F, SpO2 100% on room air.  Labs showed WBC 1.8, hemoglobin 5.1, platelets 27,000, sodium 138, potassium 4.1, bicarb 22, BUN 22, creatinine 1.28, serum glucose 145, LFTs within normal limits, troponin 12, magnesium 1.8.  Ferritin 5.  Iron, B12, folate in process.  FOBT is negative.  2 view chest x-ray showed increased central interstitial markings.  No focal consolidation, effusion, pneumothorax.  The hospitalist service was consulted to admit.  Review of Systems: All systems reviewed and are negative except as documented in history of present illness  above.   Past Medical History:  Diagnosis Date   Chronic idiopathic thrombocytopenia (HCC)    Chronic neutropenia (HCC)    Cirrhosis (HCC)    DM (diabetes mellitus) (HCC)    Hepatitis     Past Surgical History:  Procedure Laterality Date   APPLICATION OF WOUND VAC N/A 12/24/2018   Procedure: APPLICATION OF WOUND VAC;  Surgeon: Alleen Borne, MD;  Location: [facility] OR;  Service: Thoracic;  Laterality: N/A;   APPLICATION OF WOUND VAC N/A 12/27/2018   Procedure: WOUND VAC CHANGE;  Surgeon: Alleen Borne, MD;  Location: [facility] OR;  Service: Thoracic;  Laterality: N/A;   APPLICATION OF WOUND VAC N/A 01/14/2019   Procedure: APPLICATION OF WOUND VAC;  Surgeon: Alleen Borne, MD;  Location: [facility] OR;  Service: Vascular;  Laterality: N/A;   I & D EXTREMITY N/A 01/14/2019   Procedure: DEBRIDEMENT OF STERNOCLAVICULAR JOINT;  Surgeon: Alleen Borne, MD;  Location: [facility] OR;  Service: Vascular;  Laterality: N/A;   I & D EXTREMITY Right 10/03/2020   Procedure: Irrigation debridement of RIGHT forearm subfascial abscess measuring approximately 6 x 3 x 1.5 cm;  Surgeon: Ernest Mallick, MD;  Location: [facility] OR;  Service: Orthopedics;  Laterality: Right;   LITHOTRIPSY     STERNAL WOUND DEBRIDEMENT N/A 12/24/2018   Procedure: I&D CHEST WALL ABSCESS;  Surgeon: Alleen Borne, MD;  Location: [facility] OR;  Service: Thoracic;  Laterality: N/A;   STERNAL WOUND DEBRIDEMENT N/A 12/27/2018   Procedure: DEBRIDEMENT OF CHEST WALL WOUND;  Surgeon: Alleen Borne, MD;  Location: [facility] OR;  Service: Thoracic;  Laterality: N/A;   TEE WITHOUT CARDIOVERSION N/A 01/03/2019  Procedure: TRANSESOPHAGEAL ECHOCARDIOGRAM (TEE);  Surgeon: Thurmon Fair, MD;  Location: [facility] ENDOSCOPY;  Service: Cardiovascular;  Laterality: N/A;    Social History: Patient reports smoking 0-1 cigarettes/day.  He states that he has not had any alcohol or recreational drugs for almost 9 months now.  Allergies  Allergen Reactions   Atorvastatin Other (See  Comments)    Leg cramps and body aches    Family History  Problem Relation Age of Onset   COPD Father      Prior to Admission medications   Medication Sig Start Date End Date Taking? Authorizing Provider  DULoxetine (CYMBALTA) 60 MG capsule Take 1 capsule (60 mg total) by mouth daily. 10/22/21   Shanna Cisco, NP  gabapentin (NEURONTIN) 600 MG tablet Take 600 mg by mouth in the morning, at noon, in the evening, and at bedtime.  01/20/19   [provider]  insulin aspart (NOVOLOG) 100 UNIT/ML injection Inject 2-8 Units into the skin See admin instructions. Take 2-8 units as needed  Sliding scale.over 250--2 units over 300--4 units over 350--6 units over 400--8 units Up to 50 units daily: For diabetes management Patient taking differently: Inject 2-8 Units into the skin See admin instructions. Inject 2-8 units into the skin one to four times a day as needed for elevated BGL, PER SLIDING SCALE- BGL over 250 = 2 units; over 300 = 4 units; over 350 = 6 units; over 400 = 8 units (Up to 50 units daily: For diabetes management) 10/20/21   Glade Lloyd, MD  insulin glargine (LANTUS) 100 UNIT/ML injection Inject 25-30 Units into the skin daily before breakfast.    [provider]  JANUVIA 100 MG tablet Take 100 mg by mouth daily. 05/07/21   [provider]  magnesium oxide (MAG-OX) 400 MG tablet Take 800 mg by mouth daily. 06/30/22   [provider]  metFORMIN (GLUCOPHAGE) 1000 MG tablet Take 1,000 mg by mouth daily with breakfast.    [provider]  naloxone (NARCAN) nasal spray 4 mg/0.1 mL CALL 911 Use ONE spray in one nostril for suspected overdose. May repeat ONE spray in the other nostril after 2 to 3 minutes. Patient not taking: Reported on 12/31/2022    [provider]  ondansetron (ZOFRAN-ODT) 8 MG disintegrating tablet Take 8 mg by mouth every 8 (eight) hours as needed for nausea. 01/13/22   [provider]  oxyCODONE (OXY  IR/ROXICODONE) 5 MG immediate release tablet Take 1 tablet (5 mg total) by mouth every 4 (four) hours as needed for moderate pain. Patient not taking: Reported on 12/31/2022 08/27/22   Rodolph Bong, MD  potassium chloride (KLOR-CON) 10 MEQ tablet Take 1 tablet (10 mEq total) by mouth as needed. For severe leg cramps. 12/01/23   Jodelle Red, MD  risperiDONE (RISPERDAL) 2 MG tablet Take 2 mg by mouth at bedtime. 06/02/22   [provider]  sertraline (ZOLOFT) 100 MG tablet Take 100 mg by mouth daily. 03/11/21   [provider]  traZODone (DESYREL) 100 MG tablet Take 100 mg by mouth at bedtime as needed for sleep.    [provider]    Physical Exam: Vitals:   02/01/24 1900 02/01/24 1930 02/01/24 1955 02/01/24 2040  BP: 125/79 (!) 107/59  123/76  Pulse: 86 85  79  Resp:  19  18  Temp:   98.3 F (36.8 C) 98.2 F (36.8 C)  TempSrc:   Oral   SpO2: 100% 100%  100%  Weight:  97.5 kg  Height:    6' (1.829 m)   Constitutional: Resting in bed, NAD, calm, comfortable Eyes: EOMI, lids and conjunctivae normal ENMT: Mucous membranes are moist. Posterior pharynx clear of any exudate or lesions.Normal dentition.  Neck: normal, supple, no masses. Respiratory: clear to auscultation bilaterally, no wheezing, no crackles. Normal respiratory effort. No accessory muscle use.  Cardiovascular: Regular rate and rhythm, no murmurs / rubs / gallops.  Trace bilateral lower extremity edema. 2+ pedal pulses. Abdomen: no tenderness, no masses palpated. Musculoskeletal: no clubbing / cyanosis. No joint deformity upper and lower extremities. Good ROM, no contractures. Normal muscle tone.  Skin: Slightly jaundiced appearance Neurologic: Sensation intact. Strength 5/5 in all 4.  Psychiatric: Normal judgment and insight. Alert and oriented x 3. Normal mood.   EKG: Personally reviewed. Sinus rhythm, rate 97, no acute ischemic changes.  Assessment/Plan Principal Problem:    Symptomatic anemia Active Problems:   Chronic hepatitis C with cirrhosis (HCC)   Pancytopenia (HCC)   Type 2 diabetes mellitus with diabetic polyneuropathy, with long-term current use of insulin (HCC)   LENNON BOUTWELL is a 58 y.o. male with medical history significant for chronic hepatitis C with cirrhosis, chronic pancytopenia, type 2 diabetes, substance use disorder, depression/anxiety who is admitted with symptomatic acute on chronic anemia.  Assessment and Plan: Symptomatic acute on chronic anemia: Hemoglobin 5.1 on admission compared to previous 8.91-year ago.  Patient denies any obvious bleeding.  Ferritin is 5.  FOBT negative. -Type and screen -Transfuse 2 unit PRBCs -Consider iron transfusion while in hospital -Follow B12, folate, iron/TIBC  Chronic thrombocytopenia: In setting of cirrhosis without obvious bleeding.  Continue to monitor.  Chronic neutropenia: Chronic and relatively stable.  Afebrile.  Check CBC with differential in AM.  Chronic hepatitis C with cirrhosis: Pancytopenia likely related to cirrhosis otherwise no evidence of decompensation.  Mentating well on admission.  Type 2 diabetes: Placed on SSI.  Depression/anxiety: Patient states he is not currently taking any medications for mood.  Med rec is pending.  History of substance use disorder: Patient reports that he is sober from alcohol and drugs for about 9 months now.   DVT prophylaxis: SCDs Start: 02/01/24 2123 Code Status: Full code, confirmed with patient on admission Family Communication: Discussed with patient, he has discussed with family Disposition Plan: From home and likely discharge to home pending clinical progress Consults called: None Severity of Illness: The appropriate patient status for this patient is OBSERVATION. Observation status is judged to be reasonable and necessary in order to provide the required intensity of service to ensure the patient's safety. The patient's presenting  symptoms, physical exam findings, and initial radiographic and laboratory data in the context of their medical condition is felt to place them at decreased risk for further clinical deterioration. Furthermore, it is anticipated that the patient will be medically stable for discharge from the hospital within 2 midnights of admission.   Darreld Mclean MD [facility]  If 7PM-7AM, please contact night-coverage [email or web address]  02/01/2024, 10:53 PM

## 2024-02-01 NOTE — ED Provider Notes (Signed)
 [place] [facility] AT [facility] Provider Note   CSN: [id number] Arrival date & time: 02/01/24  1702     History  Chief Complaint  Patient presents with   Chest Pain    Chase Reed is a 58 y.o. male.  Patient with hx of type II diabetes, chronic hepatitis C with cirrhosis, pancytopenia thought due to cirrhosis-- presents to the emergency department today for evaluation of chest pain and palpitations.  Over the past 3 weeks patient has had increased palpitations.  He describes abrupt onset of a pounding heart that lasts for several minutes and then stops.  When these develop, he develops aching pain across his chest, aching on the inside of the upper arms (like he threw out his arm from throwing a baseball) and numbness of the hands along the ulnar aspect.  He has had near syncope but no full syncope with these events.  They have been occurring multiple times a day.  He has contacted his cardiologist and has an upcoming appointment.  Family convinced him to come to the emergency department for evaluation today.  Patient states that he feels scared when the symptoms occur.  No triggers.  They can occur with activity and at rest.  Hyperdynamic systolic function on echo in 2020.  Patient denies any current bleeding symptoms including black or bloody stools, easy bruising or bleeding of the skin, blood in the urine.  She does report intense itching of the lower legs recently.       Home Medications Prior to Admission medications   Medication Sig Start Date End Date Taking? Authorizing Provider  DULoxetine (CYMBALTA) 60 MG capsule Take 1 capsule (60 mg total) by mouth daily. 10/22/21   Shanna Cisco, NP  gabapentin (NEURONTIN) 600 MG tablet Take 600 mg by mouth in the morning, at noon, in the evening, and at bedtime.  01/20/19   [provider]  insulin aspart (NOVOLOG) 100 UNIT/ML injection Inject 2-8 Units into the skin See admin instructions. Take  2-8 units as needed  Sliding scale.over 250--2 units over 300--4 units over 350--6 units over 400--8 units Up to 50 units daily: For diabetes management Patient taking differently: Inject 2-8 Units into the skin See admin instructions. Inject 2-8 units into the skin one to four times a day as needed for elevated BGL, PER SLIDING SCALE- BGL over 250 = 2 units; over 300 = 4 units; over 350 = 6 units; over 400 = 8 units (Up to 50 units daily: For diabetes management) 10/20/21   Glade Lloyd, MD  insulin glargine (LANTUS) 100 UNIT/ML injection Inject 25-30 Units into the skin daily before breakfast.    [provider]  JANUVIA 100 MG tablet Take 100 mg by mouth daily. 05/07/21   [provider]  magnesium oxide (MAG-OX) 400 MG tablet Take 800 mg by mouth daily. 06/30/22   [provider]  metFORMIN (GLUCOPHAGE) 1000 MG tablet Take 1,000 mg by mouth daily with breakfast.    [provider]  naloxone (NARCAN) nasal spray 4 mg/0.1 mL CALL 911 Use ONE spray in one nostril for suspected overdose. May repeat ONE spray in the other nostril after 2 to 3 minutes. Patient not taking: Reported on 12/31/2022    [provider]  ondansetron (ZOFRAN-ODT) 8 MG disintegrating tablet Take 8 mg by mouth every 8 (eight) hours as needed for nausea. 01/13/22   [provider]  oxyCODONE (OXY IR/ROXICODONE) 5 MG immediate release tablet Take 1 tablet (  5 mg total) by mouth every 4 (four) hours as needed for moderate pain. Patient not taking: Reported on 12/31/2022 08/27/22   Rodolph Bong, MD  potassium chloride (KLOR-CON) 10 MEQ tablet Take 1 tablet (10 mEq total) by mouth as needed. For severe leg cramps. 12/01/23   Jodelle Red, MD  risperiDONE (RISPERDAL) 2 MG tablet Take 2 mg by mouth at bedtime. 06/02/22   [provider]  sertraline (ZOLOFT) 100 MG tablet Take 100 mg by mouth daily. 03/11/21   [provider]  traZODone (DESYREL) 100 MG  tablet Take 100 mg by mouth at bedtime as needed for sleep.    [provider]      Allergies    Atorvastatin    Review of Systems   Review of Systems  Physical Exam Updated Vital Signs BP 136/83 (BP Location: Right Arm)   Pulse 87   Temp 98.6 F (37 C)   Resp 20   SpO2 100%   Physical Exam Vitals and nursing note reviewed.  Constitutional:      Appearance: He is well-developed. He is not diaphoretic.  HENT:     Head: Normocephalic and atraumatic.     Mouth/Throat:     Mouth: Mucous membranes are not dry.  Eyes:     Conjunctiva/sclera: Conjunctivae normal.  Neck:     Vascular: Normal carotid pulses. No carotid bruit or JVD.     Trachea: Trachea normal. No tracheal deviation.  Cardiovascular:     Rate and Rhythm: Normal rate and regular rhythm.     Pulses: No decreased pulses.          Radial pulses are 2+ on the right side and 2+ on the left side.     Heart sounds: Normal heart sounds, S1 normal and S2 normal. Heart sounds not distant. No murmur heard. Pulmonary:     Effort: Pulmonary effort is normal. No respiratory distress.     Breath sounds: Normal breath sounds. No wheezing.  Chest:     Chest wall: No tenderness.  Abdominal:     General: Bowel sounds are normal.     Palpations: Abdomen is soft.     Tenderness: There is no abdominal tenderness. There is no guarding or rebound.  Genitourinary:    Rectum: Guaiac result negative. No external hemorrhoid or internal hemorrhoid.     Comments: Scant stool, light brown -- not bloody or melanotic Musculoskeletal:     Cervical back: Normal range of motion and neck supple. No muscular tenderness.     Right lower leg: No edema.     Left lower leg: No edema.  Skin:    General: Skin is warm and dry.     Coloration: Skin is not pale.  Neurological:     Mental Status: He is alert. Mental status is at baseline.  Psychiatric:        Mood and Affect: Mood normal.     ED Results / Procedures / Treatments    Labs (all labs ordered are listed, but only abnormal results are displayed) Labs Reviewed  CBC - Abnormal; Notable for the following components:      Result Value   WBC 1.8 (*)    RBC 2.24 (*)    Hemoglobin 5.1 (*)    HCT 18.9 (*)    MCH 22.8 (*)    MCHC 27.0 (*)    RDW 18.9 (*)    Platelets 27 (*)    All other components within normal limits  COMPREHENSIVE  METABOLIC PANEL WITH GFR - Abnormal; Notable for the following components:   Glucose, Bld 145 (*)    BUN 22 (*)    Creatinine, Ser 1.28 (*)    Calcium 8.8 (*)    All other components within normal limits  MAGNESIUM  VITAMIN B12  FOLATE  IRON AND TIBC  FERRITIN  RETICULOCYTES  OCCULT BLOOD X 1 CARD TO LAB, STOOL  TROPONIN I (HIGH SENSITIVITY)  TROPONIN I (HIGH SENSITIVITY)    EKG EKG Interpretation Date/Time:  Monday February 01 2024 17:12:39 EDT Ventricular Rate:  97 PR Interval:  142 QRS Duration:  86 QT Interval:  376 QTC Calculation: 477 R Axis:   33  Text Interpretation: Normal sinus rhythm Normal ECG Since last tracing rate faster Otherwise no significant change Confirmed by Melene Plan [phone]) on 02/01/2024 5:27:51 PM  Radiology DG Chest 2 View Result Date: 02/01/2024 CLINICAL DATA:  Chest pain EXAM: CHEST - 2 VIEW COMPARISON:  Chest x-ray 10/18/2021 FINDINGS: There are increased central interstitial markings. There is no focal lung infiltrate, pleural effusion or pneumothorax. The cardiomediastinal silhouette is within normal limits. No acute fractures are seen. IMPRESSION: Increased central interstitial markings may reflect bronchitis or atypical pneumonia. Electronically Signed   By: Darliss Cheney M.D.   On: 02/01/2024 17:55    Procedures Procedures    Medications Ordered in ED Medications - No data to display  ED Course/ Medical Decision Making/ A&P    Patient seen and examined. History obtained directly from patient.   Labs/EKG: Ordered CBC, CMP, troponin, magnesium.  EKG personally reviewed and  interpreted as above.  Imaging: Ordered chest x-ray.  Medications/Fluids: None ordered  Most recent vital signs reviewed and are as follows: BP 136/83 (BP Location: Right Arm)   Pulse 87   Temp 98.6 F (37 C)   Resp 20   SpO2 100%   Initial impression: Chest pain, palpitations.  Some concern for intermittent arrhythmia, less concern for acute coronary syndrome.  6:21 PM Reassessment performed. Patient appears stable.  Labs personally reviewed and interpreted including: CBC pancytopenia stable from approximately 1 year ago except for worsening anemia hemoglobin 5.1; CMP creatinine minimally elevated at 1.28, glucose 145; troponin normal at 12.  Imaging personally visualized and interpreted including: Chest x-ray read as increased central interstitial markings reflecting bronchitis or atypical pneumonia, patient does not have symptoms which are typical for pneumonia including cough, fever at this time.  Heart size normal.  Reviewed pertinent lab work and imaging with patient at bedside. Questions answered.   Most current vital signs reviewed and are as follows: BP 136/83 (BP Location: Right Arm)   Pulse 87   Temp 98.6 F (37 C)   Resp 20   SpO2 100%   Plan: Plan admission to the hospital, will need transfusion.  6:36 PM briefly discussed case with on-call hematology, Dr. Cherly Hensen, by telephone.  Agrees with usual anemia workup including excluding GI source of bleeding as he is at risk given his underlying cirrhosis.  Also consider iron studies, B12, folate.  These have been ordered.  Rectal exam performed with RN chaperone.  No gross bloody or melanotic stool.  6:48 PM Spoke with Dr. Avie Arenas with [facility] who accepts for admission.                                 Medical Decision Making Amount and/or Complexity of Data Reviewed Labs: ordered. Radiology: ordered.   Patient  with intermittent chest pain and palpitations.  Found to have new anemia today with hemoglobin 5.1.  Admit  for transfusion and further workup.        Final Clinical Impression(s) / ED Diagnoses Final diagnoses:  Anemia, unspecified type  Pancytopenia (HCC)  Precordial pain  Palpitations    Rx / DC Orders ED Discharge Orders     None         Renne Crigler, PA-C 02/01/24 1848    Melene Plan, DO 02/01/24 1928

## 2024-02-01 NOTE — Hospital Course (Signed)
 Chase Reed is a 58 y.o. male with medical history significant for chronic hepatitis C with cirrhosis, chronic pancytopenia, type 2 diabetes, substance use disorder, depression/anxiety who is admitted with symptomatic acute on chronic anemia.

## 2024-02-02 DIAGNOSIS — B182 Chronic viral hepatitis C: Secondary | ICD-10-CM

## 2024-02-02 DIAGNOSIS — D6959 Other secondary thrombocytopenia: Secondary | ICD-10-CM | POA: Diagnosis present

## 2024-02-02 DIAGNOSIS — K7460 Unspecified cirrhosis of liver: Secondary | ICD-10-CM | POA: Diagnosis present

## 2024-02-02 DIAGNOSIS — R002 Palpitations: Secondary | ICD-10-CM | POA: Diagnosis present

## 2024-02-02 DIAGNOSIS — Z7984 Long term (current) use of oral hypoglycemic drugs: Secondary | ICD-10-CM | POA: Diagnosis not present

## 2024-02-02 DIAGNOSIS — F419 Anxiety disorder, unspecified: Secondary | ICD-10-CM | POA: Diagnosis present

## 2024-02-02 DIAGNOSIS — N179 Acute kidney failure, unspecified: Secondary | ICD-10-CM | POA: Diagnosis present

## 2024-02-02 DIAGNOSIS — Z5982 Transportation insecurity: Secondary | ICD-10-CM | POA: Diagnosis not present

## 2024-02-02 DIAGNOSIS — Z79899 Other long term (current) drug therapy: Secondary | ICD-10-CM | POA: Diagnosis not present

## 2024-02-02 DIAGNOSIS — Z825 Family history of asthma and other chronic lower respiratory diseases: Secondary | ICD-10-CM | POA: Diagnosis not present

## 2024-02-02 DIAGNOSIS — F17210 Nicotine dependence, cigarettes, uncomplicated: Secondary | ICD-10-CM | POA: Diagnosis present

## 2024-02-02 DIAGNOSIS — Z794 Long term (current) use of insulin: Secondary | ICD-10-CM | POA: Diagnosis not present

## 2024-02-02 DIAGNOSIS — D649 Anemia, unspecified: Secondary | ICD-10-CM | POA: Diagnosis present

## 2024-02-02 DIAGNOSIS — Z888 Allergy status to other drugs, medicaments and biological substances status: Secondary | ICD-10-CM | POA: Diagnosis not present

## 2024-02-02 DIAGNOSIS — E1142 Type 2 diabetes mellitus with diabetic polyneuropathy: Secondary | ICD-10-CM | POA: Diagnosis present

## 2024-02-02 DIAGNOSIS — D61818 Other pancytopenia: Secondary | ICD-10-CM | POA: Diagnosis present

## 2024-02-02 DIAGNOSIS — D509 Iron deficiency anemia, unspecified: Secondary | ICD-10-CM | POA: Diagnosis present

## 2024-02-02 DIAGNOSIS — K766 Portal hypertension: Secondary | ICD-10-CM | POA: Diagnosis present

## 2024-02-02 DIAGNOSIS — F32A Depression, unspecified: Secondary | ICD-10-CM | POA: Diagnosis present

## 2024-02-02 LAB — GLUCOSE, CAPILLARY
Glucose-Capillary: 174 mg/dL — ABNORMAL HIGH (ref 70–99)
Glucose-Capillary: 174 mg/dL — ABNORMAL HIGH (ref 70–99)
Glucose-Capillary: 143 mg/dL — ABNORMAL HIGH (ref 70–99)
Glucose-Capillary: 126 mg/dL — ABNORMAL HIGH (ref 70–99)
Glucose-Capillary: 165 mg/dL — ABNORMAL HIGH (ref 70–99)

## 2024-02-02 LAB — COMPREHENSIVE METABOLIC PANEL WITH GFR
ALT: 23 U/L (ref 0–44)
AST: 27 U/L (ref 15–41)
Albumin: 3.5 g/dL (ref 3.5–5.0)
Alkaline Phosphatase: 81 U/L (ref 38–126)
Anion gap: 8 (ref 5–15)
BUN: 17 mg/dL (ref 6–20)
CO2: 20 mmol/L — ABNORMAL LOW (ref 22–32)
Calcium: 8.1 mg/dL — ABNORMAL LOW (ref 8.9–10.3)
Chloride: 106 mmol/L (ref 98–111)
Creatinine, Ser: 1.14 mg/dL (ref 0.61–1.24)
GFR, Estimated: >60 mL/min (ref >60)
Glucose, Bld: 184 mg/dL — ABNORMAL HIGH (ref 70–99)
Potassium: 3.6 mmol/L (ref 3.5–5.1)
Sodium: 134 mmol/L — ABNORMAL LOW (ref 135–145)
Total Bilirubin: 0.5 mg/dL (ref 0.0–1.2)
Total Protein: 6.7 g/dL (ref 6.5–8.1)

## 2024-02-02 LAB — MRSA NEXT GEN BY PCR, NASAL: MRSA by PCR Next Gen: DETECTED — AB

## 2024-02-02 LAB — HEMOGLOBIN AND HEMATOCRIT, BLOOD
HCT: 25 % — ABNORMAL LOW (ref 39.0–52.0)
Hemoglobin: 7.5 g/dL — ABNORMAL LOW (ref 13.0–17.0)

## 2024-02-02 LAB — CBC WITH DIFFERENTIAL/PLATELET
Abs Immature Granulocytes: 0 10*3/uL (ref 0.00–0.07)
Basophils Absolute: 0 10*3/uL (ref 0.0–0.1)
Basophils Relative: 1 %
Eosinophils Absolute: 0.1 10*3/uL (ref 0.0–0.5)
Eosinophils Relative: 8 %
HCT: 21 % — ABNORMAL LOW (ref 39.0–52.0)
Hemoglobin: 6 g/dL — CL (ref 13.0–17.0)
Immature Granulocytes: 0 %
Lymphocytes Relative: 22 %
Lymphs Abs: 0.4 10*3/uL — ABNORMAL LOW (ref 0.7–4.0)
MCH: 24.9 pg — ABNORMAL LOW (ref 26.0–34.0)
MCHC: 28.6 g/dL — ABNORMAL LOW (ref 30.0–36.0)
MCV: 87.1 fL (ref 80.0–100.0)
Monocytes Absolute: 0.2 10*3/uL (ref 0.1–1.0)
Monocytes Relative: 10 %
Neutro Abs: 1 10*3/uL — ABNORMAL LOW (ref 1.7–7.7)
Neutrophils Relative %: 59 %
Platelets: 21 10*3/uL — CL (ref 150–400)
RBC: 2.41 MIL/uL — ABNORMAL LOW (ref 4.22–5.81)
RDW: 17.2 % — ABNORMAL HIGH (ref 11.5–15.5)
WBC: 1.6 10*3/uL — ABNORMAL LOW (ref 4.0–10.5)
nRBC: 0 % (ref 0.0–0.2)

## 2024-02-02 LAB — HEMOGLOBIN A1C
Hgb A1c MFr Bld: 5.4 % (ref 4.8–5.6)
Mean Plasma Glucose: 108.28 mg/dL

## 2024-02-02 LAB — HIV ANTIBODY (ROUTINE TESTING W REFLEX): HIV Screen 4th Generation wRfx: NONREACTIVE

## 2024-02-02 LAB — PREPARE RBC (CROSSMATCH)

## 2024-02-02 MED ORDER — ORAL CARE MOUTH RINSE: 15.0000 mL | OROMUCOSAL | Status: DC | PRN

## 2024-02-02 MED ORDER — SODIUM CHLORIDE 0.9% IV SOLUTION: Freq: Once | INTRAVENOUS | Status: AC

## 2024-02-02 NOTE — Consult Note (Cosign Needed)
 [facility]  Telephone:([phone] [phone]) [phone]    HEMATOLOGY CONSULTATION  PURPOSE OF CONSULTATION/CHIEF COMPLAINT: Pancytopenia   Referring MD: Dr. Sharl Ma   HPI: Chase Reed is a 58 year old male patient who came to the ED on 02/01/2024 with chief complaints of fatigue, dyspnea on exertion, chest pain and palpitations which has been worsening over the last 3 to 4 weeks.  Workup was done in the ED which included blood work that demonstrated pancytopenia.  Therefore hematology evaluation has been requested. Patient is seen, assessed and examined today.  He is awake alert and oriented x 3 and details chief complaints as documented above.  Patient is a good historian and outlines history as detailed. Past medical history includes significant thrombocytopenia and leukopenia over a 20-year timeframe.  He notes that he was seen several years ago by hematologist Dr. Leonides Schanz at [facility] cancer center.  Has been lost to follow-up.  Medical history also inclusive of cirrhosis for over 20 years, hep C which he believes he acquired after a blood transfusion at age 37 after trauma. Surgical history includes cardiothoracic surgery, right upper extremity surgery due to abscess, renal stones. Family history is noncontributory. Social history is significant for opiate use which he quit 9 months ago.  Admits to former alcohol use, quit many years ago.   Admits to tobacco use which he started as a teenager, approximately 1 pack every 2 to 3 weeks, admits to ongoing vaping.  Denies occupational/hazardous exposure.      ASSESSMENT AND PLAN:  Pancytopenia: Anemia - Hemoglobin low 6.0 today.  Hemoglobin 5.1 on admission 02/01/2024. - Baseline hemoglobin appears to be in the 9-10 range. - Recommend PRBC transfusion for Hgb <7.0. - Patient seen in the past by hematology several years ago and has been lost to follow-up. - Patient denies any active bleeding, epistaxis, dark stool  or other GI blood loss.  FOBT negative. - GI evaluation done, EGD possible this admission. - Consideration for bone marrow biopsy. - Hematology/Dr. Mosetta Putt will see patient.  Thrombocytopenia - Platelets low 21K today - Baseline platelets in the mid 30s to 40 range.  Indeed patient reports that his platelets have not been over 40K in 10 years. - Recommend transfuse platelets for counts <10K or <50 K with acute bleeding. - Patient denies active bleeding at this time.  Leukopenia - WBC low 1.6 today - Baseline WBC in the upper 1 range - No transfusional intervention recommended at this time  Fatigue Dyspnea on exertion - Reports 3-week history of worsening fatigue and dyspnea on exertion - Likely due to to anemia - Continue supportive care  History of cirrhosis Hepatitis C -Patient states he was diagnosed with cirrhosis over 20 years ago. -Noted elevated LFTs 1 year ago 08/27/2022. - LFTs currently WNL with total bili WNL - Continue to monitor CMP  Diabetes -Reports he is not on regular diabetic medication or insulin.  However he has been using sliding scale insulin to monitor himself at home. - Continue to monitor blood glucose levels   Past Medical History:  Diagnosis Date   Chronic idiopathic thrombocytopenia (HCC)    Chronic neutropenia (HCC)    Cirrhosis (HCC)    DM (diabetes mellitus) (HCC)    Hepatitis   :  Past Surgical History:  Procedure Laterality Date   APPLICATION OF WOUND VAC N/A 12/24/2018   Procedure: APPLICATION OF WOUND VAC;  Surgeon: Alleen Borne, MD;  Location: [facility] OR;  Service: Thoracic;  Laterality: N/A;  APPLICATION OF WOUND VAC N/A 12/27/2018   Procedure: WOUND VAC CHANGE;  Surgeon: Alleen Borne, MD;  Location: [facility] OR;  Service: Thoracic;  Laterality: N/A;   APPLICATION OF WOUND VAC N/A 01/14/2019   Procedure: APPLICATION OF WOUND VAC;  Surgeon: Alleen Borne, MD;  Location: [facility] OR;  Service: Vascular;  Laterality: N/A;   I & D EXTREMITY N/A  01/14/2019   Procedure: DEBRIDEMENT OF STERNOCLAVICULAR JOINT;  Surgeon: Alleen Borne, MD;  Location: [facility] OR;  Service: Vascular;  Laterality: N/A;   I & D EXTREMITY Right 10/03/2020   Procedure: Irrigation debridement of RIGHT forearm subfascial abscess measuring approximately 6 x 3 x 1.5 cm;  Surgeon: Ernest Mallick, MD;  Location: [facility] OR;  Service: Orthopedics;  Laterality: Right;   LITHOTRIPSY     STERNAL WOUND DEBRIDEMENT N/A 12/24/2018   Procedure: I&D CHEST WALL ABSCESS;  Surgeon: Alleen Borne, MD;  Location: [facility] OR;  Service: Thoracic;  Laterality: N/A;   STERNAL WOUND DEBRIDEMENT N/A 12/27/2018   Procedure: DEBRIDEMENT OF CHEST WALL WOUND;  Surgeon: Alleen Borne, MD;  Location: [facility] OR;  Service: Thoracic;  Laterality: N/A;   TEE WITHOUT CARDIOVERSION N/A 01/03/2019   Procedure: TRANSESOPHAGEAL ECHOCARDIOGRAM (TEE);  Surgeon: Thurmon Fair, MD;  Location: [facility] ENDOSCOPY;  Service: Cardiovascular;  Laterality: N/A;  :  Allergies  Allergen Reactions   Atorvastatin Other (See Comments)    Leg cramps and body aches  :   Family History  Problem Relation Age of Onset   COPD Father   :   Social History   Socioeconomic History   Marital status: Married    Spouse name: Not on file   Number of children: Not on file   Years of education: Not on file   Highest education level: Not on file  Occupational History   Occupation: Building Education administrator  Tobacco Use   Smoking status: Some Days    Current packs/day: 0.25    Average packs/day: 0.3 packs/day for 30.0 years (7.5 ttl pk-yrs)    Types: Cigarettes   Smokeless tobacco: Former  Building services engineer status: Never Used  Substance and Sexual Activity   Alcohol use: No   Drug use: Not Currently    Types: Heroin, IV    Comment: Fentanyl and "Oxy" per patient    Sexual activity: Never  Other Topics Concern   Not on file  Social History Narrative   Not on file   Social Drivers of Health   Financial Resource Strain: Low  Risk  (04/13/2023)   Received from [facility], [facility]   Overall Financial Resource Strain (CARDIA)    Difficulty of Paying Living Expenses: Not hard at all  Food Insecurity: No Food Insecurity (02/01/2024)   Hunger Vital Sign    Worried About Running Out of Food in the Last Year: Never true    Ran Out of Food in the Last Year: Never true  Transportation Needs: Unmet Transportation Needs (02/01/2024)   PRAPARE - Administrator, Civil Service (Medical): Yes    Lack of Transportation (Non-Medical): Yes  Physical Activity: Not on file  Stress: Not on file  Social Connections: Unknown (03/04/2022)   Received from [facility], [facility]   Social Network    Social Network: Not on file  Intimate Partner Violence: Not At Risk (02/01/2024)   Humiliation, Afraid, Rape, and Kick questionnaire    Fear of Current or Ex-Partner: No    Emotionally Abused:  No    Physically Abused: No    Sexually Abused: No  :   CURRENT MEDS: Current Facility-Administered Medications  Medication Dose Route Frequency Provider Last Rate Last Admin   0.9 %  sodium chloride infusion (Manually program via Guardrails IV Fluids)   Intravenous Once [place], Sarina Ill, MD       insulin aspart (novoLOG) injection 0-5 Units  0-5 Units Subcutaneous QHS Allena Katz, Vishal R, MD       insulin aspart (novoLOG) injection 0-9 Units  0-9 Units Subcutaneous TID WC Charlsie Quest, MD   2 Units at 02/02/24 0827   ondansetron (ZOFRAN) tablet 4 mg  4 mg Oral Q6H PRN Charlsie Quest, MD       Or   ondansetron (ZOFRAN) injection 4 mg  4 mg Intravenous Q6H PRN Charlsie Quest, MD       Oral care mouth rinse  15 mL Mouth Rinse PRN Charlsie Quest, MD       senna-docusate (Senokot-S) tablet 1 tablet  1 tablet Oral QHS PRN Charlsie Quest, MD       sodium chloride flush (NS) 0.9 % injection 3 mL  3 mL Intravenous Q12H Charlsie Quest, MD   3 mL at 02/01/24 2253    REVIEW OF SYSTEMS:   Constitutional: + Fatigue, denies fevers,  chills or abnormal night sweats Eyes: Denies blurriness of vision, double vision or watery eyes Ears, nose, mouth, throat, and face: Denies mucositis or sore throat Respiratory: + Dyspnea Cardiovascular: Denies palpitation, chest discomfort or lower extremity swelling Gastrointestinal: Denies nausea, heartburn or change in bowel habits Skin: Denies abnormal skin rashes Lymphatics: Denies new lymphadenopathy or easy bruising Neurological: Denies numbness, tingling or new weaknesses Behavioral/Psych: Mood is stable, no new changes  All other systems were reviewed with the patient and are negative.  PHYSICAL EXAMINATION: ECOG PERFORMANCE STATUS: 1 - Symptomatic but completely ambulatory  Vitals:   02/02/24 0625 02/02/24 1219  BP: 123/76 134/86  Pulse: 73 83  Resp: 18 20  Temp: 98 F (36.7 C) 97.8 F (36.6 C)  SpO2: 99% 100%   Filed Weights   02/01/24 2040  Weight: 215 lb (97.5 kg)    GENERAL: alert, no distress and comfortable SKIN: skin color, texture, turgor are normal, no rashes or significant lesions EYES: normal, conjunctiva are pink and non-injected, sclera clear OROPHARYNX: no exudate, no erythema and lips, buccal mucosa, and tongue normal  NECK: supple, thyroid normal size, non-tender, without nodularity LYMPH: no palpable lymphadenopathy in the cervical, axillary or inguinal LUNGS: clear to auscultation and percussion with normal breathing effort HEART: regular rate & rhythm and no murmurs and no lower extremity edema ABDOMEN: abdomen soft, non-tender and normal bowel sounds MUSCULOSKELETAL: no cyanosis of digits and no clubbing  PSYCH: alert & oriented x 3 with fluent speech NEURO: no focal motor/sensory deficits   LABS: Lab Results  Component Value Date   WBC 1.6 (L) 02/02/2024   HGB 6.0 (LL) 02/02/2024   HCT 21.0 (L) 02/02/2024   MCV 87.1 02/02/2024   PLT 21 (LL) 02/02/2024    Lab Results  Component Value Date   WBC 1.6 (L) 02/02/2024   HGB 6.0 (LL)  02/02/2024   HCT 21.0 (L) 02/02/2024   PLT 21 (LL) 02/02/2024   GLUCOSE 184 (H) 02/02/2024   ALT 23 02/02/2024   AST 27 02/02/2024   NA 134 (L) 02/02/2024   K 3.6 02/02/2024   CL 106 02/02/2024   CREATININE 1.14 02/02/2024  BUN 17 02/02/2024   CO2 20 (L) 02/02/2024   INR 1.0 10/18/2021   HGBA1C 5.4 02/02/2024    DG Chest 2 View Result Date: 02/01/2024 CLINICAL DATA:  Chest pain EXAM: CHEST - 2 VIEW COMPARISON:  Chest x-ray 10/18/2021 FINDINGS: There are increased central interstitial markings. There is no focal lung infiltrate, pleural effusion or pneumothorax. The cardiomediastinal silhouette is within normal limits. No acute fractures are seen. IMPRESSION: Increased central interstitial markings may reflect bronchitis or atypical pneumonia. Electronically Signed   By: Darliss Cheney M.D.   On: 02/01/2024 17:55     The total time spent in the appointment was 40 minutes encounter with patients including review of chart and various tests results, discussions about plan of care and coordination of care plan   All questions were answered. The patient knows to call the clinic with any problems, questions or concerns. No barriers to learning was detected.  Thank you for the courtesy of this consultation, Dawson Bills, NP  4/1/20251:13 PM

## 2024-02-02 NOTE — Plan of Care (Signed)
  Problem: Education: Goal: Knowledge of General Education information will improve Description Including pain rating scale, medication(s)/side effects and non-pharmacologic comfort measures Outcome: Progressing   Problem: Health Behavior/Discharge Planning: Goal: Ability to manage health-related needs will improve Outcome: Progressing   Problem: Clinical Measurements: Goal: Ability to maintain clinical measurements within normal limits will improve Outcome: Progressing Goal: Will remain free from infection Outcome: Progressing Goal: Diagnostic test results will improve Outcome: Progressing Goal: Respiratory complications will improve Outcome: Progressing Goal: Cardiovascular complication will be avoided Outcome: Progressing   Problem: Clinical Measurements: Goal: Ability to maintain clinical measurements within normal limits will improve Outcome: Progressing   Problem: Clinical Measurements: Goal: Will remain free from infection Outcome: Progressing   Problem: Clinical Measurements: Goal: Diagnostic test results will improve Outcome: Progressing   Problem: Clinical Measurements: Goal: Respiratory complications will improve Outcome: Progressing   Problem: Clinical Measurements: Goal: Cardiovascular complication will be avoided Outcome: Progressing

## 2024-02-02 NOTE — TOC Initial Note (Addendum)
 Transition of Care [facility]) - Initial/Assessment Note    Patient Details  Name: DYLANN GALLIER MRN: [id number] Date of Birth: 1966/07/31  Transition of Care [facility]) CM/SW Contact:    Lanier Clam, RN Phone Number: 02/02/2024, 3:58 PM  Clinical Narrative:Has own transport.d/c plan home                   Expected Discharge Plan: Home/Self Care Barriers to Discharge: Continued Medical Work up   Patient Goals and CMS Choice            Expected Discharge Plan and Services                                              Prior Living Arrangements/Services                       Activities of Daily Living   ADL Screening (condition at time of admission) Independently performs ADLs?: Yes (appropriate for developmental age) Is the patient deaf or have difficulty hearing?: No Does the patient have difficulty seeing, even when wearing glasses/contacts?: Yes Does the patient have difficulty concentrating, remembering, or making decisions?: No  Permission Sought/Granted                  Emotional Assessment              Admission diagnosis:  Palpitations [R00.2] Precordial pain [R07.2] Anemia [D64.9] Pancytopenia (HCC) [D61.818] Anemia, unspecified type [D64.9] Patient Active Problem List   Diagnosis Date Noted   Anemia 02/02/2024   Symptomatic anemia 02/01/2024   Intravenous drug abuse (HCC) 08/25/2022   Abscess of finger of right hand 08/25/2022   Tenosynovitis of right hand 08/24/2022   History of hypotension 12/20/2021   History of syncope 12/20/2021   Closed fracture of distal end of right humerus 11/12/2021   Hemoptysis 10/19/2021   Localized swelling of left foot 05/15/2021   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 01/08/2021   MDD (major depressive disorder) 12/18/2020   Nicotine dependence 10/03/2020   Essential hypertension 10/03/2020   Type 2 diabetes mellitus with diabetic polyneuropathy, with long-term current use of insulin (HCC) 10/03/2020    Diabetic peripheral neuropathy (HCC) 06/04/2020   Major depressive disorder, recurrent episode, moderate with anxious distress (HCC) 05/04/2020   Pancytopenia (HCC) 02/03/2019   Chronic hepatitis C with cirrhosis (HCC) 02/03/2019   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 [place] [address] Phone: [phone] Fax: [phone]  [facility] - [facility] [address] [place] [address] Phone: [phone] Fax: [phone]  [id number] - [address] [place] - [address] AT [facility] [address] [place] [address] Phone: [phone] Fax: [phone]     Social Drivers of Health (SDOH) Social History: SDOH Screenings   Food Insecurity: No Food Insecurity (02/01/2024)  Housing: High Risk (02/01/2024)  Transportation Needs: Unmet Transportation Needs (02/01/2024)  Utilities: Not At Risk (02/01/2024)  Alcohol Screen: Low Risk  (09/11/2017)  Depression (PHQ2-9): Low Risk  (  12/31/2022)  Financial Resource Strain: Low Risk  (04/13/2023)   Received from [facility]  Social Connections: Unknown (03/04/2022)   Received from [facility], [facility]  Tobacco Use: High Risk (02/01/2024)   SDOH Interventions:     Readmission Risk Interventions    08/27/2022    2:27 PM  Readmission Risk Prevention Plan  Transportation Screening Complete  PCP or Specialist Appt within 5-7 Days Complete  Home Care Screening Complete  Medication Review (RN CM) Complete

## 2024-02-02 NOTE — Progress Notes (Signed)
 Call received from lab tech with hgb 6.0 and platelets 21. Dr Sharl Ma notified.

## 2024-02-02 NOTE — Consult Note (Signed)
 [facility] Gastroenterology Consultation Note  Referring Provider: No ref. provider found Primary Care Physician:  Johnnette Litter Primary Gastroenterologist:  [facility]  Reason for Consultation:  anemia  HPI: Chase Reed is a 58 y.o. male admitted symptomatic anemia (weakness, shortness of breath).  No abdominal pain, hematemesis, or hematochezia.  Hgb 5 and FOBT negative.  Worsening also of chronic leukopenia and thrombocytopenia.  HEP C cured few years ago.  No alcohol.   Past Medical History:  Diagnosis Date   Chronic idiopathic thrombocytopenia (HCC)    Chronic neutropenia (HCC)    Cirrhosis (HCC)    DM (diabetes mellitus) (HCC)    Hepatitis     Past Surgical History:  Procedure Laterality Date   APPLICATION OF WOUND VAC N/A 12/24/2018   Procedure: APPLICATION OF WOUND VAC;  Surgeon: Alleen Borne, MD;  Location: [facility] OR;  Service: Thoracic;  Laterality: N/A;   APPLICATION OF WOUND VAC N/A 12/27/2018   Procedure: WOUND VAC CHANGE;  Surgeon: Alleen Borne, MD;  Location: [facility] OR;  Service: Thoracic;  Laterality: N/A;   APPLICATION OF WOUND VAC N/A 01/14/2019   Procedure: APPLICATION OF WOUND VAC;  Surgeon: Alleen Borne, MD;  Location: [facility] OR;  Service: Vascular;  Laterality: N/A;   I & D EXTREMITY N/A 01/14/2019   Procedure: DEBRIDEMENT OF STERNOCLAVICULAR JOINT;  Surgeon: Alleen Borne, MD;  Location: [facility] OR;  Service: Vascular;  Laterality: N/A;   I & D EXTREMITY Right 10/03/2020   Procedure: Irrigation debridement of RIGHT forearm subfascial abscess measuring approximately 6 x 3 x 1.5 cm;  Surgeon: Ernest Mallick, MD;  Location: [facility] OR;  Service: Orthopedics;  Laterality: Right;   LITHOTRIPSY     STERNAL WOUND DEBRIDEMENT N/A 12/24/2018   Procedure: I&D CHEST WALL ABSCESS;  Surgeon: Alleen Borne, MD;  Location: [facility] OR;  Service: Thoracic;  Laterality: N/A;   STERNAL WOUND DEBRIDEMENT N/A 12/27/2018   Procedure: DEBRIDEMENT OF CHEST WALL  WOUND;  Surgeon: Alleen Borne, MD;  Location: [facility] OR;  Service: Thoracic;  Laterality: N/A;   TEE WITHOUT CARDIOVERSION N/A 01/03/2019   Procedure: TRANSESOPHAGEAL ECHOCARDIOGRAM (TEE);  Surgeon: Thurmon Fair, MD;  Location: [facility] ENDOSCOPY;  Service: Cardiovascular;  Laterality: N/A;    Prior to Admission medications   Medication Sig Start Date End Date Taking? Authorizing Provider  gabapentin (NEURONTIN) 600 MG tablet Take 600 mg by mouth 4 (four) times daily. 01/20/19  Yes [provider]  insulin aspart (NOVOLOG) 100 UNIT/ML injection Inject 2-8 Units into the skin See admin instructions. Take 2-8 units as needed  Sliding scale.over 250--2 units over 300--4 units over 350--6 units over 400--8 units Up to 50 units daily: For diabetes management Patient taking differently: Inject 2-8 Units into the skin See admin instructions. Inject 2-8 units into the skin one to four times a day as needed for elevated BGL, PER SLIDING SCALE- BGL over 250 = 2 units; over 300 = 4 units; over 350 = 6 units; over 400 = 8 units (Up to 50 units daily: For diabetes management) 10/20/21  Yes Glade Lloyd, MD  potassium chloride (KLOR-CON) 10 MEQ tablet Take 1 tablet (10 mEq total) by mouth as needed. For severe leg cramps. 12/01/23  Yes Jodelle Red, MD  sertraline (ZOLOFT) 100 MG tablet Take 100 mg by mouth daily. 03/11/21  Yes [provider]  insulin glargine-yfgn (SEMGLEE) 100 UNIT/ML Pen Inject 60 Units into the skin daily. Patient not taking: Reported on  02/02/2024    [provider]  JANUVIA 100 MG tablet Take 100 mg by mouth daily. Patient not taking: Reported on 02/02/2024 05/07/21   [provider]    Current Facility-Administered Medications  Medication Dose Route Frequency Provider Last Rate Last Admin   0.9 %  sodium chloride infusion (Manually program via Guardrails IV Fluids)   Intravenous Once [place], Sarina Ill, MD       insulin aspart (novoLOG) injection 0-5  Units  0-5 Units Subcutaneous QHS Darreld Mclean R, MD       insulin aspart (novoLOG) injection 0-9 Units  0-9 Units Subcutaneous TID WC Charlsie Quest, MD   2 Units at 02/02/24 0827   ondansetron (ZOFRAN) tablet 4 mg  4 mg Oral Q6H PRN Charlsie Quest, MD       Or   ondansetron (ZOFRAN) injection 4 mg  4 mg Intravenous Q6H PRN Charlsie Quest, MD       Oral care mouth rinse  15 mL Mouth Rinse PRN Charlsie Quest, MD       senna-docusate (Senokot-S) tablet 1 tablet  1 tablet Oral QHS PRN Darreld Mclean R, MD       sodium chloride flush (NS) 0.9 % injection 3 mL  3 mL Intravenous Q12H Darreld Mclean R, MD   3 mL at 02/01/24 2253    Allergies as of 02/01/2024 - Review Complete 02/01/2024  Allergen Reaction Noted   Atorvastatin Other (See Comments) 07/07/2019    Family History  Problem Relation Age of Onset   COPD Father     Social History   Socioeconomic History   Marital status: Married    Spouse name: Not on file   Number of children: Not on file   Years of education: Not on file   Highest education level: Not on file  Occupational History   Occupation: Building Education administrator  Tobacco Use   Smoking status: Some Days    Current packs/day: 0.25    Average packs/day: 0.3 packs/day for 30.0 years (7.5 ttl pk-yrs)    Types: Cigarettes   Smokeless tobacco: Former  Building services engineer status: Never Used  Substance and Sexual Activity   Alcohol use: No   Drug use: Not Currently    Types: Heroin, IV    Comment: Fentanyl and "Oxy" per patient    Sexual activity: Never  Other Topics Concern   Not on file  Social History Narrative   Not on file   Social Drivers of Health   Financial Resource Strain: Low Risk  (04/13/2023)   Received from [facility]   Overall Financial Resource Strain (CARDIA)    Difficulty of Paying Living Expenses: Not hard at all  Food Insecurity: No Food Insecurity (02/01/2024)   Hunger Vital Sign    Worried About Running Out of Food in the  Last Year: Never true    Ran Out of Food in the Last Year: Never true  Transportation Needs: Unmet Transportation Needs (02/01/2024)   PRAPARE - Administrator, Civil Service (Medical): Yes    Lack of Transportation (Non-Medical): Yes  Physical Activity: Not on file  Stress: Not on file  Social Connections: Unknown (03/04/2022)   Received from [facility], [facility]   Social Network    Social Network: Not on file  Intimate Partner Violence: Not At Risk (02/01/2024)   Humiliation, Afraid, Rape, and Kick questionnaire    Fear of Current or Ex-Partner: No  Emotionally Abused: No    Physically Abused: No    Sexually Abused: No    Review of Systems: As per HPI, all others negative  Physical Exam: Vital signs in last 24 hours: Temp:  [98 F (36.7 C)-98.6 F (37 C)] 98 F (36.7 C) (04/01 0625) Pulse Rate:  [73-88] 73 (04/01 0625) Resp:  [15-20] 18 (04/01 0625) BP: (106-141)/(59-83) 123/76 (04/01 0625) SpO2:  [98 %-100 %] 99 % (04/01 0625) Weight:  [97.5 kg] 97.5 kg (03/31 2040) Last BM Date : 01/31/24 General:   Alert,  Well-developed, well-nourished, pleasant and cooperative in NAD Head:  Normocephalic and atraumatic. Eyes:  Sclera clear, no icterus.   Conjunctiva pale Ears:  Normal auditory acuity. Nose:  No deformity, discharge,  or lesions. Mouth:  No deformity or lesions.  Oropharynx pale and dry Neck:  Supple; no masses or thyromegaly. Lungs:  No visible distress Abdomen:  Soft, nontender and nondistended. No masses, hepatosplenomegaly or hernias noted. No peritonitis Msk:  Symmetrical without gross deformities. Normal posture. Pulses:  Normal pulses noted. Extremities:  Without clubbing or edema. Neurologic:  Alert and  oriented x4;  grossly normal neurologically. Skin:  Patchy ecchymoses and telangiectasias, otherwise intact without significant lesions or rashes. Psych:  Alert and cooperative. Normal mood and affect.   Lab Results: Recent Labs     02/01/24 1735 02/02/24 0800  WBC 1.8* 1.6*  HGB 5.1* 6.0*  HCT 18.9* 21.0*  PLT 27* 21*   BMET Recent Labs    02/01/24 1733 02/02/24 0800  NA 138 134*  K 4.1 3.6  CL 107 106  CO2 22 20*  GLUCOSE 145* 184*  BUN 22* 17  CREATININE 1.28* 1.14  CALCIUM 8.8* 8.1*   LFT Recent Labs    02/02/24 0800  PROT 6.7  ALBUMIN 3.5  AST 27  ALT 23  ALKPHOS 81  BILITOT 0.5   PT/INR No results for input(s): "LABPROT", "INR" in the last 72 hours.  Studies/Results: DG Chest 2 View Result Date: 02/01/2024 CLINICAL DATA:  Chest pain EXAM: CHEST - 2 VIEW COMPARISON:  Chest x-ray 10/18/2021 FINDINGS: There are increased central interstitial markings. There is no focal lung infiltrate, pleural effusion or pneumothorax. The cardiomediastinal silhouette is within normal limits. No acute fractures are seen. IMPRESSION: Increased central interstitial markings may reflect bronchitis or atypical pneumonia. Electronically Signed   By: Darliss Cheney M.D.   On: 02/01/2024 17:55    Impression:   Pancytopenia (acutely worsening chronic neutropenia and thrombocytopenia, but new-onset worsening anemia). 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 FOBT negative anemia.  Plan:   Hematology consult.  I am suspicious for new onset bone marrow process, on top of his portal hypertension-mediated thrombocytopenia and idiopathic neutropenia.  Patient no longer has hepatitis C and does not drink alcohol. I would consider endoscopy this admission after hematology evaluation to rule out bone marrow process and after platelet counts are at least 50.  I don't think he needs colonoscopy at the present time, without reviewing prior colonoscopy records.  Patient describes having endoscopies and colonoscopies years ago with [facility] in [place].   Regular diet. [facility] will follow.   LOS: 0 days   Charliegh Vasudevan M  02/02/2024, 11:52 AM  Cell [phone] If no answer  or after 5 PM call [phone]

## 2024-02-02 NOTE — Progress Notes (Signed)
 [facility]  PROGRESS NOTE  Chase Reed ZOX:[id number] DOB: 08-28-1966 DOA: 02/01/2024 PCP: Courtney Heys, PA-C   Brief HPI:   58 y.o. male with medical history significant for chronic hepatitis C with cirrhosis, chronic pancytopenia, type 2 diabetes, substance use disorder, depression/anxiety who presented to the ED for evaluation of chest pain and palpitations.   Patient reports over the last 3 weeks he has had exertional dyspnea, chest discomfort, and palpitations.  He has been fatigued.  He was concerned that this was cardiac related and made an appointment with his cardiologist but was also told that she does symptoms persist or worsen that he should present to the ED for further evaluation. Ferritin 5.  Iron, B12, folate in process.  FOBT is negative.   2 view chest x-ray showed increased central interstitial markings.  No focal consolidation, effusion, pneumothorax.   Assessment/Plan:    Symptomatic acute on chronic anemia: Hemoglobin 5.1 on admission compared to previous 8.91-year ago.  Patient denies any obvious bleeding.  Ferritin is 5.  FOBT negative. -Status post 2 units PRBC -Anemia panel showed severe iron deficiency with 4% saturation, serum iron 26, ferritin 5 -Will consult gastroenterology  Pancytopenia -Patient has thrombocytopenia, leukopenia -Will consult hematology for further recommendations -May need more marrow biopsy -Reticulocyte count is high    Chronic hepatitis C with cirrhosis: Pancytopenia likely related to cirrhosis otherwise no evidence of decompensation.  Mentating well on admission.   Type 2 diabetes: Placed on SSI.   Depression/anxiety: Patient states he is not currently taking any medications for mood.     History of substance use disorder: Patient reports that he is sober from alcohol and drugs for about 9 months now.   Medications     insulin aspart  0-5 Units Subcutaneous QHS   insulin aspart  0-9 Units  Subcutaneous TID WC   sodium chloride flush  3 mL Intravenous Q12H     Data Reviewed:   CBG:  Recent Labs  Lab 02/01/24 2109 02/02/24 0731  GLUCAP 163* 174*    SpO2: 99 %    Vitals:   02/02/24 0258 02/02/24 0313 02/02/24 0313 02/02/24 0625  BP: 115/76 113/74 113/74 123/76  Pulse: 74 75 75 73  Resp: 18 18 18 18   Temp: 98.3 F (36.8 C) 98.5 F (36.9 C) 98.5 F (36.9 C) 98 F (36.7 C)  TempSrc: Oral Oral Oral Oral  SpO2:   98% 99%  Weight:      Height:          Data Reviewed:  Basic Metabolic Panel: Recent Labs  Lab 02/01/24 1733 02/01/24 1735  NA 138  --   K 4.1  --   CL 107  --   CO2 22  --   GLUCOSE 145*  --   BUN 22*  --   CREATININE 1.28*  --   CALCIUM 8.8*  --   MG  --  1.8    CBC: Recent Labs  Lab 02/01/24 1735  WBC 1.8*  HGB 5.1*  HCT 18.9*  MCV 84.4  PLT 27*    LFT Recent Labs  Lab 02/01/24 1733  AST 33  ALT 24  ALKPHOS 97  BILITOT 0.5  PROT 8.0  ALBUMIN 4.7     Antibiotics: Anti-infectives (From admission, onward)    None        DVT prophylaxis: SCDs  Code Status: Full code  Family Communication:    CONSULTS gastroenterology   Subjective   Denies vomiting blood.  No black stools.  Anemia panel showed severe iron deficiency.  Very high reticulocyte count.   Objective    Physical Examination:  General-appears in no acute distress Heart-S1-S2, regular, no murmur auscultated Lungs-clear to auscultation bilaterally, no wheezing or crackles auscultated Abdomen-soft, nontender, no organomegaly Extremities-no edema in the lower extremities Neuro-alert, oriented x3, no focal deficit noted   Status is: Inpatient:             Chase Reed   [facility] If 7PM-7AM, please contact night-coverage at [email or web address], Office  [phone]   02/02/2024, 8:22 AM  LOS: 0 days

## 2024-02-03 DIAGNOSIS — D649 Anemia, unspecified: Secondary | ICD-10-CM | POA: Diagnosis not present

## 2024-02-03 LAB — COMPREHENSIVE METABOLIC PANEL WITH GFR
ALT: 24 U/L (ref 0–44)
AST: 29 U/L (ref 15–41)
Albumin: 3.2 g/dL — ABNORMAL LOW (ref 3.5–5.0)
Alkaline Phosphatase: 76 U/L (ref 38–126)
Anion gap: 8 (ref 5–15)
BUN: 15 mg/dL (ref 6–20)
CO2: 20 mmol/L — ABNORMAL LOW (ref 22–32)
Calcium: 8.4 mg/dL — ABNORMAL LOW (ref 8.9–10.3)
Chloride: 109 mmol/L (ref 98–111)
Creatinine, Ser: 0.93 mg/dL (ref 0.61–1.24)
GFR, Estimated: >60 mL/min (ref >60)
Glucose, Bld: 116 mg/dL — ABNORMAL HIGH (ref 70–99)
Potassium: 4.2 mmol/L (ref 3.5–5.1)
Sodium: 137 mmol/L (ref 135–145)
Total Bilirubin: 0.8 mg/dL (ref 0.0–1.2)
Total Protein: 6.4 g/dL — ABNORMAL LOW (ref 6.5–8.1)

## 2024-02-03 LAB — GLUCOSE, CAPILLARY
Glucose-Capillary: 125 mg/dL — ABNORMAL HIGH (ref 70–99)
Glucose-Capillary: 176 mg/dL — ABNORMAL HIGH (ref 70–99)
Glucose-Capillary: 165 mg/dL — ABNORMAL HIGH (ref 70–99)
Glucose-Capillary: 236 mg/dL — ABNORMAL HIGH (ref 70–99)

## 2024-02-03 LAB — CBC
HCT: 23.6 % — ABNORMAL LOW (ref 39.0–52.0)
Hemoglobin: 7 g/dL — ABNORMAL LOW (ref 13.0–17.0)
MCH: 25.5 pg — ABNORMAL LOW (ref 26.0–34.0)
MCHC: 29.7 g/dL — ABNORMAL LOW (ref 30.0–36.0)
MCV: 85.8 fL (ref 80.0–100.0)
Platelets: 17 10*3/uL — CL (ref 150–400)
RBC: 2.75 MIL/uL — ABNORMAL LOW (ref 4.22–5.81)
RDW: 17.2 % — ABNORMAL HIGH (ref 11.5–15.5)
WBC: 1.8 10*3/uL — ABNORMAL LOW (ref 4.0–10.5)
nRBC: 0 % (ref 0.0–0.2)

## 2024-02-03 LAB — PREPARE RBC (CROSSMATCH)

## 2024-02-03 MED ORDER — SODIUM CHLORIDE 0.9 % IV SOLN
100.0000 mg | Freq: Once | INTRAVENOUS | Status: DC
  Filled 2024-02-03: qty 5

## 2024-02-03 MED ORDER — IRON SUCROSE 200 MG IVPB - SIMPLE MED
200.0000 mg | Status: DC
  Administered 2024-02-03 – 2024-02-07 (×3): 200 mg via INTRAVENOUS
  Filled 2024-02-03 (×3): qty 200

## 2024-02-03 MED ORDER — SODIUM CHLORIDE 0.9% IV SOLUTION: Freq: Once | INTRAVENOUS | Status: AC

## 2024-02-03 MED ORDER — VITAMIN B-12 1000 MCG PO TABS
1000.0000 ug | ORAL_TABLET | Freq: Every day | ORAL | Status: DC
  Administered 2024-02-03 – 2024-02-08 (×6): 1000 ug via ORAL
  Filled 2024-02-03 (×6): qty 1

## 2024-02-03 MED ORDER — IRON SUCROSE 200 MG IVPB - SIMPLE MED
200.0000 mg | Freq: Every day | Status: DC
  Filled 2024-02-03: qty 110

## 2024-02-03 NOTE — Progress Notes (Signed)
 Labs reviewed today.  Plts now down to 17.  Hgb slight down 7.5 to 7.0  WBC stable 1.8.  There is no active bleeding and patient has FOBT negative stools.  Hematology consult appreciated.  I have no plans to pursue any type of endoscopic evaluation until the nature of his pancytopenia is better clarified, which may very require bone marrow biopsy.  I would not attributed all of this due to cirrhosis until proven otherwise.  Further, he will need platelet count of at least 50 before I would consider endoscopy.  [facility] will follow along at a distance pending hematology evaluation.

## 2024-02-03 NOTE — Progress Notes (Signed)
 Pt has been encouraged to brush teeth for the past 2 days. Pt continues to decline. Pt does have toothbrush and toothpaste in room which both are unopened. Will continue to encourage.

## 2024-02-03 NOTE — Progress Notes (Signed)
 PROGRESS NOTE  Chase Reed  ZOX:[id number] DOB: June 01, 1966 DOA: 02/01/2024 PCP: Courtney Heys, PA-C   Brief Narrative: Patient is a 58 year old male with history of chronic hepatitis C with cirrhosis, chronic pancytopenia, diabetes type 2, substance use, depression, anxiety who presented with chest pain, palpitations, exertional dyspnea, fatigue.  On presentation, his hemoglobin was found to be in the range of 5.  Given 2 units of  blood transfusion.  Found to have low iron.  Hematology, gastroenterology consulted.  Assessment & Plan:  Principal Problem:   Symptomatic anemia Active Problems:   Chronic hepatitis C with cirrhosis (HCC)   Pancytopenia (HCC)   Type 2 diabetes mellitus with diabetic polyneuropathy, with long-term current use of insulin (HCC)   Anemia  Acute on chronic iron deficiency anemia/pancytopenia/severe thrombocytopenia: Presented with hemoglobin in the range of 5.  Reported dyspnea on minimal exertion. has chronic anemia but his baseline hemoglobin is around 8-9.  No obvious source of bleeding.  No hematochezia or melena.  Given 2 units of blood transfusion.  GI consulted.  No plan for endoscopy or colonoscopy. Hematology also following.  May need bone marrow biopsy.  FOBT negative. Iron low, will order IV iron.  Will also order a unit of PRBC today  Chronic hepatitis C with cirrhosis: Cirrhosis with no evidence of decompensation.  No ascites.  He says his hepatitis C is already treated.  Type 2 diabetes: Currently on sliding scale.  A1c of 5.4  AKI: Resolved  Depression/anxiety: Currently not taking any medications for mood  History of substance use disorder: Reports that he is sober from alcohol and drugs for 9 months           DVT prophylaxis:SCDs Start: 02/01/24 2123     Code Status: Full Code  Family Communication: None at bedside  Patient status:Inpatient  Patient is from :home  Anticipated discharge WJ:XBJY  Estimated DC  date:after full work up   Consultants: GI,hematology  Procedures:None yet  Antimicrobials:  Anti-infectives (From admission, onward)    None       Subjective: Patient seen and examined at bedside today.  Hemodynamically stable.  Very comfortable.  Lying in bed.  Denies any new complaints.  Pain, nausea or vomiting.  No change in the color of the stool.  He is only complaint is dyspnea on exertion.  Currently on room air.  Objective: Vitals:   02/02/24 1730 02/02/24 1957 02/03/24 0521 02/03/24 1238  BP: 118/73 124/78 101/64 125/80  Pulse: 73 74 65 73  Resp: 16 14 15 20   Temp: 98.1 F (36.7 C) 98.5 F (36.9 C) 98.1 F (36.7 C) 98.1 F (36.7 C)  TempSrc: Oral Oral Oral Oral  SpO2:  98% 94% 98%  Weight:      Height:        Intake/Output Summary (Last 24 hours) at 02/03/2024 1252 Last data filed at 02/02/2024 1730 Gross per 24 hour  Intake 364 ml  Output --  Net 364 ml   Filed Weights   02/01/24 2040  Weight: 97.5 kg    Examination:  General exam: Overall comfortable, not in distress HEENT: PERRL Respiratory system:  no wheezes or crackles  Cardiovascular system: S1 & S2 heard, RRR.  Gastrointestinal system: Abdomen is mildly distended, soft and nontender. Central nervous system: Alert and oriented Extremities: No edema, no clubbing ,no cyanosis Skin: No rashes, no ulcers,no icterus     Data Reviewed: I have personally reviewed following labs and imaging studies  CBC: Recent Labs  Lab 02/01/24 1735 02/02/24 0800 02/02/24 1854 02/03/24 0508  WBC 1.8* 1.6*  --  1.8*  NEUTROABS  --  1.0*  --   --   HGB 5.1* 6.0* 7.5* 7.0*  HCT 18.9* 21.0* 25.0* 23.6*  MCV 84.4 87.1  --  85.8  PLT 27* 21*  --  17*   Basic Metabolic Panel: Recent Labs  Lab 02/01/24 1733 02/01/24 1735 02/02/24 0800 02/03/24 0508  NA 138  --  134* 137  K 4.1  --  3.6 4.2  CL 107  --  106 109  CO2 22  --  20* 20*  GLUCOSE 145*  --  184* 116*  BUN 22*  --  17 15  CREATININE 1.28*   --  1.14 0.93  CALCIUM 8.8*  --  8.1* 8.4*  MG  --  1.8  --   --      Recent Results (from the past 240 hours)  MRSA Next Gen by PCR, Nasal     Status: Abnormal   Collection Time: 02/02/24  3:00 AM   Specimen: Nasal Mucosa; Nasal Swab  Result Value Ref Range Status   MRSA by PCR Next Gen DETECTED (A) NOT DETECTED Final    Comment: (NOTE) The GeneXpert MRSA Assay (FDA approved for NASAL specimens only), is one component of a comprehensive MRSA colonization surveillance program. It is not intended to diagnose MRSA infection nor to guide or monitor treatment for MRSA infections. Test performance is not FDA approved in patients less than 45 years old. Performed at [facility], [address], [place], [place] [address]      Radiology Studies: DG Chest 2 View Result Date: 02/01/2024 CLINICAL DATA:  Chest pain EXAM: CHEST - 2 VIEW COMPARISON:  Chest x-ray 10/18/2021 FINDINGS: There are increased central interstitial markings. There is no focal lung infiltrate, pleural effusion or pneumothorax. The cardiomediastinal silhouette is within normal limits. No acute fractures are seen. IMPRESSION: Increased central interstitial markings may reflect bronchitis or atypical pneumonia. Electronically Signed   By: Darliss Cheney M.D.   On: 02/01/2024 17:55    Scheduled Meds:  insulin aspart  0-5 Units Subcutaneous QHS   insulin aspart  0-9 Units Subcutaneous TID WC   sodium chloride flush  3 mL Intravenous Q12H   Continuous Infusions:   LOS: 1 day   Burnadette Pop, MD [facility] P4/12/2023, 12:52 PM

## 2024-02-03 NOTE — Plan of Care (Signed)

## 2024-02-04 DIAGNOSIS — D649 Anemia, unspecified: Secondary | ICD-10-CM | POA: Diagnosis not present

## 2024-02-04 DIAGNOSIS — D61818 Other pancytopenia: Secondary | ICD-10-CM | POA: Diagnosis not present

## 2024-02-04 LAB — TYPE AND SCREEN
ABO/RH(D): O POS
Antibody Screen: NEGATIVE
Unit division: 0
Unit division: 0
Unit division: 0
Unit division: 0

## 2024-02-04 LAB — BPAM RBC
Blood Product Expiration Date: 202504102359
Blood Product Expiration Date: 202504292359
Blood Product Expiration Date: 202505012359
Blood Product Expiration Date: 202504292359
ISSUE DATE / TIME: 202504011421
ISSUE DATE / TIME: 202504021401
ISSUE DATE / TIME: 202503312302
ISSUE DATE / TIME: 202504010251
Unit Type and Rh: 5100
Unit Type and Rh: 5100
Unit Type and Rh: 202504292359
Unit Type and Rh: 5100
Unit Type and Rh: 5100

## 2024-02-04 LAB — CBC
HCT: 27.4 % — ABNORMAL LOW (ref 39.0–52.0)
Hemoglobin: 8.2 g/dL — ABNORMAL LOW (ref 13.0–17.0)
MCH: 26 pg (ref 26.0–34.0)
MCHC: 29.9 g/dL — ABNORMAL LOW (ref 30.0–36.0)
MCV: 87 fL (ref 80.0–100.0)
Platelets: 18 10*3/uL — CL (ref 150–400)
RBC: 3.15 MIL/uL — ABNORMAL LOW (ref 4.22–5.81)
RDW: 17.2 % — ABNORMAL HIGH (ref 11.5–15.5)
WBC: 2.2 10*3/uL — ABNORMAL LOW (ref 4.0–10.5)
nRBC: 0 % (ref 0.0–0.2)

## 2024-02-04 LAB — GLUCOSE, CAPILLARY
Glucose-Capillary: 179 mg/dL — ABNORMAL HIGH (ref 70–99)
Glucose-Capillary: 154 mg/dL — ABNORMAL HIGH (ref 70–99)
Glucose-Capillary: 155 mg/dL — ABNORMAL HIGH (ref 70–99)
Glucose-Capillary: 149 mg/dL — ABNORMAL HIGH (ref 70–99)

## 2024-02-04 MED ORDER — CHLORHEXIDINE GLUCONATE CLOTH 2 % EX PADS
6.0000 | MEDICATED_PAD | Freq: Every day | CUTANEOUS | Status: DC
  Administered 2024-02-05 – 2024-02-08 (×4): 6 via TOPICAL

## 2024-02-04 MED ORDER — SODIUM CHLORIDE 0.9% IV SOLUTION: Freq: Once | INTRAVENOUS | Status: AC

## 2024-02-04 MED ORDER — MUPIROCIN 2 % EX OINT
1.0000 | TOPICAL_OINTMENT | Freq: Two times a day (BID) | CUTANEOUS | Status: DC
  Administered 2024-02-04 – 2024-02-08 (×7): 1 via NASAL
  Filled 2024-02-04: qty 22

## 2024-02-04 NOTE — Consult Note (Signed)
 Chief Complaint: Pancytopenia; referred for CT guided bone marrow biopsy for further evaluation  Referring Provider(s): Dorsey,J  Supervising Physician: Irish Lack  Patient Status: [facility] - In-pt  History of Present Illness: Chase Reed is a 58 y.o. male smoker with PMH sig for cirrhosis, DM, hepatitis C and known pancytopenia who was recently admitted with acute worsening of his blood counts, dyspnea, fatigue. Pt was seen by oncology and request now received for CT guided bone marrow biopsy for further evaluation. Latest WBC 2.2, hgb 8.2, plts 18k. Pt receiving platelet transfusion.    Patient is Full Code  Past Medical History:  Diagnosis Date   Chronic idiopathic thrombocytopenia (HCC)    Chronic neutropenia (HCC)    Cirrhosis (HCC)    DM (diabetes mellitus) (HCC)    Hepatitis     Past Surgical History:  Procedure Laterality Date   APPLICATION OF WOUND VAC N/A 12/24/2018   Procedure: APPLICATION OF WOUND VAC;  Surgeon: Alleen Borne, MD;  Location: [facility] OR;  Service: Thoracic;  Laterality: N/A;   APPLICATION OF WOUND VAC N/A 12/27/2018   Procedure: WOUND VAC CHANGE;  Surgeon: Alleen Borne, MD;  Location: [facility] OR;  Service: Thoracic;  Laterality: N/A;   APPLICATION OF WOUND VAC N/A 01/14/2019   Procedure: APPLICATION OF WOUND VAC;  Surgeon: Alleen Borne, MD;  Location: [facility] OR;  Service: Vascular;  Laterality: N/A;   I & D EXTREMITY N/A 01/14/2019   Procedure: DEBRIDEMENT OF STERNOCLAVICULAR JOINT;  Surgeon: Alleen Borne, MD;  Location: [facility] OR;  Service: Vascular;  Laterality: N/A;   I & D EXTREMITY Right 10/03/2020   Procedure: Irrigation debridement of RIGHT forearm subfascial abscess measuring approximately 6 x 3 x 1.5 cm;  Surgeon: Ernest Mallick, MD;  Location: [facility] OR;  Service: Orthopedics;  Laterality: Right;   LITHOTRIPSY     STERNAL WOUND DEBRIDEMENT N/A 12/24/2018   Procedure: I&D CHEST WALL ABSCESS;  Surgeon: Alleen Borne, MD;  Location: [facility] OR;   Service: Thoracic;  Laterality: N/A;   STERNAL WOUND DEBRIDEMENT N/A 12/27/2018   Procedure: DEBRIDEMENT OF CHEST WALL WOUND;  Surgeon: Alleen Borne, MD;  Location: [facility] OR;  Service: Thoracic;  Laterality: N/A;   TEE WITHOUT CARDIOVERSION N/A 01/03/2019   Procedure: TRANSESOPHAGEAL ECHOCARDIOGRAM (TEE);  Surgeon: Thurmon Fair, MD;  Location: [facility] ENDOSCOPY;  Service: Cardiovascular;  Laterality: N/A;    Allergies: Atorvastatin  Medications: Prior to Admission medications   Medication Sig Start Date End Date Taking? Authorizing Provider  gabapentin (NEURONTIN) 600 MG tablet Take 600 mg by mouth 4 (four) times daily. 01/20/19  Yes [provider]  insulin aspart (NOVOLOG) 100 UNIT/ML injection Inject 2-8 Units into the skin See admin instructions. Take 2-8 units as needed  Sliding scale.over 250--2 units over 300--4 units over 350--6 units over 400--8 units Up to 50 units daily: For diabetes management Patient taking differently: Inject 2-8 Units into the skin See admin instructions. Inject 2-8 units into the skin one to four times a day as needed for elevated BGL, PER SLIDING SCALE- BGL over 250 = 2 units; over 300 = 4 units; over 350 = 6 units; over 400 = 8 units (Up to 50 units daily: For diabetes management) 10/20/21  Yes Glade Lloyd, MD  potassium chloride (KLOR-CON) 10 MEQ tablet Take 1 tablet (10 mEq total) by mouth as needed. For severe leg cramps. 12/01/23  Yes Jodelle Red, MD  sertraline (ZOLOFT) 100 MG tablet Take 100 mg  by mouth daily. 03/11/21  Yes [provider]  insulin glargine-yfgn (SEMGLEE) 100 UNIT/ML Pen Inject 60 Units into the skin daily. Patient not taking: Reported on 02/02/2024    [provider]  JANUVIA 100 MG tablet Take 100 mg by mouth daily. Patient not taking: Reported on 02/02/2024 05/07/21   [provider]     Family History  Problem Relation Age of Onset   COPD Father     Social History   Socioeconomic  History   Marital status: Married    Spouse name: Not on file   Number of children: Not on file   Years of education: Not on file   Highest education level: Not on file  Occupational History   Occupation: Building Education administrator  Tobacco Use   Smoking status: Some Days    Current packs/day: 0.25    Average packs/day: 0.3 packs/day for 30.0 years (7.5 ttl pk-yrs)    Types: Cigarettes   Smokeless tobacco: Former  Building services engineer status: Never Used  Substance and Sexual Activity   Alcohol use: No   Drug use: Not Currently    Types: Heroin, IV    Comment: Fentanyl and "Oxy" per patient    Sexual activity: Never  Other Topics Concern   Not on file  Social History Narrative   Not on file   Social Drivers of Health   Financial Resource Strain: Low Risk  (04/13/2023)   Received from [facility]   Overall Financial Resource Strain (CARDIA)    Difficulty of Paying Living Expenses: Not hard at all  Food Insecurity: No Food Insecurity (02/01/2024)   Hunger Vital Sign    Worried About Running Out of Food in the Last Year: Never true    Ran Out of Food in the Last Year: Never true  Transportation Needs: No Transportation Needs (02/03/2024)   PRAPARE - Administrator, Civil Service (Medical): No    Lack of Transportation (Non-Medical): No  Recent Concern: Transportation Needs - Unmet Transportation Needs (02/01/2024)   PRAPARE - Administrator, Civil Service (Medical): Yes    Lack of Transportation (Non-Medical): Yes  Physical Activity: Not on file  Stress: Not on file  Social Connections: Unknown (03/04/2022)   Received from [facility]   Social Network    Social Network: Not on file      Review of Systems; denies fever,HA,CP,worsening dyspnea, cough, abd/back pain,N/V or visible bleeding  Vital Signs: BP 137/82 (BP Location: Right Arm)   Pulse 72   Temp 97.9 F (36.6 C) (Oral)   Resp 18   Ht 6' (1.829 m)   Wt 215 lb  (97.5 kg)   SpO2 98%   BMI 29.16 kg/m   Advance Care Plan: no documents on file  Physical Exam; awake/alert; chest- sl dim BS bases; heart- RRR; abd-soft,+BS,NT; no LE edema  Imaging: DG Chest 2 View Result Date: 02/01/2024 CLINICAL DATA:  Chest pain EXAM: CHEST - 2 VIEW COMPARISON:  Chest x-ray 10/18/2021 FINDINGS: There are increased central interstitial markings. There is no focal lung infiltrate, pleural effusion or pneumothorax. The cardiomediastinal silhouette is within normal limits. No acute fractures are seen. IMPRESSION: Increased central interstitial markings may reflect bronchitis or atypical pneumonia. Electronically Signed   By: Darliss Cheney M.D.   On: 02/01/2024 17:55    Labs:  CBC: Recent Labs    02/01/24 1735 02/02/24 0800 02/02/24 1854 02/03/24 0508 02/04/24 0459  WBC 1.8*  1.6*  --  1.8* 2.2*  HGB 5.1* 6.0* 7.5* 7.0* 8.2*  HCT 18.9* 21.0* 25.0* 23.6* 27.4*  PLT 27* 21*  --  17* 18*    COAGS: No results for input(s): "INR", "APTT" in the last 8760 hours.  BMP: Recent Labs    02/01/24 1733 02/02/24 0800 02/03/24 0508  NA 138 134* 137  K 4.1 3.6 4.2  CL 107 106 109  CO2 22 20* 20*  GLUCOSE 145* 184* 116*  BUN 22* 17 15  CALCIUM 8.8* 8.1* 8.4*  CREATININE 1.28* 1.14 0.93  GFRNONAA >60 >60 >60    LIVER FUNCTION TESTS: Recent Labs    02/01/24 1733 02/02/24 0800 02/03/24 0508  BILITOT 0.5 0.5 0.8  AST 33 27 29  ALT 24 23 24   ALKPHOS 97 81 76  PROT 8.0 6.7 6.4*  ALBUMIN 4.7 3.5 3.2*    TUMOR MARKERS: No results for input(s): "AFPTM", "CEA", "CA199", "CHROMGRNA" in the last 8760 hours.  Assessment and Plan: 58 y.o. male smoker with PMH sig for cirrhosis, DM, hepatitis C and known pancytopenia who was recently admitted with acute worsening of his blood counts, dyspnea, fatigue. Pt was seen by oncology and request now received for CT guided bone marrow biopsy for further evaluation. Latest WBC 2.2, hgb 8.2, plts 18k. Pt receiving platelet  transfusion.Risks and benefits of procedure was discussed with the patient  including, but not limited to bleeding, infection, damage to adjacent structures or low yield requiring additional tests.  All of the questions were answered and there is agreement to proceed.  Consent signed and in chart.  Procedure tent planned for either tomorrow am or early next week if pt remains IP  Thank you for allowing our service to participate in Chase Reed 's care.  Electronically Signed: D. Jeananne Rama, PA-C   02/04/2024, 3:24 PM      I spent a total of 20 Minutes    in face to face in clinical consultation, greater than 50% of which was counseling/coordinating care for CT guided bone marrow biopsy

## 2024-02-04 NOTE — Progress Notes (Signed)
 Chase Reed   DOB:[birth year]   MW#:[id number]      ASSESSMENT & PLAN:  Pancytopenia: Anemia - Hemoglobin stable 8.2 post transfusions.  Hemoglobin 5.1 on admission 02/01/2024. - Baseline hemoglobin appears to be in the 9-10 range. - Recommend PRBC transfusion for Hgb <7.0. - Patient seen in the past by hematology several years ago and has been lost to follow-up. - Patient denies any active bleeding, epistaxis, dark stool or other GI blood loss.  FOBT negative. - GI evaluation done, EGD possible this admission.  Findings concerning for a bleed which would explain drop in iron as well as low hemoglobin.  Unclear where bleeding is coming from as patient denies nosebleeds or gum bleeds or blood in urine and stools. - CT-guided bone marrow biopsy ordered due to worsening cytopenias, however may be delayed due to process issues.  Explained to patient and he is agreeable to waiting until this can be arranged. - IV iron with Venofer 200 mg every other day x 5 doses ordered, initiated 02/03/2024.  Recommend continuation as outpatient even if patient is discharged prior to completion of therapy.  Patient is in agreement. - Hematology/Dr. Leonides Schanz following.   Thrombocytopenia - Platelets low 18K today.  Patient is receiving 1 unit platelets at this time. - Baseline platelets in the mid 30s to 40 range.  Indeed patient reports that his platelets have not been over 40K in 10 years. - Recommend transfuse platelets for counts <10K or <50 K with acute bleeding. - Patient denies active bleeding at this time.   Leukopenia - WBC low but improving 2.2 today - Baseline WBC in the upper 1 range - No transfusional intervention recommended at this time   Fatigue Dyspnea on exertion - Improving per patient - Reports 3-week history of worsening fatigue and dyspnea on exertion - Likely due to to anemia - Continue supportive care   History of cirrhosis Hepatitis C -Patient states he was diagnosed with  cirrhosis over 20 years ago. -Noted elevated LFTs 1 year ago 08/27/2022. - LFTs currently WNL with total bili WNL - Continue to monitor CMP   Diabetes -Reports he is not on regular diabetic medication or insulin.  However he has been using sliding scale insulin to monitor himself at home. - Continue to monitor blood glucose levels     Code Status Full  Subjective:  Patient seen awake alert and oriented x 3 laying in bed with 1 unit platelets transfusing well.  Patient denies any new bleeding or any GI symptoms at this time.  Explained to patient that bone marrow biopsy may be delayed, patient agreeable to waiting until this can be arranged.  Objective:  Vitals:   02/04/24 1037 02/04/24 1152  BP: (!) 141/86 (!) 141/87  Pulse: 83 73  Resp: 16   Temp: 97.9 F (36.6 C) 98.1 F (36.7 C)  SpO2: 97% 97%     Intake/Output Summary (Last 24 hours) at 02/04/2024 1159 Last data filed at 02/04/2024 0900 Gross per 24 hour  Intake 602 ml  Output 800 ml  Net -198 ml     REVIEW OF SYSTEMS:   Constitutional: + Fatigue, denies fevers, chills or abnormal night sweats Eyes: Denies blurriness of vision, double vision or watery eyes Ears, nose, mouth, throat, and face: Denies mucositis or sore throat Respiratory: Denies cough, dyspnea or wheezes Cardiovascular: Denies palpitation, chest discomfort or lower extremity swelling Gastrointestinal:  Denies nausea, heartburn or change in bowel habits Skin: Denies abnormal skin rashes Lymphatics: Denies new  lymphadenopathy or easy bruising Neurological: Denies numbness, tingling or new weaknesses Behavioral/Psych: Mood is stable, no new changes  All other systems were reviewed with the patient and are negative.  PHYSICAL EXAMINATION: ECOG PERFORMANCE STATUS: 2 - Symptomatic, <50% confined to bed  Vitals:   02/04/24 1037 02/04/24 1152  BP: (!) 141/86 (!) 141/87  Pulse: 83 73  Resp: 16   Temp: 97.9 F (36.6 C) 98.1 F (36.7 C)  SpO2: 97% 97%    Filed Weights   02/01/24 2040  Weight: 215 lb (97.5 kg)    GENERAL: alert, no distress and comfortable SKIN: skin color, texture, turgor are normal, no rashes or significant lesions EYES: normal, conjunctiva are pink and non-injected, sclera clear OROPHARYNX: no exudate, no erythema and lips, buccal mucosa, and tongue normal  NECK: supple, thyroid normal size, non-tender, without nodularity LYMPH: no palpable lymphadenopathy in the cervical, axillary or inguinal LUNGS: clear to auscultation and percussion with normal breathing effort HEART: regular rate & rhythm and no murmurs and no lower extremity edema ABDOMEN: abdomen soft, non-tender and normal bowel sounds MUSCULOSKELETAL: no cyanosis of digits and no clubbing  PSYCH: alert & oriented x 3 with fluent speech NEURO: no focal motor/sensory deficits   All questions were answered. The patient knows to call the clinic with any problems, questions or concerns.   The total time spent in the appointment was 40 minutes encounter with patient including review of chart and various tests results, discussions about plan of care and coordination of care plan  Dawson Bills, NP 02/04/2024 11:59 AM    Labs Reviewed:  Lab Results  Component Value Date   WBC 2.2 (L) 02/04/2024   HGB 8.2 (L) 02/04/2024   HCT 27.4 (L) 02/04/2024   MCV 87.0 02/04/2024   PLT 18 (LL) 02/04/2024   Recent Labs    02/01/24 1733 02/02/24 0800 02/03/24 0508  NA 138 134* 137  K 4.1 3.6 4.2  CL 107 106 109  CO2 22 20* 20*  GLUCOSE 145* 184* 116*  BUN 22* 17 15  CREATININE 1.28* 1.14 0.93  CALCIUM 8.8* 8.1* 8.4*  GFRNONAA >60 >60 >60  PROT 8.0 6.7 6.4*  ALBUMIN 4.7 3.5 3.2*  AST 33 27 29  ALT 24 23 24   ALKPHOS 97 81 76  BILITOT 0.5 0.5 0.8    Studies Reviewed:  DG Chest 2 View Result Date: 02/01/2024 CLINICAL DATA:  Chest pain EXAM: CHEST - 2 VIEW COMPARISON:  Chest x-ray 10/18/2021 FINDINGS: There are increased central interstitial markings.  There is no focal lung infiltrate, pleural effusion or pneumothorax. The cardiomediastinal silhouette is within normal limits. No acute fractures are seen. IMPRESSION: Increased central interstitial markings may reflect bronchitis or atypical pneumonia. Electronically Signed   By: Darliss Cheney M.D.   On: 02/01/2024 17:55

## 2024-02-04 NOTE — Progress Notes (Addendum)
 PROGRESS NOTE  Chase Reed  YTK:[id number] DOB: [birth year] DOA: 02/01/2024 PCP: Courtney Heys, PA-C   Brief Narrative: Patient is a 58 year old male with history of chronic hepatitis C with cirrhosis, chronic pancytopenia, diabetes type 2, substance use, depression, anxiety who presented with chest pain, palpitations, exertional dyspnea, fatigue.  On presentation, his hemoglobin was found to be in the range of 5.  Given 3 units of  blood transfusion total.  Found to have low iron.  Hematology, gastroenterology consulted.  Plan for bone marrow biopsy by IR.  Assessment & Plan:  Principal Problem:   Symptomatic anemia Active Problems:   Chronic hepatitis C with cirrhosis (HCC)   Pancytopenia (HCC)   Type 2 diabetes mellitus with diabetic polyneuropathy, with long-term current use of insulin (HCC)   Anemia  Acute on chronic iron deficiency anemia/pancytopenia/severe thrombocytopenia: Presented with hemoglobin in the range of 5.  Reported dyspnea on minimal exertion. has chronic anemia but his baseline hemoglobin is around 8-9.  No obvious source of bleeding.  No hematochezia or melena.  GI consulted.  No plan for endoscopy or colonoscopy. Currently on IV iron.  Received 3 units of blood transfusion during this hospitalization.  Hemoglobin today in the range of 8.  Being given 2 units of platelets today.  IR planning for bone marrow biopsy. Started on vitamin B 12 supplementation for low normal vitamin B12  Chronic hepatitis C with cirrhosis: Cirrhosis with no evidence of decompensation.  No ascites.  He says his hepatitis C is already treated.  Type 2 diabetes: Currently on sliding scale.  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  Hemodynamically stable.  Lying in bed.  Comfortably sleeping.  Denies any abdomen pain, nausea or vomiting.  No hematochezia or melena  Objective: Vitals:   02/04/24 0409 02/04/24 1017 02/04/24 1037 02/04/24 1152  BP: 124/75 131/75 (!) 141/86 (!) 141/87  Pulse: 68 71 83 73  Resp: 14 16 16    Temp: 97.8 F (36.6 C) 97.9 F (36.6 C) 97.9 F (36.6 C) 98.1 F (36.7 C)  TempSrc: Oral Oral Oral Oral  SpO2: 98% 97% 97% 97%  Weight:      Height:        Intake/Output Summary (Last 24 hours) at 02/04/2024 1152 Last data filed at 02/04/2024 0900 Gross per 24 hour  Intake 602 ml  Output 800 ml  Net -198 ml   Filed Weights   02/01/24 2040  Weight: 97.5 kg    Examination:   General exam: Overall comfortable, not in distress HEENT: PERRL Respiratory system:  no wheezes or crackles  Cardiovascular system: S1 & S2 heard, RRR.  Gastrointestinal system: Abdomen is mildly distended, soft and nontender. Central nervous system: Alert and oriented Extremities:trace lower extremity edema, no clubbing ,no cyanosis Skin: No rashes, no ulcers,no icterus     Data Reviewed: I have personally reviewed following labs  and imaging studies  CBC: Recent Labs  Lab 02/01/24 1735 02/02/24 0800 02/02/24 1854 02/03/24 0508 02/04/24 0459  WBC 1.8* 1.6*  --  1.8* 2.2*  NEUTROABS  --  1.0*  --   --   --   HGB 5.1* 6.0* 7.5* 7.0* 8.2*  HCT 18.9* 21.0* 25.0* 23.6* 27.4*  MCV 84.4 87.1  --  85.8 87.0  PLT 27* 21*  --  17* 18*   Basic Metabolic Panel: Recent Labs  Lab 02/01/24 1733 02/01/24 1735 02/02/24 0800 02/03/24 0508  NA 138  --  134* 137  K 4.1   --  3.6 4.2  CL 107  --  106 109  CO2 22  --  20* 20*  GLUCOSE 145*  --  184* 116*  BUN 22*  --  17 15  CREATININE 1.28*  --  1.14 0.93  CALCIUM 8.8*  --  8.1* 8.4*  MG  --  1.8  --   --      Recent Results (from the past 240 hours)  MRSA Next Gen by PCR, Nasal     Status: Abnormal   Collection Time: 02/02/24  3:00 AM   Specimen: Nasal Mucosa; Nasal Swab  Result Value Ref Range Status   MRSA by PCR Next Gen DETECTED (A) NOT DETECTED Final    Comment: (NOTE) The GeneXpert MRSA Assay (FDA approved for NASAL specimens only), is one component of a comprehensive MRSA colonization surveillance program. It is not intended to diagnose MRSA infection nor to guide or monitor treatment for MRSA infections. Test performance is not FDA approved in patients less than 3 years old. Performed at [facility], [address], [place], [place] [address]      Radiology Studies: No results found.   Scheduled Meds:  Chlorhexidine Gluconate Cloth  6 each Topical Daily   vitamin B-12  1,000 mcg Oral Daily   insulin aspart  0-5 Units Subcutaneous QHS   insulin aspart  0-9 Units Subcutaneous TID WC   mupirocin ointment  1 Application Nasal BID   sodium chloride flush  3 mL Intravenous Q12H   Continuous Infusions:  iron sucrose 200 mg (02/03/24 1722)     LOS: 2 days   Burnadette Pop, MD [facility] P4/01/2024, 11:52 AM

## 2024-02-05 DIAGNOSIS — D61818 Other pancytopenia: Secondary | ICD-10-CM | POA: Diagnosis not present

## 2024-02-05 DIAGNOSIS — D649 Anemia, unspecified: Secondary | ICD-10-CM | POA: Diagnosis not present

## 2024-02-05 LAB — CBC WITH DIFFERENTIAL/PLATELET
Abs Immature Granulocytes: 0.01 10*3/uL (ref 0.00–0.07)
Basophils Absolute: 0 10*3/uL (ref 0.0–0.1)
Basophils Relative: 1 %
Eosinophils Absolute: 0.2 10*3/uL (ref 0.0–0.5)
Eosinophils Relative: 7 %
HCT: 27.6 % — ABNORMAL LOW (ref 39.0–52.0)
Hemoglobin: 8 g/dL — ABNORMAL LOW (ref 13.0–17.0)
Immature Granulocytes: 1 %
Lymphocytes Relative: 18 %
Lymphs Abs: 0.4 10*3/uL — ABNORMAL LOW (ref 0.7–4.0)
MCH: 25.6 pg — ABNORMAL LOW (ref 26.0–34.0)
MCHC: 29 g/dL — ABNORMAL LOW (ref 30.0–36.0)
MCV: 88.2 fL (ref 80.0–100.0)
Monocytes Absolute: 0.3 10*3/uL (ref 0.1–1.0)
Monocytes Relative: 12 %
Neutro Abs: 1.4 10*3/uL — ABNORMAL LOW (ref 1.7–7.7)
Neutrophils Relative %: 61 %
Platelets: 23 10*3/uL — CL (ref 150–400)
RBC: 3.13 MIL/uL — ABNORMAL LOW (ref 4.22–5.81)
RDW: 18 % — ABNORMAL HIGH (ref 11.5–15.5)
WBC: 2.2 10*3/uL — ABNORMAL LOW (ref 4.0–10.5)
nRBC: 0 % (ref 0.0–0.2)

## 2024-02-05 LAB — PREPARE PLATELET PHERESIS
Unit division: 0
Unit division: 0

## 2024-02-05 LAB — BPAM PLATELET PHERESIS
Blood Product Expiration Date: 202504042359
Blood Product Expiration Date: 202504042359
ISSUE DATE / TIME: 202504031009
ISSUE DATE / TIME: 202504031253
Unit Type and Rh: 5100
Unit Type and Rh: 7300

## 2024-02-05 LAB — GLUCOSE, CAPILLARY
Glucose-Capillary: 128 mg/dL — ABNORMAL HIGH (ref 70–99)
Glucose-Capillary: 232 mg/dL — ABNORMAL HIGH (ref 70–99)
Glucose-Capillary: 195 mg/dL — ABNORMAL HIGH (ref 70–99)
Glucose-Capillary: 255 mg/dL — ABNORMAL HIGH (ref 70–99)

## 2024-02-05 NOTE — Progress Notes (Signed)
 PROGRESS NOTE  Chase Reed  WUJ:[id number] DOB: [birth year] DOA: 02/01/2024 PCP: Courtney Heys, PA-C   Brief Narrative: Patient is a 58 year old male with history of chronic hepatitis C with cirrhosis, chronic pancytopenia, diabetes type 2, substance use, depression, anxiety who presented with chest pain, palpitations, exertional dyspnea, fatigue.  On presentation, his hemoglobin was found to be in the range of 5.  Given 3 units of  blood transfusion total.  Found to have low iron.  Hematology, gastroenterology consulted.  Plan for bone marrow biopsy by IR on Monday  Assessment & Plan:  Principal Problem:   Symptomatic anemia Active Problems:   Chronic hepatitis C with cirrhosis (HCC)   Pancytopenia (HCC)   Type 2 diabetes mellitus with diabetic polyneuropathy, with long-term current use of insulin (HCC)   Anemia  Acute on chronic iron deficiency anemia/pancytopenia/severe thrombocytopenia: Presented with hemoglobin in the range of 5.  Reported dyspnea on minimal exertion. has chronic anemia but his baseline hemoglobin is around 8-9.  No obvious source of bleeding.  No hematochezia or melena.  GI consulted.  No plan for endoscopy or colonoscopy. Currently on IV iron.  Received 3 units of blood transfusion during this hospitalization.  Hemoglobin today in the range of 8. S/P  2 units of platelets on 4/3.  Platelets are more than 20,000 today..  IR planning for bone marrow biopsy. Started on vitamin B 12 supplementation for low normal vitamin B12 His baseline platelets level are in the range of 30,000-40,000  Chronic hepatitis C with cirrhosis: Cirrhosis with no evidence of decompensation.  No ascites.  He says his hepatitis C is already treated.  Type 2 diabetes: Currently on sliding scale.  A1c of 5.4  AKI: Resolved  Depression/anxiety: Currently not taking any medications for mood  History of substance use disorder: Reports that he is sober from alcohol and drugs for 9  months           DVT prophylaxis:SCDs Start: 02/01/24 2123     Code Status: Full Code  Family Communication: None at bedside  Patient status:Inpatient  Patient is from :home  Anticipated discharge NF:AOZH  Estimated DC date:after BMB   Consultants: GI,hematology  Procedures:None yet  Antimicrobials:  Anti-infectives (From admission, onward)    None       Subjective: Patient seen and examined at bedside today.  Hemodynamically stable.  Comfortable lying in bed.  No new complaints.  He wants to go home and come back for bone  marrow biopsy on Monday.After  discussion with radiology ,we found that ,it is  not possible  Objective: Vitals:   02/04/24 1313 02/04/24 1508 02/04/24 1956 02/05/24 0500  BP: (!) 140/84 137/82 (!) 145/97 119/77  Pulse: 76 72 80 73  Resp: 18 18 20 20   Temp: 98.1 F (36.7 C) 97.9 F (36.6 C) 98 F (36.7 C) 98 F (36.7 C)  TempSrc: Oral Oral  Oral  SpO2: 98% 98% 100% 96%  Weight:      Height:        Intake/Output Summary (Last 24 hours) at 02/05/2024 1100 Last data filed at 02/05/2024 0650 Gross per 24 hour  Intake 626 ml  Output --  Net 626 ml   Filed Weights   02/01/24 2040  Weight: 97.5 kg    Examination:  General exam: Overall comfortable, not in distress HEENT: PERRL Respiratory system:  no wheezes or crackles  Cardiovascular system: S1 & S2 heard, RRR.  Gastrointestinal system: Abdomen is mildly distended, soft and nontender.  Central nervous system: Alert and oriented Extremities: trace lower extremity edema, no clubbing ,no cyanosis Skin: No rashes, no ulcers,no icterus     Data Reviewed: I have personally reviewed following labs and imaging studies  CBC: Recent Labs  Lab 02/01/24 1735 02/02/24 0800 02/02/24 1854 02/03/24 0508 02/04/24 0459 02/05/24 0505  WBC 1.8* 1.6*  --  1.8* 2.2* 2.2*  NEUTROABS  --  1.0*  --   --   --  1.4*  HGB 5.1* 6.0* 7.5* 7.0* 8.2* 8.0*  HCT 18.9* 21.0* 25.0* 23.6* 27.4*  27.6*  MCV 84.4 87.1  --  85.8 87.0 88.2  PLT 27* 21*  --  17* 18* 23*   Basic Metabolic Panel: Recent Labs  Lab 02/01/24 1733 02/01/24 1735 02/02/24 0800 02/03/24 0508  NA 138  --  134* 137  K 4.1  --  3.6 4.2  CL 107  --  106 109  CO2 22  --  20* 20*  GLUCOSE 145*  --  184* 116*  BUN 22*  --  17 15  CREATININE 1.28*  --  1.14 0.93  CALCIUM 8.8*  --  8.1* 8.4*  MG  --  1.8  --   --      Recent Results (from the past 240 hours)  MRSA Next Gen by PCR, Nasal     Status: Abnormal   Collection Time: 02/02/24  3:00 AM   Specimen: Nasal Mucosa; Nasal Swab  Result Value Ref Range Status   MRSA by PCR Next Gen DETECTED (A) NOT DETECTED Final    Comment: (NOTE) The GeneXpert MRSA Assay (FDA approved for NASAL specimens only), is one component of a comprehensive MRSA colonization surveillance program. It is not intended to diagnose MRSA infection nor to guide or monitor treatment for MRSA infections. Test performance is not FDA approved in patients less than 47 years old. Performed at [facility], [address], [place], [place] [address]      Radiology Studies: No results found.   Scheduled Meds:  Chlorhexidine Gluconate Cloth  6 each Topical Daily   vitamin B-12  1,000 mcg Oral Daily   insulin aspart  0-5 Units Subcutaneous QHS   insulin aspart  0-9 Units Subcutaneous TID WC   mupirocin ointment  1 Application Nasal BID   sodium chloride flush  3 mL Intravenous Q12H   Continuous Infusions:  iron sucrose 200 mg (02/05/24 0924)     LOS: 3 days   Burnadette Pop, MD [facility] P4/02/2024, 11:00 AM

## 2024-02-05 NOTE — Progress Notes (Signed)
 Chart reviewed.  Bone marrow biopsy planned.  [facility] will revisit Monday; based on bone marrow biopsy results, we can decide whether or not to pursue endoscopy or other further evaluation.

## 2024-02-05 NOTE — Progress Notes (Signed)
 Chase Reed   DOB:February 16, 1966   VW#:[id number]      ASSESSMENT & PLAN:  Pancytopenia: Anemia - Hemoglobin stable 8.0 today, post PRBC transfusions.   - Hemoglobin 5.1 on admission 02/01/2024. - Baseline hemoglobin appears to be in the 9-10 range. - Recommend PRBC transfusion for Hgb <7.0. - Patient seen in the past by hematology several years ago and has been lost to follow-up. - Patient denies any active bleeding, epistaxis, dark stool or other GI blood loss.  FOBT negative. - GI evaluation done, EGD possible this admission.  GI decision pending marrow biopsy. - Findings concerning for a bleed which would explain drop in iron as well as low hemoglobin.  Unclear where bleeding is coming from as patient denies nosebleeds or gum bleeds or blood in urine and stools. - CT-guided bone marrow biopsy ordered due to worsening cytopenias, now scheduled for 02/08/2024.  Patient is agreeable.  - IV iron with Venofer 200 mg every other day x 5 doses ordered, initiated 02/03/2024, tolerating well.  Recommend continuation as outpatient even if patient is discharged prior to completion of therapy.  - Hematology/Dr. Leonides Schanz following.   Thrombocytopenia - Platelets low 23K today.  Status post 1 unit platelet transfusion 4/3.   - Baseline platelets in the mid 30s to 40 range.  Indeed patient reports that his platelets have not been over 40K in 10 years. - Recommend transfuse platelets for counts <10K or <50 K with acute bleeding. - Patient denies active bleeding at this time.   Leukopenia - WBC stable, low at 2.2 - Baseline WBC in the upper 1 range - No transfusional intervention recommended at this time   Fatigue Dyspnea on exertion - Stable, states he does not feel any different. - Reports 3-week history of worsening fatigue and dyspnea on exertion - Likely due to to anemia - Continue supportive care   History of cirrhosis Hepatitis C -Patient states he was diagnosed with cirrhosis over 20 years  ago. -Noted elevated LFTs 1 year ago 08/27/2022. - LFTs currently WNL with total bili WNL - Continue to monitor CMP   Diabetes -Reports he is not on regular diabetic medication or insulin.  However he has been using sliding scale insulin to monitor himself at home. - Continue to monitor blood glucose levels       Code Status Full  Subjective:  Patient seen awake and alert laying in bed.  Reports he feels "the same" and that he really does not feel any different.  Discussed with patient that bone marrow is now scheduled for Monday.  No other acute distress is noted.  Objective:  Vitals:   02/04/24 1956 02/05/24 0500  BP: (!) 145/97 119/77  Pulse: 80 73  Resp: 20 20  Temp: 98 F (36.7 C) 98 F (36.7 C)  SpO2: 100% 96%     Intake/Output Summary (Last 24 hours) at 02/05/2024 8295 Last data filed at 02/05/2024 6213 Gross per 24 hour  Intake 626 ml  Output --  Net 626 ml     REVIEW OF SYSTEMS:   Constitutional: Denies fevers, chills or abnormal night sweats Eyes: Denies blurriness of vision, double vision or watery eyes Ears, nose, mouth, throat, and face: Denies mucositis or sore throat Respiratory: Denies cough, dyspnea or wheezes Cardiovascular: Denies palpitation, chest discomfort or lower extremity swelling Gastrointestinal:  Denies nausea, heartburn or change in bowel habits Skin: Denies abnormal skin rashes Lymphatics: Denies new lymphadenopathy or easy bruising Neurological: Denies numbness, tingling or new weaknesses Behavioral/Psych:  Mood is stable, no new changes  All other systems were reviewed with the patient and are negative.  PHYSICAL EXAMINATION: ECOG PERFORMANCE STATUS: 2 - Symptomatic, <50% confined to bed  Vitals:   02/04/24 1956 02/05/24 0500  BP: (!) 145/97 119/77  Pulse: 80 73  Resp: 20 20  Temp: 98 F (36.7 C) 98 F (36.7 C)  SpO2: 100% 96%   Filed Weights   02/01/24 2040  Weight: 215 lb (97.5 kg)    GENERAL: alert, no distress and  comfortable SKIN: skin color, texture, turgor are normal, no rashes or significant lesions EYES: normal, conjunctiva are pink and non-injected, sclera clear OROPHARYNX: no exudate, no erythema and lips, buccal mucosa, and tongue normal  NECK: supple, thyroid normal size, non-tender, without nodularity LYMPH: no palpable lymphadenopathy in the cervical, axillary or inguinal LUNGS: clear to auscultation and percussion with normal breathing effort HEART: regular rate & rhythm and no murmurs and no lower extremity edema ABDOMEN: abdomen soft, non-tender and normal bowel sounds MUSCULOSKELETAL: no cyanosis of digits and no clubbing  PSYCH: alert & oriented x 3 with fluent speech NEURO: no focal motor/sensory deficits   All questions were answered. The patient knows to call the clinic with any problems, questions or concerns.   The total time spent in the appointment was 40 minutes encounter with patient including review of chart and various tests results, discussions about plan of care and coordination of care plan  Dawson Bills, NP 02/05/2024 9:54 AM    Labs Reviewed:  Lab Results  Component Value Date   WBC 2.2 (L) 02/05/2024   HGB 8.0 (L) 02/05/2024   HCT 27.6 (L) 02/05/2024   MCV 88.2 02/05/2024   PLT 23 (LL) 02/05/2024   Recent Labs    02/01/24 1733 02/02/24 0800 02/03/24 0508  NA 138 134* 137  K 4.1 3.6 4.2  CL 107 106 109  CO2 22 20* 20*  GLUCOSE 145* 184* 116*  BUN 22* 17 15  CREATININE 1.28* 1.14 0.93  CALCIUM 8.8* 8.1* 8.4*  GFRNONAA >60 >60 >60  PROT 8.0 6.7 6.4*  ALBUMIN 4.7 3.5 3.2*  AST 33 27 29  ALT 24 23 24   ALKPHOS 97 81 76  BILITOT 0.5 0.5 0.8    Studies Reviewed:  DG Chest 2 View Result Date: 02/01/2024 CLINICAL DATA:  Chest pain EXAM: CHEST - 2 VIEW COMPARISON:  Chest x-ray 10/18/2021 FINDINGS: There are increased central interstitial markings. There is no focal lung infiltrate, pleural effusion or pneumothorax. The cardiomediastinal silhouette is  within normal limits. No acute fractures are seen. IMPRESSION: Increased central interstitial markings may reflect bronchitis or atypical pneumonia. Electronically Signed   By: Darliss Cheney M.D.   On: 02/01/2024 17:55

## 2024-02-05 NOTE — Progress Notes (Signed)
 Patient ID: Chase Reed, male   DOB: [birth year], 58 y.o.   MRN: [id number] Pt was tent scheduled for CT guided bone marrow biopsy today; however, pathology dept cannot accommodate case today so procedure has tent been rescheduled for 4/7.

## 2024-02-06 DIAGNOSIS — D649 Anemia, unspecified: Secondary | ICD-10-CM | POA: Diagnosis not present

## 2024-02-06 LAB — CBC
HCT: 30.9 % — ABNORMAL LOW (ref 39.0–52.0)
Hemoglobin: 8.7 g/dL — ABNORMAL LOW (ref 13.0–17.0)
MCH: 25.6 pg — ABNORMAL LOW (ref 26.0–34.0)
MCHC: 28.2 g/dL — ABNORMAL LOW (ref 30.0–36.0)
MCV: 90.9 fL (ref 80.0–100.0)
Platelets: 23 10*3/uL — CL (ref 150–400)
RBC: 3.4 MIL/uL — ABNORMAL LOW (ref 4.22–5.81)
RDW: 19.1 % — ABNORMAL HIGH (ref 11.5–15.5)
WBC: 2.7 10*3/uL — ABNORMAL LOW (ref 4.0–10.5)
nRBC: 0 % (ref 0.0–0.2)

## 2024-02-06 LAB — GLUCOSE, CAPILLARY
Glucose-Capillary: 158 mg/dL — ABNORMAL HIGH (ref 70–99)
Glucose-Capillary: 148 mg/dL — ABNORMAL HIGH (ref 70–99)
Glucose-Capillary: 224 mg/dL — ABNORMAL HIGH (ref 70–99)
Glucose-Capillary: 195 mg/dL — ABNORMAL HIGH (ref 70–99)

## 2024-02-06 NOTE — Progress Notes (Signed)
 PROGRESS NOTE  Chase Reed  ZOX:[id number] DOB: 04/03/1966 DOA: 02/01/2024 PCP: Courtney Heys, PA-C   Brief Narrative: Patient is a 58 year old male with history of chronic hepatitis C with cirrhosis, chronic pancytopenia, diabetes type 2, substance use, depression, anxiety who presented with chest pain, palpitations, exertional dyspnea, fatigue.  On presentation, his hemoglobin was found to be in the range of 5.  Given 3 units of  blood transfusion total.  Found to have low iron.  Hematology, gastroenterology consulted.  Plan for bone marrow biopsy by IR on Monday  Assessment & Plan:  Principal Problem:   Symptomatic anemia Active Problems:   Chronic hepatitis C with cirrhosis (HCC)   Pancytopenia (HCC)   Type 2 diabetes mellitus with diabetic polyneuropathy, with long-term current use of insulin (HCC)   Anemia  Acute on chronic iron deficiency anemia/pancytopenia/severe thrombocytopenia: Presented with hemoglobin in the range of 5.  Reported dyspnea on minimal exertion. has chronic anemia but his baseline hemoglobin is around 8-9.  No obvious source of bleeding.  No hematochezia or melena.  GI consulted.  No plan for endoscopy or colonoscopy. Currently on IV iron.  Received 3 units of blood transfusion during this hospitalization.  Hemoglobin today in the range of 8. S/P  2 units of platelets on 4/3.  Platelets are more than 20,000 today.  IR planning for bone marrow biopsy. Started on vitamin B 12 supplementation for low normal vitamin B12 His baseline platelets level are in the range of 30,000-40,000  Chronic hepatitis C with cirrhosis: Cirrhosis with no evidence of decompensation.  No ascites.  He says his hepatitis C is already treated.  Type 2 diabetes: Currently on sliding scale.  A1c of 5.4  AKI: Resolved  Depression/anxiety: Currently not taking any medications for mood  History of substance use disorder: Reports that he is sober from alcohol and drugs for 9  months           DVT prophylaxis:SCDs Start: 02/01/24 2123     Code Status: Full Code  Family Communication: None at bedside  Patient status:Inpatient  Patient is from :home  Anticipated discharge WJ:XBJY  Estimated DC date:after BMB   Consultants: GI,hematology  Procedures:None yet  Antimicrobials:  Anti-infectives (From admission, onward)    None       Subjective: Patient seen and examined at the bedside today.  Hemodynamically stable.  Comfortable.  Lying in bed.  Denies new complaints  Objective: Vitals:   02/05/24 0500 02/05/24 1238 02/05/24 2131 02/06/24 0407  BP: 119/77 130/85 130/81 (!) 134/94  Pulse: 73 72 76 73  Resp: 20 18 (!) 22 18  Temp: 98 F (36.7 C) 98 F (36.7 C) 98.1 F (36.7 C) 98.1 F (36.7 C)  TempSrc: Oral Oral Oral Oral  SpO2: 96% 97% 96% 98%  Weight:      Height:        Intake/Output Summary (Last 24 hours) at 02/06/2024 1111 Last data filed at 02/05/2024 2100 Gross per 24 hour  Intake 240 ml  Output --  Net 240 ml   Filed Weights   02/01/24 2040  Weight: 97.5 kg    Examination:  General exam: Overall comfortable, not in distress HEENT: PERRL Respiratory system:  no wheezes or crackles  Cardiovascular system: S1 & S2 heard, RRR.  Gastrointestinal system: Abdomen is mildly distended, soft and nontender. Central nervous system: Alert and oriented Extremities: trace lower extremity edema, no clubbing ,no cyanosis Skin: No rashes, no ulcers,no icterus     Data  Reviewed: I have personally reviewed following labs and imaging studies  CBC: Recent Labs  Lab 02/02/24 0800 02/02/24 1854 02/03/24 0508 02/04/24 0459 02/05/24 0505 02/06/24 0828  WBC 1.6*  --  1.8* 2.2* 2.2* 2.7*  NEUTROABS 1.0*  --   --   --  1.4*  --   HGB 6.0* 7.5* 7.0* 8.2* 8.0* 8.7*  HCT 21.0* 25.0* 23.6* 27.4* 27.6* 30.9*  MCV 87.1  --  85.8 87.0 88.2 90.9  PLT 21*  --  17* 18* 23* 23*   Basic Metabolic Panel: Recent Labs  Lab  02/01/24 1733 02/01/24 1735 02/02/24 0800 02/03/24 0508  NA 138  --  134* 137  K 4.1  --  3.6 4.2  CL 107  --  106 109  CO2 22  --  20* 20*  GLUCOSE 145*  --  184* 116*  BUN 22*  --  17 15  CREATININE 1.28*  --  1.14 0.93  CALCIUM 8.8*  --  8.1* 8.4*  MG  --  1.8  --   --      Recent Results (from the past 240 hours)  MRSA Next Gen by PCR, Nasal     Status: Abnormal   Collection Time: 02/02/24  3:00 AM   Specimen: Nasal Mucosa; Nasal Swab  Result Value Ref Range Status   MRSA by PCR Next Gen DETECTED (A) NOT DETECTED Final    Comment: (NOTE) The GeneXpert MRSA Assay (FDA approved for NASAL specimens only), is one component of a comprehensive MRSA colonization surveillance program. It is not intended to diagnose MRSA infection nor to guide or monitor treatment for MRSA infections. Test performance is not FDA approved in patients less than 76 years old. Performed at [facility], [address] [place], [place] [address]      Radiology Studies: No results found.   Scheduled Meds:  Chlorhexidine Gluconate Cloth  6 each Topical Daily   vitamin B-12  1,000 mcg Oral Daily   insulin aspart  0-5 Units Subcutaneous QHS   insulin aspart  0-9 Units Subcutaneous TID WC   mupirocin ointment  1 Application Nasal BID   sodium chloride flush  3 mL Intravenous Q12H   Continuous Infusions:  iron sucrose 200 mg (02/05/24 0924)     LOS: 4 days   Burnadette Pop, MD [facility] P4/03/2024, 11:11 AM

## 2024-02-06 NOTE — Plan of Care (Signed)
 ?  Problem: Coping: ?Goal: Level of anxiety will decrease ?Outcome: Progressing ?  ?Problem: Safety: ?Goal: Ability to remain free from injury will improve ?Outcome: Progressing ?  ?

## 2024-02-07 DIAGNOSIS — D649 Anemia, unspecified: Secondary | ICD-10-CM | POA: Diagnosis not present

## 2024-02-07 LAB — GLUCOSE, CAPILLARY
Glucose-Capillary: 131 mg/dL — ABNORMAL HIGH (ref 70–99)
Glucose-Capillary: 244 mg/dL — ABNORMAL HIGH (ref 70–99)
Glucose-Capillary: 131 mg/dL — ABNORMAL HIGH (ref 70–99)
Glucose-Capillary: 245 mg/dL — ABNORMAL HIGH (ref 70–99)

## 2024-02-07 NOTE — Plan of Care (Signed)
  Problem: Activity: Goal: Risk for activity intolerance will decrease Outcome: Progressing   Problem: Safety: Goal: Ability to remain free from injury will improve Outcome: Progressing   

## 2024-02-07 NOTE — Progress Notes (Signed)
 PROGRESS NOTE  Chase Reed  ZOX:[id number] DOB: 03-05-1966 DOA: 02/01/2024 PCP: Courtney Heys, PA-C   Brief Narrative: Patient is a 58 year old male with history of chronic hepatitis C with cirrhosis, chronic pancytopenia, diabetes type 2, substance use, depression, anxiety who presented with chest pain, palpitations, exertional dyspnea, fatigue.  On presentation, his hemoglobin was found to be in the range of 5.  Given 3 units of  blood transfusion total.  Found to have low iron.  Hematology, gastroenterology consulted.  Plan for bone marrow biopsy by IR on Monday  Assessment & Plan:  Principal Problem:   Symptomatic anemia Active Problems:   Chronic hepatitis C with cirrhosis (HCC)   Pancytopenia (HCC)   Type 2 diabetes mellitus with diabetic polyneuropathy, with long-term current use of insulin (HCC)   Anemia  Acute on chronic iron deficiency anemia/pancytopenia/severe thrombocytopenia: Presented with hemoglobin in the range of 5.  Reported dyspnea on minimal exertion. has chronic anemia but his baseline hemoglobin is around 8-9.  No obvious source of bleeding.  No hematochezia or melena.  GI consulted.  No plan for endoscopy or colonoscopy. Currently on IV iron.  Received 3 units of blood transfusion during this hospitalization.  Hemoglobin today in the range of 8. S/P  2 units of platelets on 4/3.  Platelets are more than 20,000 today.  IR planning for bone marrow biopsy. Started on vitamin B 12 supplementation for low normal vitamin B12 His baseline platelets level are in the range of 30,000-40,000  Chronic hepatitis C with cirrhosis: Cirrhosis with no evidence of decompensation.  No ascites.  He says his hepatitis C is already treated.  Type 2 diabetes: Currently on sliding scale.  A1c of 5.4  AKI: Resolved  Depression/anxiety: Currently not taking any medications for mood  History of substance use disorder: Reports that he is sober from alcohol and drugs for 9  months           DVT prophylaxis:SCDs Start: 02/01/24 2123     Code Status: Full Code  Family Communication: None at bedside  Patient status:Inpatient  Patient is from :home  Anticipated discharge WJ:XBJY  Estimated DC date:after BMB   Consultants: GI,hematology  Procedures:None yet  Antimicrobials:  Anti-infectives (From admission, onward)    None       Subjective: Patient seen and examined at bedside today.  Hemodynamically stable.  Lying comfortably in bed.  No new complaints.  Plan for bone marrow biopsy tomorrow  Objective: Vitals:   02/06/24 0407 02/06/24 1326 02/06/24 2057 02/07/24 0442  BP: (!) 134/94 130/82 135/83 129/87  Pulse: 73 74 73 67  Resp: 18  20 18   Temp: 98.1 F (36.7 C) 98.2 F (36.8 C) 98.2 F (36.8 C) 98.2 F (36.8 C)  TempSrc: Oral Oral Oral Oral  SpO2: 98% 95% 97% 96%  Weight:      Height:       No intake or output data in the 24 hours ending 02/07/24 1115  Filed Weights   02/01/24 2040  Weight: 97.5 kg    Examination:   General exam: Overall comfortable, not in distress HEENT: PERRL Respiratory system:  no wheezes or crackles  Cardiovascular system: S1 & S2 heard, RRR.  Gastrointestinal system: Abdomen is mildly distended, soft and nontender. Central nervous system: Alert and oriented Extremities: No edema, no clubbing ,no cyanosis Skin: No rashes, no ulcers,no icterus      Data Reviewed: I have personally reviewed following labs and imaging studies  CBC: Recent Labs  Lab 02/02/24 0800 02/02/24 1854 02/03/24 0508 02/04/24 0459 02/05/24 0505 02/06/24 0828  WBC 1.6*  --  1.8* 2.2* 2.2* 2.7*  NEUTROABS 1.0*  --   --   --  1.4*  --   HGB 6.0* 7.5* 7.0* 8.2* 8.0* 8.7*  HCT 21.0* 25.0* 23.6* 27.4* 27.6* 30.9*  MCV 87.1  --  85.8 87.0 88.2 90.9  PLT 21*  --  17* 18* 23* 23*   Basic Metabolic Panel: Recent Labs  Lab 02/01/24 1733 02/01/24 1735 02/02/24 0800 02/03/24 0508  NA 138  --  134* 137  K 4.1   --  3.6 4.2  CL 107  --  106 109  CO2 22  --  20* 20*  GLUCOSE 145*  --  184* 116*  BUN 22*  --  17 15  CREATININE 1.28*  --  1.14 0.93  CALCIUM 8.8*  --  8.1* 8.4*  MG  --  1.8  --   --      Recent Results (from the past 240 hours)  MRSA Next Gen by PCR, Nasal     Status: Abnormal   Collection Time: 02/02/24  3:00 AM   Specimen: Nasal Mucosa; Nasal Swab  Result Value Ref Range Status   MRSA by PCR Next Gen DETECTED (A) NOT DETECTED Final    Comment: (NOTE) The GeneXpert MRSA Assay (FDA approved for NASAL specimens only), is one component of a comprehensive MRSA colonization surveillance program. It is not intended to diagnose MRSA infection nor to guide or monitor treatment for MRSA infections. Test performance is not FDA approved in patients less than 16 years old. Performed at [facility], [address], [place], [place] [address]      Radiology Studies: No results found.   Scheduled Meds:  Chlorhexidine Gluconate Cloth  6 each Topical Daily   vitamin B-12  1,000 mcg Oral Daily   insulin aspart  0-5 Units Subcutaneous QHS   insulin aspart  0-9 Units Subcutaneous TID WC   mupirocin ointment  1 Application Nasal BID   sodium chloride flush  3 mL Intravenous Q12H   Continuous Infusions:  iron sucrose 200 mg (02/05/24 0924)     LOS: 5 days   Burnadette Pop, MD [facility] P4/04/2024, 11:15 AM

## 2024-02-07 NOTE — Plan of Care (Signed)
  Problem: Education: Goal: Knowledge of General Education information will improve Description: Including pain rating scale, medication(s)/side effects and non-pharmacologic comfort measures Outcome: Progressing   Problem: Clinical Measurements: Goal: Ability to maintain clinical measurements within normal limits will improve Outcome: Progressing   Problem: Coping: Goal: Level of anxiety will decrease Outcome: Progressing   

## 2024-02-08 ENCOUNTER — Other Ambulatory Visit (HOSPITAL_COMMUNITY): Payer: Self-pay

## 2024-02-08 ENCOUNTER — Inpatient Hospital Stay (HOSPITAL_COMMUNITY): Payer: MEDICAID

## 2024-02-08 DIAGNOSIS — D649 Anemia, unspecified: Secondary | ICD-10-CM | POA: Diagnosis not present

## 2024-02-08 LAB — CBC WITH DIFFERENTIAL/PLATELET
Abs Immature Granulocytes: 0.02 10*3/uL (ref 0.00–0.07)
Basophils Absolute: 0 10*3/uL (ref 0.0–0.1)
Basophils Relative: 1 %
Eosinophils Absolute: 0.2 10*3/uL (ref 0.0–0.5)
Eosinophils Relative: 9 %
HCT: 33.4 % — ABNORMAL LOW (ref 39.0–52.0)
Hemoglobin: 9.7 g/dL — ABNORMAL LOW (ref 13.0–17.0)
Immature Granulocytes: 1 %
Lymphocytes Relative: 21 %
Lymphs Abs: 0.5 10*3/uL — ABNORMAL LOW (ref 0.7–4.0)
MCH: 26.2 pg (ref 26.0–34.0)
MCHC: 29 g/dL — ABNORMAL LOW (ref 30.0–36.0)
MCV: 90.3 fL (ref 80.0–100.0)
Monocytes Absolute: 0.2 10*3/uL (ref 0.1–1.0)
Monocytes Relative: 10 %
Neutro Abs: 1.3 10*3/uL — ABNORMAL LOW (ref 1.7–7.7)
Neutrophils Relative %: 58 %
Platelets: 26 10*3/uL — CL (ref 150–400)
RBC: 3.7 MIL/uL — ABNORMAL LOW (ref 4.22–5.81)
RDW: 20.2 % — ABNORMAL HIGH (ref 11.5–15.5)
WBC: 2.2 10*3/uL — ABNORMAL LOW (ref 4.0–10.5)
nRBC: 0 % (ref 0.0–0.2)

## 2024-02-08 LAB — GLUCOSE, CAPILLARY
Glucose-Capillary: 128 mg/dL — ABNORMAL HIGH (ref 70–99)
Glucose-Capillary: 124 mg/dL — ABNORMAL HIGH (ref 70–99)

## 2024-02-08 MED ORDER — DIPHENHYDRAMINE HCL 50 MG/ML IJ SOLN
INTRAMUSCULAR | Status: AC
Start: 2024-02-08 — End: ?
  Filled 2024-02-08: qty 1

## 2024-02-08 MED ORDER — MIDAZOLAM HCL 2 MG/2ML IJ SOLN
INTRAMUSCULAR | Status: AC | PRN
  Administered 2024-02-08: 1 mg via INTRAVENOUS
  Administered 2024-02-08: 2 mg via INTRAVENOUS
  Administered 2024-02-08: 1 mg via INTRAVENOUS

## 2024-02-08 MED ORDER — FERROUS SULFATE 325 (65 FE) MG PO TABS
325.0000 mg | ORAL_TABLET | Freq: Two times a day (BID) | ORAL | 1 refills | Status: DC
  Filled 2024-02-08: qty 60, 30d supply, fill #0

## 2024-02-08 MED ORDER — CYANOCOBALAMIN 1000 MCG PO TABS
1000.0000 ug | ORAL_TABLET | Freq: Every day | ORAL | 0 refills | Status: AC
  Filled 2024-02-08: qty 30, 30d supply, fill #0

## 2024-02-08 MED ORDER — DIPHENHYDRAMINE HCL 50 MG/ML IJ SOLN
INTRAMUSCULAR | Status: AC | PRN
  Administered 2024-02-08: 25 mg via INTRAVENOUS

## 2024-02-08 MED ORDER — MIDAZOLAM HCL 2 MG/2ML IJ SOLN
INTRAMUSCULAR | Status: AC
  Filled 2024-02-08: qty 4

## 2024-02-08 MED ORDER — FENTANYL CITRATE (PF) 100 MCG/2ML IJ SOLN
INTRAMUSCULAR | Status: AC | PRN
  Administered 2024-02-08 (×2): 50 ug via INTRAVENOUS

## 2024-02-08 MED ORDER — FENTANYL CITRATE (PF) 100 MCG/2ML IJ SOLN
INTRAMUSCULAR | Status: AC
  Filled 2024-02-08: qty 2

## 2024-02-08 NOTE — Procedures (Signed)
 Vascular and Interventional Radiology Procedure Note  Patient: Chase Reed DOB: 12/14/1965 Medical Record Number: [id number] Note Date/Time: 02/08/24 9:22 AM   Performing Physician: Roanna Banning, MD Assistant(s): None  Diagnosis: Pancytopenia  Procedure: BONE MARROW ASPIRATION and BIOPSY  Anesthesia: Conscious Sedation Complications: None Estimated Blood Loss: Minimal Specimens: Sent for Pathology  Findings:  Successful CT-guided bone marrow aspiration and biopsy A total of 2 cores were obtained. Hemostasis of the tract was achieved using Manual Pressure.  Plan: Bed rest for 1 hours.  See detailed procedure note with images in PACS. The patient tolerated the procedure well without incident or complication and was returned to Recovery in stable condition.    Roanna Banning, MD [facility]   Pager. [phone] Clinic. [phone]

## 2024-02-08 NOTE — Discharge Summary (Signed)
 Physician Discharge Summary  Chase Reed ZOX:[id number] DOB: [birth year] DOA: 02/01/2024  PCP: Courtney Heys, PA-C  Admit date: 02/01/2024 Discharge date: 02/08/2024  Admitted From: Home Disposition:  Home  Discharge Condition:Stable CODE STATUS:FULL Diet recommendation: Low sodium  Brief/Interim Summary: Patient is a 58 year old male with history of chronic hepatitis C with cirrhosis, chronic pancytopenia, diabetes type 2, substance use, depression, anxiety who presented with chest pain, palpitations, exertional dyspnea, fatigue. On presentation, his hemoglobin was found to be in the range of 5.  Given PRBC, platelets transfusion. found to have low iron. Hematology, gastroenterology consulted. S/p bone marrow biopsy by IR today.  Hematology cleared for discharge and they will follow him as an outpatient.  Medically stable for discharge home today  Following problems were addressed during the hospitalization:    Acute on chronic iron deficiency anemia/pancytopenia/severe thrombocytopenia: Presented with hemoglobin in the range of 5.  Reported dyspnea on minimal exertion. has chronic anemia but his baseline hemoglobin is around 8-9.  No obvious source of bleeding.  No hematochezia or melena.  GI consulted.  No plan for endoscopy or colonoscopy. Given IV iron.  Received 3 units of blood transfusion during this hospitalization.  Hemoglobin today in the range of 8. S/P  2 units of platelets on 4/3.  Platelets are more than 20,000 today.  IR did bone marrow biopsy. Started on vitamin B 12 supplementation for low normal vitamin B12 His baseline platelets level are in the range of 30,000-40,000. Discussed with hematology, he can be discharged today, he will be followed as an outpatient   Chronic hepatitis C with cirrhosis: Cirrhosis with no evidence of decompensation.  No ascites.  He says his hepatitis C is already treated.   Type 2 diabetes: Currently on sliding scale.  A1c of 5.4.   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    Discharge Diagnoses:  Principal Problem:   Symptomatic anemia Active Problems:   Chronic hepatitis C with cirrhosis (HCC)   Pancytopenia (HCC)   Type 2 diabetes mellitus with diabetic polyneuropathy, with long-term current use of insulin (HCC)   Anemia    Discharge Instructions  Discharge Instructions     Diet - low sodium heart healthy   Complete by: As directed    Discharge instructions   Complete by: As directed    1)Please take prescribed medications as instructed 2) You will be called by hematology for follow-up appointment   Increase activity slowly   Complete by: As directed    No wound care   Complete by: As directed       Allergies as of 02/08/2024       Reactions   Atorvastatin Other (See Comments)   Leg cramps and body aches        Medication List     STOP taking these medications    insulin glargine-yfgn 100 UNIT/ML Pen Commonly known as: SEMGLEE   Januvia 100 MG tablet Generic drug: sitaGLIPtin       TAKE these medications    cyanocobalamin 1000 MCG tablet Take 1 tablet (1,000 mcg total) by mouth daily. Start taking on: February 09, 2024   ferrous sulfate 325 (65 FE) MG EC tablet Take 1 tablet (325 mg total) by mouth 2 (two) times daily.   gabapentin 600 MG tablet  Commonly known as: NEURONTIN Take 600 mg by mouth 4 (four) times daily.   insulin aspart 100 UNIT/ML injection Commonly known as: novoLOG Inject 2-8 Units into the skin See admin instructions. Take 2-8 units as needed  Sliding scale.over 250--2 units over 300--4 units over 350--6 units over 400--8 units Up to 50 units daily: For diabetes management What changed: additional instructions   potassium chloride 10 MEQ tablet Commonly  known as: KLOR-CON Take 1 tablet (10 mEq total) by mouth as needed. For severe leg cramps.   sertraline 100 MG tablet Commonly known as: ZOLOFT Take 100 mg by mouth daily.        Allergies  Allergen Reactions   Atorvastatin Other (See Comments)    Leg cramps and body aches    Consultations: Hematology,IR,GI   Procedures/Studies: DG Chest 2 View Result Date: 02/01/2024 CLINICAL DATA:  Chest pain EXAM: CHEST - 2 VIEW COMPARISON:  Chest x-ray 10/18/2021 FINDINGS: There are increased central interstitial markings. There is no focal lung infiltrate, pleural effusion or pneumothorax. The cardiomediastinal silhouette is within normal limits. No acute fractures are seen. IMPRESSION: Increased central interstitial markings may reflect bronchitis or atypical pneumonia. Electronically Signed   By: Darliss Cheney M.D.   On: 02/01/2024 17:55      Subjective:  Patient seen and examined at bedside today.  Hemodynamically stable and comfortable.  Not in any kind of distress.  Medically stable for discharge.  discharge Exam: Vitals:   02/08/24 0945 02/08/24 1010  BP: 127/82 114/78  Pulse: 71 68  Resp: 11 12  Temp:  99.3 F (37.4 C)  SpO2: 100% 95%   Vitals:   02/08/24 0935 02/08/24 0940 02/08/24 0945 02/08/24 1010  BP: (!) 128/93 (!) 130/92 127/82 114/78  Pulse: 71 73 71 68  Resp: 11 11 11 12   Temp:    99.3 F (37.4 C)  TempSrc:    Oral  SpO2: 100% 100% 100% 95%  Weight:      Height:        General: Pt is alert, awake, not in acute distress Cardiovascular: RRR, S1/S2 +, no rubs, no gallops Respiratory: CTA bilaterally, no wheezing, no rhonchi Abdominal: Soft, NT, ND, bowel sounds + Extremities: no edema, no cyanosis    The results of significant diagnostics from this hospitalization (including imaging, microbiology, ancillary and laboratory) are listed below for reference.     Microbiology: Recent Results (from the past 240 hours)  MRSA Next Gen by PCR, Nasal      Status: Abnormal   Collection Time: 02/02/24  3:00 AM   Specimen: Nasal Mucosa; Nasal Swab  Result Value Ref Range Status   MRSA by PCR Next Gen DETECTED (A) NOT DETECTED Final    Comment: (NOTE) The GeneXpert MRSA Assay (FDA approved for NASAL specimens only), is one component of a comprehensive MRSA colonization surveillance program. It is not intended to diagnose MRSA infection nor to guide or monitor treatment for MRSA infections. Test performance is not FDA approved in patients less than 27 years old. Performed at [facility] [address], [place], [place] [address]      Labs: BNP (last 3 results) No results for input(s): "BNP" in the last 8760 hours. Basic Metabolic Panel: Recent Labs  Lab 02/01/24 1733 02/01/24 1735 02/02/24 0800 02/03/24 0508  NA 138  --  134* 137  K 4.1  --  3.6 4.2  CL 107  --  106 109  CO2 22  --  20* 20*  GLUCOSE 145*  --  184* 116*  BUN 22*  --  17 15  CREATININE 1.28*  --  1.14 0.93  CALCIUM 8.8*  --  8.1* 8.4*  MG  --  1.8  --   --    Liver Function Tests: Recent Labs  Lab 02/01/24 1733 02/02/24 0800 02/03/24 0508  AST 33 27 29  ALT 24 23 24   ALKPHOS 97 81 76  BILITOT 0.5 0.5 0.8  PROT 8.0 6.7 6.4*  ALBUMIN 4.7 3.5 3.2*   No results for input(s): "LIPASE", "AMYLASE" in the last 168 hours. No results for input(s): "AMMONIA" in the last 168 hours. CBC: Recent Labs  Lab 02/02/24 0800 02/02/24 1854 02/03/24 0508 02/04/24 0459 02/05/24 0505 02/06/24 0828 02/08/24 0449  WBC 1.6*  --  1.8* 2.2* 2.2* 2.7* 2.2*  NEUTROABS 1.0*  --   --   --  1.4*  --  1.3*  HGB 6.0*   < > 7.0* 8.2* 8.0* 8.7* 9.7*  HCT 21.0*   < > 23.6* 27.4* 27.6* 30.9* 33.4*  MCV 87.1  --  85.8 87.0 88.2 90.9 90.3  PLT 21*  --  17* 18* 23* 23* 26*   < > = values in this interval not displayed.   Cardiac Enzymes: No results for input(s): "CKTOTAL", "CKMB", "CKMBINDEX", "TROPONINI" in the last 168 hours. BNP: Invalid input(s):  "POCBNP" CBG: Recent Labs  Lab 02/07/24 0733 02/07/24 1123 02/07/24 1613 02/07/24 2003 02/08/24 0731  GLUCAP 131* 244* 131* 245* 128*   D-Dimer No results for input(s): "DDIMER" in the last 72 hours. Hgb A1c No results for input(s): "HGBA1C" in the last 72 hours. Lipid Profile No results for input(s): "CHOL", "HDL", "LDLCALC", "TRIG", "CHOLHDL", "LDLDIRECT" in the last 72 hours. Thyroid function studies No results for input(s): "TSH", "T4TOTAL", "T3FREE", "THYROIDAB" in the last 72 hours.  Invalid input(s): "FREET3" Anemia work up No results for input(s): "VITAMINB12", "FOLATE", "FERRITIN", "TIBC", "IRON", "RETICCTPCT" in the last 72 hours. Urinalysis    Component Value Date/Time   COLORURINE YELLOW 10/02/2020 2214   APPEARANCEUR CLEAR 10/02/2020 2214   LABSPEC 1.021 10/02/2020 2214   PHURINE 5.0 10/02/2020 2214   GLUCOSEU 150 (A) 10/02/2020 2214   HGBUR NEGATIVE 10/02/2020 2214   BILIRUBINUR NEGATIVE 10/02/2020 2214   KETONESUR NEGATIVE 10/02/2020 2214   PROTEINUR NEGATIVE 10/02/2020 2214   NITRITE NEGATIVE 10/02/2020 2214   LEUKOCYTESUR NEGATIVE 10/02/2020 2214   Sepsis Labs Recent Labs  Lab 02/04/24 0459 02/05/24 0505 02/06/24 0828 02/08/24 0449  WBC 2.2* 2.2* 2.7* 2.2*   Microbiology Recent Results (from the past 240 hours)  MRSA Next Gen by PCR, Nasal     Status: Abnormal   Collection Time: 02/02/24  3:00 AM   Specimen: Nasal Mucosa; Nasal Swab  Result Value Ref Range Status   MRSA by PCR Next Gen DETECTED (A) NOT DETECTED Final    Comment: (NOTE) The GeneXpert MRSA Assay (FDA approved for NASAL specimens only), is one component of a comprehensive MRSA colonization surveillance program. It is not intended to diagnose MRSA infection nor to guide or monitor treatment for MRSA infections. Test performance is not FDA approved in patients less than 56 years old. Performed at [facility], [address] [place], [place] [address]      Please note: You were cared for by a hospitalist during your hospital stay. Once you are discharged, your primary care physician will handle any further medical issues. Please note that NO REFILLS for any discharge medications  will be authorized once you are discharged, as it is imperative that you return to your primary care physician (or establish a relationship with a primary care physician if you do not have one) for your post hospital discharge needs so that they can reassess your need for medications and monitor your lab values.    Time coordinating discharge: 40 minutes  SIGNED:   Burnadette Pop, MD  [facility] 02/08/2024, 10:59 AM Pager [phone]  If 7PM-7AM, please contact night-coverage [email or web address] Password [email or web address]

## 2024-02-08 NOTE — Progress Notes (Signed)
 [facility] Gastroenterology Progress Note  MINOR IDEN 58 y.o. November 13, 1965  CC: Anemia, pancytopenia, FOBT negative   Subjective: Patient seen and examined at bedside.  Denies any acute GI issues.  Wants to go home.  ROS : Afebrile, negative for chest pain   Objective: Vital signs in last 24 hours: Vitals:   02/08/24 0945 02/08/24 1010  BP: 127/82 114/78  Pulse: 71 68  Resp: 11 12  Temp:  99.3 F (37.4 C)  SpO2: 100% 95%    Physical Exam:  General:  Alert, cooperative, no distress, appears stated age  Head:  Normocephalic, without obvious abnormality, atraumatic  Eyes:  , EOM's intact,   Lungs:   Clear to auscultation bilaterally, respirations unlabored  Heart:  Regular rate and rhythm, S1, S2 normal  Abdomen:   Soft, non-tender, bowel sounds active all four quadrants,  no masses,   Extremities: Extremities normal, atraumatic, no  edema  Pulses: 2+ and symmetric    Lab Results: No results for input(s): "NA", "K", "CL", "CO2", "GLUCOSE", "BUN", "CREATININE", "CALCIUM", "MG", "PHOS" in the last 72 hours. No results for input(s): "AST", "ALT", "ALKPHOS", "BILITOT", "PROT", "ALBUMIN" in the last 72 hours. Recent Labs    02/06/24 0828 02/08/24 0449  WBC 2.7* 2.2*  NEUTROABS  --  1.3*  HGB 8.7* 9.7*  HCT 30.9* 33.4*  MCV 90.9 90.3  PLT 23* 26*   No results for input(s): "LABPROT", "INR" in the last 72 hours.    Assessment/Plan: -Pancytopenia with severe thrombocytopenia with platelet count of 23 - Anemia, FOBT negative.  Status post bone marrow biopsy.  Patient with chronic anemia and chronic pancytopenia. - History of cirrhosis from chronic hepatitis C.  No overt bleeding.  Recommendations ---------------------------- - No indication for inpatient EGD and lack of overt bleeding with negative FOBT. - Recommend follow-up with primary GI at [facility] after discharge.  GI will sign off.  Call us back if needed.  Will also need outpatient hematology follow-up  for his pancytopenia.   Kathi Der MD, FACP 02/08/2024, 10:39 AM  Contact #  [phone]

## 2024-02-08 NOTE — Progress Notes (Signed)
   02/08/24 0600  Provider Notification  Provider Name/Title Chinita Greenland,  NP  Date Provider Notified 02/08/24  Time Provider Notified 0530  Method of Notification Page Erskine Speed)  Notification Reason Critical Result  Test performed and critical result Platelets 26  Date Critical Result Received 02/08/24  Time Critical Result Received 0540  Provider response Other (Comment) (Procedure schedule 4/7)  Date of Provider Response 02/08/24  Time of Provider Response 0530

## 2024-02-08 NOTE — TOC Transition Note (Signed)
 Transition of Care [facility]) - Discharge Note   Patient Details  Name: Chase Reed MRN: [id number] Date of Birth: 09-19-1966  [facility] CM/SW Contact:  Lanier Clam, RN Phone Number: 02/08/2024, 11:09 AM   Clinical Narrative: d/c home no CM needs.      Final next level of care: Home/Self Care Barriers to Discharge: No Barriers Identified   Patient Goals and CMS Choice            Discharge Placement                       Discharge Plan and Services Additional resources added to the After Visit Summary for                                       Social Drivers of Health (SDOH) Interventions SDOH Screenings   Food Insecurity: No Food Insecurity (02/01/2024)  Housing: Low Risk  (02/03/2024)  Recent Concern: Housing - High Risk (02/03/2024)  Transportation Needs: No Transportation Needs (02/03/2024)  Recent Concern: Transportation Needs - Unmet Transportation Needs (02/01/2024)  Utilities: Not At Risk (02/01/2024)  Alcohol Screen: Low Risk  (09/11/2017)  Depression (PHQ2-9): Low Risk  (12/31/2022)  Financial Resource Strain: Low Risk  (04/13/2023)   Received from [facility], [facility]  Social Connections: Unknown (03/04/2022)   Received from [facility], [facility]  Tobacco Use: High Risk (02/01/2024)     Readmission Risk Interventions    08/27/2022    2:27 PM  Readmission Risk Prevention Plan  Transportation Screening Complete  PCP or Specialist Appt within 5-7 Days Complete  Home Care Screening Complete  Medication Review (RN CM) Complete

## 2024-02-08 NOTE — Progress Notes (Signed)
 Discharge instructions reviewed with patient.. All questions answered. All belongings accounted for. Patient to follow up with MD in  1 weeks. Patient medications hand delivered from outpatient pharmacy. PIV removed. Patient arranging for transport home via friend/family.   Informed that b-12 was out of stock and will need to pick up OTC.

## 2024-02-10 ENCOUNTER — Telehealth: Payer: Self-pay | Admitting: Cardiology

## 2024-02-10 ENCOUNTER — Ambulatory Visit (HOSPITAL_BASED_OUTPATIENT_CLINIC_OR_DEPARTMENT_OTHER): Payer: MEDICAID | Admitting: Cardiology

## 2024-02-10 LAB — SURGICAL PATHOLOGY

## 2024-02-10 NOTE — Telephone Encounter (Signed)
 Patient said he just got out the hospital and it was not heart related. Said that his blood count was low. Wanted to know if Dr. Cristal Deer still needed to him for a f/u visit

## 2024-02-10 NOTE — Telephone Encounter (Signed)
 Returned call to pt. Pt scheduled to be seen by Dr. Cristal Deer 8/12 @ 9:40 am. Pt verbalized understanding.

## 2024-02-11 ENCOUNTER — Telehealth: Payer: Self-pay | Admitting: Hematology and Oncology

## 2024-02-15 ENCOUNTER — Emergency Department (HOSPITAL_BASED_OUTPATIENT_CLINIC_OR_DEPARTMENT_OTHER): Payer: MEDICAID

## 2024-02-15 ENCOUNTER — Encounter (HOSPITAL_BASED_OUTPATIENT_CLINIC_OR_DEPARTMENT_OTHER): Payer: Self-pay

## 2024-02-15 ENCOUNTER — Emergency Department (HOSPITAL_BASED_OUTPATIENT_CLINIC_OR_DEPARTMENT_OTHER)
Admission: EM | Admit: 2024-02-15 | Discharge: 2024-02-15 | Disposition: A | Discharge status: Home / Self Care | Payer: MEDICAID | Attending: Emergency Medicine | Admitting: Emergency Medicine

## 2024-02-15 DIAGNOSIS — L03115 Cellulitis of right lower limb: Secondary | ICD-10-CM

## 2024-02-15 DIAGNOSIS — M7989 Other specified soft tissue disorders: Secondary | ICD-10-CM | POA: Insufficient documentation

## 2024-02-15 DIAGNOSIS — M79661 Pain in right lower leg: Secondary | ICD-10-CM | POA: Insufficient documentation

## 2024-02-15 DIAGNOSIS — E1165 Type 2 diabetes mellitus with hyperglycemia: Secondary | ICD-10-CM | POA: Diagnosis not present

## 2024-02-15 DIAGNOSIS — Z794 Long term (current) use of insulin: Secondary | ICD-10-CM | POA: Insufficient documentation

## 2024-02-15 LAB — CBC WITH DIFFERENTIAL/PLATELET
Abs Immature Granulocytes: 0.02 10*3/uL (ref 0.00–0.07)
Basophils Absolute: 0 10*3/uL (ref 0.0–0.1)
Basophils Relative: 1 %
Eosinophils Absolute: 0.2 10*3/uL (ref 0.0–0.5)
Eosinophils Relative: 7 %
HCT: 33 % — ABNORMAL LOW (ref 39.0–52.0)
Hemoglobin: 10.3 g/dL — ABNORMAL LOW (ref 13.0–17.0)
Immature Granulocytes: 1 %
Lymphocytes Relative: 16 %
Lymphs Abs: 0.4 10*3/uL — ABNORMAL LOW (ref 0.7–4.0)
MCH: 27.2 pg (ref 26.0–34.0)
MCHC: 31.2 g/dL (ref 30.0–36.0)
MCV: 87.1 fL (ref 80.0–100.0)
Monocytes Absolute: 0.2 10*3/uL (ref 0.1–1.0)
Monocytes Relative: 9 %
Neutro Abs: 1.7 10*3/uL (ref 1.7–7.7)
Neutrophils Relative %: 66 %
Platelets: 35 10*3/uL — ABNORMAL LOW (ref 150–400)
RBC: 3.79 MIL/uL — ABNORMAL LOW (ref 4.22–5.81)
RDW: 20.5 % — ABNORMAL HIGH (ref 11.5–15.5)
Smear Review: DECREASED
WBC: 2.6 10*3/uL — ABNORMAL LOW (ref 4.0–10.5)
nRBC: 0 % (ref 0.0–0.2)

## 2024-02-15 LAB — COMPREHENSIVE METABOLIC PANEL WITH GFR
ALT: 16 U/L (ref 0–44)
AST: 19 U/L (ref 15–41)
Albumin: 4.5 g/dL (ref 3.5–5.0)
Alkaline Phosphatase: 96 U/L (ref 38–126)
Anion gap: 8 (ref 5–15)
BUN: 20 mg/dL (ref 6–20)
CO2: 28 mmol/L (ref 22–32)
Calcium: 9.1 mg/dL (ref 8.9–10.3)
Chloride: 101 mmol/L (ref 98–111)
Creatinine, Ser: 0.96 mg/dL (ref 0.61–1.24)
GFR, Estimated: >60 mL/min (ref >60)
Glucose, Bld: 148 mg/dL — ABNORMAL HIGH (ref 70–99)
Potassium: 4 mmol/L (ref 3.5–5.1)
Sodium: 137 mmol/L (ref 135–145)
Total Bilirubin: 0.5 mg/dL (ref 0.0–1.2)
Total Protein: 7.8 g/dL (ref 6.5–8.1)

## 2024-02-15 MED ORDER — OXYCODONE HCL 5 MG PO TABS: 2.5000 mg | ORAL_TABLET | Freq: Four times a day (QID) | ORAL | 0 refills | Status: AC | PRN

## 2024-02-15 MED ORDER — OXYCODONE-ACETAMINOPHEN 5-325 MG PO TABS: 1.0000 | ORAL_TABLET | Freq: Four times a day (QID) | ORAL | 0 refills | Status: DC | PRN

## 2024-02-15 MED ORDER — DOXYCYCLINE HYCLATE 100 MG PO CAPS: 100.0000 mg | ORAL_CAPSULE | Freq: Two times a day (BID) | ORAL | 0 refills | Status: AC

## 2024-02-15 MED ORDER — OXYCODONE-ACETAMINOPHEN 5-325 MG PO TABS
2.0000 | ORAL_TABLET | Freq: Once | ORAL | Status: AC
  Administered 2024-02-15: 2 via ORAL
  Filled 2024-02-15: qty 2

## 2024-02-15 MED ORDER — DOXYCYCLINE HYCLATE 100 MG PO TABS
100.0000 mg | ORAL_TABLET | Freq: Once | ORAL | Status: AC
  Administered 2024-02-15: 100 mg via ORAL
  Filled 2024-02-15: qty 1

## 2024-02-15 NOTE — ED Provider Notes (Signed)
 [place] EMERGENCY DEPARTMENT AT [facility] Provider Note   CSN: [id number] Arrival date & time: 02/15/24  1820     History  No chief complaint on file.   Chase Reed is a 58 y.o. male a past medical history of chronic hep C, diabetes, recent hospitalization due to severe pancytopenia requiring multiple blood transfusions.  Patient has a history of substance abuse but states he has been clean for the last 10 months.  He presents to the emergency department for evaluation of right lower leg pain and swelling.  He noticed some pain and redness in his leg after discharge from the hospital.  He did have a long car ride and was recently working on finishing a deck.  He started to have progressively worsening pain and swelling in the leg which he rates at 10 out of 10.  He denies any chest pain or shortness of breath.  HPI     Home Medications Prior to Admission medications   Medication Sig Start Date End Date Taking? Authorizing Provider  cyanocobalamin 1000 MCG tablet Take 1 tablet (1,000 mcg total) by mouth daily. 02/09/24   Adhikari, Amrit, MD  ferrous sulfate 325 (65 FE) MG tablet Take 1 tablet (325 mg total) by mouth 2 (two) times daily. 02/08/24 02/07/25  Adhikari, Amrit, MD  gabapentin (NEURONTIN) 600 MG tablet Take 600 mg by mouth 4 (four) times daily. 01/20/19   [provider]  insulin aspart (NOVOLOG) 100 UNIT/ML injection Inject 2-8 Units into the skin See admin instructions. Take 2-8 units as needed  Sliding scale.over 250--2 units over 300--4 units over 350--6 units over 400--8 units Up to 50 units daily: For diabetes management Patient taking differently: Inject 2-8 Units into the skin See admin instructions. Inject 2-8 units into the skin one to four times a day as needed for elevated BGL, PER SLIDING SCALE- BGL over 250 = 2 units; over 300 = 4 units; over 350 = 6 units; over 400 = 8 units (Up to 50 units daily: For diabetes management) 10/20/21   Audria Leather, MD  potassium chloride (KLOR-CON) 10 MEQ tablet Take 1 tablet (10 mEq total) by mouth as needed. For severe leg cramps. 12/01/23   Sheryle Donning, MD  sertraline (ZOLOFT) 100 MG tablet Take 100 mg by mouth daily. 03/11/21   [provider]      Allergies    Atorvastatin    Review of Systems   Review of Systems  Physical Exam Updated Vital Signs BP (!) 156/99   Pulse 72   Temp 97.9 F (36.6 C)   Resp 20   SpO2 100%  Physical Exam Vitals and nursing note reviewed.  Constitutional:      General: He is not in acute distress.    Appearance: He is well-developed. He is not diaphoretic.  HENT:     Head: Normocephalic and atraumatic.  Eyes:     General: No scleral icterus.    Conjunctiva/sclera: Conjunctivae normal.  Cardiovascular:     Rate and Rhythm: Normal rate and regular rhythm.     Heart sounds: Normal heart sounds.  Pulmonary:     Effort: Pulmonary effort is normal. No respiratory distress.     Breath sounds: Normal breath sounds.  Abdominal:     Palpations: Abdomen is soft.     Tenderness: There is no abdominal tenderness.  Musculoskeletal:     Cervical back: Normal range of motion and neck supple.     Comments: Swelling redness tenderness  especially on the medial posterior side of the left lower extremity extending into the popliteal fossa.  There is a ropey cordlike structure medially as well.  DP and PT pulse 2+  Skin:    General: Skin is warm and dry.  Neurological:     Mental Status: He is alert.  Psychiatric:        Behavior: Behavior normal.     ED Results / Procedures / Treatments   Labs (all labs ordered are listed, but only abnormal results are displayed) Labs Reviewed  COMPREHENSIVE METABOLIC PANEL WITH GFR - Abnormal; Notable for the following components:      Result Value   Glucose, Bld 148 (*)    All other components within normal limits  CBC WITH DIFFERENTIAL/PLATELET - Abnormal; Notable for the following components:    WBC 2.6 (*)    RBC 3.79 (*)    Hemoglobin 10.3 (*)    HCT 33.0 (*)    RDW 20.5 (*)    Platelets 35 (*)    Lymphs Abs 0.4 (*)    All other components within normal limits    EKG None  Radiology No results found.  Procedures Procedures    Medications Ordered in ED Medications - No data to display  ED Course/ Medical Decision Making/ A&P                                 Medical Decision Making Amount and/or Complexity of Data Reviewed Labs: ordered.  Risk Prescription drug management.   58 year old male with recent history of pancytopenia who presents emergency department with chief complaint of right lower extremity pain and swelling. Differential diagnosis includes cellulitis, superficial thrombophlebitis, DVT. I visualized and interpreted DVT study of the right lower extremity which shows no evidence of DVT.  There is evidence of thick cobblestone tissue suggestive of cellulitis.  Patient will be started on doxycycline.  He was given pain medication here.  PDMP reviewed. Reviewed the patient's labs.  He has pancytopenia however it is better than his last CBC a few days ago.  CMP shows mildly elevated blood glucose. PDMP reviewed during this encounter.         Final Clinical Impression(s) / ED Diagnoses Final diagnoses:  None    Rx / DC Orders ED Discharge Orders     None         Tama Fails, PA-C 02/15/24 2319    Hershel Los, MD 02/15/24 2321

## 2024-02-15 NOTE — Discharge Instructions (Addendum)
Contact a health care provider if: You have a fever. Your symptoms do not improve within 1-2 days of starting treatment or you develop new symptoms. Your bone or joint underneath the infected area becomes painful after the skin has healed. Your infection returns in the same area or another area. Signs of this may include: You notice a swollen bump in the infected area. Your red area gets larger, turns dark in color, or becomes more painful. Drainage increases. Pus or a bad smell develops in your infected area. You have more pain. You feel ill and have muscle aches and weakness. You develop vomiting or diarrhea that will not go away. Get help right away if: You notice red streaks coming from the infected area. You notice the skin turns purple or black and falls off. This symptom may be an emergency. Get help right away. Call 911. Do not wait to see if the symptom will go away. Do not drive yourself to the hospital.

## 2024-02-15 NOTE — ED Triage Notes (Signed)
 Pt c/o R calf pain, redness, swelling onset Friday AM, worsening since. Pt reports that he "just got out last Monday, hospitalized for a week." States he had multiple transfusions while hospitalized r/t anemia.

## 2024-02-16 ENCOUNTER — Inpatient Hospital Stay (HOSPITAL_BASED_OUTPATIENT_CLINIC_OR_DEPARTMENT_OTHER): Payer: MEDICAID | Admitting: Hematology and Oncology

## 2024-02-16 ENCOUNTER — Other Ambulatory Visit: Payer: Self-pay | Admitting: Hematology and Oncology

## 2024-02-16 ENCOUNTER — Inpatient Hospital Stay: Payer: MEDICAID | Attending: Internal Medicine

## 2024-02-16 VITALS — BP 150/91 | HR 67 | Temp 98.4°F | Resp 15 | Wt 207.7 lb

## 2024-02-16 DIAGNOSIS — D61818 Other pancytopenia: Secondary | ICD-10-CM | POA: Insufficient documentation

## 2024-02-16 DIAGNOSIS — D500 Iron deficiency anemia secondary to blood loss (chronic): Secondary | ICD-10-CM

## 2024-02-16 DIAGNOSIS — Z79899 Other long term (current) drug therapy: Secondary | ICD-10-CM | POA: Insufficient documentation

## 2024-02-16 DIAGNOSIS — E119 Type 2 diabetes mellitus without complications: Secondary | ICD-10-CM | POA: Diagnosis not present

## 2024-02-16 DIAGNOSIS — D649 Anemia, unspecified: Secondary | ICD-10-CM

## 2024-02-16 DIAGNOSIS — K7460 Unspecified cirrhosis of liver: Secondary | ICD-10-CM | POA: Diagnosis not present

## 2024-02-16 DIAGNOSIS — F17210 Nicotine dependence, cigarettes, uncomplicated: Secondary | ICD-10-CM | POA: Insufficient documentation

## 2024-02-16 DIAGNOSIS — B1920 Unspecified viral hepatitis C without hepatic coma: Secondary | ICD-10-CM | POA: Insufficient documentation

## 2024-02-16 DIAGNOSIS — D696 Thrombocytopenia, unspecified: Secondary | ICD-10-CM

## 2024-02-16 LAB — CBC WITH DIFFERENTIAL (CANCER CENTER ONLY)
Abs Immature Granulocytes: 0.01 10*3/uL (ref 0.00–0.07)
Basophils Absolute: 0 10*3/uL (ref 0.0–0.1)
Basophils Relative: 1 %
Eosinophils Absolute: 0.2 10*3/uL (ref 0.0–0.5)
Eosinophils Relative: 6 %
HCT: 31.4 % — ABNORMAL LOW (ref 39.0–52.0)
Hemoglobin: 9.8 g/dL — ABNORMAL LOW (ref 13.0–17.0)
Immature Granulocytes: 0 %
Lymphocytes Relative: 17 %
Lymphs Abs: 0.4 10*3/uL — ABNORMAL LOW (ref 0.7–4.0)
MCH: 27.5 pg (ref 26.0–34.0)
MCHC: 31.2 g/dL (ref 30.0–36.0)
MCV: 88 fL (ref 80.0–100.0)
Monocytes Absolute: 0.2 10*3/uL (ref 0.1–1.0)
Monocytes Relative: 9 %
Neutro Abs: 1.6 10*3/uL — ABNORMAL LOW (ref 1.7–7.7)
Neutrophils Relative %: 67 %
Platelet Count: 35 10*3/uL — ABNORMAL LOW (ref 150–400)
RBC: 3.57 MIL/uL — ABNORMAL LOW (ref 4.22–5.81)
RDW: 20.3 % — ABNORMAL HIGH (ref 11.5–15.5)
WBC Count: 2.4 10*3/uL — ABNORMAL LOW (ref 4.0–10.5)
nRBC: 0 % (ref 0.0–0.2)

## 2024-02-16 LAB — CMP (CANCER CENTER ONLY)
ALT: 15 U/L (ref 0–44)
AST: 20 U/L (ref 15–41)
Albumin: 4.2 g/dL (ref 3.5–5.0)
Alkaline Phosphatase: 101 U/L (ref 38–126)
Anion gap: 6 (ref 5–15)
BUN: 19 mg/dL (ref 6–20)
CO2: 26 mmol/L (ref 22–32)
Calcium: 9 mg/dL (ref 8.9–10.3)
Chloride: 106 mmol/L (ref 98–111)
Creatinine: 1.02 mg/dL (ref 0.61–1.24)
GFR, Estimated: >60 mL/min (ref >60)
Glucose, Bld: 162 mg/dL — ABNORMAL HIGH (ref 70–99)
Potassium: 4.6 mmol/L (ref 3.5–5.1)
Sodium: 138 mmol/L (ref 135–145)
Total Bilirubin: 0.5 mg/dL (ref 0.0–1.2)
Total Protein: 7.6 g/dL (ref 6.5–8.1)

## 2024-02-16 NOTE — Progress Notes (Signed)
 [facility] [phone]   [phone]  PROGRESS NOTE  Patient Care Team: Courtney Heys, PA-C as PCP - General (Family Medicine) Jodelle Red, MD as PCP - Cardiology (Cardiology)  Hematological/Oncological History # Pancytopenia 1) 12/23/2018: WBC 8.7, Hgb 11.8, Plt 97. Admitted for chest wall abscess.  2) 12/25/2018: WBC 3.9, hgb 9.5, Plt 75 3) 01/11/2019: WBC 2.3, Hgb 10.1, Plt 42 4) 01/18/2019: WBC 1.8, Hgb 9.3, Plt 55. Discharged 01/21/2019 5) 09/20/2019: Establish care with Dr. Leonides Schanz. WBC 3.4, Hgb 11.8, MCV 87.6, Plt 35  6)  04/12/2020: WBC 2.1, Hgb 11.8, MCV 92, Plt 43.  7) 04/30/2020: WBC 2.8, Hgb 12.1, MCV 89.5, Plt 46  Interval History:  Chase Reed 58 y.o. 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 The patient's last visit was on 04/30/21. In the interim since the last visit the patient's blood counts have failed to improve.   On exam today Mr Mcferran notes he has felt much better since he left the hospital.  He reports he is not having any issues with shortness of breath and feels "energetic".  He believes the red blood cell transfusions helped perk him up.  He reports that he was having such terrible shortness of breath before admission he was having trouble walking across his yard.  He continues to work for appeal for disability.  He notes that he was recently helping a friend move and unfortunately hit his right leg and developed a red area concerning for cellulitis.  He was seen in the ER and given doxycycline therapy.  He reports the pain is improving.  He is currently taking his vitamin B12 and iron p.o. supplementation.  He also notes he is doing his best to try to eat iron rich foods.  Overall he is markedly improved from his time in the hospital. He currently denies any fevers, chills, sweats, nausea, vomiting or diarrhea.  Full 10 point ROS is  listed below.  MEDICAL HISTORY:  Past Medical History:  Diagnosis Date   Chronic idiopathic thrombocytopenia (HCC)    Chronic neutropenia (HCC)    Cirrhosis (HCC)    DM (diabetes mellitus) (HCC)    Hepatitis     SURGICAL HISTORY: Past Surgical History:  Procedure Laterality Date   APPLICATION OF WOUND VAC N/A 12/24/2018   Procedure: APPLICATION OF WOUND VAC;  Surgeon: Alleen Borne, MD;  Location: [facility] OR;  Service: Thoracic;  Laterality: N/A;   APPLICATION OF WOUND VAC N/A 12/27/2018   Procedure: WOUND VAC CHANGE;  Surgeon: Alleen Borne, MD;  Location: [facility] OR;  Service: Thoracic;  Laterality: N/A;   APPLICATION OF WOUND VAC N/A 01/14/2019   Procedure: APPLICATION OF WOUND VAC;  Surgeon: Alleen Borne, MD;  Location: [facility] OR;  Service: Vascular;  Laterality: N/A;   I & D EXTREMITY N/A 01/14/2019   Procedure: DEBRIDEMENT OF STERNOCLAVICULAR JOINT;  Surgeon: Alleen Borne, MD;  Location: [facility] OR;  Service: Vascular;  Laterality: N/A;   I & D EXTREMITY Right 10/03/2020   Procedure: Irrigation debridement of RIGHT forearm subfascial abscess measuring approximately 6 x 3 x 1.5 cm;  Surgeon: Ernest Mallick, MD;  Location: [facility] OR;  Service: Orthopedics;  Laterality: Right;   LITHOTRIPSY     STERNAL WOUND DEBRIDEMENT N/A 12/24/2018   Procedure: I&D CHEST WALL ABSCESS;  Surgeon: Alleen Borne, MD;  Location: [facility] OR;  Service: Thoracic;  Laterality:  N/A;   STERNAL WOUND DEBRIDEMENT N/A 12/27/2018   Procedure: DEBRIDEMENT OF CHEST WALL WOUND;  Surgeon: Bartley Lightning, MD;  Location: [facility] OR;  Service: Thoracic;  Laterality: N/A;   TEE WITHOUT CARDIOVERSION N/A 01/03/2019   Procedure: TRANSESOPHAGEAL ECHOCARDIOGRAM (TEE);  Surgeon: Luana Rumple, MD;  Location: [facility] ENDOSCOPY;  Service: Cardiovascular;  Laterality: N/A;    SOCIAL HISTORY: Social History   Socioeconomic History   Marital status: Married    Spouse name: Not on file   Number of children: Not on file   Years of education:  Not on file   Highest education level: Not on file  Occupational History   Occupation: Building Education administrator  Tobacco Use   Smoking status: Some Days    Current packs/day: 0.25    Average packs/day: 0.3 packs/day for 30.0 years (7.5 ttl pk-yrs)    Types: Cigarettes   Smokeless tobacco: Former  Building services engineer status: Never Used  Substance and Sexual Activity   Alcohol use: No   Drug use: Not Currently    Types: Heroin, IV    Comment: Fentanyl and "Oxy" per patient    Sexual activity: Never  Other Topics Concern   Not on file  Social History Narrative   Not on file   Social Drivers of Health   Financial Resource Strain: Low Risk  (04/13/2023)   Received from [facility]   Overall Financial Resource Strain (CARDIA)    Difficulty of Paying Living Expenses: Not hard at all  Food Insecurity: No Food Insecurity (02/01/2024)   Hunger Vital Sign    Worried About Running Out of Food in the Last Year: Never true    Ran Out of Food in the Last Year: Never true  Transportation Needs: No Transportation Needs (02/03/2024)   PRAPARE - Administrator, Civil Service (Medical): No    Lack of Transportation (Non-Medical): No  Recent Concern: Transportation Needs - Unmet Transportation Needs (02/01/2024)   PRAPARE - Administrator, Civil Service (Medical): Yes    Lack of Transportation (Non-Medical): Yes  Physical Activity: Not on file  Stress: Not on file  Social Connections: Unknown (03/04/2022)   Received from [facility], [facility]   Social Network    Social Network: Not on file  Intimate Partner Violence: Not At Risk (02/01/2024)   Humiliation, Afraid, Rape, and Kick questionnaire    Fear of Current or Ex-Partner: No    Emotionally Abused: No    Physically Abused: No    Sexually Abused: No    FAMILY HISTORY: Family History  Problem Relation Age of Onset   COPD Father     ALLERGIES:  is allergic to atorvastatin.  MEDICATIONS:   Current Outpatient Medications  Medication Sig Dispense Refill   cyanocobalamin 1000 MCG tablet Take 1 tablet (1,000 mcg total) by mouth daily. 30 tablet 0   doxycycline (VIBRAMYCIN) 100 MG capsule Take 1 capsule (100 mg total) by mouth 2 (two) times daily. One po bid x 7 days 14 capsule 0   ferrous sulfate 325 (65 FE) MG tablet Take 1 tablet (325 mg total) by mouth 2 (two) times daily. 60 tablet 1   gabapentin (NEURONTIN) 600 MG tablet Take 600 mg by mouth 4 (four) times daily.     insulin aspart (NOVOLOG) 100 UNIT/ML injection Inject 2-8 Units into the skin See admin instructions. Take 2-8 units as needed  Sliding scale.over 250--2 units over 300--4 units over 350--6 units over  400--8 units Up to 50 units daily: For diabetes management (Patient taking differently: Inject 2-8 Units into the skin See admin instructions. Inject 2-8 units into the skin one to four times a day as needed for elevated BGL, PER SLIDING SCALE- BGL over 250 = 2 units; over 300 = 4 units; over 350 = 6 units; over 400 = 8 units (Up to 50 units daily: For diabetes management))     oxyCODONE (ROXICODONE) 5 MG immediate release tablet Take 0.5-1 tablets (2.5-5 mg total) by mouth every 6 (six) hours as needed for severe pain (pain score 7-10). 10 tablet 0   potassium chloride (KLOR-CON) 10 MEQ tablet Take 1 tablet (10 mEq total) by mouth as needed. For severe leg cramps. 30 tablet 3   sertraline (ZOLOFT) 100 MG tablet Take 100 mg by mouth daily.     No current facility-administered medications for this visit.    REVIEW OF SYSTEMS:   Constitutional: ( - ) fevers, ( - )  chills , ( - ) night sweats Eyes: ( - ) blurriness of vision, ( - ) double vision, ( - ) watery eyes Ears, nose, mouth, throat, and face: ( - ) mucositis, ( - ) sore throat Respiratory: ( - ) cough, ( - ) dyspnea, ( - ) wheezes Cardiovascular: ( - ) palpitation, ( - ) chest discomfort, ( - ) lower extremity swelling Gastrointestinal:  ( - ) nausea, ( - )  heartburn, ( - ) change in bowel habits Skin: ( - ) abnormal skin rashes Lymphatics: ( - ) new lymphadenopathy, ( - ) easy bruising Neurological: ( - ) numbness, ( - ) tingling, ( - ) new weaknesses Behavioral/Psych: ( - ) mood change, ( - ) new changes  All other systems were reviewed with the patient and are negative.  PHYSICAL EXAMINATION: ECOG PERFORMANCE STATUS: 1 - Symptomatic but completely ambulatory  Vitals:   02/16/24 1032  BP: (!) 150/91  Pulse: 67  Resp: 15  Temp: 98.4 F (36.9 C)  SpO2: 98%    Filed Weights   02/16/24 1032  Weight: 207 lb 11.2 oz (94.2 kg)     GENERAL: well appearing middle aged Caucasian alert, no distress and comfortable SKIN: skin color, texture, turgor are normal, no rashes or significant lesions EYES: conjunctiva are pink and non-injected, sclera clear OROPHARYNX: no exudate, no erythema; lips, buccal mucosa, and tongue normal. Mild oozing of blood from right lower molar site.  LUNGS: clear to auscultation and percussion with normal breathing effort HEART: regular rate & rhythm and no murmurs and no lower extremity edema Musculoskeletal: no cyanosis of digits and no clubbing  PSYCH: alert & oriented x 3, fluent speech NEURO: no focal motor/sensory deficits  LABORATORY DATA:  I have reviewed the data as listed    Latest Ref Rng & Units 02/16/2024   10:09 AM 02/15/2024    6:44 PM 02/08/2024    4:49 AM  CBC  WBC 4.0 - 10.5 K/uL 2.4  2.6  2.2   Hemoglobin 13.0 - 17.0 g/dL 9.8  13.2  9.7   Hematocrit 39.0 - 52.0 % 31.4  33.0  33.4   Platelets 150 - 400 K/uL 35  35  26        Latest Ref Rng & Units 02/16/2024   10:09 AM 02/15/2024    6:44 PM 02/03/2024    5:08 AM  CMP  Glucose 70 - 99 mg/dL 440  102  725   BUN 6 - 20  mg/dL 19  20  15    Creatinine 0.61 - 1.24 mg/dL 9.62  9.52  8.41   Sodium 135 - 145 mmol/L 138  137  137   Potassium 3.5 - 5.1 mmol/L 4.6  4.0  4.2   Chloride 98 - 111 mmol/L 106  101  109   CO2 22 - 32 mmol/L 26  28  20     Calcium 8.9 - 10.3 mg/dL 9.0  9.1  8.4   Total Protein 6.5 - 8.1 g/dL 7.6  7.8  6.4   Total Bilirubin 0.0 - 1.2 mg/dL 0.5  0.5  0.8   Alkaline Phos 38 - 126 U/L 101  96  76   AST 15 - 41 U/L 20  19  29    ALT 0 - 44 U/L 15  16  24      RADIOGRAPHIC STUDIES: US Venous Img Lower Unilateral Right Result Date: 02/15/2024 CLINICAL DATA:  Evaluate for DVT EXAM: RIGHT LOWER EXTREMITY VENOUS DOPPLER ULTRASOUND TECHNIQUE: Gray-scale sonography with compression, as well as color and duplex ultrasound, were performed to evaluate the deep venous system(s) from the level of the common femoral vein through the popliteal and proximal calf veins. COMPARISON:  None Available. FINDINGS: VENOUS Normal compressibility of the common femoral, superficial femoral, and popliteal veins, as well as the visualized calf veins. Visualized portions of profunda femoral vein and great saphenous vein unremarkable. No filling defects to suggest DVT on grayscale or color Doppler imaging. Doppler waveforms show normal direction of venous flow, normal respiratory plasticity and response to augmentation. Limited views of the contralateral common femoral vein are unremarkable. OTHER None. Limitations: none IMPRESSION: Negative. Electronically Signed   By: Darliss Cheney M.D.   On: 02/15/2024 21:49   CT BONE MARROW BIOPSY & ASPIRATION Result Date: 02/08/2024 INDICATION: [id number] Pancytopenia (HCC) [id number] EXAM: CT GUIDED BONE MARROW ASPIRATION AND CORE BIOPSY MEDICATIONS: None. ANESTHESIA/SEDATION: Moderate (conscious) sedation was employed during this procedure. A total of Versed 4 mg and Fentanyl 100 mcg was administered intravenously. Moderate Sedation Time: 20 minutes. The patient's level of consciousness and vital signs were monitored continuously by radiology nursing throughout the procedure under my direct supervision. FLUOROSCOPY TIME:  CT dose; 102 mGycm COMPLICATIONS: None immediate. Estimated blood loss: <5 mL PROCEDURE: RADIATION DOSE  REDUCTION: This exam was performed according to the departmental dose-optimization program which includes automated exposure control, adjustment of the mA and/or kV according to patient size and/or use of iterative reconstruction technique. Informed written consent was obtained from the patient after a thorough discussion of the procedural risks, benefits and alternatives. All questions were addressed. Maximal Sterile Barrier Technique was utilized including caps, mask, sterile gowns, sterile gloves, sterile drape, hand hygiene and skin antiseptic. A timeout was performed prior to the initiation of the procedure. The patient was positioned prone and non-contrast localization CT was performed of the pelvis to demonstrate the iliac marrow spaces. Under sterile conditions and local anesthesia, an 11 gauge coaxial bone biopsy needle was advanced into the RIGHT iliac marrow space. Needle position was confirmed with CT imaging. Initially, bone marrow aspiration was performed. Next, the 11 gauge outer cannula was utilized to obtain a 2 iliac bone marrow core biopsy. Needle was removed. Hemostasis was obtained with compression. The patient tolerated the procedure well. Samples were prepared with the cytotechnologist. IMPRESSION: Successful CT-guided bone marrow aspiration and biopsy. Roanna Banning, MD [facility] Electronically Signed   By: Roanna Banning M.D.   On: 02/08/2024 16:16  DG Chest 2 View Result Date: 02/01/2024 CLINICAL DATA:  Chest pain EXAM: CHEST - 2 VIEW COMPARISON:  Chest x-ray 10/18/2021 FINDINGS: There are increased central interstitial markings. There is no focal lung infiltrate, pleural effusion or pneumothorax. The cardiomediastinal silhouette is within normal limits. No acute fractures are seen. IMPRESSION: Increased central interstitial markings may reflect bronchitis or atypical pneumonia. Electronically Signed   By: Tyron Gallon M.D.   On:  02/01/2024 17:55    ASSESSMENT & PLAN RHYLAND HINDERLITER 59 y.o. male with medical history significant for cirrhosis 2/2 to Hep C, DM type II, and polysubstance abuse who presents for follow up to evaluate of longstanding pancytopenia.    Pancytopenia in the setting of cirrhosis is common, particularly when splenomegaly is present as well ( Can J Gastroenterol. 2009;23(6):441-445.). This patient has marked splenomegaly, with a spleen measuring 21.4 cm and containing 1L of volume. In this case the body is sequestering cells of all types in the spleen, causing the apparent circulating value to appear quite low. Additionally the liver produces thrombopoietin, which stimulates the marrow to produce platelets. When the liver functions is impaired thrombopoietin decreases, resulting in decreased platelets levels. Unfortunately there is no definitive treatment for splenomegaly causes by cirrhosis (due to increased portal pressure). TIPS and beta blockers to decrease portal pressure have been shown to help in some cases, however cytopenias alone are not an indication for the procedure.   During his last visit the patient had an extensive workup including an attritional evaluation, review the peripheral blood film, and viral work-up revealed no other possible etiologies for the patient's pancytopenia.  In the interim since her last visit the patient has been treated by GI for his hepatitis C.    In the event the patient was in a car procedure is having issues with bleeding we would happily see him back for the consideration of TPO mimetic therapy.  #Pancytopenia in the Setting of Cirrhosis -- Patient had an acute drop while hospitalized in late March/April 2025.  Etiology of the drop is unclear. --Underwent a bone marrow biopsy which showed no evidence of underlying bone marrow dysfunction --Found to have borderline low levels of vitamin B12 and low iron.  Started on p.o. supplementation for both. --HIV  negative, known Hep C positive with cirrhosis --the patient's pancytopenia is most consistent with massive splenomegaly and cirrhosis. Splenomegaly (substantial, noted at 1L of volume (nml 250cc) and 21.4 cm)) in Feb 2020.  -- in the event his Plt count were to drop <50 and he were to require a procedure we can potentially treat with avatrombopag to help elevate these levels.  --RTC in 2 months to assure levels are improving on PO supplementation.   #Normocytic Anemia -- Recent massive drop concerning for bleed.  Found to be iron deficient. -- Currently on ferrous sulfate 325 mg p.o. daily. -- Strict return precautions for any signs or symptoms concerning for bleeding -- Plan to see the patient back in 2 months time in order to assure p.o. supplementation is adequate.  If not we will provide patient with IV iron therapy.  #Cirrhosis 2/2 to Hepatitis C --patient of GI at [facility]  --patient has completed Hep C antiviral treatment --defer management of cirrhosis and Hep C GI center   #IV Drug Use #Mental Health --continue to monitor  No orders of the defined types were placed in this encounter.   All questions were answered. The patient knows to call the clinic with any problems, questions  or concerns.  A total of more than 30 minutes were spent on this encounter and over half of that time was spent on counseling and coordination of care as outlined above.   Rogerio Clay, MD Department of Hematology/Oncology [facility] Phone: [phone] Pager: [phone] Email: [email or web address]  02/16/2024 1:24 PM

## 2024-02-17 ENCOUNTER — Encounter (HOSPITAL_COMMUNITY): Payer: Self-pay | Admitting: Hematology and Oncology

## 2024-02-22 ENCOUNTER — Other Ambulatory Visit: Payer: MEDICAID

## 2024-02-22 ENCOUNTER — Ambulatory Visit: Payer: MEDICAID | Admitting: Hematology and Oncology

## 2024-04-17 ENCOUNTER — Other Ambulatory Visit: Payer: Self-pay | Admitting: Hematology and Oncology

## 2024-04-17 DIAGNOSIS — D649 Anemia, unspecified: Secondary | ICD-10-CM

## 2024-04-18 ENCOUNTER — Inpatient Hospital Stay: Payer: MEDICAID | Attending: Internal Medicine

## 2024-04-18 ENCOUNTER — Inpatient Hospital Stay: Payer: MEDICAID | Admitting: Hematology and Oncology

## 2024-04-18 VITALS — BP 138/89 | HR 64 | Temp 98.4°F | Resp 16 | Wt 199.3 lb

## 2024-04-18 DIAGNOSIS — R161 Splenomegaly, not elsewhere classified: Secondary | ICD-10-CM | POA: Diagnosis not present

## 2024-04-18 DIAGNOSIS — B1920 Unspecified viral hepatitis C without hepatic coma: Secondary | ICD-10-CM | POA: Insufficient documentation

## 2024-04-18 DIAGNOSIS — D61818 Other pancytopenia: Secondary | ICD-10-CM | POA: Diagnosis present

## 2024-04-18 DIAGNOSIS — D500 Iron deficiency anemia secondary to blood loss (chronic): Secondary | ICD-10-CM

## 2024-04-18 DIAGNOSIS — D649 Anemia, unspecified: Secondary | ICD-10-CM | POA: Diagnosis not present

## 2024-04-18 DIAGNOSIS — D509 Iron deficiency anemia, unspecified: Secondary | ICD-10-CM | POA: Insufficient documentation

## 2024-04-18 DIAGNOSIS — K7469 Other cirrhosis of liver: Secondary | ICD-10-CM | POA: Insufficient documentation

## 2024-04-18 DIAGNOSIS — D696 Thrombocytopenia, unspecified: Secondary | ICD-10-CM

## 2024-04-18 LAB — CMP (CANCER CENTER ONLY)
ALT: 33 U/L (ref 0–44)
AST: 29 U/L (ref 15–41)
Albumin: 4.3 g/dL (ref 3.5–5.0)
Alkaline Phosphatase: 99 U/L (ref 38–126)
Anion gap: 8 (ref 5–15)
BUN: 14 mg/dL (ref 6–20)
CO2: 26 mmol/L (ref 22–32)
Calcium: 9.3 mg/dL (ref 8.9–10.3)
Chloride: 103 mmol/L (ref 98–111)
Creatinine: 0.96 mg/dL (ref 0.61–1.24)
GFR, Estimated: >60 mL/min (ref >60)
Glucose, Bld: 198 mg/dL — ABNORMAL HIGH (ref 70–99)
Potassium: 4.3 mmol/L (ref 3.5–5.1)
Sodium: 137 mmol/L (ref 135–145)
Total Bilirubin: 0.5 mg/dL (ref 0.0–1.2)
Total Protein: 7.7 g/dL (ref 6.5–8.1)

## 2024-04-18 LAB — CBC WITH DIFFERENTIAL (CANCER CENTER ONLY)
Abs Immature Granulocytes: 0.01 10*3/uL (ref 0.00–0.07)
Basophils Absolute: 0 10*3/uL (ref 0.0–0.1)
Basophils Relative: 1 %
Eosinophils Absolute: 0.1 10*3/uL (ref 0.0–0.5)
Eosinophils Relative: 5 %
HCT: 34.8 % — ABNORMAL LOW (ref 39.0–52.0)
Hemoglobin: 12.2 g/dL — ABNORMAL LOW (ref 13.0–17.0)
Immature Granulocytes: 0 %
Lymphocytes Relative: 16 %
Lymphs Abs: 0.4 10*3/uL — ABNORMAL LOW (ref 0.7–4.0)
MCH: 30.5 pg (ref 26.0–34.0)
MCHC: 35.1 g/dL (ref 30.0–36.0)
MCV: 87 fL (ref 80.0–100.0)
Monocytes Absolute: 0.2 10*3/uL (ref 0.1–1.0)
Monocytes Relative: 9 %
Neutro Abs: 1.6 10*3/uL — ABNORMAL LOW (ref 1.7–7.7)
Neutrophils Relative %: 69 %
Platelet Count: 28 10*3/uL — ABNORMAL LOW (ref 150–400)
RBC: 4 MIL/uL — ABNORMAL LOW (ref 4.22–5.81)
RDW: 13.8 % (ref 11.5–15.5)
WBC Count: 2.3 10*3/uL — ABNORMAL LOW (ref 4.0–10.5)
nRBC: 0 % (ref 0.0–0.2)

## 2024-04-18 LAB — SAMPLE TO BLOOD BANK

## 2024-04-18 LAB — IRON AND IRON BINDING CAPACITY (CC-WL,HP ONLY)
Iron: 93 ug/dL (ref 45–182)
Saturation Ratios: 23 % (ref 17.9–39.5)
TIBC: 407 ug/dL (ref 250–450)
UIBC: 314 ug/dL (ref 117–376)

## 2024-04-18 LAB — FOLATE: Folate: 8.9 ng/mL (ref >5.9)

## 2024-04-18 LAB — VITAMIN B12: Vitamin B-12: 537 pg/mL (ref 180–914)

## 2024-04-18 NOTE — Progress Notes (Signed)
 [facility] [phone]   [phone]  PROGRESS NOTE  Patient Care Team: Azalee Powell BIRCH, PA-C as PCP - General (Family Medicine) Lonni Slain, MD as PCP - Cardiology (Cardiology)  Hematological/Oncological History # Pancytopenia 1) 12/23/2018: WBC 8.7, Hgb 11.8, Plt 97. Admitted for chest wall abscess.  2) 12/25/2018: WBC 3.9, hgb 9.5, Plt 75 3) 01/11/2019: WBC 2.3, Hgb 10.1, Plt 42 4) 01/18/2019: WBC 1.8, Hgb 9.3, Plt 55. Discharged 01/21/2019 5) 09/20/2019: Establish care with Dr. Federico. WBC 3.4, Hgb 11.8, MCV 87.6, Plt 35  6)  04/12/2020: WBC 2.1, Hgb 11.8, MCV 92, Plt 43.  7) 04/30/2020: WBC 2.8, Hgb 12.1, MCV 89.5, Plt 46  Interval History:  Chase Reed 58 y.o. 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 The patient's last visit was on 02/16/2024. In the interim since the last visit he was hospitalized with acute worsening of his pancytopenia in April 2025.  On exam today Chase Reed notes he felt a good boost in energy when he received the 3 blood transfusions and iron  in the hospital.  He reports that 1 month after the treatment his erections came back.  He notes his energy today is about a 7 out of 10 after having reached a 9 out of 10 after his hospitalization.  He reports he has no energy to get through the day but it does slow him down.  He has been taking his iron  pills for a few weeks as well as his vitamin B12.  He has not had any overt signs of bleeding, bruising, or dark stools.  He said no recent infectious symptoms such as fevers, chills, sweats, nausea, vomiting or diarrhea.  He notes that he has unintentionally lost about 8 pounds worth of weight.  Otherwise he has no questions concerns or complaints today.  A full 10 point ROS is otherwise negative.   MEDICAL HISTORY:  Past Medical History:  Diagnosis Date   Chronic idiopathic  thrombocytopenia (HCC)    Chronic neutropenia (HCC)    Cirrhosis (HCC)    DM (diabetes mellitus) (HCC)    Hepatitis     SURGICAL HISTORY: Past Surgical History:  Procedure Laterality Date   APPLICATION OF WOUND VAC N/A 12/24/2018   Procedure: APPLICATION OF WOUND VAC;  Surgeon: Lucas Dorise POUR, MD;  Location: [facility] OR;  Service: Thoracic;  Laterality: N/A;   APPLICATION OF WOUND VAC N/A 12/27/2018   Procedure: WOUND VAC CHANGE;  Surgeon: Lucas Dorise POUR, MD;  Location: [facility] OR;  Service: Thoracic;  Laterality: N/A;   APPLICATION OF WOUND VAC N/A 01/14/2019   Procedure: APPLICATION OF WOUND VAC;  Surgeon: Lucas Dorise POUR, MD;  Location: [facility] OR;  Service: Vascular;  Laterality: N/A;   I & D EXTREMITY N/A 01/14/2019   Procedure: DEBRIDEMENT OF STERNOCLAVICULAR JOINT;  Surgeon: Lucas Dorise POUR, MD;  Location: [facility] OR;  Service: Vascular;  Laterality: N/A;   I & D EXTREMITY Right 10/03/2020   Procedure: Irrigation debridement of RIGHT forearm subfascial abscess measuring approximately 6 x 3 x 1.5 cm;  Surgeon: Carolee Lynwood JINNY DOUGLAS, MD;  Location: [facility] OR;  Service: Orthopedics;  Laterality: Right;   LITHOTRIPSY     STERNAL WOUND DEBRIDEMENT N/A 12/24/2018   Procedure: I&D CHEST WALL ABSCESS;  Surgeon: Lucas Dorise POUR, MD;  Location: [facility];  Service: Thoracic;  Laterality: N/A;   STERNAL WOUND DEBRIDEMENT N/A 12/27/2018   Procedure: DEBRIDEMENT  OF CHEST WALL WOUND;  Surgeon: Lucas Dorise POUR, MD;  Location: [facility];  Service: Thoracic;  Laterality: N/A;   TEE WITHOUT CARDIOVERSION N/A 01/03/2019   Procedure: TRANSESOPHAGEAL ECHOCARDIOGRAM (TEE);  Surgeon: Francyne Headland, MD;  Location: [facility] ENDOSCOPY;  Service: Cardiovascular;  Laterality: N/A;    SOCIAL HISTORY: Social History   Socioeconomic History   Marital status: Married    Spouse name: Not on file   Number of children: Not on file   Years of education: Not on file   Highest education level: Not on file  Occupational History   Occupation: Building  Education administrator  Tobacco Use   Smoking status: Some Days    Current packs/day: 0.25    Average packs/day: 0.3 packs/day for 30.0 years (7.5 ttl pk-yrs)    Types: Cigarettes   Smokeless tobacco: Former  Building services engineer status: Never Used  Substance and Sexual Activity   Alcohol use: No   Drug use: Not Currently    Types: Heroin, IV    Comment: Fentanyl  and Oxy per patient    Sexual activity: Never  Other Topics Concern   Not on file  Social History Narrative   Not on file   Social Drivers of Health   Financial Resource Strain: Low Risk  (04/13/2023)   Received from [facility]   Overall Financial Resource Strain (CARDIA)    Difficulty of Paying Living Expenses: Not hard at all  Food Insecurity: No Food Insecurity (02/01/2024)   Hunger Vital Sign    Worried About Running Out of Food in the Last Year: Never true    Ran Out of Food in the Last Year: Never true  Transportation Needs: No Transportation Needs (02/03/2024)   PRAPARE - Administrator, Civil Service (Medical): No    Lack of Transportation (Non-Medical): No  Recent Concern: Transportation Needs - Unmet Transportation Needs (02/01/2024)   PRAPARE - Administrator, Civil Service (Medical): Yes    Lack of Transportation (Non-Medical): Yes  Physical Activity: Not on file  Stress: Not on file  Social Connections: Unknown (03/04/2022)   Received from [facility]   Social Network    Social Network: Not on file  Intimate Partner Violence: Not At Risk (02/01/2024)   Humiliation, Afraid, Rape, and Kick questionnaire    Fear of Current or Ex-Partner: No    Emotionally Abused: No    Physically Abused: No    Sexually Abused: No    FAMILY HISTORY: Family History  Problem Relation Age of Onset   COPD Father     ALLERGIES:  is allergic to atorvastatin .  MEDICATIONS:  Current Outpatient Medications  Medication Sig Dispense Refill   cyanocobalamin  1000 MCG tablet Take 1 tablet (1,000 mcg total) by  mouth daily. 30 tablet 0   insulin  aspart (NOVOLOG ) 100 UNIT/ML injection Inject 2-8 Units into the skin See admin instructions. Take 2-8 units as needed  Sliding scale.over 250--2 units over 300--4 units over 350--6 units over 400--8 units Up to 50 units daily: For diabetes management (Patient taking differently: Inject 2-8 Units into the skin See admin instructions. Inject 2-8 units into the skin one to four times a day as needed for elevated BGL, PER SLIDING SCALE- BGL over 250 = 2 units; over 300 = 4 units; over 350 = 6 units; over 400 = 8 units (Up to 50 units daily: For diabetes management))     potassium chloride  (KLOR-CON ) 10 MEQ tablet Take 1 tablet (10 mEq  total) by mouth as needed. For severe leg cramps. 30 tablet 3   sertraline  (ZOLOFT ) 100 MG tablet Take 100 mg by mouth daily.     doxycycline  (VIBRAMYCIN ) 100 MG capsule Take 1 capsule (100 mg total) by mouth 2 (two) times daily. One po bid x 7 days (Patient not taking: Reported on 04/18/2024) 14 capsule 0   ferrous sulfate  325 (65 FE) MG tablet Take 1 tablet (325 mg total) by mouth daily with breakfast. 90 tablet 1   gabapentin  (NEURONTIN ) 600 MG tablet Take 600 mg by mouth 4 (four) times daily. (Patient not taking: Reported on 04/18/2024)     oxyCODONE  (ROXICODONE ) 5 MG immediate release tablet Take 0.5-1 tablets (2.5-5 mg total) by mouth every 6 (six) hours as needed for severe pain (pain score 7-10). (Patient not taking: Reported on 04/18/2024) 10 tablet 0   No current facility-administered medications for this visit.    REVIEW OF SYSTEMS:   Constitutional: ( - ) fevers, ( - )  chills , ( - ) night sweats Eyes: ( - ) blurriness of vision, ( - ) double vision, ( - ) watery eyes Ears, nose, mouth, throat, and face: ( - ) mucositis, ( - ) sore throat Respiratory: ( - ) cough, ( - ) dyspnea, ( - ) wheezes Cardiovascular: ( - ) palpitation, ( - ) chest discomfort, ( - ) lower extremity swelling Gastrointestinal:  ( - ) nausea, ( - )  heartburn, ( - ) change in bowel habits Skin: ( - ) abnormal skin rashes Lymphatics: ( - ) new lymphadenopathy, ( - ) easy bruising Neurological: ( - ) numbness, ( - ) tingling, ( - ) new weaknesses Behavioral/Psych: ( - ) mood change, ( - ) new changes  All other systems were reviewed with the patient and are negative.  PHYSICAL EXAMINATION: ECOG PERFORMANCE STATUS: 1 - Symptomatic but completely ambulatory  Vitals:   04/18/24 1446  BP: 138/89  Pulse: 64  Resp: 16  Temp: 98.4 F (36.9 C)  SpO2: 97%    Filed Weights   04/18/24 1446  Weight: 199 lb 4.8 oz (90.4 kg)     GENERAL: well appearing middle aged Caucasian alert, no distress and comfortable SKIN: skin color, texture, turgor are normal, no rashes or significant lesions EYES: conjunctiva are pink and non-injected, sclera clear OROPHARYNX: no exudate, no erythema; lips, buccal mucosa, and tongue normal. Mild oozing of blood from right lower molar site.  LUNGS: clear to auscultation and percussion with normal breathing effort HEART: regular rate & rhythm and no murmurs and no lower extremity edema Musculoskeletal: no cyanosis of digits and no clubbing  PSYCH: alert & oriented x 3, fluent speech NEURO: no focal motor/sensory deficits  LABORATORY DATA:  I have reviewed the data as listed    Latest Ref Rng & Units 04/18/2024    2:15 PM 02/16/2024   10:09 AM 02/15/2024    6:44 PM  CBC  WBC 4.0 - 10.5 K/uL 2.3  2.4  2.6   Hemoglobin 13.0 - 17.0 g/dL 87.7  9.8  89.6   Hematocrit 39.0 - 52.0 % 34.8  31.4  33.0   Platelets 150 - 400 K/uL 28  35  35        Latest Ref Rng & Units 04/18/2024    2:15 PM 02/16/2024   10:09 AM 02/15/2024    6:44 PM  CMP  Glucose 70 - 99 mg/dL 801  837  851   BUN 6 - 20  mg/dL 14  19  20    Creatinine 0.61 - 1.24 mg/dL 9.03  8.97  9.03   Sodium 135 - 145 mmol/L 137  138  137   Potassium 3.5 - 5.1 mmol/L 4.3  4.6  4.0   Chloride 98 - 111 mmol/L 103  106  101   CO2 22 - 32 mmol/L 26  26  28     Calcium  8.9 - 10.3 mg/dL 9.3  9.0  9.1   Total Protein 6.5 - 8.1 g/dL 7.7  7.6  7.8   Total Bilirubin 0.0 - 1.2 mg/dL 0.5  0.5  0.5   Alkaline Phos 38 - 126 U/L 99  101  96   AST 15 - 41 U/L 29  20  19    ALT 0 - 44 U/L 33  15  16     RADIOGRAPHIC STUDIES: No results found.   ASSESSMENT & PLAN Chase Reed 58 y.o. male with medical history significant for cirrhosis 2/2 to Hep C, DM type II, and polysubstance abuse who presents for follow up to evaluate of longstanding pancytopenia.    Pancytopenia in the setting of cirrhosis is common, particularly when splenomegaly is present as well ( Can J Gastroenterol. 2009;23(6):441-445.). This patient has marked splenomegaly, with a spleen measuring 21.4 cm and containing 1L of volume. In this case the body is sequestering cells of all types in the spleen, causing the apparent circulating value to appear quite low. Additionally the liver produces thrombopoietin, which stimulates the marrow to produce platelets. When the liver functions is impaired thrombopoietin decreases, resulting in decreased platelets levels. Unfortunately there is no definitive treatment for splenomegaly causes by cirrhosis (due to increased portal pressure). TIPS and beta blockers to decrease portal pressure have been shown to help in some cases, however cytopenias alone are not an indication for the procedure.   During his last visit the patient had an extensive workup including an attritional evaluation, review the peripheral blood film, and viral work-up revealed no other possible etiologies for the patient's pancytopenia.  In the interim since her last visit the patient has been treated by GI for his hepatitis C.    In the event the patient was in a car procedure is having issues with bleeding we would happily see him back for the consideration of TPO mimetic therapy.  #Pancytopenia in the Setting of Cirrhosis -- Patient had an acute drop while hospitalized in late  March/April 2025.  Etiology of the drop is unclear. --Underwent a bone marrow biopsy which showed no evidence of underlying bone marrow dysfunction --Found to have borderline low levels of vitamin B12 and low iron , now on p.o. supplementation for both. --labs today show white blood cell 2.3, hemoglobin 12.2, MCV 87, platelets 28  --HIV negative, known Hep C positive with cirrhosis --the patient's pancytopenia is most consistent with massive splenomegaly and cirrhosis. Splenomegaly (substantial, noted at 1L of volume (nml 250cc) and 21.4 cm)) in Feb 2020.  -- in the event his Plt count were to drop <50 and he were to require a procedure we can potentially treat with avatrombopag to help elevate these levels.  --RTC in 3 months to assure levels are improving on PO supplementation.   #Normocytic Anemia -- Recent massive drop concerning for bleed.  Found to be iron  deficient. -- Currently on ferrous sulfate  325 mg p.o. daily. -- Strict return precautions for any signs or symptoms concerning for bleeding -- Plan to see the patient back in 3 months time in  order to assure p.o. supplementation is adequate.  If not we will provide patient with IV iron  therapy.  #Cirrhosis 2/2 to Hepatitis C --patient of GI at [facility]  --patient has completed Hep C antiviral treatment --defer management of cirrhosis and Hep C GI center   #IV Drug Use #Mental Health --continue to monitor  No orders of the defined types were placed in this encounter.   All questions were answered. The patient knows to call the clinic with any problems, questions or concerns.  A total of more than 30 minutes were spent on this encounter and over half of that time was spent on counseling and coordination of care as outlined above.   Norleen IVAR Kidney, MD Department of Hematology/Oncology [facility] at [facility] Phone: [phone] Pager: [phone] Email: [email or web address]  04/27/2024  10:34 AM

## 2024-04-19 LAB — FERRITIN: Ferritin: 37 ng/mL (ref 24–336)

## 2024-04-21 LAB — METHYLMALONIC ACID, SERUM: Methylmalonic Acid, Quantitative: 177 nmol/L (ref 0–378)

## 2024-04-27 MED ORDER — FERROUS SULFATE 325 (65 FE) MG PO TABS: 325.0000 mg | ORAL_TABLET | Freq: Every day | ORAL | 1 refills | Status: DC

## 2024-04-28 ENCOUNTER — Ambulatory Visit: Payer: Self-pay

## 2024-04-28 NOTE — Telephone Encounter (Signed)
 Reviewed all  lab results with pt with verbal understanding and 3 month f/up appt confirmed

## 2024-04-28 NOTE — Telephone Encounter (Signed)
-----   Message from Nurse Almarie T sent at 04/28/2024  3:23 PM EDT -----  ----- Message ----- From: Federico Norleen ONEIDA MADISON, MD Sent: 04/27/2024  10:35 AM EDT To: Almarie DELENA Arabia, RN  Please let Mr. Semel know that his labs look good.  His ferritin is 37, no indication for IV iron  therapy however I would recommend he continue p.o. iron  therapy.  Refill was called into his  pharmacy.  Additionally recommend he continue his vitamin B12.  Overall his levels look improved from his hospitalization.  Will plan to see him back as scheduled in 3 months time.  ----- Message ----- From: Rebecka, Lab In Imogene Sent: 04/18/2024   2:42 PM EDT To: Norleen ONEIDA Federico MADISON, MD

## 2024-05-05 ENCOUNTER — Other Ambulatory Visit: Payer: Self-pay

## 2024-05-05 MED ORDER — POTASSIUM CHLORIDE ER 10 MEQ PO TBCR: 10.0000 meq | EXTENDED_RELEASE_TABLET | ORAL | 31 refills | Status: AC | PRN

## 2024-06-14 ENCOUNTER — Ambulatory Visit (HOSPITAL_BASED_OUTPATIENT_CLINIC_OR_DEPARTMENT_OTHER): Payer: MEDICAID | Admitting: Cardiology

## 2024-06-14 NOTE — Progress Notes (Incomplete)
 Cardiology Office Note:  .   Date:  06/14/2024  ID:  Chase Reed, DOB [birth year], MRN [id number] PCP: Azalee Powell BIRCH PA-C  [facility] Providers Cardiologist:  Shelda Bruckner, MD {  History of Present Illness: .   Chase Reed is a 58 y.o. male with a hx of type II diabetes, chronic hepatitis C on epclusa  with cirrhosis,  who is seen for follow-up. I initially met him 07/01/2021 as a new consult at the request of Azalee Powell BIRCH,* for the evaluation and management of syncope   Today: Since our last visit 03/2023, he was hospitalized for worsening pancytopenia in 02/2024. He was seen by hematology. Etiology of this felt to be 2/2 cirrhosis and splenomegaly; no bone marrow dysfunction on biopsy.  ROS: Denies chest pain, shortness of breath at rest or with normal exertion. No PND, orthopnea, LE edema or unexpected weight gain. No syncope or palpitations. ROS otherwise negative except as noted.   Studies Reviewed: SABRA    EKG:       Physical Exam:   VS:  There were no vitals taken for this visit.   Wt Readings from Last 3 Encounters:  04/18/24 199 lb 4.8 oz (90.4 kg)  02/16/24 207 lb 11.2 oz (94.2 kg)  02/01/24 215 lb (97.5 kg)    GEN: Well nourished, well developed in no acute distress HEENT: Normal, moist mucous membranes NECK: No JVD CARDIAC: regular rhythm, normal S1 and S2, no rubs or gallops. No murmur. VASCULAR: Radial and DP pulses 2+ bilaterally. No carotid bruits RESPIRATORY:  Clear to auscultation without rales, wheezing or rhonchi  ABDOMEN: Soft, non-tender, non-distended MUSCULOSKELETAL:  Ambulates independently SKIN: Warm and dry, no edema NEUROLOGIC:  Alert and oriented x 3. No focal neuro deficits noted. PSYCHIATRIC:  Normal affect    ASSESSMENT AND PLAN: .    Dizziness -some are clearly vertigo (looking up and room spins), others are nonspecific -no syncope   Palpitations -not related to his syncope -discussed options for  further evaluation. Will contact me if symptoms worsen   Atypical chest pain -reviewed red flag warning signs that need immediate medical attention   Murmur -very soft today, asymptomatic   History of syncope/hypotension Hypertension  -echocardiogram (last 2020) reordered, not performed -blood pressures now running more low than high. He almost never takes the lisinopril . With low BP, will discontinue lisinopril  on his list. Has stopped taking amlodipine . On no antihypertensives today. -given diabetes, may have a component of autonomic dysfunction -recommended slow position changes.   History of recurrent infections, chest wounds -last debridement of sternal wounds 01/2019   Type II diabetes -last A1c 5.3 per [facility] -insulin  per PCP, on sitagliptin  as well -would typically recommend statin/aspirin , but given cirrhosis and pancytopenia, will avoid these   History of hepatitis C Pancytopenia History of cirrhosis -S/P epclusa   CV risk counseling and prevention -recommend heart healthy/Mediterranean diet, with whole grains, fruits, vegetable, fish, lean meats, nuts, and olive oil. Limit salt. -recommend moderate walking, 3-5 times/week for 30-50 minutes each session. Aim for at least 150 minutes.week. Goal should be pace of 3 miles/hours, or walking 1.5 miles in 30 minutes -recommend avoidance of tobacco products. Avoid excess alcohol. -ASCVD risk score: The 10-year ASCVD risk score (Arnett DK, et al., 2019) is: 14.7%   Values used to calculate the score:     Age: 69 years     Clincally relevant sex: Male     Is Non-Hispanic African American: No  Diabetic: Yes     Tobacco smoker: Yes     Systolic Blood Pressure: 138 mmHg     Is BP treated: No     HDL Cholesterol: 51 mg/dL     Total Cholesterol: 142 mg/dL    Dispo: ***  Signed, Shelda Bruckner, MD   Shelda Bruckner, MD, PhD, [facility]  [facility]  [place]  [facility] at [facility] at [facility] [address] [place], [place] [address] [phone]

## 2024-07-19 ENCOUNTER — Other Ambulatory Visit: Payer: Self-pay | Admitting: Physician Assistant

## 2024-07-19 DIAGNOSIS — D649 Anemia, unspecified: Secondary | ICD-10-CM

## 2024-07-20 ENCOUNTER — Inpatient Hospital Stay: Payer: MEDICAID

## 2024-07-20 ENCOUNTER — Inpatient Hospital Stay: Payer: MEDICAID | Admitting: Physician Assistant

## 2024-07-20 ENCOUNTER — Telehealth: Payer: Self-pay | Admitting: Hematology and Oncology

## 2024-08-04 NOTE — Progress Notes (Signed)
 [facility] [phone]   [phone]  PROGRESS NOTE  Patient Care Team: Azalee Powell BIRCH, PA-C as PCP - General (Family Medicine) Lonni Slain, MD as PCP - Cardiology (Cardiology)  Hematological/Oncological History # Pancytopenia 1) 12/23/2018: WBC 8.7, Hgb 11.8, Plt 97. Admitted for chest wall abscess.  2) 12/25/2018: WBC 3.9, hgb 9.5, Plt 75 3) 01/11/2019: WBC 2.3, Hgb 10.1, Plt 42 4) 01/18/2019: WBC 1.8, Hgb 9.3, Plt 55. Discharged 01/21/2019 5) 09/20/2019: Establish care with Dr. Federico. WBC 3.4, Hgb 11.8, MCV 87.6, Plt 35  6)  04/12/2020: WBC 2.1, Hgb 11.8, MCV 92, Plt 43.  7) 04/30/2020: WBC 2.8, Hgb 12.1, MCV 89.5, Plt 46  Interval History:  Chase Reed 58 y.o. 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 The patient's last visit was on 04/18/2024. In the interim since the last visit he has had no major changes in his health.  On exam today Chase Reed notes he has been well overall in interim since her last visit and is feeling a lot better compared to his hospitalization earlier this year.  He reports his appetite is good and his energy levels are strong, though they do appear to be tapering off.  He reports he has no major plans this year with no upcoming surgeries or dental work.  He denies any overt signs of bleeding such as nosebleeds, gum bleeding, or dark stools.  He reports that he is eating plenty of red meat and is taking his iron  pills daily as prescribed.  He otherwise denies any fevers, chills, sweats, nausea, or diarrhea.  Full 10 point ROS is otherwise negative.  MEDICAL HISTORY:  Past Medical History:  Diagnosis Date   Chronic idiopathic thrombocytopenia (HCC)    Chronic neutropenia    Cirrhosis (HCC)    DM (diabetes mellitus) (HCC)    Hepatitis     SURGICAL HISTORY: Past Surgical History:  Procedure Laterality Date   APPLICATION OF  WOUND VAC N/A 12/24/2018   Procedure: APPLICATION OF WOUND VAC;  Surgeon: Lucas Dorise POUR, MD;  Location: [facility];  Service: Thoracic;  Laterality: N/A;   APPLICATION OF WOUND VAC N/A 12/27/2018   Procedure: WOUND VAC CHANGE;  Surgeon: Lucas Dorise POUR, MD;  Location: [facility] OR;  Service: Thoracic;  Laterality: N/A;   APPLICATION OF WOUND VAC N/A 01/14/2019   Procedure: APPLICATION OF WOUND VAC;  Surgeon: Lucas Dorise POUR, MD;  Location: [facility] OR;  Service: Vascular;  Laterality: N/A;   I & D EXTREMITY N/A 01/14/2019   Procedure: DEBRIDEMENT OF STERNOCLAVICULAR JOINT;  Surgeon: Lucas Dorise POUR, MD;  Location: [facility] OR;  Service: Vascular;  Laterality: N/A;   I & D EXTREMITY Right 10/03/2020   Procedure: Irrigation debridement of RIGHT forearm subfascial abscess measuring approximately 6 x 3 x 1.5 cm;  Surgeon: Carolee Lynwood JINNY DOUGLAS, MD;  Location: [facility] OR;  Service: Orthopedics;  Laterality: Right;   LITHOTRIPSY     STERNAL WOUND DEBRIDEMENT N/A 12/24/2018   Procedure: I&D CHEST WALL ABSCESS;  Surgeon: Lucas Dorise POUR, MD;  Location: [facility];  Service: Thoracic;  Laterality: N/A;   STERNAL WOUND DEBRIDEMENT N/A 12/27/2018   Procedure: DEBRIDEMENT OF CHEST WALL WOUND;  Surgeon: Lucas Dorise POUR, MD;  Location: [facility];  Service: Thoracic;  Laterality: N/A;   TEE WITHOUT CARDIOVERSION N/A 01/03/2019   Procedure: TRANSESOPHAGEAL ECHOCARDIOGRAM (TEE);  Surgeon: Francyne Headland, MD;  Location: [facility];  Service: Cardiovascular;  Laterality: N/A;    SOCIAL HISTORY: Social History   Socioeconomic History   Marital status: Married    Spouse name: Not on file   Number of children: Not on file   Years of education: Not on file   Highest education level: Not on file  Occupational History   Occupation: Building Education administrator  Tobacco Use   Smoking status: Some Days    Current packs/day: 0.25    Average packs/day: 0.3 packs/day for 30.0 years (7.5 ttl pk-yrs)    Types: Cigarettes   Smokeless tobacco: Former  Haematologist status: Never Used  Substance and Sexual Activity   Alcohol use: No   Drug use: Not Currently    Types: Heroin, IV    Comment: Fentanyl  and Oxy per patient    Sexual activity: Never  Other Topics Concern   Not on file  Social History Narrative   Not on file   Social Drivers of Health   Financial Resource Strain: Low Risk  (04/13/2023)   Received from [facility]   Overall Financial Resource Strain (CARDIA)    Difficulty of Paying Living Expenses: Not hard at all  Food Insecurity: No Food Insecurity (02/01/2024)   Hunger Vital Sign    Worried About Running Out of Food in the Last Year: Never true    Ran Out of Food in the Last Year: Never true  Transportation Needs: No Transportation Needs (02/03/2024)   PRAPARE - Administrator, Civil Service (Medical): No    Lack of Transportation (Non-Medical): No  Recent Concern: Transportation Needs - Unmet Transportation Needs (02/01/2024)   PRAPARE - Administrator, Civil Service (Medical): Yes    Lack of Transportation (Non-Medical): Yes  Physical Activity: Not on file  Stress: Not on file  Social Connections: Unknown (03/04/2022)   Received from [facility]   Social Network    Social Network: Not on file  Intimate Partner Violence: Not At Risk (02/01/2024)   Humiliation, Afraid, Rape, and Kick questionnaire    Fear of Current or Ex-Partner: No    Emotionally Abused: No    Physically Abused: No    Sexually Abused: No    FAMILY HISTORY: Family History  Problem Relation Age of Onset   COPD Father     ALLERGIES:  is allergic to atorvastatin .  MEDICATIONS:  Current Outpatient Medications  Medication Sig Dispense Refill   cyanocobalamin  1000 MCG tablet Take 1 tablet (1,000 mcg total) by mouth daily. 30 tablet 0   doxycycline  (VIBRAMYCIN ) 100 MG capsule Take 1 capsule (100 mg total) by mouth 2 (two) times daily. One po bid x 7 days (Patient not taking: Reported on 04/18/2024) 14 capsule 0   ferrous  sulfate 325 (65 FE) MG tablet Take 1 tablet (325 mg total) by mouth daily with breakfast. 90 tablet 3   gabapentin  (NEURONTIN ) 600 MG tablet Take 600 mg by mouth 4 (four) times daily. (Patient not taking: Reported on 04/18/2024)     insulin  aspart (NOVOLOG ) 100 UNIT/ML injection Inject 2-8 Units into the skin See admin instructions. Take 2-8 units as needed  Sliding scale.over 250--2 units over 300--4 units over 350--6 units over 400--8 units Up to 50 units daily: For diabetes management (Patient taking differently: Inject 2-8 Units into the skin See admin instructions. Inject 2-8 units into the skin one to four times a day as needed for elevated BGL, PER SLIDING SCALE- BGL over 250 = 2 units;  over 300 = 4 units; over 350 = 6 units; over 400 = 8 units (Up to 50 units daily: For diabetes management))     oxyCODONE  (ROXICODONE ) 5 MG immediate release tablet Take 0.5-1 tablets (2.5-5 mg total) by mouth every 6 (six) hours as needed for severe pain (pain score 7-10). (Patient not taking: Reported on 04/18/2024) 10 tablet 0   potassium chloride  (KLOR-CON ) 10 MEQ tablet Take 1 tablet (10 mEq total) by mouth as needed. For severe leg cramps. 30 tablet 31   sertraline  (ZOLOFT ) 100 MG tablet Take 100 mg by mouth daily.     No current facility-administered medications for this visit.    REVIEW OF SYSTEMS:   Constitutional: ( - ) fevers, ( - )  chills , ( - ) night sweats Eyes: ( - ) blurriness of vision, ( - ) double vision, ( - ) watery eyes Ears, nose, mouth, throat, and face: ( - ) mucositis, ( - ) sore throat Respiratory: ( - ) cough, ( - ) dyspnea, ( - ) wheezes Cardiovascular: ( - ) palpitation, ( - ) chest discomfort, ( - ) lower extremity swelling Gastrointestinal:  ( - ) nausea, ( - ) heartburn, ( - ) change in bowel habits Skin: ( - ) abnormal skin rashes Lymphatics: ( - ) new lymphadenopathy, ( - ) easy bruising Neurological: ( - ) numbness, ( - ) tingling, ( - ) new  weaknesses Behavioral/Psych: ( - ) mood change, ( - ) new changes  All other systems were reviewed with the patient and are negative.  PHYSICAL EXAMINATION: ECOG PERFORMANCE STATUS: 1 - Symptomatic but completely ambulatory  Vitals:   08/08/24 0819  BP: (!) 143/96  Pulse: 75  Resp: 17  Temp: 97.6 F (36.4 C)  SpO2: 98%     Filed Weights   08/08/24 0819  Weight: 210 lb (95.3 kg)      GENERAL: well appearing middle aged Caucasian alert, no distress and comfortable SKIN: skin color, texture, turgor are normal, no rashes or significant lesions EYES: conjunctiva are pink and non-injected, sclera clear OROPHARYNX: no exudate, no erythema; lips, buccal mucosa, and tongue normal. Mild oozing of blood from right lower molar site.  LUNGS: clear to auscultation and percussion with normal breathing effort HEART: regular rate & rhythm and no murmurs and no lower extremity edema Musculoskeletal: no cyanosis of digits and no clubbing  PSYCH: alert & oriented x 3, fluent speech NEURO: no focal motor/sensory deficits  LABORATORY DATA:  I have reviewed the data as listed    Latest Ref Rng & Units 08/08/2024    8:03 AM 04/18/2024    2:15 PM 02/16/2024   10:09 AM  CBC  WBC 4.0 - 10.5 K/uL 2.7  2.3  2.4   Hemoglobin 13.0 - 17.0 g/dL 86.2  87.7  9.8   Hematocrit 39.0 - 52.0 % 38.5  34.8  31.4   Platelets 150 - 400 K/uL 30  28  35        Latest Ref Rng & Units 08/08/2024    8:03 AM 04/18/2024    2:15 PM 02/16/2024   10:09 AM  CMP  Glucose 70 - 99 mg/dL 847  801  837   BUN 6 - 20 mg/dL 14  14  19    Creatinine 0.61 - 1.24 mg/dL 8.96  9.03  8.97   Sodium 135 - 145 mmol/L 137  137  138   Potassium 3.5 - 5.1 mmol/L 4.2  4.3  4.6  Chloride 98 - 111 mmol/L 104  103  106   CO2 22 - 32 mmol/L 25  26  26    Calcium  8.9 - 10.3 mg/dL 9.5  9.3  9.0   Total Protein 6.5 - 8.1 g/dL 8.1  7.7  7.6   Total Bilirubin 0.0 - 1.2 mg/dL 0.5  0.5  0.5   Alkaline Phos 38 - 126 U/L 100  99  101   AST 15 -  41 U/L 22  29  20    ALT 0 - 44 U/L 24  33  15     RADIOGRAPHIC STUDIES: No results found.   ASSESSMENT & PLAN Chase Reed 58 y.o. male with medical history significant for cirrhosis 2/2 to Hep C, DM type II, and polysubstance abuse who presents for follow up to evaluate of longstanding pancytopenia.    Pancytopenia in the setting of cirrhosis is common, particularly when splenomegaly is present as well ( Can J Gastroenterol. 2009;23(6):441-445.). This patient has marked splenomegaly, with a spleen measuring 21.4 cm and containing 1L of volume. In this case the body is sequestering cells of all types in the spleen, causing the apparent circulating value to appear quite low. Additionally the liver produces thrombopoietin, which stimulates the marrow to produce platelets. When the liver functions is impaired thrombopoietin decreases, resulting in decreased platelets levels. Unfortunately there is no definitive treatment for splenomegaly causes by cirrhosis (due to increased portal pressure). TIPS and beta blockers to decrease portal pressure have been shown to help in some cases, however cytopenias alone are not an indication for the procedure.   During his last visit the patient had an extensive workup including an attritional evaluation, review the peripheral blood film, and viral work-up revealed no other possible etiologies for the patient's pancytopenia.  In the interim since her last visit the patient has been treated by GI for his hepatitis C.    In the event the patient was in a car procedure is having issues with bleeding we would happily see him back for the consideration of TPO mimetic therapy.  #Pancytopenia in the Setting of Cirrhosis -- Patient had an acute drop while hospitalized in late March/April 2025.  Etiology of the drop is unclear. --Underwent a bone marrow biopsy which showed no evidence of underlying bone marrow dysfunction --Found to have borderline low levels of vitamin  B12 and low iron , now on p.o. supplementation for both. --labs today show white blood cell 2.7, Hgb 13.7, MCV 87.7, Plt 30  --HIV negative, known Hep C positive with cirrhosis --the patient's pancytopenia is most consistent with massive splenomegaly and cirrhosis. Splenomegaly (substantial, noted at 1L of volume (nml 250cc) and 21.4 cm)) in Feb 2020.  -- in the event his Plt count were to drop <50 and he were to require a procedure we can potentially treat with avatrombopag to help elevate these levels.  --RTC in 6 months to assure levels are improving on PO supplementation.   #Normocytic Anemia -- Recent massive drop concerning for bleed.  Found to be iron  deficient. -- Currently on ferrous sulfate  325 mg p.o. daily. -- Strict return precautions for any signs or symptoms concerning for bleeding -- Plan to see the patient back in 6 months time in order to assure his blood counts are stable.   #Cirrhosis 2/2 to Hepatitis C --patient of GI at [facility]  --patient has completed Hep C antiviral treatment --defer management of cirrhosis and Hep C [facility]   #IV Drug Use #Mental  Health --continue to monitor  No orders of the defined types were placed in this encounter.   All questions were answered. The patient knows to call the clinic with any problems, questions or concerns.  A total of more than 30 minutes were spent on this encounter and over half of that time was spent on counseling and coordination of care as outlined above.   Norleen IVAR Kidney, MD Department of Hematology/Oncology [facility] Phone: [phone] Pager: [phone] Email: [email or web address]  08/08/2024 8:56 AM

## 2024-08-08 ENCOUNTER — Inpatient Hospital Stay: Payer: MEDICAID | Attending: Hematology and Oncology

## 2024-08-08 ENCOUNTER — Inpatient Hospital Stay (HOSPITAL_BASED_OUTPATIENT_CLINIC_OR_DEPARTMENT_OTHER): Payer: MEDICAID | Admitting: Hematology and Oncology

## 2024-08-08 VITALS — BP 143/96 | HR 75 | Temp 97.6°F | Resp 17 | Ht 72.0 in | Wt 210.0 lb

## 2024-08-08 DIAGNOSIS — D649 Anemia, unspecified: Secondary | ICD-10-CM

## 2024-08-08 DIAGNOSIS — D500 Iron deficiency anemia secondary to blood loss (chronic): Secondary | ICD-10-CM

## 2024-08-08 DIAGNOSIS — D696 Thrombocytopenia, unspecified: Secondary | ICD-10-CM | POA: Diagnosis not present

## 2024-08-08 DIAGNOSIS — D61818 Other pancytopenia: Secondary | ICD-10-CM | POA: Insufficient documentation

## 2024-08-08 DIAGNOSIS — K7460 Unspecified cirrhosis of liver: Secondary | ICD-10-CM | POA: Diagnosis present

## 2024-08-08 DIAGNOSIS — F17210 Nicotine dependence, cigarettes, uncomplicated: Secondary | ICD-10-CM | POA: Diagnosis not present

## 2024-08-08 LAB — CBC WITH DIFFERENTIAL (CANCER CENTER ONLY)
Abs Immature Granulocytes: 0.01 K/uL (ref 0.00–0.07)
Basophils Absolute: 0 K/uL (ref 0.0–0.1)
Basophils Relative: 1 %
Eosinophils Absolute: 0.2 K/uL (ref 0.0–0.5)
Eosinophils Relative: 6 %
HCT: 38.5 % — ABNORMAL LOW (ref 39.0–52.0)
Hemoglobin: 13.7 g/dL (ref 13.0–17.0)
Immature Granulocytes: 0 %
Lymphocytes Relative: 17 %
Lymphs Abs: 0.5 K/uL — ABNORMAL LOW (ref 0.7–4.0)
MCH: 31.2 pg (ref 26.0–34.0)
MCHC: 35.6 g/dL (ref 30.0–36.0)
MCV: 87.7 fL (ref 80.0–100.0)
Monocytes Absolute: 0.2 K/uL (ref 0.1–1.0)
Monocytes Relative: 9 %
Neutro Abs: 1.8 K/uL (ref 1.7–7.7)
Neutrophils Relative %: 67 %
Platelet Count: 30 K/uL — ABNORMAL LOW (ref 150–400)
RBC: 4.39 MIL/uL (ref 4.22–5.81)
RDW: 13.6 % (ref 11.5–15.5)
WBC Count: 2.7 K/uL — ABNORMAL LOW (ref 4.0–10.5)
nRBC: 0 % (ref 0.0–0.2)

## 2024-08-08 LAB — CMP (CANCER CENTER ONLY)
ALT: 24 U/L (ref 0–44)
AST: 22 U/L (ref 15–41)
Albumin: 4.5 g/dL (ref 3.5–5.0)
Alkaline Phosphatase: 100 U/L (ref 38–126)
Anion gap: 8 (ref 5–15)
BUN: 14 mg/dL (ref 6–20)
CO2: 25 mmol/L (ref 22–32)
Calcium: 9.5 mg/dL (ref 8.9–10.3)
Chloride: 104 mmol/L (ref 98–111)
Creatinine: 1.03 mg/dL (ref 0.61–1.24)
GFR, Estimated: >60 mL/min (ref >60)
Glucose, Bld: 152 mg/dL — ABNORMAL HIGH (ref 70–99)
Potassium: 4.2 mmol/L (ref 3.5–5.1)
Sodium: 137 mmol/L (ref 135–145)
Total Bilirubin: 0.5 mg/dL (ref 0.0–1.2)
Total Protein: 8.1 g/dL (ref 6.5–8.1)

## 2024-08-08 LAB — IRON AND IRON BINDING CAPACITY (CC-WL,HP ONLY)
Iron: 75 ug/dL (ref 45–182)
Saturation Ratios: 16 % — ABNORMAL LOW (ref 17.9–39.5)
TIBC: 483 ug/dL — ABNORMAL HIGH (ref 250–450)
UIBC: 408 ug/dL — ABNORMAL HIGH (ref 117–376)

## 2024-08-08 LAB — FOLATE: Folate: 9.3 ng/mL (ref >5.9)

## 2024-08-08 LAB — FERRITIN: Ferritin: 38 ng/mL (ref 24–336)

## 2024-08-08 LAB — VITAMIN B12: Vitamin B-12: 738 pg/mL (ref 180–914)

## 2024-08-08 MED ORDER — FERROUS SULFATE 325 (65 FE) MG PO TABS: 325.0000 mg | ORAL_TABLET | Freq: Every day | ORAL | 3 refills | Status: AC

## 2024-08-14 LAB — METHYLMALONIC ACID, SERUM: Methylmalonic Acid, Quantitative: 139 nmol/L (ref 0–378)

## 2024-11-07 NOTE — Progress Notes (Incomplete)
" °  Cardiology Office Note:  .   Date:  11/07/2024  ID:  Chase Reed, DOB 1966/07/18, MRN [id number] PCP: Azalee Powell BIRCH PA-C  [facility] Providers Cardiologist:  Shelda Bruckner, MD {  History of Present Illness: .   Chase Reed is a 59 y.o. male with a hx of type II diabetes, chronic hepatitis C on epclusa  with cirrhosis,  who is seen for follow-up. I initially met him 07/01/2021 as a new consult at the request of Azalee Powell BIRCH,* for the evaluation and management of syncope.   Today: Admitted 02/2024 with severe anemia (Hgb 5), transfused and given IV iron . No bleeding noted, thought to be 2/2 splenomegaly and cirrhosis. Has been following with hematology  ROS: Denies chest pain, shortness of breath at rest or with normal exertion. No PND, orthopnea, LE edema or unexpected weight gain. No syncope or palpitations. ROS otherwise negative except as noted.   Studies Reviewed: SABRA    EKG:       Physical Exam:   VS:  There were no vitals taken for this visit.   Wt Readings from Last 3 Encounters:  08/08/24 210 lb (95.3 kg)  04/18/24 199 lb 4.8 oz (90.4 kg)  02/16/24 207 lb 11.2 oz (94.2 kg)    GEN: Well nourished, well developed in no acute distress HEENT: Normal, moist mucous membranes NECK: No JVD CARDIAC: regular rhythm, normal S1 and S2, no rubs or gallops. No murmur. VASCULAR: Radial and DP pulses 2+ bilaterally. No carotid bruits RESPIRATORY:  Clear to auscultation without rales, wheezing or rhonchi  ABDOMEN: Soft, non-tender, non-distended MUSCULOSKELETAL:  Ambulates independently SKIN: Warm and dry, no edema NEUROLOGIC:  Alert and oriented x 3. No focal neuro deficits noted. PSYCHIATRIC:  Normal affect    ASSESSMENT AND PLAN: .    Dizziness -some are clearly vertigo (looking up and room spins), others are nonspecific -no syncope   Palpitations -not related to his syncope -discussed options for further evaluation. Will contact me if  symptoms worsen   Atypical chest pain -reviewed red flag warning signs that need immediate medical attention   Murmur -very soft today, asymptomatic   History of syncope/hypotension Hypertension  -echocardiogram (last 2020) reordered, not performed -On no antihypertensives today. -given diabetes, may have a component of autonomic dysfunction -recommended slow position changes.   History of recurrent infections, chest wounds -last debridement of sternal wounds 01/2019   Type II diabetes -insulin  per PCP -would typically recommend statin/aspirin , but given cirrhosis and pancytopenia, will avoid these   History of hepatitis C Pancytopenia History of cirrhosis -S/P epclusa   CV risk counseling and prevention -recommend heart healthy/Mediterranean diet, with whole grains, fruits, vegetable, fish, lean meats, nuts, and olive oil. Limit salt. -recommend moderate walking, 3-5 times/week for 30-50 minutes each session. Aim for at least 150 minutes/week. Goal should be pace of 3 miles/hours, or walking 1.5 miles in 30 minutes -recommend avoidance of tobacco products. Avoid excess alcohol.  Dispo: ***  Signed, Shelda Bruckner, MD   Shelda Bruckner, MD, PhD, [facility] [place]  [facility]  [place]  Heart & Vascular at [facility] at [facility] [address] [place], [place] [address] [phone]   "

## 2024-11-08 ENCOUNTER — Ambulatory Visit (HOSPITAL_BASED_OUTPATIENT_CLINIC_OR_DEPARTMENT_OTHER): Payer: MEDICAID | Admitting: Cardiology

## 2024-11-18 ENCOUNTER — Telehealth: Payer: Self-pay | Admitting: Hematology and Oncology

## 2024-11-18 NOTE — Telephone Encounter (Signed)
Spoke to pt about rescheduling appt

## 2025-02-06 ENCOUNTER — Other Ambulatory Visit: Payer: MEDICAID

## 2025-02-06 ENCOUNTER — Ambulatory Visit: Payer: MEDICAID | Admitting: Hematology and Oncology

## 2025-02-09 ENCOUNTER — Inpatient Hospital Stay: Payer: MEDICAID | Admitting: Hematology and Oncology

## 2025-02-09 ENCOUNTER — Inpatient Hospital Stay: Payer: MEDICAID
